# Patient Record
Sex: Male | Born: 1970 | Race: Black or African American | Hispanic: No | Marital: Single | Smoking: Current every day smoker
Health system: Southern US, Community
[De-identification: ages and names within clinical notes are randomized; demographics above are authoritative.]

## PROBLEM LIST (undated history)

## (undated) ENCOUNTER — Encounter

## (undated) ENCOUNTER — Ambulatory Visit

## (undated) ENCOUNTER — Encounter: Attending: Oncology | Primary: Oncology

## (undated) ENCOUNTER — Telehealth: Attending: Internal Medicine | Primary: Internal Medicine

## (undated) ENCOUNTER — Encounter: Attending: Hematology | Primary: Hematology

## (undated) ENCOUNTER — Ambulatory Visit: Payer: MEDICARE | Attending: Hematology | Primary: Hematology

## (undated) ENCOUNTER — Telehealth: Attending: Adult Health | Primary: Adult Health

## (undated) ENCOUNTER — Inpatient Hospital Stay

## (undated) ENCOUNTER — Ambulatory Visit: Payer: MEDICARE

## (undated) ENCOUNTER — Telehealth: Attending: Nephrology | Primary: Nephrology

## (undated) ENCOUNTER — Telehealth

## (undated) ENCOUNTER — Telehealth: Attending: Hematology | Primary: Hematology

## (undated) ENCOUNTER — Telehealth: Attending: Oncology | Primary: Oncology

## (undated) ENCOUNTER — Institutional Professional Consult (permissible substitution): Payer: MEDICARE | Attending: Hematology | Primary: Hematology

## (undated) ENCOUNTER — Ambulatory Visit: Payer: MEDICARE | Attending: Pediatrics | Primary: Pediatrics

## (undated) ENCOUNTER — Ambulatory Visit
Payer: MEDICARE | Attending: Rehabilitative and Restorative Service Providers" | Primary: Rehabilitative and Restorative Service Providers"

## (undated) ENCOUNTER — Encounter: Attending: Adult Health | Primary: Adult Health

## (undated) ENCOUNTER — Ambulatory Visit: Payer: MEDICARE | Attending: Internal Medicine | Primary: Internal Medicine

## (undated) ENCOUNTER — Ambulatory Visit: Attending: Clinical | Primary: Clinical

## (undated) ENCOUNTER — Telehealth: Attending: Critical Care Medicine | Primary: Critical Care Medicine

## (undated) ENCOUNTER — Institutional Professional Consult (permissible substitution): Payer: MEDICARE

## (undated) ENCOUNTER — Telehealth
Attending: Student in an Organized Health Care Education/Training Program | Primary: Student in an Organized Health Care Education/Training Program

## (undated) DIAGNOSIS — R569 Unspecified convulsions: Secondary | ICD-10-CM

## (undated) MED ORDER — SODIUM POLYSTYRENE SULFONATE 15 GRAM/60 ML ORAL SUSPENSION: Freq: Every day | ORAL | 0.00000 days

---
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
## 2009-01-11 ENCOUNTER — Emergency Department: Payer: Self-pay | Admitting: Emergency Medicine

## 2009-07-02 ENCOUNTER — Emergency Department: Payer: Self-pay | Admitting: Emergency Medicine

## 2010-04-26 ENCOUNTER — Ambulatory Visit: Payer: Self-pay | Admitting: Internal Medicine

## 2017-03-08 ENCOUNTER — Ambulatory Visit
Admission: RE | Admit: 2017-03-08 | Discharge: 2017-03-08 | Disposition: A | Discharge status: Home / Self Care | Payer: MEDICAID

## 2017-03-08 DIAGNOSIS — I272 Pulmonary hypertension, unspecified: Principal | ICD-10-CM

## 2017-03-08 DIAGNOSIS — D571 Sickle-cell disease without crisis: Secondary | ICD-10-CM

## 2017-03-08 DIAGNOSIS — I50810 Right heart failure, unspecified: Secondary | ICD-10-CM

## 2017-04-01 ENCOUNTER — Ambulatory Visit: Admission: RE | Admit: 2017-04-01 | Discharge: 2017-04-01 | Disposition: A | Discharge status: Home / Self Care

## 2017-04-01 DIAGNOSIS — R0602 Shortness of breath: Secondary | ICD-10-CM

## 2017-04-01 DIAGNOSIS — D571 Sickle-cell disease without crisis: Principal | ICD-10-CM

## 2017-04-01 DIAGNOSIS — R569 Unspecified convulsions: Secondary | ICD-10-CM

## 2017-04-01 DIAGNOSIS — E559 Vitamin D deficiency, unspecified: Secondary | ICD-10-CM

## 2017-05-19 ENCOUNTER — Ambulatory Visit
Admission: RE | Admit: 2017-05-19 | Discharge: 2017-05-19 | Disposition: A | Discharge status: Home / Self Care | Payer: MEDICAID | Admitting: Internal Medicine

## 2017-05-19 DIAGNOSIS — R809 Proteinuria, unspecified: Principal | ICD-10-CM

## 2017-07-01 ENCOUNTER — Ambulatory Visit
Admission: RE | Admit: 2017-07-01 | Discharge: 2017-07-01 | Disposition: A | Discharge status: Home / Self Care | Payer: MEDICAID | Admitting: Pulmonary Disease

## 2017-07-01 DIAGNOSIS — I2721 Secondary pulmonary arterial hypertension: Secondary | ICD-10-CM

## 2017-07-01 DIAGNOSIS — I50812 Chronic right heart failure: Principal | ICD-10-CM

## 2017-07-01 DIAGNOSIS — D571 Sickle-cell disease without crisis: Secondary | ICD-10-CM

## 2017-07-18 ENCOUNTER — Ambulatory Visit
Admission: RE | Admit: 2017-07-18 | Discharge: 2017-07-18 | Disposition: A | Discharge status: Home / Self Care | Payer: MEDICAID

## 2017-07-20 MED ORDER — HYDROXYUREA 500 MG CAPSULE
ORAL_CAPSULE | 1 refills | 0 days | Status: CP
Start: 2017-07-20 — End: 2017-08-10

## 2017-08-10 ENCOUNTER — Ambulatory Visit
Admission: RE | Admit: 2017-08-10 | Discharge: 2017-08-10 | Disposition: A | Discharge status: Home / Self Care | Payer: MEDICAID | Attending: Critical Care Medicine | Admitting: Critical Care Medicine

## 2017-08-10 DIAGNOSIS — D571 Sickle-cell disease without crisis: Principal | ICD-10-CM

## 2017-08-10 MED ORDER — HYDROXYUREA 500 MG CAPSULE
ORAL_CAPSULE | 1 refills | 0 days | Status: CP
Start: 2017-08-10 — End: 2017-10-21

## 2017-08-10 MED ORDER — FOLIC ACID 1 MG TABLET
ORAL_TABLET | Freq: Every day | ORAL | 3 refills | 0.00000 days | Status: CP
Start: 2017-08-10 — End: 2017-08-17

## 2017-08-17 ENCOUNTER — Ambulatory Visit
Admission: RE | Admit: 2017-08-17 | Discharge: 2017-08-17 | Disposition: A | Discharge status: Home / Self Care | Payer: MEDICAID

## 2017-08-17 DIAGNOSIS — G40209 Localization-related (focal) (partial) symptomatic epilepsy and epileptic syndromes with complex partial seizures, not intractable, without status epilepticus: Secondary | ICD-10-CM

## 2017-08-17 DIAGNOSIS — E559 Vitamin D deficiency, unspecified: Secondary | ICD-10-CM

## 2017-08-17 DIAGNOSIS — R569 Unspecified convulsions: Principal | ICD-10-CM

## 2017-08-17 DIAGNOSIS — D571 Sickle-cell disease without crisis: Secondary | ICD-10-CM

## 2017-10-03 ENCOUNTER — Ambulatory Visit: Admit: 2017-10-03 | Discharge: 2017-10-04 | Payer: MEDICARE

## 2017-10-03 DIAGNOSIS — D571 Sickle-cell disease without crisis: Principal | ICD-10-CM

## 2017-10-07 ENCOUNTER — Ambulatory Visit: Admit: 2017-10-07 | Discharge: 2017-10-08 | Payer: MEDICARE

## 2017-10-07 DIAGNOSIS — I2721 Secondary pulmonary arterial hypertension: Principal | ICD-10-CM

## 2017-10-07 DIAGNOSIS — I50812 Chronic right heart failure: Secondary | ICD-10-CM

## 2017-10-07 DIAGNOSIS — D571 Sickle-cell disease without crisis: Secondary | ICD-10-CM

## 2017-10-21 MED ORDER — HYDROXYUREA 500 MG CAPSULE
ORAL_CAPSULE | 11 refills | 0 days | Status: CP
Start: 2017-10-21 — End: 2017-11-11

## 2017-11-11 ENCOUNTER — Ambulatory Visit: Admit: 2017-11-11 | Discharge: 2017-11-12 | Payer: MEDICARE | Attending: Pediatrics | Primary: Pediatrics

## 2017-11-11 DIAGNOSIS — R569 Unspecified convulsions: Secondary | ICD-10-CM

## 2017-11-11 DIAGNOSIS — M25552 Pain in left hip: Secondary | ICD-10-CM

## 2017-11-11 DIAGNOSIS — I272 Pulmonary hypertension, unspecified: Principal | ICD-10-CM

## 2017-11-11 DIAGNOSIS — D571 Sickle-cell disease without crisis: Secondary | ICD-10-CM

## 2018-02-15 MED ORDER — PHENYTOIN SODIUM EXTENDED 100 MG CAPSULE
ORAL_CAPSULE | Freq: Two times a day (BID) | ORAL | 11 refills | 0 days | Status: CP
Start: 2018-02-15 — End: 2018-11-29

## 2018-03-01 ENCOUNTER — Ambulatory Visit: Admit: 2018-03-01 | Discharge: 2018-03-02 | Payer: MEDICARE

## 2018-03-01 DIAGNOSIS — G40209 Localization-related (focal) (partial) symptomatic epilepsy and epileptic syndromes with complex partial seizures, not intractable, without status epilepticus: Principal | ICD-10-CM

## 2018-03-01 MED ORDER — CARBAMAZEPINE ER 400 MG TABLET,EXTENDED RELEASE,12 HR
ORAL_TABLET | Freq: Two times a day (BID) | ORAL | 11 refills | 0 days | Status: CP
Start: 2018-03-01 — End: 2019-01-10

## 2018-03-02 ENCOUNTER — Ambulatory Visit: Admit: 2018-03-02 | Discharge: 2018-03-03 | Payer: MEDICARE | Attending: Pediatrics | Primary: Pediatrics

## 2018-03-02 ENCOUNTER — Ambulatory Visit: Admit: 2018-03-02 | Discharge: 2018-03-03 | Payer: MEDICARE

## 2018-03-02 DIAGNOSIS — D649 Anemia, unspecified: Principal | ICD-10-CM

## 2018-03-02 DIAGNOSIS — D571 Sickle-cell disease without crisis: Principal | ICD-10-CM

## 2018-03-02 DIAGNOSIS — M25552 Pain in left hip: Secondary | ICD-10-CM

## 2018-04-14 ENCOUNTER — Ambulatory Visit: Admit: 2018-04-14 | Discharge: 2018-04-15 | Payer: MEDICARE

## 2018-04-14 DIAGNOSIS — I2721 Secondary pulmonary arterial hypertension: Principal | ICD-10-CM

## 2018-04-14 DIAGNOSIS — I50812 Chronic right heart failure: Secondary | ICD-10-CM

## 2018-04-14 DIAGNOSIS — R0902 Hypoxemia: Secondary | ICD-10-CM

## 2018-04-14 DIAGNOSIS — D571 Sickle-cell disease without crisis: Secondary | ICD-10-CM

## 2018-07-04 ENCOUNTER — Ambulatory Visit: Admit: 2018-07-04 | Discharge: 2018-07-05 | Payer: MEDICARE

## 2018-07-04 DIAGNOSIS — I272 Pulmonary hypertension, unspecified: Principal | ICD-10-CM

## 2018-07-04 DIAGNOSIS — I2721 Secondary pulmonary arterial hypertension: Principal | ICD-10-CM

## 2018-07-04 DIAGNOSIS — D571 Sickle-cell disease without crisis: Secondary | ICD-10-CM

## 2018-07-12 ENCOUNTER — Ambulatory Visit: Admit: 2018-07-12 | Discharge: 2018-07-13 | Payer: MEDICARE

## 2018-07-12 DIAGNOSIS — I272 Pulmonary hypertension, unspecified: Secondary | ICD-10-CM

## 2018-07-12 DIAGNOSIS — G40209 Localization-related (focal) (partial) symptomatic epilepsy and epileptic syndromes with complex partial seizures, not intractable, without status epilepticus: Principal | ICD-10-CM

## 2018-07-12 DIAGNOSIS — E559 Vitamin D deficiency, unspecified: Secondary | ICD-10-CM

## 2018-09-22 ENCOUNTER — Ambulatory Visit: Admit: 2018-09-22 | Discharge: 2018-09-25 | Disposition: A | Discharge status: Home / Self Care | Payer: MEDICARE

## 2018-09-22 DIAGNOSIS — I509 Heart failure, unspecified: Principal | ICD-10-CM

## 2018-09-23 DIAGNOSIS — I509 Heart failure, unspecified: Principal | ICD-10-CM

## 2018-09-24 DIAGNOSIS — I509 Heart failure, unspecified: Principal | ICD-10-CM

## 2018-09-25 DIAGNOSIS — I509 Heart failure, unspecified: Principal | ICD-10-CM

## 2018-09-29 ENCOUNTER — Ambulatory Visit: Admit: 2018-09-29 | Discharge: 2018-09-30 | Payer: MEDICARE

## 2018-09-29 DIAGNOSIS — D571 Sickle-cell disease without crisis: Secondary | ICD-10-CM

## 2018-09-29 DIAGNOSIS — I272 Pulmonary hypertension, unspecified: Secondary | ICD-10-CM

## 2018-09-29 DIAGNOSIS — I2721 Secondary pulmonary arterial hypertension: Principal | ICD-10-CM

## 2018-10-19 ENCOUNTER — Ambulatory Visit: Admit: 2018-10-19 | Discharge: 2018-10-20 | Payer: MEDICARE

## 2018-10-19 DIAGNOSIS — D571 Sickle-cell disease without crisis: Principal | ICD-10-CM

## 2018-11-29 MED ORDER — PHENYTOIN SODIUM EXTENDED 100 MG CAPSULE
ORAL_CAPSULE | Freq: Two times a day (BID) | ORAL | 11 refills | 0.00000 days | Status: CP
Start: 2018-11-29 — End: 2018-11-30

## 2018-11-30 MED ORDER — PHENYTOIN SODIUM EXTENDED 100 MG CAPSULE: 200 mg | capsule | Freq: Two times a day (BID) | 4 refills | 0 days | Status: AC

## 2018-11-30 MED ORDER — PHENYTOIN SODIUM EXTENDED 100 MG CAPSULE
ORAL_CAPSULE | Freq: Two times a day (BID) | ORAL | 11 refills | 0.00000 days | Status: CP
Start: 2018-11-30 — End: 2018-11-30

## 2018-12-06 ENCOUNTER — Ambulatory Visit
Admit: 2018-12-06 | Discharge: 2018-12-13 | Disposition: A | Discharge status: Home Health | Payer: MEDICARE | Admitting: Hospitalist

## 2018-12-06 ENCOUNTER — Ambulatory Visit
Admit: 2018-12-06 | Discharge: 2018-12-13 | Disposition: A | Discharge status: Home Health | Payer: MEDICARE | Attending: Hematology | Admitting: Hospitalist

## 2018-12-06 DIAGNOSIS — D571 Sickle-cell disease without crisis: Principal | ICD-10-CM

## 2018-12-08 DIAGNOSIS — D571 Sickle-cell disease without crisis: Principal | ICD-10-CM

## 2018-12-08 NOTE — Unmapped (Addendum)
[name] [name] is a 48 y/o male with sickle cell anemia, pulmonary HTN, high output failure and seizures who presnted to hematology clinic and found to be hypoxemic and had a Hgb of 5.4.  He is now stable for dc home with home health for ongoing teaching for home admin of SQ epo and follow up with hematology in clinic.  Below are more details of his stay at [facility] according to problem:   ??  Hypoxemia (resolved)- Noted to be 86% at clinic requiring 2L [place] without any respiratory symptoms other than occasional cough. COVID/RPP negative. CXR without effusion, infiltrates or consolidation. Low suspicion for acute chest or infection. He does have a history of pulmonary HTN and high output overload but no edema on CXR and elevated probnp that is down from prior admission. EKG without acute ST,T wave changes and no elevated troponin. Likely due to anemia with little reserve, possible shunting and mild volume overload. Was monitored during stay and not found to have hypoxia with ABG re-assuring.      Sickle cell disease/Anemia- Hgb on presentation of 5.4, baseline Hgb noted to be 6 but has run in low 5s previously, RI <2 indicating hypoproliferation.  LDH (~4,700)  and bilirubin (~5) are at baseline. Denies hematochezia, melena, chest pain and imaging is not concerning for acute chest. Currently pain is controlled. Planned transfusion for 2 U pRBCs, with close volume reassessment and possible diuresis. However, blood bank notified that he is positive to all antibodies so will have to get blood for [facility] which will take up to 2 days. Given antibodies and poor long-term solution Hematology felt higher dose EPO should be tried to help with counts.  He was taught per RN staff how to admin for home and Hgb monitored with increase to 6.1 noted.  Will get EPO thru [facility] shared services and was provided with syringes/needles and HH will see to con Continued folate and currently not on hydrea.  Will follow up in clinic with hematology/sickle cell.   ??  Seizure Disorder  Continue home tegretol 800 mg BID and Dilantin 200 mg BID   ??  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 Reports stable weight, no LE edema.??Resolved hypoxemia as above.  Fluid status assessment with PCP and hematology follow up.   ??  Hyperbilirubinemia: at baseline bilirubin ~5 with I>D, most likely 2/2 hemolysis and trended up to 6.2 on day of dc with Hgb up to 6 as noted above.  Continue outpatient monitoring.     Weakness/diplopia/Vertigo/Nausea- had intermittent symptoms during stay and there was some c/f stroke so MRI done w/o stroke noted. Utox negative.   Etiology is unclear and could be due to medication effect as Epogen can cause n/v and dizziness.  Less likely to cause diplopia.  Also, thought it could be antiepileptics but these are not new medications for him and it appears that he is filling scripts and levels normal.  His symptoms were resolved on day of dc and will provide promethazine for prn nausea for home use.  Continue to monitor for symptoms with PCP/hematology follow up and consider medication effects if ongoing.

## 2018-12-08 NOTE — Unmapped (Addendum)
Blood Bank Immunohematology Evaluation    Arville Lime types as B POS and has no history of red blood cell antibodies. In the patient???s sample from 12/06/18, a broad specificity antibody is identified.     Testing in this facility demonstrated panreactivity with all cells tested including a reagent cell phenotypically matched to the patient.  Testing the patient's cells with his own plasma showed no reactivity (negative autocontrol). A direct antiglobulin test (DAT) was also negative.  All red blood cells crossmatched for transfusion were incompatible.    Specimens were sent to the immunohematology reference laboratory with similar findings.  Multiple additional testing methods were used but the antibody was not identified.  The preliminary report states that a broad specificity antibody demonstrated with all cells and antibodies to antigens of high incidence cannot be ruled out.    The patient had a negative antibody screen on 09/23/18 and was transfused one unit of phenotypically matched red blood cells.  A new antibody in the setting of recent transfusion is concerning for a possible delayed hemolytic transfusion reaction.      Recommendations: Transfusion should be avoided if at all possible. If transfusion is required, the least incompatible units which are phenotypically matched to the patient???s Red Blood Cell antigens will be provided.      A emergency release card must be signed by the treating physician to release the Red Blood Cell units from Transfusion Medicine Service for transfusion.     During and after the transfusion, the patient should be monitored for the potential of hemolysis.    Clerance Lav, MD  Transfusion Medicine Attending    Addendum note: Additional results from reference laboratory for RH variants testing.    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 These alleles are predicted to encode Partial D antigens and are associated with allo anti-D antibodies. GATA - 1 mutation is also identified. This results in loss of Fyb expression on RBCs. These individuals are not expected to make anti-Fyb but anti-Fy3 has been reported.     Recommendations: Transfusion should be avoided if at all possible. If transfusion is necessary, he will receive least incompatible phenotypically matched, D-antigen negative RBC units.     A conditional release card must be signed by the treating physician to release the Red Blood Cell units from Transfusion Medicine Service for transfusion.    Mirna Mires, MD  Transfusion Medicine Fellow    Transfusion Medicine Attending Attestation:  I independently reviewed the RBC antibody work-up and agree with the interpretation and plan as documented in the Resident's note.  For questions regarding the antibody interpretation, contact the Transfusion Medicine Service.      Jearld Shines, MD  Transfusion Medicine Service

## 2018-12-08 NOTE — Unmapped (Signed)
Medicine Daily Progress Note    Assessment/Plan:  Principal Problem:    Hypoxemia  Active Problems:    Sickle cell anemia (CMS-HCC)    Pulmonary HTN (CMS-HCC)    Anemia    Tobacco use disorder  Resolved Problems:    * No resolved hospital problems. *           Arville Lime is a 48 y.o. male with a history of hemoglobin SS disease, pulmonary pretension, high-output heart failure and seizure disorder who presented to [facility] from clinic with hypoxemia of uncertain etiology and severe acute on chronic anemia.     Hypoxemia: No obvious etiology but unremarkable chest x-ray suggests a more limited differential which includes dynamic shunting (as suggested by hematology), pulmonary embolism and pulmonary hypertension.  That said, chest x-ray is not perfectly sensitive and although his proBNP was not as high as it has been in the past, and is near the high end of his range and one higher prior value was in the setting of admission for hypoxemia with a discharge diagnosis of heart failure exacerbation.  If, as is suspected, his heart failure is a result of high-output secondary to severe anemia - current presentation with exacerbation of high-output heart failure related to acute on chronic severe anemia would provide a parsimonious explanation.  - Follow up bubble study  - Consider cautious diuresis  - Nightly nasal cannula  - Incentive spirometry  - Monitor SpO2    Anemia: Severe acute on chronic anemia with down-trending hemoglobin, potentially attributable to delayed hemolytic transfusion reaction. No evidence of bleeding, and no reason to suspect new marrow suppressive insult, though note his antiepileptics can be associated with anemia/cytopenias. EPO analogue initiated but will likely take more time to see any response.  - Hematology consulted  - Retacrit 40,000 units   - Ferrlicit 125mg    - Limit blood draws  - Daily CBC with diff  - Daily reticulocyte count    Pulmonary hypertension: Associated with high output heart failure, likely exacerbated by acute on chronic severe anemia. No obvious volume overload on exam.  - Follow up echo (bubble study)  - Daily weights  - Strict intake/output    Seizure disorder:   - Carbamazepine 800mg  BID  - Phenytoin 200mg  BID    FEN/GI/PPx:  Diet - regular  IVF - none  Replete electrolytes PRN  GI - none  VTE - enoxaparin 40mg     Code status: Full  Dispo: Med U, floor status  Access: PIV  ___________________________________________________________________    Subjective:  Patient reports feeling well.  He denies dyspnea or cough.  Mild lightheadedness upon standing, but denies any significant exertional intolerance.  Expressed frustration about ongoing hospitalization, and inquired about what stood in the way of discharge.    Labs/Studies:  Labs and Studies from the last 24hrs per EMR and Reviewed       Objective:  Temp:  [36 ??C-36.9 ??C] 36.5 ??C  Heart Rate:  [66-70] 66  Resp:  [16-18] 16  BP: (104-107)/(50-55) 105/50  SpO2:  [92 %-96 %] 95 %    GEN - Thin man lying comfortably in bed.  HEENT - MMM.  No oral lesions.  Conjunctival icterus present.  CV - Normal rate, regular.  Distal pulses intact.  No JVD.  PULM - Normal WOB on room air.  Adequate air movement.  No cough.  Clear bilaterally.  ABD - Non-distended.  EXT - Warm. No edema.  NEURO - Alert and oriented.  Normal  speech and language.  Slight left facial droop.  SKIN - Warm and dry.  No obvious rash on clothed exam.

## 2018-12-09 NOTE — Unmapped (Signed)
Medicine Daily Progress Note    Assessment/Plan:  Principal Problem:    Hypoxemia  Active Problems:    Sickle cell anemia (CMS-HCC)    Pulmonary HTN (CMS-HCC)    Anemia    Tobacco use disorder  Resolved Problems:    * No resolved hospital problems. *           [name] [name] is a 48 y.o. male with a history of hemoglobin SS disease, pulmonary pretension, high-output heart failure and seizure disorder who presented to [facility] from clinic with hypoxemia of uncertain etiology and severe acute on chronic anemia.     Hypoxemia: No obvious etiology but unremarkable chest x-ray suggests a more limited differential which includes dynamic shunting (though negative bubble study makes this highly unlikely), pulmonary embolism and pulmonary hypertension.  That said, chest x-ray is not perfectly sensitive and although his proBNP was not as high as it has been in the past, and is near the high end of his range and one higher prior value was in the setting of admission for hypoxemia with a discharge diagnosis of heart failure exacerbation.  If, as is suspected, his heart failure is a result of high-output secondary to severe anemia - current presentation with exacerbation of high-output heart failure related to acute on chronic severe anemia would provide a parsimonious explanation.  - Oral furosemide 20mg   - Nightly nasal cannula  - Incentive spirometry  - Monitor SpO2    Sickle cell anemia: Severe acute on chronic anemia with down-trending hemoglobin, potentially attributable to delayed hemolytic transfusion reaction though timeframe seems somewhat more delayed than would be expected. No evidence of bleeding, and no reason to suspect new marrow suppressive insult, though note his antiepileptics can be associated with anemia/cytopenias. EPO analogue initiated but unclear if any response yet.  - Hematology consulted  - Further EPO and IV iron per hematology  - Limit blood draws  - Daily CBC with diff  - Daily reticulocyte count    Pulmonary hypertension: Associated with high output heart failure, likely exacerbated by acute on chronic severe anemia. No significant volume overload evident on exam and CXR was unremarkable but proBNP is as high as it has ever been with the exception of admission 08/2018 for hypoxemia and heart failure.  - Daily weights  - Strict intake/output    Seizure disorder:   - Carbamazepine 800mg  BID  - Phenytoin 200mg  BID    Constipation: 17g miralax and two tab senna    FEN/GI/PPx:  Diet - regular  IVF - none  Replete electrolytes PRN  GI - none  VTE - enoxaparin 40mg     Code status: Full  Dispo: Med U, floor status  Access: PIV  ___________________________________________________________________    Subjective:  Patient eloped yesterday but elected to return to the hospital after conversation with his outpatient hematologist. He reports feeling sick and woozy today, and did have some lightheadedness last night. He has had a little bit of nausea but no vomiting. He has not had a bowel movement since 4/15, which is unusual for him.    Labs/Studies:  Labs and Studies from the last 24hrs per EMR and Reviewed       Objective:  Temp:  [36.5 ??C-37.6 ??C] 37.6 ??C  Heart Rate:  [66-72] 69  Resp:  [16-18] 18  BP: (105-119)/(50-59) 116/58  SpO2:  [95 %] 95 %    GEN - Thin man lying comfortably in bed, gown open in the front.  HEENT - MMM.  No  oral lesions.  Conjunctival icterus present.  CV - Normal rate, regular.  Distal pulses intact.  No appreciable JVD.  PULM - Normal WOB on room air.  Adequate air movement.  No cough.  Clear bilaterally.  ABD - Non-distended.  Non-tender.  Active bowel sounds.  EXT - Warm. Trace bilateral pitting edema.  NEURO - Alert and oriented.  Normal speech and language.  Slight left facial droop.  Moving all extremities freely.  SKIN - Warm and dry.  No obvious rash on clothed exam.

## 2018-12-10 NOTE — Unmapped (Signed)
Medicine Daily Progress Note    Assessment/Plan:  Principal Problem:    Hypoxemia  Active Problems:    Sickle cell anemia (CMS-HCC)    Pulmonary HTN (CMS-HCC)    Anemia    Tobacco use disorder  Resolved Problems:    * No resolved hospital problems. *           [name] [name] is a 48 y.o. male with a history of hemoglobin SS disease, pulmonary pretension, high-output heart failure and seizure disorder who presented to [facility] from clinic with hypoxemia of uncertain etiology and severe acute on chronic anemia.     Hypoxemia: No obvious etiology and despite unremarkable chest x-ray upon presentation, favor exacerbation of high-output heart failure as a result of his acute on chronic severe anemia, with proBNP at the high end of his range and seemingly a positive response to gentle diuresis.  - Nightly nasal cannula  - Incentive spirometry  - Monitor SpO2    Sickle cell anemia: Severe acute on chronic anemia with down-trending hemoglobin, potentially attributable to delayed hemolytic transfusion reaction though timeframe seems somewhat more delayed than would be expected.  EPO analogue initiated, hopeful that slight improvement in hemoglobin (4.5 to 5.1) is reflective of a response and we will continue to see improvement.  - Hematology consulted  - Further EPO per hematology  - Limit blood draws  - Daily CBC  - Daily reticulocyte count    Pulmonary hypertension: Associated with high output heart failure, likely exacerbated by acute on chronic severe anemia. No significant volume overload evident on exam and CXR was unremarkable but proBNP is as high as it has ever been with the exception of admission 08/2018 for hypoxemia and heart failure.  - Daily weights  - Strict intake/output    Seizure disorder: carbamazepine 800mg  BID, phenytoin 200mg  BID  Constipation: PRN miralax and senna    FEN/GI/PPx:  Diet - regular  IVF - none  Replete electrolytes PRN  GI - none  VTE - enoxaparin 40mg     Code status: Full  Dispo: Med U, floor status  Access: PIV  ___________________________________________________________________    Subjective:  Patient had an episode of lightheadedness and presyncope when walking to the bathroom last night, so sat down on the floor in a controlled manner to avoid falling. Patient relates this to a lack of food and long wait times for food/drink in the hospital, and expressed continued frustration at the length of time that he has been required to stay in the hospital.    Labs/Studies:  Labs and Studies from the last 24hrs per EMR and Reviewed       Objective:  Temp:  [37.3 ??C-37.6 ??C] 37.3 ??C  Heart Rate:  [66-72] 66  Resp:  [18-20] 18  BP: (111-118)/(55-62) 111/55  SpO2:  [92 %-95 %] 92 %    GEN - Thin man lying in bed at ~45deg, watching TV.  HEENT - MMM.  No oral lesions.  Conjunctival icterus present.  CV - Normal rate, regular.  Distal pulses intact.  No appreciable JVD.  PULM - Normal WOB on room air.  Adequate air movement.  No cough.  Clear bilaterally.  ABD - Non-distended.  Non-tender.  Active bowel sounds.  EXT - Warm. Trace bilateral pitting edema.  NEURO - Alert and oriented.  Normal speech and language.  Slight left facial droop.  Moving all extremities freely.  SKIN - Warm and dry.  No obvious rash on clothed exam.

## 2018-12-11 DIAGNOSIS — D571 Sickle-cell disease without crisis: Principal | ICD-10-CM

## 2018-12-11 NOTE — Unmapped (Signed)
Benign Hematology/Coagulation Consult Follow Up Note    Primary Attending Physician:  Vonda Antigua, MD  Primary Service: Med General [facility] ([facility])    THIS CONSULT WAS COMPLETED VIRTUALLY IN THE SETTING OF COVID-19 PANDEMIC, WITH THE GOAL OF REDUCING TRANSMISSION AND LIMITING PPE USE.    Assessment:   [name] is a 48 y.o. 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  ??  In the ED, evaluation was notable for hypoxia requiring 2 L [place], CXR with clear lungs.    ??  Notably, anemia alone (while it can drive dyspnea) does not explain hypoxia.  There are a number of mechanisms by which sickle cell can be associated with hypoxia.  In [name]' case, we would be concerned above volume overload (given known pulmonary hypertension, elevated BNP) though CXR is relatively clear from an edema perspective.  Fortunately, his CXR is without evidence of acute chest as well.  COVID testing was sent in the ED and negative.  Patients with sickle cell can also develop dynamic shunting phenomena, which may or may not be responsive to transfusion / increased hemoglobin.  A shunt study performed during this admission was negative, and an ABG was obtained demonstrating true hypoxia.    Unfortunately, this is a recurring problem for [name], and based on conversations with the primary team and blood bank, compatible blood products are limited.  Consequently, our initial strategy of serial transfusion support may not be viable.  Alternatively, we could attempt support (and potentially long term support) with EPO analogues.    Blood bank evaluation with broadly specific anti-RBC antibody identified though with negative DAT and negative auto-control reactivity.  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  Unfortunately, transfusion can exacerbate this process, and we will have to rely on erythropoiesis  stimulating agents.    Overnight into 4/20, patient had new difficulties with dizziness and nausea despite stability to improvement in his hemoglobin.  ??  Recommendations:  ??  ?? Agree with re-evaluation for non-hematologic etiologies of new symptoms per primary team.  ?? Given difficulties with transfusion, long term transfusion regimen will likely not be feasible.  ? Would NOT transfuse blood product at present without discussion with hematology.  ?? Would give Retacrit 40,000 units today and monitor CBC and reticulocyte count in the coming days.  ?? Plan for discharge with twice weekly Epo support when able.  ? We are exploring coverage for Retacrit 20,000 units to be injected at home twice weekly  ? Patient will requiring teaching on administration, as we expect him to need home administration.  ?? Monitor CBC and reticulocyte count daily.  ?? Hydrea: Off.  Previously not tolerated due to neutropenia  ?? Strongly encourage ongoing ICS use while hospitalized  ?? Recommend pharmacologic VTE prophylaxis       This patient was discussed with Dr. Janie Morning. These recommendations were relayed to the primary team by phone.      We will continue to follow. Please contact the Coagulation/Benign Hematology Fellow: [phone] with any further questions.    10:37 AM 12/11/2018   Laurian Brim, MD  Hematology and Oncology Fellow         -------------------------------------------------------------    Subjective:    - Patient transiently  eloped on Friday  - Patient with improved hypoxia, satting in 90s with exertion per primary team  - Given iron and Retacrit on Friday  - Hemoglobin in the low to mid 5s over weekend    Called patient to discuss symptoms.  He reports that he is newly dizzy, which he first noticed overnight last night.  He characterizes this as a sensation which he thinks is related to getting his medication later in the day.  He thinks he is unstable on his feet.   He denies vertigo.   He denies other new symptoms, though I note complaints of nausea overnight.      Objective:   Vitals: Temp:  [36.2 ??C (97.2 ??F)-37 ??C (98.6 ??F)] 36.2 ??C (97.2 ??F)  Heart Rate:  [57-66] 60  Resp:  [18] 18  BP: (110-128)/(55-62) 126/60  MAP (mmHg):  [71-86] 86  SpO2:  [92 %-98 %] 96 %        Test Results  Recent Labs     12/09/18  0905 12/10/18  0806 12/11/18  0830   WBC 10.4 9.6 9.8   HGB 5.1* 5.1* 5.3*   PLT 162 200 188       Current medication list reviewed.

## 2018-12-12 DIAGNOSIS — D571 Sickle-cell disease without crisis: Principal | ICD-10-CM

## 2018-12-12 LAB — TOXICOLOGY SCREEN, URINE
BARBITURATE SCREEN URINE: <200
BENZODIAZEPINE SCREEN, URINE: <200
CANNABINOID SCREEN URINE: <20
METHADONE SCREEN, URINE: <300
OPIATE SCREEN URINE: <300

## 2018-12-12 LAB — CBC
HEMATOCRIT: 16.4 % — ABNORMAL LOW (ref 41.0–53.0)
HEMOGLOBIN: 5.1 g/dL — ABNORMAL LOW (ref 13.5–17.5)
MEAN CORPUSCULAR HEMOGLOBIN: 35.9 pg — ABNORMAL HIGH (ref 26.0–34.0)
MEAN CORPUSCULAR VOLUME: 115.3 fL — ABNORMAL HIGH (ref 80.0–100.0)
MEAN PLATELET VOLUME: 10.2 fL — ABNORMAL HIGH (ref 7.0–10.0)
NUCLEATED RED BLOOD CELLS: 40 /100{WBCs} — ABNORMAL HIGH (ref <=4)
PLATELET COUNT: 181 10*9/L (ref 150–440)
PLATELET COUNT: 181 10*9/L — ABNORMAL HIGH (ref 150–440)
RED BLOOD CELL COUNT: 1.43 10*12/L — ABNORMAL LOW (ref 4.50–5.90)
RED CELL DISTRIBUTION WIDTH: 24.5 % — ABNORMAL HIGH (ref 12.0–15.0)
WBC ADJUSTED: 11 10*9/L (ref 4.5–11.0)

## 2018-12-12 LAB — RETICULOCYTES
RETIC COUNT, MANUAL: 16.5 % — ABNORMAL HIGH (ref 0.8–2.0)
RETICULOCYTE ABSOLUTE COUNT, MANUAL: 250.8 10*9/L — ABNORMAL HIGH (ref 27.0–120.0)

## 2018-12-12 MED ORDER — EPOETIN ALFA 20,000 UNIT/ML INJECTION SOLUTION: 20000 [IU] | mL | 2 refills | 42 days | Status: AC

## 2018-12-12 MED ORDER — EPOETIN ALFA 20,000 UNIT/ML INJECTION SOLUTION
SUBCUTANEOUS | 2 refills | 42.00000 days | Status: CP
Start: 2018-12-12 — End: 2019-03-08
  Filled 2018-12-15: qty 12, 42d supply, fill #0

## 2018-12-12 MED ORDER — SYRINGE WITH NEEDLE, SAFETY 1 ML 25 GAUGE X 5/8"
2 refills | 0 days | Status: CP
Start: 2018-12-12 — End: 2018-12-13
  Filled 2018-12-13: qty 8, 28d supply, fill #0

## 2018-12-12 NOTE — Unmapped (Signed)
Medicine Daily Progress Note    Assessment/Plan:  Principal Problem:    Anemia  Active Problems:    Sickle cell anemia (CMS-HCC)    Seizures (CMS-HCC)    Pulmonary HTN (CMS-HCC)    Hypoxemia    Tobacco use disorder    Diplopia    High output heart failure (CMS-HCC)    Vertigo    Hyperbilirubinemia    Nausea & vomiting  Resolved Problems:    * No resolved hospital problems. *           [name] [name] is a 48 y.o. male with a history of hemoglobin SS disease, pulmonary pretension, high-output heart failure and seizure disorder who presented to [facility] from clinic with hypoxemia of uncertain etiology and severe acute on chronic anemia, and has since developed vertigo, diplopia, nausea and subjective weakness despite normal vitals, negative orthostatics and improved/stable hemoglobin.    Dizziness/subjective weakness/nausea: Uncertain cause of his new onset symptoms in the absence of appreciable neurologic deficit on exam with stable vital signs, otherwise unremarkable general exam and stable to improving hemoglobin, as well as negative troponin x 2 and EKG at baseline.  - Orthostatic vitals  - Hematologic management per below    - Monitor symptoms  - AED levels  - Urine toxicology screen    Sickle cell anemia: Severe acute on chronic anemia with down-trending hemoglobin, potentially attributable to delayed hemolytic transfusion reaction though timeframe seems somewhat more delayed than would be expected.  Seems to be having a positive response to EPO analogue administration, with continued slow increase in hemoglobin and jump in reticulocyte count today.  - Further EPO per hematology  - Limit blood draws  - Daily CBC  - Daily reticulocyte count    Hypoxemia: No obvious etiology and despite unremarkable chest x-ray upon presentation, favor exacerbation of high-output heart failure as a result of his acute on chronic severe anemia, with proBNP at the high end of his range and seemingly a positive response to gentle diuresis.  - Nightly nasal cannula  - Incentive spirometry  - Monitor SpO2  - Repeat proBNP    Hyperbilirubinemia: No clear biochemical signal toward hepatobiliary pathology but in association with nausea and upper abdominal symptoms, as well as malaise, fatigue and generally rather non-specific onset of symptomatology (questionable neurologic deficits notwithstanding) described above, this could be considered.  Alternative explanations to consider include, of course, sickle cell disease related hemolysis +/- a DHTR.  Congestive hepatopathy as a result of his high output heart failure could also be considered but is less likely for the same reasons as described above.  - Hematologic evaluation/management per above    Pulmonary hypertension: Associated with high output heart failure, likely exacerbated by acute on chronic severe anemia. No significant volume overload evident on exam and CXR was unremarkable but proBNP is as high as it has ever been with the exception of admission 08/2018 for hypoxemia and heart failure.  - Daily weights  - Strict intake/output    Seizure disorder: carbamazepine 800mg  BID, phenytoin 200mg  BID  Constipation: PRN miralax and senna    FEN/GI/PPx:  Diet - regular  IVF - none  Replete electrolytes PRN  GI - none  VTE - enoxaparin 40mg     Code status: Full  Dispo: Med U, floor status  Access: PIV  ___________________________________________________________________    Subjective:  Episode last night characterized by nausea, dizziness, unsteadiness on his feet and fatigue/malaise.  He had subjective weakness as well but no deficit on exam  at that time.  Symptoms worse upon standing but present when lying down too.  Some abdominal discomfort, more in upper abdomen but relatively unchanged.  He blames lack of movement for his symptoms, and says he has had similar self-limited episodes with similar symptoms in the past related to lack of movement.    Labs/Studies:  Labs and Studies from the last 24hrs per EMR and Reviewed       Objective:  Temp:  [35.9 ??C-36.5 ??C] 35.9 ??C  Heart Rate:  [57-69] 60  Resp:  [18] 18  BP: (106-139)/(56-65) 139/65  SpO2:  [91 %-98 %] 94 %    GEN - Thin man lying sitting in chair with legs propped up, eyes partially closed.  Making inconsistent eye contact.  HEENT - MMM.  No oral lesions.  Conjunctival icterus present.  CV - Normal rate, regular.  Distal pulses intact.  No appreciable JVD.  PULM - Normal WOB on room air.  Adequate air movement.  No cough.  Clear bilaterally.  ABD - Non-distended.  Minimally tender in upper > lower abdomen.  Active bowel sounds.  EXT - Warm. Trace bilateral pitting edema.  NEURO - Alert and oriented.  Normal speech and language.  Slight left facial droop, unchanged.  Cranial nerve testing otherwise normal.  Strength 5/5 throughout.  Negative Romberg.  Unsteady when first standing but normal gait thereafter.  SKIN - Warm and dry.  No obvious rash on clothed exam.

## 2018-12-12 NOTE — Unmapped (Signed)
Pt cooperative during shift. Despite education and bed alarm use, pt moves around in room without calling for assistance, no falls or injuries. No report of pain. In the late afternoon, pt stated he was going to leave because he didn't have an appetite for anything on the menu and was tired of sitting around. He began to put on his clothes but RN stayed in room and asked him about different foods he had tried and what could be done to help. Eventually pt asked for paper and a pencil to draw and stated he could stay a little while longer. RN brought him paper, pencil, a word puzzle book and a picture puzzle. Later during rounding, RN asked him how the activities were going. Pt stated he tried to do a word puzzle but became dizzy and his vision was blurry, MD made aware of s/s. At this time, RN was able to convince pt to order something to eat, a strawberry smoothie. RN spoke to pt's mother on the phone and gave the pt her message that she had tried to call him. VS stable. RN spoke to pt's mother on the phone and gave the pt her message that she had tried to call him. Will continue to monitor.      Problem: Adult Inpatient Plan of Care  Goal: Plan of Care Review  Outcome: Ongoing - Unchanged  Goal: Patient-Specific Goal (Individualization)  Outcome: Ongoing - Unchanged  Goal: Absence of Hospital-Acquired Illness or Injury  Outcome: Ongoing - Unchanged  Goal: Optimal Comfort and Wellbeing  Outcome: Ongoing - Unchanged  Goal: Readiness for Transition of Care  Outcome: Ongoing - Unchanged  Goal: Rounds/Family Conference  Outcome: Ongoing - Unchanged     Problem: Pain Acute  Goal: Optimal Pain Control  Outcome: Ongoing - Unchanged     Problem: Fall Injury Risk  Goal: Absence of Fall and Fall-Related Injury  Outcome: Ongoing - Unchanged

## 2018-12-12 NOTE — Unmapped (Addendum)
OCCUPATIONAL THERAPY  Evaluation (12/12/18 1125)    Patient Name:  [name] [name]       Medical Record Number: [id number]   Date of Birth: 07/04/1971  Sex: Male          OT Treatment Diagnosis:  deconditioning affecting pt's ability to complete ADLs    Assessment  Problem List: Impaired balance;Impaired ADLs;Decreased endurance;Dizziness/vertigo;Fall Risk  Assessment: [name] is a 48 y.o. man with sickle cell (SS disease, baseline hemoglobin around 6 though historically symptomatic in the low 5s), pulmonary hypertension, high output heart failure, proteinuria, epilepsy, who was seen in hematology clinic and found to be hypoxic, for which he was directed to the ED. Pt demonstrating deficits in strength and balance that affect his ability to complete ADLs, functional transfers, and functional mobility safely. Pt also demonstrating deficits in safety awareness and was impulsive throughout OT session. Pt would benefit from further cognitive assessment. Based on consideration of pt's occupational profile, assessment review, level of clinical decision making involved, and intervention plan, this pt is considered to be a moderate complexity case.  Today's Interventions: supine <> sit, sit <> stand, LB dressing, toilet transfers, grooming and oral hygiene while standng at sink, and functional mobility short in room distances. OT educating pt re: post-acute recs role of OT and OT POC.     Activity Tolerance During Today's Session  Patient tolerated treatment well    Plan  Planned Frequency of Treatment:  1-2x per day for: 3-4x week       Planned Interventions:  ADL retraining;Bed mobility;Endurance activities;Education - Family / caregiver;Home exercise program;Balance activities;Education - Patient;Adaptive equipment;Postular / Proximal stability;Neuromuscular re-education;Functional cognition;Compensatory tech. training;Conservation;Functional mobility;Positioning;Transfer training;Therapeutic exercise;Safety education;Range of motion;UE Strength / coordination exercise;Visual / perceptual tasks    Post-Discharge Occupational Therapy Recommendations:  OT Post Acute Discharge Recommendations: 5x weekly;Low intensity   OT DME Recommendations: Defer to post acute    GOALS:   Patient and Family Goals: to get stronger    Long Term Goal #1: Pt will score 24/24 on AMPAC in 4 weeks       Short Term:  Pt will complete full dressing independently + LRAD   Time Frame : 2 weeks  Pt will complete grooming independently while standing at sink + LRAD   Time Frame : 2 weeks  Pt will complete toilet tranfers independently + LRAD   Time Frame : 2 weeks  Pt will participate in further cognitive assessment (ie. MOCA, etc) in order to further assess cognition.    Time Frame : 2 weeks         Prognosis:  Good  Positive Indicators:  PLOF  Barriers to Discharge: Decreased caregiver support;Endurance deficits;Impaired Balance;Gait instability;Decreased safety awareness    Subjective  Current Status Pt recieved and left supine in bed with call bell and all needs in reach. RN aware.  Prior Functional Status Pt previously completing ADLs and IADLs independently prior to admission and used RW when he felt unsteady. Pt states he still drives and enjoys gardening.    Medical Tests / Procedures: Reviewed in Epic       Patient / Caregiver reports: Sometimes I get dizzy at home    Past Medical History:   Diagnosis Date   ??? Anemia    ??? Foot pain    ??? 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 Hypertension    ??? Pulmonary hypertension (CMS-HCC)    ??? Seizures (CMS-HCC)    ??? Sickle cell anemia (CMS-HCC)    ???  Vitamin D deficiency     Social History     Tobacco Use   ??? Smoking status: Current Some Day Smoker     Packs/day: 1.00     Years: 0.50     Pack years: 0.50     Types: Cigars   ??? Smokeless tobacco: Never Used   ??? Tobacco comment: Pt smokes 1-2 cigars daily, Pt intends to cease tobacco use    Substance Use Topics   ??? Alcohol use: Not Currently Alcohol/week: 0.0 standard drinks     Drinks per session: 1 or 2     Binge frequency: Never     Comment: occasional      Past Surgical History:   Procedure Laterality Date   ??? PR RIGHT HEART CATH O2 SATURATION & CARDIAC OUTPUT N/A 07/18/2017    Procedure: Right Heart Catheterization;  Surgeon: Zannie Cove, MD;  [facility] CATH;  Service: Cardiology    Family History   Problem Relation Age of Onset   ??? Hypertension Mother    ??? Hypertension Father    ??? No Known Problems Sister    ??? No Known Problems Brother    ??? No Known Problems Brother    ??? No Known Problems Brother    ??? No Known Problems Brother    ??? Sickle cell anemia Sister    ??? No Known Problems Sister    ??? Clotting disorder Paternal Grandmother    ??? No Known Problems Maternal Aunt    ??? No Known Problems Maternal Uncle    ??? No Known Problems Paternal Aunt    ??? No Known Problems Paternal Uncle    ??? No Known Problems Maternal Grandmother    ??? No Known Problems Maternal Grandfather    ??? No Known Problems Paternal Grandfather    ??? No Known Problems Other    ??? Seizures Neg Hx    ??? Pulmonary Hypertension Neg Hx    ??? Autoimmune disease Neg Hx    ??? Venous thrombosis Neg Hx    ??? Amblyopia Neg Hx    ??? Blindness Neg Hx    ??? Cancer Neg Hx    ??? Cataracts Neg Hx    ??? Diabetes Neg Hx    ??? Glaucoma Neg Hx    ??? Macular degeneration Neg Hx    ??? Retinal detachment Neg Hx    ??? Strabismus Neg Hx    ??? Stroke Neg Hx    ??? Thyroid disease Neg Hx         Patient has no known allergies.     Objective Findings  Precautions / Restrictions  Falls precautions    Weight Bearing  Non-applicable    Required Braces or Orthoses  Non-applicable    Pain  no c/o pain    Equipment / Environment  Vascular access (PIV, TLC, Port-a-cath, PICC)    Living Situation  Living Environment: Trailer  Lives With: Alone(his parents live a few houses down and could be available to help if needed)  Home Living: One level home;Stairs to enter with rails;Tub/shower unit;Standard height toilet  Rail placement (outside): Bilateral rails  Number of Stairs: 3     Cognition   Orientation Level:  Oriented x 4   Arousal/Alertness:  Delayed responses to stimuli   Attention Span:  Appears intact   Memory:  Unable to assess   Following Commands:  Follows multistep commands with increased time   Safety Judgment:  Decreased awareness of need for assistance;Decreased awareness  of need for safety    Comments: Pt presents with delayed responses to questions and required increased time for cognitive processing. Pt also impulsive when completing functional mobility and self-care. Unknown if this is patient's cognitive baseline.     Vision / Perception      Vision: No visual deficits    Hand Function   B grip strength WFL    Skin Inspection  intact in visible areas     ROM / Strength/Coordination  UE ROM/ Strength/ Coordination: B UE grossly WFL  LE ROM/ Strength/ Coordination: B LE grossly WFL    Sensation:  denies/NT    Balance:    supervision for static and dynamic sitting balance, CGA for static and dynamic standing balance.     Functional Mobility  Transfer Assistance Needed: Yes(sit <> stand with CGA + RW. Pt completed functional mobility to/fom bathroom with CGA + RW and vebral cues 2/2 impulsiveness)  Bed Mobility Assistance Needed: Yes(SBA for supine <> sit transfers)    ADLs  ADLs: Needs assistance with ADLs  ADLs - Needs Assistance: Feeding;Grooming;Bathing;Toileting;LB dressing;UB dressing  Feeding - Needs Assistance: (independent )  Grooming - Needs Assistance: (SBA while standing at sink)  Bathing - Needs Assistance: (min A while seated)  Toileting - Needs Assistance: (CGA using toilet)  UB Dressing - Needs Assistance: (SBA )  LB Dressing - Needs Assistance: (SBA to donn/doff socks while seated EOB 2/2 dizziness. Anticipate CGA to donn pants)    Vitals / Orthostatics  At Rest: NAD  With Activity: HR: 62, BP: 129/66 while seated EOB  Orthostatics: Pt c/o dizziness throughout session    Medical Staff Made Aware: RN updated and aware    Occupational Therapy Session Duration  OT Individual - Duration: 26         I attest that I have reviewed the above information.  Signed: Bonnita Nasuti, OT  Filed 12/12/2018

## 2018-12-12 NOTE — Unmapped (Signed)
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  I am speaking to the patient.    Verified patient's date of birth / HIPAA.    Specialty medication(s) to be sent: Hematology/Oncology: Epogen      Non-specialty medications/supplies to be sent: sharps container      Medications not needed at this time: none         Procrit (Epoetin Alfa)    Medication & Administration     Dosage: Inject 1 mL (20,000 units) under the skin every Tuesday and Friday     Administration:   ? Do not shake bottle  ? To inject subcutaneously:  1. Remove vial from the refrigerator and check expiration date (keep away from direct light)  2. Gather supplies (vial, syringe w/needle, alcohol wipes, sharps container, cotton ball)  3. Wash hands with soap and water before preparing medication  4. Flip off the protective color cap on the top of the vial (do not remove grey rubber stopper)  5. Wipe the top of the grey stopper with an alcohol wipe  6. Open package with syringe/needle and make sure it is not damaged. Then remove needle cover and draw air into the syringe by pulling back on the plunger (amount of air should equal the amount of medication to be injected)  7. Place medication vial on flat surface and insert needle straight down through grey stopper.  8. Push the plunger of the syringe down to inject the air from the syringe into the vial.  9. Keep the needle inside the vial and turn the vial upside down with tip of needle in the liquid.    10. Slowly pull back on the plunger to fill the syringe with medication to the number of mL matching the dose prescribed  11. Keep needle in the vial and check for air bubbles (small amount of air is okay).  To remove large air bubbles, gently tap the syringe with your fingers until bubbles rise to the top.  Slowly push the plunger up to force the air out of the syringe.  Keep the tip of the needle in the liquid.    12. Pull the plunger back to the number that matches your dose.  Repeat steps until all large air bubbles are gone.  13. Make sure dose is correct, and lay the vial down on its side with needle still inside vial  14. Select and clean injection site (abdomen -2 inches away from belly button, front of middle thighs, outer area of upper arms, or upper outer area of buttocks) with alcohol wipe and let dry. Always rotate injection sites and make sure no sores, bruises, tenderness  15. Remove syringe from vial and hold in hand you will use to inject and hold it like a pencil  16. With other hand, pinch a fold of skin at the cleaned injection site  17. With a quick dart like motion insert the needle straight up and down (90 degree angle) or at a 45 degree angle into the skin.  Release the skin and pull plunger back slightly.  (If any blood comes into the syringe, do not inject. Discard that syringe in a sharps container and prepare new syringe) if no blood enters syringe then push plunger all the way down.    18. Pull needle out of the skin and press cotton ball  for several seconds  19. Do not recap needle and immediately dispose in sharps container.     Adherence/Missed dose instructions:  ? Call provider    Goals of Therapy     To normalize red blood cell counts    Side Effects & Monitoring Parameters     ? Common side effects  ? Irritation at injection site  ? High blood pressure  ? Nausea or vomiting  ? Itching  or rash  ? Fever or cough  ? Headache or dizziness  ? Bone, joint, or muscle pain or muscle spasm  ? Mouth irritation or sores    ? The following side effects should be reported to the provider:  ? Allergic reaction (rash, hives, itching, difficulty breathing or swelling)  ? High blood pressure (dizziness, headache, change in vision)  ? High blood sugar (confusion, increased thirst, hunger, urination, fruity breath)  ? Low potassium level (muscle pain/weakness or cramps; abnormal heartbeat)  ? Shortness of breath or swelling, warmth, numbness or change in color or pain in legs or arms  ? Weakness on one side of the body, trouble speaking or thinking, change in balance, drooping on one side of face, blurred eyesight  ? Red, swollen, blistered, or peeling of skin, red or irritated eyes, sores in mouth, throat, nose, or eyes    ? Monitoring Parameters  ? Transferrin saturation and serum ferritin  ? Hemoglobin  ? Blood pressure    Contraindications, Warnings, & Precautions     ? BBW: cardiovascular event - MI, stroke, VTE, vascular access thrombosis, and mortality  ? BBW: a shortened overall survival and/or increased risk of tumor progression or recurrence in patients with cancer. Use lowest dose needed to avoid RBC transfusions.Use this medicine in cancer patients only for the treatment of anemia related to concurrent myelosuppressive chemotherapy and discontinue following completion of the chemotherapy course. This medicine is not indicated for patients receiving myelosuppressive therapy when the anticipated outcome is curative.  ? BBW: an increased risk of death, serious cardiovascular events, and stroke in chronic kidney disease patients. Use the lowest dose sufficient to reduce the need for RBC transfusions.  ? BBW: Deep vein thrombosis prophylaxis is recommended in perisurgery patients due to the increased risk of DVT  ? Erythema multiforme and Stevens-Johnson syndrome, toxic epidermal necrolysis  ? Hypersensitivity  ? Pure red cell aplasia, predominately in patients with chronic kidney disease  ? Hypertension  ? Seizures  ? Severe anemia or acute blood loss    Drug/Food Interactions     ? Medication list reviewed in Epic. The patient was instructed to inform the care team before taking any new medications or supplements. No drug interactions identified.     Storage, Handling Precautions, & Disposal     ? Store in refrigerator  ? Protect from light  ? Keep away from children and pets  ? Dispose of used needles in a sharps container      Current Medications (including OTC/herbals), Comorbidities and Allergies     No current facility-administered medications for this visit.      Current Outpatient Medications   Medication Sig Dispense Refill   ??? epoetin alfa (EPOGEN,PROCRIT) 20,000 unit/mL injection Inject 1 mL (20,000 Units total) under the skin Every Tuesday and Friday. 12 mL 2     Facility-Administered Medications Ordered in Other Visits   Medication Dose Route Frequency Provider Last Rate Last Dose   ??? acetaminophen (TYLENOL) tablet 650 mg  650 mg Oral Q6H PRN Alan Ripper  E Nugent, MD   650 mg at 12/10/18 2011   ??? calcium carbonate (TUMS) chewable tablet 200 mg of elem calcium  200 mg of elem calcium Oral TID PRN De-Vaughn Sima Matas, MD       ??? carBAMazepine (TEGretol  XR) 12 hr tablet 800 mg  800 mg Oral BID Eusebio Me, MD   800 mg at 12/12/18 0920   ??? enoxaparin (LOVENOX) syringe 40 mg  40 mg Subcutaneous Q24H SCH Mervin Kung, ANP   40 mg at 12/12/18 5784   ??? folic acid (FOLVITE) tablet 1 mg  1 mg Oral Daily Eusebio Me, MD   1 mg at 12/12/18 0920   ??? melatonin tablet 3 mg  3 mg Oral Nightly PRN Azucena Freed, MD   3 mg at 12/07/18 0226   ??? oxyCODONE (ROXICODONE) immediate release tablet 5 mg  5 mg Oral Q6H PRN Azucena Freed, MD   5 mg at 12/10/18 1619   ??? phenytoin (DILANTIN) ER capsule 200 mg  200 mg Oral BID Eusebio Me, MD   200 mg at 12/12/18 0919   ??? polyethylene glycol (MIRALAX) packet 17 g  17 g Oral BID PRN Carrolyn Meiers, MD       ??? senna [facility]) tablet 2 tablet  2 tablet Oral BID PRN Carrolyn Meiers, MD       ??? simethicone (MYLICON) chewable tablet 80 mg  80 mg Oral Q6H PRN Rosette Reveal, MD           No Known Allergies    Patient Active Problem List   Diagnosis   ??? Hyperpotassemia   ??? 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 Sickle cell anemia (CMS-HCC)   ??? Hyperkalemia   ??? Proteinuria   ??? Vitamin D deficiency   ??? Seizures (CMS-HCC)   ??? Pain of left hip joint   ??? Ear infection   ??? Sickle cell crisis (CMS-HCC)   ??? Pulmonary HTN (CMS-HCC)   ??? Anemia   ??? Edema   ??? Hypoxemia   ??? Tobacco use disorder       Reviewed and up to date in Epic.    Appropriateness of Therapy     Is medication and dose appropriate based on diagnosis? Yes    Baseline Quality of Life Assessment      How many days over the past month did your sickle cell keep you from your normal activities? 0    Financial Information     Medication Assistance provided: None Required    Anticipated copay of $0 reviewed with patient. Verified delivery address.    Delivery Information     Scheduled delivery date: 4/24    Expected start date: 4/24    Medication will be delivered via Same Day Courier to the home address in [place].  This shipment will not require a signature.      Explained the services we provide at [facility] and that each month we would call to set up refills.  Stressed importance of returning phone calls so that we could ensure they receive their medications in time each month.  Informed patient that we should be setting up refills 7-10 days prior to when they will run out of medication.  A pharmacist will reach out to perform a clinical assessment periodically.  Informed patient that a welcome packet and a drug information handout will be sent.      Patient verbalized  understanding of the above information as well as how to contact the pharmacy at [phone] option 4 with any questions/concerns.  The pharmacy is open Monday through Friday 8:30am-4:30pm.  A pharmacist is available 24/7 via pager to answer any clinical questions they may have.    Patient Specific Needs     ? Does the patient have any physical, cognitive, or cultural barriers? No    ? Patient prefers to have medications discussed with  Patient     ? Is the patient able to read and understand education materials at a high school level or above? Yes    ? Patient's primary language is  English     ? Is the patient high risk? No     ? Does the patient require a Care Management Plan? No     ? Does the patient require physician intervention or other additional services (i.e. nutrition, smoking cessation, social work)? No      Tishana Clinkenbeard [facility] Specialty Pharmacist

## 2018-12-12 NOTE — Unmapped (Signed)
Addended by: [name] on: 12/12/2018 03:39 PM     Modules accepted: Orders

## 2018-12-12 NOTE — Unmapped (Signed)
[facility] Specialty Medication Referral: No PA required    Medication (Brand/Generic): Procrit    Initial Benefits Investigation Claim completed with resulted information below:  No PA required  Patient ABLE to fill at [facility]:  [facility], then [facility]  Anticipated Copay: $0    As Co-pay is under $25 defined limit, per policy there will be no further investigation of need for financial assistance at this time unless patient requests. This referral has been communicated to the provider and handed off to the [facility] team for further processing and filling of prescribed medication.   ______________________________________________________________________  Please utilize this referral for viewing purposes as it will serve as the central location for all relevant documentation and updates.

## 2018-12-12 NOTE — Unmapped (Signed)
Benign Hematology/Coagulation Consult Follow Up Note    Primary Attending Physician:  Anell Barr, MD  Primary Service: Med General Doristine Counter (MDU)    THIS CONSULT WAS COMPLETED VIRTUALLY IN THE SETTING OF COVID-19 PANDEMIC, WITH THE GOAL OF REDUCING TRANSMISSION AND LIMITING PPE USE.    Assessment:   [name] is a 48 y.o. 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  ??  In the ED, evaluation was notable for hypoxia requiring 2 L [place], CXR with clear lungs.    ??  Notably, anemia alone (while it can drive dyspnea) does not explain hypoxia.  There are a number of mechanisms by which sickle cell can be associated with hypoxia.  In [name]' case, we would be concerned above volume overload (given known pulmonary hypertension, elevated BNP) though CXR is relatively clear from an edema perspective.  Fortunately, his CXR is without evidence of acute chest as well.  COVID testing was sent in the ED and negative.  Patients with sickle cell can also develop dynamic shunting phenomena, which may or may not be responsive to transfusion / increased hemoglobin.  A shunt study performed during this admission was negative, and an ABG was obtained demonstrating true hypoxia.    Unfortunately, this is a recurring problem for [name], and based on conversations with the primary team and blood bank, compatible blood products are limited.  Consequently, our initial strategy of serial transfusion support may not be viable.  Alternatively, we could attempt support (and potentially long term support) with EPO analogues.    Blood bank evaluation with broadly specific anti-RBC antibody identified though with negative DAT and negative auto-control reactivity.  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  Unfortunately, transfusion can exacerbate this process, and we will have to rely on erythropoiesis  stimulating agents.    Overnight into 4/20, patient had new difficulties with dizziness and nausea despite stability to improvement in his hemoglobin, with persistent symptoms.  ??  Recommendations:  ??  ?? Agree with re-evaluation for non-hematologic etiologies of new symptoms per primary team.  ? Would recommend repeat chemistries, reticulocyte counts, PT / aPTT  ? Would repeat LDH and send LDH isoenzymes  ? Would send B12 & methylmalonic acid  ? Would send GGT  ? While symptomatic anemia could certainly drive dizziness, [name] symptoms do not clearly link to his degree of anemia given new onset symptoms in setting of stable counts.  We would consequently have low threshold for neuroimaging in the setting new dizziness with possible diplopia in the setting of stable blood counts.  ?? Given difficulties with transfusion, long term transfusion regimen will likely not be feasible.  ? Would NOT transfuse blood product at present without discussion with hematology.  ?? Plan for discharge with twice weekly Epo support when able.  ? We are exploring coverage for Retacrit 20,000 units to be injected at home twice weekly  ? Patient will requiring teaching on administration, as we expect him to need home administration.  ?? Monitor CBC and reticulocyte count daily.  ?? Hydrea: Off.  Previously not tolerated due to neutropenia  ?? Strongly encourage ongoing ICS use while hospitalized  ?? Recommend pharmacologic VTE prophylaxis  This patient was discussed with Dr. Janie Morning. These recommendations were relayed to the primary team by phone.      We will continue to follow. Please contact the Coagulation/Benign Hematology Fellow: [phone] with any further questions.    9:42 AM 12/12/2018   Laurian Brim, MD  Hematology and Oncology Fellow         -------------------------------------------------------------    Subjective:    - New dizziness of uncertain etiology on 4/20  - PT evaluation with need for minimal assistance, recommended for ongoing therapy   - Received retacrit 40,000 units yesterday  - Pro-BNP repeated and essentially unchanged  - Hemoglobin 5.1 today  - Given 20 mg PO lasix this morning    Per discussion with team, patient is complaining of symptoms similar to yesterday, able to ambulate (though appears off balance).  Now complaining of diplopia, though with intact neurologic exam to confrontational testing.      Objective:   Vitals: Temp:  [36.1 ??C (97 ??F)-36.5 ??C (97.7 ??F)] 36.5 ??C (97.7 ??F)  Heart Rate:  [57-69] 69  Resp:  [18] 18  BP: (106-128)/(54-62) 123/60  MAP (mmHg):  [78] 78  SpO2:  [91 %-98 %] 91 %        Test Results  Recent Labs     12/10/18  0806 12/11/18  0830 12/12/18  0805   WBC 9.6 9.8  --    HGB 5.1* 5.3* 5.1*   PLT 200 188 181       Current medication list reviewed.

## 2018-12-12 NOTE — Unmapped (Signed)
Addended by: [name] on: 12/12/2018 09:41 AM     Modules accepted: Orders

## 2018-12-12 NOTE — Unmapped (Signed)
Pt alert and oriented x4. Denies having any pain. Continues to endorse generalized weakness. Bed alarm activated. Pt encouraged to call for assistance. He ate 50% of his dinner and took all scheduled meds.  Pt stated he does not want to be woken up before 8am. All Vitals, weight and lab draw will be done after 0800. Will continue to monitor.     Problem: Adult Inpatient Plan of Care  Goal: Plan of Care Review  Outcome: Progressing  Goal: Patient-Specific Goal (Individualization)  Outcome: Progressing  Goal: Absence of Hospital-Acquired Illness or Injury  Outcome: Progressing  Goal: Optimal Comfort and Wellbeing  Outcome: Progressing  Goal: Readiness for Transition of Care  Outcome: Progressing  Goal: Rounds/Family Conference  Outcome: Progressing     Problem: Pain Acute  Goal: Optimal Pain Control  Outcome: Progressing     Problem: Fall Injury Risk  Goal: Absence of Fall and Fall-Related Injury  Outcome: Progressing

## 2018-12-13 DIAGNOSIS — I2729 Other secondary pulmonary hypertension: Secondary | ICD-10-CM

## 2018-12-13 DIAGNOSIS — G40909 Epilepsy, unspecified, not intractable, without status epilepticus: Secondary | ICD-10-CM

## 2018-12-13 DIAGNOSIS — E559 Vitamin D deficiency, unspecified: Secondary | ICD-10-CM

## 2018-12-13 DIAGNOSIS — F17210 Nicotine dependence, cigarettes, uncomplicated: Secondary | ICD-10-CM

## 2018-12-13 DIAGNOSIS — Z9981 Dependence on supplemental oxygen: Secondary | ICD-10-CM

## 2018-12-13 DIAGNOSIS — R42 Dizziness and giddiness: Secondary | ICD-10-CM

## 2018-12-13 DIAGNOSIS — I110 Hypertensive heart disease with heart failure: Secondary | ICD-10-CM

## 2018-12-13 DIAGNOSIS — Z79899 Other long term (current) drug therapy: Secondary | ICD-10-CM

## 2018-12-13 DIAGNOSIS — I5083 High output heart failure: Secondary | ICD-10-CM

## 2018-12-13 DIAGNOSIS — D571 Sickle-cell disease without crisis: Principal | ICD-10-CM

## 2018-12-13 LAB — COMPREHENSIVE METABOLIC PANEL
ALBUMIN: 3.8 g/dL (ref 3.5–5.0)
ALKALINE PHOSPHATASE: 263 U/L — ABNORMAL HIGH (ref 38–126)
ALKALINE PHOSPHATASE: 263 U/L — ABNORMAL HIGH (ref 38–126)
ALT (SGPT): 31 U/L (ref <50)
ANION GAP: 10 mmol/L (ref 7–15)
AST (SGOT): 118 U/L — ABNORMAL HIGH (ref 19–55)
BILIRUBIN TOTAL: 6.2 mg/dL — ABNORMAL HIGH (ref 0.0–1.2)
BLOOD UREA NITROGEN: 12 mg/dL (ref 7–21)
BUN / CREAT RATIO: 15
CALCIUM: 8.9 mg/dL (ref 8.5–10.2)
CO2: 22 mmol/L (ref 22.0–30.0)
CREATININE: 0.78 mg/dL (ref 0.70–1.30)
EGFR CKD-EPI AA MALE: >90 mL/min/{1.73_m2} (ref >=60)
GLUCOSE RANDOM: 95 mg/dL (ref 70–179)
POTASSIUM: 4.8 mmol/L (ref 3.5–5.0)
PROTEIN TOTAL: 8.7 g/dL — ABNORMAL HIGH (ref 6.5–8.3)
SODIUM: 138 mmol/L (ref 135–145)

## 2018-12-13 LAB — CBC
HEMATOCRIT: 19.3 % — ABNORMAL LOW (ref 41.0–53.0)
MEAN CORPUSCULAR HEMOGLOBIN CONC: 31.8 g/dL (ref 31.0–37.0)
MEAN CORPUSCULAR HEMOGLOBIN: 36.6 pg — ABNORMAL HIGH (ref 26.0–34.0)
MEAN CORPUSCULAR VOLUME: 114.9 fL — ABNORMAL HIGH (ref 80.0–100.0)
MEAN PLATELET VOLUME: 10.9 fL — ABNORMAL HIGH (ref 7.0–10.0)
NUCLEATED RED BLOOD CELLS: 61 /100{WBCs} — ABNORMAL HIGH (ref <=4)
PLATELET COUNT: 165 10*9/L (ref 150–440)
RED BLOOD CELL COUNT: 1.68 10*12/L — ABNORMAL LOW (ref 4.50–5.90)
RED CELL DISTRIBUTION WIDTH: 22.5 % — ABNORMAL HIGH (ref 12.0–15.0)
RED CELL DISTRIBUTION WIDTH: 22.5 % — ABNORMAL HIGH (ref 12.0–15.0)
WBC ADJUSTED: 9.3 10*9/L (ref 4.5–11.0)

## 2018-12-13 LAB — RETICULOCYTES: RETICULOCYTE ABSOLUTE COUNT, MANUAL: 552.8 10*9/L — ABNORMAL HIGH (ref 27.0–120.0)

## 2018-12-13 LAB — PROTIME-INR: INR: 1.21 sec — ABNORMAL HIGH (ref 10.2–13.1)

## 2018-12-13 MED ORDER — PROMETHAZINE 12.5 MG TABLET
Freq: Four times a day (QID) | ORAL | 0 refills | 0.00000 days | Status: CP | PRN
Start: 2018-12-13 — End: 2019-03-12
  Filled 2018-12-13: qty 14, 3d supply, fill #0

## 2018-12-13 MED FILL — BD LUER-LOK SYRINGE 3 ML 25 GAUGE X 1": 28 days supply | Qty: 8 | Fill #0 | Status: AC

## 2018-12-13 MED FILL — PROMETHAZINE 12.5 MG TABLET: 3 days supply | Qty: 14 | Fill #0 | Status: AC

## 2018-12-13 NOTE — Unmapped (Signed)
Physician Discharge Summary [facility]  [facility]  [address]  [place] [address]  Dept: [phone]  Loc: [phone]     Identifying Information:   Arville Lime  03-06-1971  [id number]    Primary Care Physician: Fredirick Maudlin, MD   Code Status: Full Code    Admit Date: 12/06/2018    Discharge Date: 12/13/2018     Discharge To: Home    Discharge Service: MDU - GEN Med Doristine Counter     Discharge Attending Physician: Anell Barr, MD    Discharge Diagnoses:  Principal Problem:    Anemia POA: Yes  Active Problems:    Sickle cell anemia (CMS-HCC) POA: Yes    Seizure disorder (CMS-HCC) POA: Yes    Pulmonary HTN (CMS-HCC) POA: Yes    Hypoxemia POA: Yes    Tobacco use disorder POA: Yes    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  He is now stable for dc home with home health for ongoing teaching for home admin of SQ epo and follow up with hematology in clinic.  Below are more details of his stay at [facility] according to problem:   ??  Hypoxemia (resolved)- Noted to be 86% at clinic requiring 2L  without any respiratory symptoms other than occasional cough. COVID/RPP negative. CXR without effusion, infiltrates or consolidation. Low suspicion for acute chest or infection. He does have a history of pulmonary HTN and high output overload but no edema on CXR and elevated probnp that is down from prior admission. EKG without acute ST,T wave changes and no elevated troponin. Likely due to anemia with little reserve, possible shunting and mild volume overload. Was monitored during stay and not found to have hypoxia with ABG re-assuring.      Sickle cell disease/Anemia- Hgb on presentation of 5.4, baseline Hgb noted to be 6 but has run in low 5s previously, RI <2 indicating hypoproliferation.  LDH (~4,700)  and bilirubin (~5) are at baseline. Denies hematochezia, melena, chest pain and imaging is not concerning for acute chest. Currently pain is controlled. Planned transfusion for 2 U pRBCs, with close volume reassessment and possible diuresis. However, blood bank notified that he is positive to all antibodies so will have to get blood for [employer] which will take up to 2 days.  Given antibodies and poor long-term solution Hematology felt higher dose EPO should be tried to help with counts.  He was taught per RN staff how to admin for home and Hgb monitored with increase to 6.1 noted.  Will get EPO thru [facility] shared services and was provided with syringes/needles and HH will see to con Continued folate and currently not on hydrea.  Will follow up in clinic with hematology/sickle cell.   ??  Seizure Disorder  Continue home tegretol 800 mg BID and Dilantin 200 mg BID   ??  Pulmonary HTN, High Output HF 2/2 severe anemia- ECHO  in feb with RV 3.1, LA dilatation and EF 60-65%. Reports stable weight, no LE edema.??Resolved hypoxemia as above.  Fluid status assessment with PCP and hematology follow up.   ??  Hyperbilirubinemia: at baseline bilirubin ~5 with I>D, most likely 2/2 hemolysis and trended up to 6.2 on day of dc with Hgb up to 6 as noted above.  Continue outpatient monitoring.     Weakness/diplopia/Vertigo/Nausea- had intermittent symptoms during stay and there was some c/f stroke so MRI done w/o stroke noted. Utox negative.   Etiology is unclear and could be due to medication effect as Epogen can cause n/v and dizziness.  Less likely to cause diplopia.  Also, thought it could be antiepileptics but these are not new medications for him and it appears that he is filling scripts and levels normal.  His symptoms were resolved on day of dc and will provide promethazine for prn nausea for home use.  Continue to monitor for symptoms with PCP/hematology follow up and consider medication effects if ongoing.     ??        Touchbase with Outpatient Provider:  Warm Handoff: Completed on 12/13/18 by Mervin Kung  (APP) via Phone    Procedures:  None  No admission procedures for hospital encounter.  ______________________________________________________________________  Discharge Medications:     Your Medication List      START taking these medications    BD LUER-LOK SYRINGE 3 mL 25 gauge x 1 Syrg  Generic drug:  syringe with needle  Use to inject Epogen under the skin every Tuesday and Friday as directed.  Start taking on:  December 15, 2018     epoetin alfa 20,000 unit/mL injection  Commonly known as:  EPOGEN,PROCRIT  Inject 1 mL (20,000 Units total) under the skin Every Tuesday and Friday.     promethazine 12.5 MG tablet  Commonly known as:  PHENERGAN  Take 1 tablet (12.5 mg total) by mouth every six (6) hours as needed for nausea for up to 7 days.        CONTINUE taking these medications    carBAMazepine 400 MG 12 hr tablet  Commonly known as:  TEGretol  XR  Take 2 tablets (800 mg total) by mouth Two (2) times a day.     folic acid 1 MG tablet  Commonly known as:  FOLVITE  Take 1 mg by mouth daily.     phenytoin 100 MG ER capsule  Commonly known as:  DILANTIN  Take 2 capsules (200 mg total) by mouth Two (2) times a day.     VITAMIN D3 10 mcg (400 unit) Cap  Generic drug:  cholecalciferol (vitamin D3)  Take 400 Units by mouth every other day.     VOLTAREN 1 % gel  Generic drug:  diclofenac sodium  Apply 2 g topically daily as needed for arthritis.            Allergies:  Patient has no known allergies.  ______________________________________________________________________  Pending Test Results (if blank, then none):  Pending Labs     Order Current Status    Reticulocytes In process          Most Recent Labs:  All lab results last 24 hours -   Recent Results (from the past 24 hour(s))   PT-INR    Collection Time: 12/13/18 11:13 AM   Result Value Ref Range    PT 14.0 (H) 10.2 - 13.1 sec    INR 1.21    Lactate dehydrogenase  Collection Time: 12/13/18 11:13 AM   Result Value Ref Range    LDH 4,383 (H) 338 - 610 U/L   CBC    Collection Time: 12/13/18 11:13 AM   Result Value Ref Range    WBC 9.3 4.5 - 11.0 10*9/L    RBC 1.68 (L) 4.50 - 5.90 10*12/L    HGB 6.1 (L) 13.5 - 17.5 g/dL    HCT 16.1 (L) 09.6 - 53.0 %    MCV 114.9 (H) 80.0 - 100.0 fL    MCH 36.6 (H) 26.0 - 34.0 pg    MCHC 31.8 31.0 - 37.0 g/dL    RDW 04.5 (H) 40.9 - 15.0 %    MPV 10.9 (H) 7.0 - 10.0 fL    Platelet 165 150 - 440 10*9/L    nRBC 61 (H) <=4 /100 WBCs   Comprehensive Metabolic Panel    Collection Time: 12/13/18 11:13 AM   Result Value Ref Range    Sodium 138 135 - 145 mmol/L    Potassium 4.8 3.5 - 5.0 mmol/L    Chloride 106 98 - 107 mmol/L    Anion Gap 10 7 - 15 mmol/L    CO2 22.0 22.0 - 30.0 mmol/L    BUN 12 7 - 21 mg/dL    Creatinine 8.11 9.14 - 1.30 mg/dL    BUN/Creatinine Ratio 15     EGFR CKD-EPI Non-African American, Male >90 >=60 mL/min/1.7m2    EGFR CKD-EPI African American, Male >90 >=60 mL/min/1.43m2    Glucose 95 70 - 179 mg/dL    Calcium 8.9 8.5 - 78.2 mg/dL    Albumin 3.8 3.5 - 5.0 g/dL    Total Protein 8.7 (H) 6.5 - 8.3 g/dL    Total Bilirubin 6.2 (H) 0.0 - 1.2 mg/dL    AST 956 (H) 19 - 55 U/L    ALT 31 <50 U/L    Alkaline Phosphatase 263 (H) 38 - 126 U/L   Bilirubin, Direct    Collection Time: 12/13/18 11:13 AM   Result Value Ref Range    Bilirubin, Direct 3.00 (H) 0.00 - 0.40 mg/dL   ECG 12 Lead    Collection Time: 12/13/18  1:55 PM   Result Value Ref Range    EKG Systolic BP  mmHg    EKG Diastolic BP  mmHg    EKG Ventricular Rate 63 BPM    EKG Atrial Rate 63 BPM    EKG P-R Interval 182 ms    EKG QRS Duration 106 ms    EKG Q-T Interval 468 ms    EKG QTC Calculation 478 ms    EKG Calculated P Axis 41 degrees    EKG Calculated R Axis 15 degrees    EKG Calculated T Axis 71 degrees    QTC Fredericia 475 ms       Relevant Studies/Radiology (if blank, then none):  Xr Orbits 4 Or More Views    Result Date: 12/13/2018  EXAM: XR ORBITS 4 OR MORE VIEWS DATE: 12/12/2018 9:57 PM ACCESSION: [id number] UN DICTATED: 12/12/2018 9:59 PM INTERPRETATION LOCATION: [facility]     CLINICAL INDICATION: 48 years old Male with MRI planning      COMPARISON: None.     TECHNIQUE: Waters views of the orbits with the eyes looking up and looking down and a lateral view of the orbits.     FINDINGS: Bones are osteopenic. No acute fracture. Aside from dental fillings, no radiopaque foreign bodies. The nasal sinuses are pneumatized. Bones are osteopenic with  coarse appearance of the bones which can be seen in metabolic bone disease.         --Aside from dental fillings, no radiopaque foreign body.     --Diffuse osteopenia.    Mri Brain Wo Contrast    Result Date: 12/13/2018  EXAM: Magnetic resonance imaging, brain, without contrast material. DATE: 12/12/2018 10:28 PM ACCESSION: [id number] UN DICTATED: 12/12/2018 10:35 PM INTERPRETATION LOCATION: [facility]     CLINICAL INDICATION: 48 years old Male with Sickle cell patient with dizziness, vision change  -  c/f stroke      COMPARISON: CT head dated 01/11/2009     TECHNIQUE: Multiplanar, multisequence MR imaging of the brain was performed without I.V. contrast.     FINDINGS:  There is no focal parenchymal signal abnormality. Ventricles are normal in size. There is no midline shift. No extra-axial fluid collection. No evidence of intracranial hemorrhage. No diffusion weighted signal abnormality to suggest acute infarct. Mild scattered foci of increased T2/FLAIR signal predominately in a periventricular distribution, nonspecific but most commonly seen with chronic ischemic changes. No mass. Calvarial thickening and scattered calvarial foci, likely secondary to sickle cell disease.         No acute intracranial abnormality.    Echocardiogram Follow Up/limited Echo    Result Date: 12/08/2018  ?? Dilated left ventricle - mild ?? Normal left ventricular systolic function, ejection fraction > 55% ?? Degenerative mitral valve disease ?? Dilated left atrium - severe ?? Dilated right ventricle - mild ?? Normal right ventricular systolic function ?? Dilated right atrium - mild ?? No evidence of an interatrial communication or intrapulmonary shunt ?? Limited study with saline contrast to exclude an interatrial communication or intrapulmonary shunt      Xr Abdomen 1 View    Result Date: 12/11/2018  EXAM: XR ABDOMEN 1 VIEW DATE: 12/11/2018 3:57 AM ACCESSION: [id number] UN DICTATED: 12/11/2018 3:57 AM INTERPRETATION LOCATION: [facility]     CLINICAL INDICATION: 48 years old Male with ABDOMINAL PAIN  -  OTHER SPECIFIED SITE      COMPARISON: None available.     TECHNIQUE: Supine views of the abdomen.     FINDINGS: Nonobstructed bowel gas pattern. Small colonic stool burden.     Cardiomegaly. Visualized portions of the lung bases are clear.         Nonobstructed bowel gas pattern.     ATTENDING ADDENDUM: Diffuse splenic calcifications, as noted on prior CT examination Hepatomegaly.    ______________________________________________________________________  Discharge Instructions:   Activity Instructions     Activity as tolerated            Diet Instructions     Discharge diet (specify)      Discharge Nutrition Therapy:  General          Other Instructions     Call MD for:  difficulty breathing, headache or visual disturbances      Call MD for:  persistent dizziness or light-headedness      That persists and is not relieved with rest         Call MD for:  persistent nausea or vomiting      Call MD for:  severe uncontrolled pain      Call MD for: Temperature > 38.5 Celsius ( > 101.3 Fahrenheit)      Discharge instructions      [name], you were admitted to [facility] for low blood counts and seen by the blood doctors (hematologists) who recommend using medications as injections (Epogen) at home  to help improve your blood counts.      Continue to follow up closely with the hematology team regarding best treatment options.    You can use promethazine as needed for nausea.    You may continue to have some intermittent dizziness.  We recommend that you do not stand too quickly as this can worsen dizziness and be sure to stand close to a chair/bed for a minute or 2 after standing so that if you fall you have a place to sit.   Remain hydrated by drinking 2-3 liters of water a day in order to prevent lower blood pressure.               Resources and Referrals     [facility] at [phone].     A nurse will see you within 48 hours of d/c to go over your new medications and make sure everything is going okay.                 Follow Up instructions and Outpatient Referrals     Ambulatory referral to Home Health      Disciplines requested:  Nursing    Nursing requested:  Teaching/skilled observation and assessment    What teaching is needed (new diagnosis? new medications?):  Epogen; medication management and reconciliation    Physician to follow patient's care:  Other: (please enter in comments) Comment - Julious Oka, MD     Requested start of care date:  Routine (within 48 hours)    Call MD for:  difficulty breathing, headache or visual disturbances      Call MD for:  persistent dizziness or light-headedness      Call MD for:  persistent nausea or vomiting      Call MD for:  severe uncontrolled pain      Call MD for: Temperature > 38.5 Celsius ( > 101.3 Fahrenheit)      Discharge instructions            Appointments which have been scheduled for you    Dec 22, 2018 11:00 AM EDT  (Arrive by 10:45 AM)  PHONE with Zannie Cove, MD  [facility] PULMONARY SPECIALTY CL [place] [facility] REGION) [address]  [address]  [place] [address]  [phone]   Please DO NOT come to the clinic for this visit. We will call you to discuss your plan of care.      Jan 10, 2019  9:00 AM EDT  (Arrive by 8:45 AM)  Danelle Earthly RETURN with Sandrea Hammond, MD  [facility] NEUROLOGY CLINIC [address] [facility] REGION) [address]  [place] [address]  [phone]      Jan 18, 2019 12:30 PM EDT  (Arrive by 12:15 PM)  RETURN  GENERAL with Vimal Elease Hashimoto, MD  [facility] KIDNEY SPECIALTY AND TRANSPLANT CLINIC [facility] REGION) [address] [place] [address]  [phone]           ______________________________________________________________________  Discharge Day Services:  BP 112/62  - Pulse 56  - Temp 36.7 ??C (98.1 ??F) (Oral)  - Resp 18  - Ht 172.7 cm (5' 7.99)  - Wt 65.5 kg (144 lb 6.4 oz)  - SpO2 96%  - BMI 21.96 kg/m??   Pt seen on the day of discharge and determined appropriate for discharge.    Condition  at Discharge: stable    Length of Discharge: I spent greater than 30 mins in the discharge of this patient.

## 2018-12-13 NOTE — Unmapped (Signed)
Pt resting comfortably, NAD. Pt denies dizziness or unwell feeling this am. Pt is ambulating on unit, voiding, tolerating po. Plan to discharge home with home health, PT and OT. Pt agreeable to plan. VSS. Will continue to monitor.  Problem: Adult Inpatient Plan of Care  Goal: Plan of Care Review  Outcome: Progressing  Goal: Patient-Specific Goal (Individualization)  Outcome: Progressing  Goal: Absence of Hospital-Acquired Illness or Injury  Outcome: Progressing  Goal: Optimal Comfort and Wellbeing  Outcome: Progressing  Goal: Readiness for Transition of Care  Outcome: Progressing  Goal: Rounds/Family Conference  Outcome: Progressing     Problem: Pain Acute  Goal: Optimal Pain Control  Outcome: Progressing     Problem: Fall Injury Risk  Goal: Absence of Fall and Fall-Related Injury  Outcome: Progressing     Problem: Nausea and Vomiting  Goal: Fluid and Electrolyte Balance  Outcome: Progressing

## 2018-12-13 NOTE — Unmapped (Signed)
[facility] at [phone].     A nurse will see you within 48 hours of d/c to go over your new medications and make sure everything is going okay.

## 2018-12-13 NOTE — Unmapped (Signed)
Pharmacist Discharge Note  Patient Name: [name] [name]  Reason for admission: Hypoxemia with acute on chronic anemia secondary to Sickle Cell Disease  Reason for writing this note: patient requires medication-related outpatient intervention and/or monitoring    Highlighted medication changes   Anemia support- Epoetin (Retacrit)- 20,000 units [place] twice weekly on Tues and Fridays.     H/O sz- Phenytoin 200mg  bid                CarbamazepineXR 800 bid                 Phenytoin total level prior to discharge: 7.1 with h/o free fraction 0.22 gives estimated free level of 1.6 which is within goal 1-2 mcg/ml.                Carbamazepine level: 4.1 which is within therapeutic goal of 4-8 mcg/ml       Patient does report some dizziness and some temporary nystagmus has been witnessed, but resolved quickly.     Medication access:  - Epoetin provided by shared service center specialty pharmacy and will be mailed to patients home.    Outpatient follow-up:  [ ]  Free phenytoin level and consideration of lowering dose if dizziness still a problem for patient.    Andres Shad Blenda Wisecup   Clinical Pharmacist    Future Appointments   Date Time Provider Department Center   12/22/2018 11:00 AM Zannie Cove, MD [facility]   01/10/2019  9:00 AM Sandrea Hammond, MD NEUR [facility]   01/18/2019 12:30 PM Vimal Elease Hashimoto, MD NEPHACC [facility]

## 2018-12-13 NOTE — Unmapped (Signed)
Phenytoin Therapeutic Monitoring Pharmacy Note    [name] [name] is a 48 y.o. year old male continuing phenytoin.     Indication: Treatment of seizures.    Prior Dosing Information: Home regimen 200mg  po bid:     Goals:  Therapeutic Drug Levels  ? Total phenytoin level (in absence of free level): 10-20 mcg/mL  ? Free phenytoin level: 1-2 mcg/mL.    Additional Clinical Monitoring Outcomes  Hepatic function, serum albumin, elevated BUN, seizure frequency/cessation    Results:   ? Total phenytoin level: 7.6 mcg/ml  ? Free phenytoin level: Not applicable  ? Calculated Free Fraction: 0.22 based on historical data   ? Based on free fraction of 0.22, estimated free level = 1.6 mcg/ml      Pharmacokinetic Considerations and Significant Drug Interactions:   ? Interacting medications: none identified  ? Enteral nutrition: not applicable    Assessment/Plan:  Recommendation(s)  Continue current regimen of 200mg  bid    Follow-up  ?? Level to be obtained: not applicable    ?? Continue monitoring signs/symptoms of worsening renal and/or hepatic function       Please page service pharmacist with questions/clarifications.    Candee Furbish, RPh   Acute Care Pharmacist, medicine

## 2018-12-13 NOTE — Unmapped (Signed)
Medicine Daily Progress Note    Assessment/Plan:  Principal Problem:    Anemia  Active Problems:    Sickle cell anemia (CMS-HCC)    Seizures (CMS-HCC)    Pulmonary HTN (CMS-HCC)    Hypoxemia    Tobacco use disorder    Diplopia    High output heart failure (CMS-HCC)    Vertigo    Hyperbilirubinemia    Nausea & vomiting  Resolved Problems:    * No resolved hospital problems. *           [name] [name] is a 48 y.o. male with a history of hemoglobin SS disease, pulmonary pretension, high-output heart failure and seizure disorder who presented to [facility] from clinic with hypoxemia of uncertain etiology and severe acute on chronic anemia, and has since developed vertigo, diplopia, nausea and subjective weakness despite normal vitals, negative orthostatics and improved/stable hemoglobin.    Dizziness/subjective weakness/nausea: Although he previously had no identifiable objective neurologic deficits, he appears to have positive Romberg as well as questionable asymmetric dysmetria and reported binocular, but not monocular, diplopia - so consideration of a structural neurologic lesion (namely stroke) is heightened, particularly given the acuity of onset and his underlying predisposition due to sickle cell disease.  On the other hand he has consistently said that his symptoms are worse when standing, but he had negative orthostatic vital signs, his hemoglobin is stable (and improved from nadir, at which time he did not have these symptoms) and he does not have evidence of significant volume depletion.  On the flip side, while he could have some degree of exacerbation of high output heart failure, he does not have significant volume overload by exam, his proBNP is stable, he is not dyspneic and his oxygenation is improved if anything, so worsening cardiac status is an unlikely explanation.    - MRI brain  - Urine toxicology screen    Sickle cell anemia: Severe acute on chronic anemia with down-trending hemoglobin, potentially attributable to delayed hemolytic transfusion reaction though timeframe seems somewhat more delayed than would be expected.  Seems to be having a positive response to EPO analogue administration, with continued slow increase in hemoglobin and jump in reticulocyte count today.  - Further EPO per hematology  - Limit blood draws  - Daily CBC  - Daily reticulocyte count  - Vitamin B12 +/- MMA    Hypoxemia: No obvious etiology and despite unremarkable chest x-ray upon presentation, favor exacerbation of high-output heart failure as a result of his acute on chronic severe anemia, with proBNP at the high end of his range and seemingly a positive response to gentle diuresis.  - Nightly nasal cannula  - Incentive spirometry  - Monitor SpO2  - Repeat proBNP per below    Hyperbilirubinemia: No clear biochemical signal toward hepatobiliary pathology but in association with nausea and upper abdominal symptoms, as well as malaise, fatigue and generally rather non-specific onset of symptomatology (questionable neurologic deficits notwithstanding) described above, this could be considered.  Alternative explanations to consider include, of course, sickle cell disease related hemolysis +/- a DHTR.  Congestive hepatopathy as a result of his high output heart failure could also be considered but is less likely for the same reasons as described above.  - Hematologic evaluation/management per above  - Add on GGT  - Morning CMP  - Repeat LDH, consider isoenzymes    Pulmonary hypertension: Associated with high output heart failure, likely exacerbated by acute on chronic severe anemia. No significant volume overload evident  on exam and CXR was unremarkable but proBNP is as high as it has ever been with the exception of admission 08/2018 for hypoxemia and heart failure.  - Daily weights  - Strict intake/output    Seizure disorder: carbamazepine 800mg  BID, phenytoin 200mg  BID  Constipation: PRN miralax and senna    FEN/GI/PPx: Diet - regular  IVF - none  Replete electrolytes PRN  GI - none  VTE - enoxaparin 40mg     Code status: Full  Dispo: Med U, floor status  Access: PIV  ___________________________________________________________________    Subjective:  Continues to feel poorly overall.  Complains of nausea and dizziness, appetite remains poor.  Reported having blurry or double vision.  Feels quite unsteady on his feet, especially if he is to close his eyes.  He was, however, able to get himself from the bed to the chair without assistance.     Labs/Studies:  Labs and Studies from the last 24hrs per EMR and Reviewed       Objective:  Temp:  [35.6 ??C-36.5 ??C] 35.6 ??C  Heart Rate:  [55-69] 55  Resp:  [18] 18  BP: (113-139)/(56-65) 128/62  SpO2:  [91 %-94 %] 94 %    GEN - Thin man lying sitting in chair with legs propped up, eyes partially closed.  Making inconsistent eye contact.  HEENT - MMM.  No oral lesions.  Conjunctival icterus present.  CV - Normal rate, regular.  Distal pulses intact.  No appreciable JVD.  PULM - Normal WOB on room air.  Adequate air movement.  No cough.  Clear bilaterally.  ABD - Non-distended.  Minimally tender in upper > lower abdomen.  Active bowel sounds.  EXT - Warm. Trace bilateral pitting edema.  NEURO - Alert and oriented.  Normal speech and language.  Slight left facial droop, unchanged.  Binocular, but not monocular, diplopia.  Cranial nerve testing otherwise normal.  Strength 5/5 throughout.  Positive Romberg.  Questionable left > right upper extremity dysmetria.  SKIN - Warm and dry.  No obvious rash on clothed exam.

## 2018-12-13 NOTE — Unmapped (Signed)
Problem: Adult Inpatient Plan of Care  Goal: Plan of Care Review  Outcome: Progressing  Goal: Patient-Specific Goal (Individualization)  Outcome: Progressing  Goal: Absence of Hospital-Acquired Illness or Injury  Outcome: Progressing  Goal: Optimal Comfort and Wellbeing  Outcome: Progressing  Goal: Readiness for Transition of Care  Outcome: Progressing  Goal: Rounds/Family Conference  Outcome: Progressing     Problem: Pain Acute  Goal: Optimal Pain Control  Outcome: Progressing     Problem: Fall Injury Risk  Goal: Absence of Fall and Fall-Related Injury  Outcome: Progressing     Problem: Nausea and Vomiting  Goal: Fluid and Electrolyte Balance  Outcome: Progressing   Pt admitted with Anemia.has been having c/o nausea and unable to eat ,c/o nausea  relieved with prn Zofran at 20.28.pt had MRI Brain done tonight. Orbital Xray done prior to MRI Brain. refused orthostats,said c/o dizziness and does not want to sit up and stand. Pt refused  4 am vitals.Initiated seizure precautions.plan of care explained to pt and aware.will monitor.

## 2018-12-13 NOTE — Unmapped (Signed)
Benign Hematology/Coagulation Consult Follow Up Note    Primary Attending Physician:  Anell Barr, MD  Primary Service: Med General [facility] ([facility])    THIS CONSULT WAS COMPLETED VIRTUALLY IN THE SETTING OF COVID-19 PANDEMIC, WITH THE GOAL OF REDUCING TRANSMISSION AND LIMITING PPE USE.    Assessment:   [name] is a 48 y.o. 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  ??  In the ED, evaluation was notable for hypoxia requiring 2 L , CXR with clear lungs.    ??  Notably, anemia alone (while it can drive dyspnea) does not explain hypoxia.  There are a number of mechanisms by which sickle cell can be associated with hypoxia.  In [name]' case, we would be concerned above volume overload (given known pulmonary hypertension, elevated BNP) though CXR is relatively clear from an edema perspective.  Fortunately, his CXR is without evidence of acute chest as well.  COVID testing was sent in the ED and negative.  Patients with sickle cell can also develop dynamic shunting phenomena, which may or may not be responsive to transfusion / increased hemoglobin.  A shunt study performed during this admission was negative, and an ABG was obtained demonstrating true hypoxia.    Unfortunately, this is a recurring problem for [name], and based on conversations with the primary team and blood bank, compatible blood products are limited.  Consequently, our initial strategy of serial transfusion support may not be viable.  Alternatively, we could attempt support (and potentially long term support) with EPO analogues.    Blood bank evaluation with broadly specific anti-RBC antibody identified though with negative DAT and negative auto-control reactivity.  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  Unfortunately, transfusion can exacerbate this process, and we will have to rely on erythropoiesis  stimulating agents.    Overnight into 4/20, patient had new difficulties with dizziness and nausea with unrevealing work up including neuroimaging, though symptoms have now resolved, and primary team plans for discharge today.  ??  Recommendations:  ??  ?? Given difficulties with transfusion, long term transfusion regimen will likely not be feasible.  ? Would NOT transfuse blood product at present without discussion with hematology.  ?? Plan for discharge with twice weekly Epo support when able.  ? Retacrit 20,000 units for discharge, to be sent [facility].  ? Would given Retacrit 20,000 units today prior to discharge.  ?? We will have patient follow up in our clinic (on [address]) for labs and in-person follow up visit on Monday.  Patient can expect to receive a call about this appointment.  ?? Monitor CBC and reticulocyte count daily.  ?? Hydrea: Off.  Previously not tolerated due to neutropenia  ?? Strongly encourage ongoing ICS use while hospitalized  ?? Recommend pharmacologic VTE prophylaxis     This patient was discussed with Dr. Janie Morning. These recommendations were relayed to the primary team by phone.      We will continue to follow. Please contact the Coagulation/Benign Hematology Fellow: [phone] with any further questions.    10:27 AM 12/13/2018   Laurian Brim, MD  Hematology and Oncology Fellow         -------------------------------------------------------------  Subjective:    - MRI brain performed overnight with no acute intracranial abnormality identified.    Per discussion with team, patient has had resolution of his prior symptoms and is now back to his previously (non)symptomatic baseline.  He is reportedly eager for discharge today.    Objective:   Vitals: Temp:  [35.6 ??C (96.1 ??F)-35.9 ??C (96.6 ??F)] 35.6 ??C (96.1 ??F)  Heart Rate:  [55-60] 55  Resp:  [18] 18  BP: (113-139)/(56-65) 128/62  MAP (mmHg):  [78-86] 86  SpO2:  [93 %-94 %] 94 %  BMI (Calculated):  [21.96] 21.96        Test Results  Recent Labs     12/11/18  0830 12/12/18  0805   WBC 9.8 11.0   HGB 5.3* 5.1*   PLT 188 181       Current medication list reviewed.

## 2018-12-13 NOTE — Unmapped (Signed)
Physician Discharge Summary [facility]  3 [facility]  [address]  [place] [address]  Dept: [phone]  Loc: [phone]     Identifying Information:   [name] [name]  17-Apr-1971  [id number]    Primary Care Physician: Fredirick Maudlin, MD   Code Status: Full Code    Admit Date: 12/06/2018    Discharge Date: 12/13/2018     Discharge To: Home with Home Health and/or PT/OT    Discharge Service: [facility] - [facility]     Discharge Attending Physician: Anell Barr, MD    Discharge Diagnoses:  Principal Problem:    Anemia POA: Yes  Active Problems:    Sickle cell anemia (CMS-HCC) POA: Yes    Seizure disorder (CMS-HCC) POA: Yes    Pulmonary HTN (CMS-HCC) POA: Yes    Hypoxemia POA: Yes    Tobacco use disorder POA: Yes    High output heart failure (CMS-HCC) POA: Unknown    Vertigo POA: Unknown    Hyperbilirubinemia POA: Unknown    Nausea & vomiting POA: Unknown  Resolved Problems:    Diplopia POA: No      Outpatient Provider Follow Up Issues:   [ ]  repeat CBC, retic, LDH   [ ]  evaluate fluid status, could benefit from prn lasix dosing if dizziness remains resolved  [ ]  check oxygen levels, if continues to be low as outpatient may need 6 MWT and supplemental oxygen  [ ]  outpt EKG given prn phenergan given     Hospital Course:   [name] [name] is a 48 y/o male with sickle cell anemia, pulmonary HTN, high output failure and seizures who presnted to hematology clinic and found to be hypoxemic and had a Hgb of 5.4  ??  Hypoxemia (resolved)- Noted to be 86% at clinic requiring 2L [place] without any respiratory symptoms other than occasional cough. COVID/RPP negative. CXR without effusion, infiltrates or consolidation. Low suspicion for acute chest or infection. He does have a history of pulmonary HTN and high output overload but no edema on CXR and elevated probnp that is down from prior admission. EKG without acute ST,T wave changes and no elevated troponin. Likely due to anemia with little reserve, possible shunting and mild volume overload. Was monitored during stay and not found to have hypoxia with ABG re-assuring.  He was weaned to room air for multiple days and remained on RA prior to discharge.     Sickle cell disease/Anemia- Hgb on presentation of 5.4, baseline Hgb noted to be 6 but has run in low 5s previously, RI <2 indicating hypoproliferation.  LDH (~4,700)  and bilirubin (~5) are at baseline. Denies hematochezia, melena, chest pain and imaging is not concerning for acute chest. Currently pain is controlled. Planned transfusion for 2 U pRBCs, with close volume reassessment and possible diuresis. However, blood bank notified that he is positive to all antibodies so will have to get blood for [facility] which will take up to 2 days.  Given antibodies and poor long-term solution Hematology felt higher dose EPO should be tried to help with counts.  He was taught per RN staff how to admin for home and Hgb monitored with increase to 6.1 noted.   Continued folate and currently not on hydrea.  He was given extra EPO dose prior to discharge and instructed to administer EPO BID (Tuesday, Friday schedule) with home health scheduled to come out after dc to help with first administration. Will follow up in clinic with hematology/sickle  cell.   ??  Seizure Disorder  Continue home tegretol 800 mg BID and Dilantin 200 mg BID   ??  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 Reports stable weight, no LE edema.??Resolved hypoxemia as above and on RA prior to dc.  Fluid status assessment with PCP and hematology follow up.  ??  Hyperbilirubinemia: at baseline bilirubin ~5 with I>D, most likely 2/2 hemolysis and trended up to 6.2 on day of dc with Hgb up to 6 as noted above.  Continue outpatient monitoring.     Weakness/diplopia/Vertigo/Nausea- had intermittent symptoms during stay and there was some c/f stroke so MRI done w/o stroke noted. Utox negative.   Etiology is unclear and could be due to medication effect as Epogen can cause n/v and dizziness.  Less likely to cause diplopia.  Also, thought it could be antiepileptics but these are not new medications for him and it appears that he is filling scripts and levels normal.  His symptoms were resolved on day of dc and will provide promethazine for prn nausea for home use.  Continue to monitor for symptoms with PCP/hematology follow up and consider medication effects if ongoing.     ??    Touchbase with Outpatient Provider:  Warm Handoff: Completed on 12/13/18 by Azucena Freed  (Intern) via [facility] Message    Procedures:  None  No admission procedures for hospital encounter.  ______________________________________________________________________  Discharge Medications:     Your Medication List      START taking these medications    BD LUER-LOK SYRINGE 3 mL 25 gauge x 1 Syrg  Generic drug:  syringe with needle  Use to inject Epogen under the skin every Tuesday and Friday as directed.  Start taking on:  December 15, 2018     epoetin alfa 20,000 unit/mL injection  Commonly known as:  EPOGEN,PROCRIT  Inject 1 mL (20,000 Units total) under the skin Every Tuesday and Friday.     promethazine 12.5 MG tablet  Commonly known as:  PHENERGAN  Take 1 tablet (12.5 mg total) by mouth every six (6) hours as needed for nausea for up to 7 days.        CONTINUE taking these medications    carBAMazepine 400 MG 12 hr tablet  Commonly known as:  TEGretol  XR  Take 2 tablets (800 mg total) by mouth Two (2) times a day.     folic acid 1 MG tablet  Commonly known as:  FOLVITE  Take 1 mg by mouth daily.     phenytoin 100 MG ER capsule  Commonly known as:  DILANTIN  Take 2 capsules (200 mg total) by mouth Two (2) times a day.     VITAMIN D3 10 mcg (400 unit) Cap  Generic drug:  cholecalciferol (vitamin D3)  Take 400 Units by mouth every other day.     VOLTAREN 1 % gel  Generic drug:  diclofenac sodium  Apply 2 g topically daily as needed for arthritis.            Allergies:  Patient has no known allergies.  ______________________________________________________________________  Pending Test Results (if blank, then none):  Pending Labs     Order Current Status    Reticulocytes In process          Most Recent Labs:  All lab results last 24 hours -   Recent Results (from the past 24 hour(s))   PT-INR  Collection Time: 12/13/18 11:13 AM   Result Value Ref Range    PT 14.0 (H) 10.2 - 13.1 sec    INR 1.21    Lactate dehydrogenase    Collection Time: 12/13/18 11:13 AM   Result Value Ref Range    LDH 4,383 (H) 338 - 610 U/L   CBC    Collection Time: 12/13/18 11:13 AM   Result Value Ref Range    WBC 9.3 4.5 - 11.0 10*9/L    RBC 1.68 (L) 4.50 - 5.90 10*12/L    HGB 6.1 (L) 13.5 - 17.5 g/dL    HCT 40.9 (L) 81.1 - 53.0 %    MCV 114.9 (H) 80.0 - 100.0 fL    MCH 36.6 (H) 26.0 - 34.0 pg    MCHC 31.8 31.0 - 37.0 g/dL    RDW 91.4 (H) 78.2 - 15.0 %    MPV 10.9 (H) 7.0 - 10.0 fL    Platelet 165 150 - 440 10*9/L    nRBC 61 (H) <=4 /100 WBCs   Comprehensive Metabolic Panel    Collection Time: 12/13/18 11:13 AM   Result Value Ref Range    Sodium 138 135 - 145 mmol/L    Potassium 4.8 3.5 - 5.0 mmol/L    Chloride 106 98 - 107 mmol/L    Anion Gap 10 7 - 15 mmol/L    CO2 22.0 22.0 - 30.0 mmol/L    BUN 12 7 - 21 mg/dL    Creatinine 9.56 2.13 - 1.30 mg/dL    BUN/Creatinine Ratio 15     EGFR CKD-EPI Non-African American, Male >90 >=60 mL/min/1.83m2    EGFR CKD-EPI African American, Male >90 >=60 mL/min/1.59m2    Glucose 95 70 - 179 mg/dL    Calcium 8.9 8.5 - 08.6 mg/dL    Albumin 3.8 3.5 - 5.0 g/dL    Total Protein 8.7 (H) 6.5 - 8.3 g/dL    Total Bilirubin 6.2 (H) 0.0 - 1.2 mg/dL    AST 578 (H) 19 - 55 U/L    ALT 31 <50 U/L    Alkaline Phosphatase 263 (H) 38 - 126 U/L   Bilirubin, Direct    Collection Time: 12/13/18 11:13 AM   Result Value Ref Range    Bilirubin, Direct 3.00 (H) 0.00 - 0.40 mg/dL   ECG 12 Lead    Collection Time: 12/13/18  1:55 PM   Result Value Ref Range    EKG Systolic BP  mmHg    EKG Diastolic BP  mmHg    EKG Ventricular Rate 63 BPM    EKG Atrial Rate 63 BPM    EKG P-R Interval 182 ms    EKG QRS Duration 106 ms    EKG Q-T Interval 468 ms    EKG QTC Calculation 478 ms    EKG Calculated P Axis 41 degrees    EKG Calculated R Axis 15 degrees    EKG Calculated T Axis 71 degrees    QTC Fredericia 475 ms       Relevant Studies/Radiology (if blank, then none):  Xr Orbits 4 Or More Views    Result Date: 12/13/2018  EXAM: XR ORBITS 4 OR MORE VIEWS DATE: 12/12/2018 9:57 PM ACCESSION: [id number] UN DICTATED: 12/12/2018 9:59 PM INTERPRETATION LOCATION: [facility] CLINICAL INDICATION: 48 years old Male with MRI planning  COMPARISON: None. TECHNIQUE: Waters views of the orbits with the eyes looking up and looking down and a lateral view of the orbits. FINDINGS: Bones are osteopenic. No acute  fracture. Aside from dental fillings, no radiopaque foreign bodies. The nasal sinuses are pneumatized. Bones are osteopenic with coarse appearance of the bones which can be seen in metabolic bone disease.     --Aside from dental fillings, no radiopaque foreign body. --Diffuse osteopenia.    Xr Chest Portable    Result Date: 12/06/2018  EXAM: CHEST ONE VIEW DATE: 12/06/2018 ACCESSION: [id number] UN DICTATED: 12/06/2018 2:41 PM INTERPRETATION LOCATION: [facility] CLINICAL INDICATION: 48 years old Male with SHORTNESS OF BREATH  COMPARISON: CXR 09/22/2018 TECHNIQUE: AP semi upright view of the chest.     -Lungs well-inflated. No focal consolidation or edema. Prominent pulmonary vasculature without overt edema similar to prior. -No pleural effusion or pneumothorax. -Stable cardiomediastinal silhouette including cardiomegaly.    Mri Brain Wo Contrast    Result Date: 12/13/2018  EXAM: Magnetic resonance imaging, brain, without contrast material. DATE: 12/12/2018 10:28 PM ACCESSION: [id number] UN DICTATED: 12/12/2018 10:35 PM INTERPRETATION LOCATION: [facility] CLINICAL INDICATION: 48 years old Male with Sickle cell patient with dizziness, vision change  -  c/f stroke  COMPARISON: CT head dated 01/11/2009 TECHNIQUE: Multiplanar, multisequence MR imaging of the brain was performed without I.V. contrast. FINDINGS:  There is no focal parenchymal signal abnormality. Ventricles are normal in size. There is no midline shift. No extra-axial fluid collection. No evidence of intracranial hemorrhage. No diffusion weighted signal abnormality to suggest acute infarct. Mild scattered foci of increased T2/FLAIR signal predominately in a periventricular distribution, nonspecific but most commonly seen with chronic ischemic changes. No mass. Calvarial thickening and scattered calvarial foci, likely secondary to sickle cell disease.     No acute intracranial abnormality.    Echocardiogram Follow Up/limited Echo    Result Date: 12/08/2018  ?? Dilated left ventricle - mild ?? Normal left ventricular systolic function, ejection fraction > 55% ?? Degenerative mitral valve disease ?? Dilated left atrium - severe ?? Dilated right ventricle - mild ?? Normal right ventricular systolic function ?? Dilated right atrium - mild ?? No evidence of an interatrial communication or intrapulmonary shunt ?? Limited study with saline contrast to exclude an interatrial communication or intrapulmonary shunt      Xr Abdomen 1 View    Result Date: 12/11/2018  EXAM: XR ABDOMEN 1 VIEW DATE: 12/11/2018 3:57 AM ACCESSION: [id number] UN DICTATED: 12/11/2018 3:57 AM INTERPRETATION LOCATION: [facility] CLINICAL INDICATION: 48 years old Male with ABDOMINAL PAIN  -  OTHER SPECIFIED SITE  COMPARISON: None available. TECHNIQUE: Supine views of the abdomen. FINDINGS: Nonobstructed bowel gas pattern. Small colonic stool burden. Cardiomegaly. Visualized portions of the lung bases are clear.     Nonobstructed bowel gas pattern. ATTENDING ADDENDUM: Diffuse splenic calcifications, as noted on prior CT examination Hepatomegaly. ______________________________________________________________________  Discharge Instructions:   Activity Instructions     Activity as tolerated            Diet Instructions     Discharge diet (specify)      Discharge Nutrition Therapy:  General          Other Instructions     Call MD for:  difficulty breathing, headache or visual disturbances      Call MD for:  persistent dizziness or light-headedness      That persists and is not relieved with rest         Call MD for:  persistent nausea or vomiting      Call MD for:  severe uncontrolled pain      Call MD for: Temperature > 38.5 Celsius ( > 101.3  Fahrenheit)      Discharge instructions      [name], you were admitted to [facility] for low blood counts and seen by the blood doctors (hematologists) who recommend using medications as injections (Epogen) at home to help improve your blood counts.      Continue to follow up closely with the hematology team regarding best treatment options.    You can use promethazine as needed for nausea.    You may continue to have some intermittent dizziness.  We recommend that you do not stand too quickly as this can worsen dizziness and be sure to stand close to a chair/bed for a minute or 2 after standing so that if you fall you have a place to sit.   Remain hydrated by drinking 2-3 liters of water a day in order to prevent lower blood pressure.               Resources and Referrals     [facility] at [phone].     A nurse will see you within 48 hours of d/c to go over your new medications and make sure everything is going okay.                 Follow Up instructions and Outpatient Referrals     Ambulatory referral to Home Health      Disciplines requested:  Nursing    Nursing requested:  Teaching/skilled observation and assessment    What teaching is needed (new diagnosis? new medications?):  Epogen; medication management and reconciliation    Physician to follow patient's care:  Other: (please enter in comments) Comment - Julious Oka, MD     Requested start of care date:  Routine (within 48 hours)    Call MD for:  difficulty breathing, headache or visual disturbances      Call MD for:  persistent dizziness or light-headedness      Call MD for:  persistent nausea or vomiting      Call MD for:  severe uncontrolled pain      Call MD for: Temperature > 38.5 Celsius ( > 101.3 Fahrenheit)      Discharge instructions            Appointments which have been scheduled for you    Dec 22, 2018 11:00 AM EDT  (Arrive by 10:45 AM)  PHONE with Zannie Cove, MD  [facility] [place] [facility]) [address]  [address]  [place] [address]  [phone]   Please DO NOT come to the clinic for this visit. We will call you to discuss your plan of care.      Jan 10, 2019  9:00 AM EDT  (Arrive by 8:45 AM)  Danelle Earthly RETURN with Sandrea Hammond, MD  [facility] [address] [place] [facility]) [address]  [place] [address]  [phone]      Jan 18, 2019 12:30 PM EDT  (Arrive by 12:15 PM)  RETURN  GENERAL with Vimal Elease Hashimoto, MD  [facility] [place] [facility]) [address] [place] [address]  [phone]           ______________________________________________________________________  Discharge Day Services:  BP 112/62  - Pulse 56  - Temp 36.7 ??C (Oral)  - Resp 18  - Ht 172.7 cm (  5' 7.99)  - Wt 65.5 kg (144 lb 6.4 oz)  - SpO2 96%  - BMI 21.96 kg/m??   Pt seen on the day of discharge and determined appropriate for discharge.    Condition at Discharge: good    Length of Discharge: I spent greater than 30 mins in the discharge of this patient.

## 2018-12-14 NOTE — Unmapped (Signed)
Arville Lime 's EPOGEN 20,000 unit/mL injection (epoetin alfa) shipment will be delayed due to Prior Authorization Required We have contacted the patient and communicated the delivery change to patient/caregiver We will call the patient to reschedule the delivery upon resolution. We have confirmed the delivery date as NA .

## 2018-12-15 ENCOUNTER — Encounter: Admit: 2018-12-15 | Discharge: 2018-12-15 | Payer: MEDICARE

## 2018-12-15 ENCOUNTER — Inpatient Hospital Stay: Admit: 2018-12-15 | Discharge: 2018-12-15 | Payer: MEDICARE

## 2018-12-15 DIAGNOSIS — R42 Dizziness and giddiness: Secondary | ICD-10-CM

## 2018-12-15 DIAGNOSIS — F17210 Nicotine dependence, cigarettes, uncomplicated: Secondary | ICD-10-CM

## 2018-12-15 DIAGNOSIS — D571 Sickle-cell disease without crisis: Principal | ICD-10-CM

## 2018-12-15 DIAGNOSIS — I5083 High output heart failure: Secondary | ICD-10-CM

## 2018-12-15 DIAGNOSIS — E559 Vitamin D deficiency, unspecified: Secondary | ICD-10-CM

## 2018-12-15 DIAGNOSIS — Z9981 Dependence on supplemental oxygen: Secondary | ICD-10-CM

## 2018-12-15 DIAGNOSIS — I2729 Other secondary pulmonary hypertension: Secondary | ICD-10-CM

## 2018-12-15 DIAGNOSIS — I110 Hypertensive heart disease with heart failure: Secondary | ICD-10-CM

## 2018-12-15 DIAGNOSIS — G40909 Epilepsy, unspecified, not intractable, without status epilepticus: Secondary | ICD-10-CM

## 2018-12-15 DIAGNOSIS — Z79899 Other long term (current) drug therapy: Secondary | ICD-10-CM

## 2018-12-15 MED ORDER — BD LUER-LOK SYRINGE 3 ML 25 GAUGE X 1"
2 refills | 0 days | Status: CP
Start: 2018-12-15 — End: 2019-03-07
  Filled 2019-01-18: qty 8, 28d supply, fill #0

## 2018-12-15 MED FILL — PROCRIT 20,000 UNIT/ML INJECTION SOLUTION: 42 days supply | Qty: 12 | Fill #0 | Status: AC

## 2018-12-18 NOTE — Unmapped (Deleted)
I spent 35 minutes on the phone with the patient. I spent an additional 25 minutes on pre- and post-visit activities.     The patient was physically located in [place] or a state in which I am permitted to provide care. The patient understood that s/he may incur co-pays and cost sharing, and agreed to the telemedicine visit. The visit was completed via phone and/or video, which was appropriate and reasonable under the circumstances given the patient's presentation at the time.    The patient has been advised of the potential risks and limitations of this mode of treatment (including, but not limited to, the absence of in-person examination) and has agreed to be treated using telemedicine. The patient's/patient's family's questions regarding telemedicine have been answered.     If the phone/video visit was completed in an ambulatory setting, the patient has also been advised to contact their provider???s office for worsening conditions, and seek emergency medical treatment and/or call 911 if the patient deems either necessary.  This visit was conducted with the use of telecommunications system that permits real time communication between patient/caregiver and the provider to serve as substitute for an in-person clinic visit. This visit involves medical decision making and counseling and patient education.      Patient was advised that there may be a charge involved with this 25  minute telephone encounter. Patient consent to proceed was obtained      Persons present for visit (including role): Only Patient   He is sore, ribs have been sore, and left side of intestine x 3 days. No fever or cough. He thinks the pain started because he hit something, has daily pain, can be in the foot  Social He lives with [place], he lives alone, managing OK. Avoiding crowds. He gets food at the grocery store. Takes it easy. Has had one at least blood transfusion. He didn't notice any difference.   No children, no romantic attachments.  Lives in a trailer.  Occasionally goes to the ED for pain, then he had foot pain.  ROS:  Mental health: Not too anxious  Headaches No headaches  Visual Sxs eyesight is good, no flahers or floaters.  Hearing No problem  Dental health Strong teeth, occasionally sees a dentist  Fever/cough No fevers, cough neither  Ports in place? No  Constipation No black or bloody stools or constipation.  No urine problems, dark colored, 2/night nocturia, worse than it was a year   Palpitations No racing  SOB/DOE can walk 1 mile  Pedal edema he has pitting edema bilaterally x 3 weeks, new problem. He can get his shoes. Doesn't eat a lot of salt.  Leg ulcers  No night time SXs.    Med adherence & review  Tegretol  Dilantin  ASKED HIM TO RESUME FA  D3  Tylenol PRN for pain, currently taking 6/day, only when he has pain, QOD    Needs to reschedule with Dr. [facility] Follow-up Note      Primary Care Physician:   Fredirick Maudlin, MD    Reason for Visit:  Follow up sickle cell anemia, Hgb SS    Assessment/Recommendations:   [name] [name] is a 48 y.o. year old African-American male who returns for follow-up of sickle cell anemia.      1.  Sickle cell anemia, Hgb SS: severe anemia with macrocytosis.  Other subspecialist have felt that he might benefit from HU due to end organ damage  but he has had neutropenia in past, which would get worse in the setting of low doses of HU with his renal dysfunction.  In the past, he has stated he would not be willing to get monthly lab draws necessary to follow for evidence of bone marrow toxicity.  Further, this may be too complicated for him at this time.   I will see him in a week to review the possiblity of simple transfusions and chelation. We will send a red cell phenotype and genotype. He sounds remrkably well today on the phone, however.   2. Pulmonary HTN / Hypoxia: Is not wearing oxygen consistently at night, and we will review this in clinic next week. Last echo was Feb 202, but Mr. Reece Agar did not stay to see Dr. Tresa Res. TRV 3.1, LA dilatation. Ej fr 60-65%  3. Proteinuria with hyperkalemia: Patient not a candidate for ACE inhibitor or ARB with distal RTA.   Elevated PTH suggests CKD. Needs f/u with Dr. Tammi Klippel this summer. Has a borderline high K  4. Health maintenance: Hep B sAb neg in past (never immunized), last PCV23 was 2010. Continues to smoke 2 cigars a day and has tried given up in past without success  5. Seizures, last time 3-4 years ago, still on Tegretol and Dilantin. Due to be seen by Dr. Ferd Glassing nlater this Spring, probably disrupted by COVID, will refill during this COVID episode, but needs f/u.  Plans:  1. Sickle cell anemia, not currently being treated. High risk disease with significant comorbidities, including PHTN (TRV 3.1), seizures (controlled, but lost to f/u), albuminuria  ?? F/u with Labs in 1 week  ?? We will review need for diuretics, and discuss RBC transfusion as long-term management at that time. Needs a red cell phenotype.    2. Pulmonary HTN / Hypoxia:   ?? Continue nighttime o2 Need to review at RTC  ?? F/u with pulmonary (last seen 10/07/17 with f/u in 6 months)    3. Proteinuria  ?? Was on kayexelate PRN in the past, and we will review when we see him.  ?? PTH elevated  ?? F/u renal clinic, re-referred (last time seen 02/15/17 with f/u in 4-6 months)    4. Epilepsy  ?? Continue meds as directed and f/u with neurology    5. H/M:  ?? At last visit, they discussed smoking cessation; goal to smoke one less cigar every day. Will refer to smoking cessation group at next visit if he needs  supplemental nicotine   ?? needs Hep B series We will review these at RTC.  6. Extremely elevated LDH, >4000 at last visit. Etiology NOT clear. We will reassess at RTC. B12 normal.  We will review these at RTC.    FOLLOW UP:   History of Present Illness:   [name] [name] is a 48 y.o. year old African-American male who had a recent phone visit with Dr. Clarene Duke 11/30/2018 and then subsequent office visit on 12/06/2018. Patient was found to be hypoxemic  with severe anemia with hgb of 5.4 so he was sent to ED and admitted to the hospital for further evaluation and treatment. He was admitted 4/15-4/22. During course of hospitalization he was started on SQ epo. He had CXR that was negative. He had also developed some weakness, diplopia, vertigo during admission and had MRI that was negative for CVA. These effects were thought to possibly be medication induced.         He has indicated in the past  that he has never been transfused, although he has had 4 units at [facility].  At a visit on 07/2017, at least was spent re-educating [name] [name] on the benefits of Hydroxyurea and the absence of addiction potential.  The Pulm service saw him 10/2017 and tried to explain the benefit of this medication to him as well.  However, I am concerned that given his h/o neutropenia, renal dysfunction, and his cognitive issues that he would not be able to tolerate this medication, even at low doses.      Sickle Cell Disease History:  Access: peripheral IV  1.  Genotype: SS (hemolysis: hemoglobin 6 , ARC 100-200 , LDH 3000-5000 , Bilirubin 3-5 , urobilinogen 0.2-2 )  Concomitant alpha thal: ?  Other genetic studies: no  BMT?:  no  Folic acid: yes  Hydroxyurea: was on 500mg /day but with mild neutropenia (1200) and minimal F elevation; patient with h/o telling different stories to different providers - in setting of neutropenia and renal dysfunction and minimal improvement in %F, we have not resumed Hydrea  Studies/protocols: no   2. Pain (typical location): rare - legs, feet  Hospitalization frequency:  rare  Outpatient regimen: tylenol  24h Morphine Equivalent Dose (Goal <100): 0  Bowel regimen: none  Inpatient regimen: ?   2. Transfusions: yes per previous notes but on 11/11/17 patient said 'never'  Life time blood transfusions: 6+  Last transfusion: 02/2018   History of DHTR?: unlikely Alloantibodies: no  Iron status: ferritin 259 (11/11/17)  Medications: no   3. CV/Pulm: chronic hypoxia ( improved on HU per previous notes), supposed to be on o2 at night (2LPM); on 11/11/17 states he can 'walk 1/2 mile without getting short of breath'  Acute chest syndrome: no  Need for mechanical ventilation?: no  Pulmonary hypertension: yes, h/o hypoxia - NYHA 2 and ? WHO group 5; RHC 06/2017 - mild PxHTN with PAmean = 26, PCW mean of 16 and increased CO, normal PVR 1.2 wood units   4. Neuro:  Stroke/TIA: no  Imaging:     Other: epilepsy diagnosed at 48yo, on carbamazepine and phenytoin; follows regularly with neurology   5. Psych: cognitive delay  Concerns for depression/anxiety?: no  Concerns for substance dependence?: no  Medications:    6. ID/Immune:  Splenectomy?: no  Antibiotics: no  Immunization history:   Immunization History   Administered Date(s) Administered   ??? Hepatitis B, Adult 03/25/2009, 04/24/2009   ??? INFLUENZA TIV (TRI) PF (IM) 07/21/2009, 09/12/2012   ??? Influenza Vaccine Quad (IIV4 PF) 24mo+ injectable 07/04/2015, 05/07/2016, 04/20/2018   ??? Influenza Virus Vaccine, unspecified formulation 10/11/2018   ??? Meningococcal C Conjugate 03/27/2013   ??? PNEUMOCOCCAL POLYSACCHARIDE 23 03/25/2009, 11/11/2017   ??? Pneumococcal Conjugate 13-Valent 07/04/2015     ANA: positive 1:160 on 06/2017  HIV neg 06/2017  Hep BsAb neg 04/2016   7. GI/Hep:  Cholecystectomy?: no  Hepatopathy?: ? - enlarged on u/s 01/2016  Other: no   8. 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 GU:  Priapism?: yes, remote - last at 48yo per notes   10. M/sk:  AVN?: no  Ulcers?: no  Other: left iliac crest fracture (? 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  Endocrine:   Bone/vitamin D:   Other:    13. Screening:   Ophtho/Retinopathy?: neg 09/2017  Audiology?: no  Dental?: yes   14. Social: watches TV all day, lives alone, drives  Education: high school   Work/Disability:   Smoking: cigars 2x/day  Alcohol:   Other:      Surgical History:  Past Surgical History:   Procedure Laterality Date   ??? PR RIGHT HEART CATH O2 SATURATION & CARDIAC OUTPUT N/A 07/18/2017    Procedure: Right Heart Catheterization;  Surgeon: Zannie Cove, MD;  Location: [facility] CATH;  Service: Cardiology       Medications:   Current Outpatient Medications   Medication Sig Dispense Refill   ??? carBAMazepine (TEGRETOL  XR) 400 MG 12 hr tablet Take 2 tablets (800 mg total) by mouth Two (2) times a day. 120 tablet 11   ??? cholecalciferol, vitamin D3, (VITAMIN D3) 10 mcg (400 unit) cap Take 400 Units by mouth every other day.      ??? diclofenac sodium (VOLTAREN) 1 % gel Apply 2 g topically daily as needed for arthritis.     ??? epoetin alfa (EPOGEN,PROCRIT) 20,000 unit/mL injection Inject 1 mL (20,000 Units total) under the skin Every Tuesday and Friday. 12 mL 2   ??? folic acid (FOLVITE) 1 MG tablet Take 1 mg by mouth daily.     ??? HELIUM-OXYGEN INHL 2 L/kg/min by Each Nare route nightly.     ??? phenytoin (DILANTIN) 100 MG ER capsule Take 2 capsules (200 mg total) by mouth Two (2) times a day. 120 capsule 4   ??? promethazine (PHENERGAN) 12.5 MG tablet Take 1 tablet (12.5 mg total) by mouth every six (6) hours as needed for nausea for up to 7 days. 14 each 0   ??? syringe with needle (BD LUER-LOK SYRINGE) 3 mL 25 gauge x 1 Syrg Use to inject Epogen under the skin every Tuesday and Friday as directed. 8 each 2     No current facility-administered medications for this visit.      Allergies:  Patient has no known allergies.    Social History:  Social History     Socioeconomic History   ??? Marital status: Single     Spouse name: Not on file   ??? Number of children: 0   ??? Years of education: Not on file   ??? Highest education level: Not on file   Occupational History   ??? Occupation: disability   Social Needs   ??? Financial resource strain: Not on file   ??? Food insecurity     Worry: Not on file     Inability: Not on file   ??? Transportation needs     Medical: Not on file     Non-medical: Not on file   Tobacco Use   ??? Smoking status: Current Some Day Smoker     Packs/day: 1.00     Years: 0.50     Pack years: 0.50     Types: Cigars   ??? Smokeless tobacco: Never Used   ??? Tobacco comment: Pt smokes 1-2 cigars daily, Pt intends to cease tobacco use    Substance and Sexual Activity   ??? Alcohol use: Not Currently     Alcohol/week: 0.0 standard drinks     Drinks per session: 1 or 2     Binge frequency: Never     Comment: occasional   ??? Drug use: No   ??? Sexual activity: Not on file  Lifestyle   ??? Physical activity     Days per week: Not on file     Minutes per session: Not on file   ??? Stress: Not on file   Relationships   ??? Social Wellsite geologist on phone: Not on file     Gets together: Not on file     Attends religious service: Not on file     Active member of club or organization: Not on file     Attends meetings of clubs or organizations: Not on file     Relationship status: Not on file   Other Topics Concern   ??? Not on file   Social History Narrative   ??? Not on file     Family History:  family history includes Clotting disorder in his paternal grandmother; Hypertension in his father and mother; No Known Problems in his brother, brother, brother, brother, maternal aunt, maternal grandfather, maternal grandmother, maternal uncle, paternal aunt, paternal grandfather, paternal uncle, sister, sister, and another family member; Sickle cell anemia in his sister.   He indicated that his mother is alive. He indicated that his father is alive. He indicated that two of his three sisters are alive. He indicated that all of his four brothers are alive. He indicated that the status of his maternal grandmother is unknown. He indicated that the status of his maternal grandfather is unknown. He indicated that the status of his paternal grandmother is unknown. He indicated that the status of his paternal grandfather is unknown. He indicated that the status of his maternal aunt is unknown. He indicated that the status of his maternal uncle is unknown. He indicated that the status of his paternal aunt is unknown. He indicated that the status of his paternal uncle is unknown. He indicated that the status of his neg hx is unknown. He indicated that the status of his other is unknown.    Review of Systems:  As per HPI, otherwise negative x 10 systems.    Objective :  There were no vitals filed for this visit.  Physical Exam:  Phone Visit!  Test Results:    Lab Results   Component Value Date    WBC 9.3 12/13/2018    HGB 6.1 (L) 12/13/2018    HCT 19.3 (L) 12/13/2018    PLT 165 12/13/2018       Lab Results   Component Value Date    NA 138 12/13/2018    K 4.8 12/13/2018    CL 106 12/13/2018    CO2 22.0 12/13/2018    BUN 12 12/13/2018    CREATININE 0.78 12/13/2018    GLU 95 12/13/2018    CALCIUM 8.9 12/13/2018    MG 1.8 12/06/2018    PHOS 4.4 12/06/2018       Lab Results   Component Value Date    BILITOT 6.2 (H) 12/13/2018    BILIDIR 3.00 (H) 12/13/2018    PROT 8.7 (H) 12/13/2018    ALBUMIN 3.8 12/13/2018    ALT 31 12/13/2018    AST 118 (H) 12/13/2018    ALKPHOS 263 (H) 12/13/2018    GGT 412 (H) 12/11/2018       Lab Results   Component Value Date    INR 1.21 12/13/2018    APTT 30.3 03/02/2018

## 2018-12-19 DIAGNOSIS — I2729 Other secondary pulmonary hypertension: Secondary | ICD-10-CM

## 2018-12-19 DIAGNOSIS — I110 Hypertensive heart disease with heart failure: Secondary | ICD-10-CM

## 2018-12-19 DIAGNOSIS — E559 Vitamin D deficiency, unspecified: Secondary | ICD-10-CM

## 2018-12-19 DIAGNOSIS — D571 Sickle-cell disease without crisis: Principal | ICD-10-CM

## 2018-12-19 DIAGNOSIS — F17210 Nicotine dependence, cigarettes, uncomplicated: Secondary | ICD-10-CM

## 2018-12-19 DIAGNOSIS — Z79899 Other long term (current) drug therapy: Secondary | ICD-10-CM

## 2018-12-19 DIAGNOSIS — G40909 Epilepsy, unspecified, not intractable, without status epilepticus: Secondary | ICD-10-CM

## 2018-12-19 DIAGNOSIS — Z9981 Dependence on supplemental oxygen: Secondary | ICD-10-CM

## 2018-12-19 DIAGNOSIS — R42 Dizziness and giddiness: Secondary | ICD-10-CM

## 2018-12-19 DIAGNOSIS — I5083 High output heart failure: Secondary | ICD-10-CM

## 2018-12-19 NOTE — Unmapped (Signed)
Delivery scheduled for 12/15/18 [facility] to home address

## 2018-12-19 NOTE — Unmapped (Signed)
Line [phone] kept hanging up; could not connect at this #.  VM left at verified pt # [phone] advising pt he missed 4/27 F2F appt w/Tara Alin. Encouraged to call clinic for reschedule of F2F either Wed 4/29 w/Dr. Clarene Duke or Thurs 4/30 w/ Bruna Potter. At the request of the [facility] Providers, pt advised he will also need to get an EKG.  Will attempt again to see if pt can be reached.

## 2018-12-20 NOTE — Unmapped (Deleted)
[name] did not show up for his hospital discharge follow up visit. We will contact him and try to reschedule for later this week as patient needs to have labs monitored.

## 2018-12-21 NOTE — Unmapped (Addendum)
Per pt call - verifies preferred labdraw location and that he will go Monday 5/4.  Faxed through Epic routing tab to:

## 2018-12-21 NOTE — Unmapped (Signed)
I spent 15 minutes on the phone with the patient. I spent an additional 20 minutes on pre- and post-visit activities.     The patient was physically located in [place] or a state in which I am permitted to provide care. The patient understood that s/he may incur co-pays and cost sharing, and agreed to the telemedicine visit. The visit was completed via phone and/or video, which was appropriate and reasonable under the circumstances given the patient's presentation at the time.    The patient has been advised of the potential risks and limitations of this mode of treatment (including, but not limited to, the absence of in-person examination) and has agreed to be treated using telemedicine. The patient's/patient's family's questions regarding telemedicine have been answered.      Adult Sickle Cell Clinic Follow-up Note      Primary Care Physician:   [name] Maudlin, MD    Reason for Visit:  Follow up sickle cell anemia, Hgb SS    Assessment/Recommendations:   [name] [name] is a 48 y.o. year old African-American male who returns for follow-up of sickle cell anemia.      1.  Sickle cell anemia, Hgb SS: severe anemia with macrocytosis.  Other subspecialist have felt that he might benefit from HU due to end organ damage but he has had neutropenia in past, which would get worse in the setting of low doses of HU with his renal dysfunction.  In the past, he has stated he would not be willing to get monthly lab draws necessary to follow for evidence of bone marrow toxicity.  Further, this may be too complicated for him at this time.  Given significant anemia patient was started on epo in the hospital. He was unable to come into clinic on Monday for his hospital discharge follow up.  2. Pulmonary HTN / Hypoxia: Is not wearing oxygen consistently at night, Most recent outpatient echo 09/25/2018 TRV 3.1, LA dilatation. Ej fr 60-65%, PASP 53  3. Proteinuria with hyperkalemia: Patient not a candidate for ACE inhibitor or ARB with distal RTA.   Elevated PTH suggests CKD. Has an appt with Dr. Tammi [name] in nephrology in May.  Has a borderline high K  4. Health maintenance: Hep B sAb neg in past (never immunized), last PCV23 was 2010.   5. Seizures, last time 3-4 years ago, still on Tegretol and Dilantin. Due to be seen by Dr. Ferd [name] nlater this Spring, probably disrupted by COVID, will refill during this COVID episode, but needs f/u.  Plans:  1. Sickle cell anemia, not currently being treated. High risk disease with significant comorbidities, including PHTN (TRV 3.1), seizures (controlled, but lost to f/u), albuminuria  ?? F/u with Labs in 1 week. Patient is now on epo on Mondays and Thursdays and he would like to get his labwork at  [facility] hospital  2. Pulmonary HTN / Hypoxia:   ?? Continue oxygen as prescribed. Currently on 2L at night.   ?? F/u with pulmonary (07/04/18 was last visit. He left Feb visit before being seen.) Scheduled for visit in May.     3. Proteinuria  ?? PTH elevated  ?? F/u renal clinic on May 28th.     4. Epilepsy  ?? Continue meds as directed and f/u with neurology    5. H/M: Pneumovax booster at follow up. Will also need to start Twinrix series.     FOLLOW UP:     History of Present Illness:   [name] [name]  [name] [name] is a 48 y.o. year old African-American male who we are calling in  follow-up of sickle cell anemia.  Patient was last seen in clinic on 12/06/2018, when he presented with Sx anemia. He was found to be hypoxemic (86% on room air)  with a hgb of 5.4 He was sent ED and admitted for further evaluation and treatment. CXR completed and was negative. EKG without acute changes and troponin was negative. He was started on epo during course of hospitalization.In addition, he had some weakness, diplopia, vertigo while admitted with unclear etiology. He had MRI completed that was negative for any acute changes. Therefore these sx were thought to be medication side effect.     Patient reports that he is no longer experiencing weakness and dizziness. He states that he told staff that the only reason he felt this way was because he was not getting any rest due to repeat vital signs and assessment. He states that now that he is at home and getting rest he is no longer having sx. He reports that he needs to have 8 hours of deep sleep in order to feel well.     He states his energy level has improved but he still feels he is not quite back to his baseline. He states that the epo injections have been a little bit of a hassle for him. He states that he is injecting his epo on Mondays and Thursdays. He has not given his injection yet today because he just woke up. He states he likes to take his anticonvulsants first around 0730 and then he goes back to sleep for a little bit. He has breakfast first then he will take his promethazine a little bit before injecting.          Sickle Cell Disease History:  Access: peripheral IV  1.  Genotype: SS (hemolysis: hemoglobin 6 , ARC 100-200 , LDH 3000-5000 , Bilirubin 3-5 , urobilinogen 0.2-2 )  Concomitant alpha thal: ?  Other genetic studies: no  BMT?:  no  Folic acid: yes  Hydroxyurea: was on 500mg /day but with mild neutropenia (1200) and minimal F elevation; patient with h/o telling different stories to different providers - in setting of neutropenia and renal dysfunction and minimal improvement in %F, we have not resumed Hydrea  Studies/protocols: no   2. Pain (typical location): rare - legs, feet  Hospitalization frequency:  rare  Outpatient regimen: tylenol  24h Morphine Equivalent Dose (Goal <100): 0  Bowel regimen: none  Inpatient regimen: ?   2. Transfusions: yes per previous notes but on 11/11/17 patient said 'never'  Life time blood transfusions: 6+  Last transfusion: 02/2018   History of DHTR?: unlikely  Alloantibodies: no  Iron status: ferritin 259 (11/11/17)  Medications: no   3. CV/Pulm: chronic hypoxia ( improved on HU per previous notes), supposed to be on o2 at night (2LPM); on 11/11/17 states he can 'walk 1/2 mile without getting short of breath'  Acute chest syndrome: no  Need for mechanical ventilation?: no  Pulmonary hypertension: yes, h/o hypoxia - NYHA 2 and ? WHO group 5; RHC 06/2017 - mild PxHTN with PAmean = 26, PCW mean of 16 and increased CO, normal PVR 1.2 wood units   4. Neuro:  Stroke/TIA: no  Imaging:     Other: epilepsy diagnosed at 48yo, on carbamazepine and phenytoin; follows regularly with neurology   5. Psych: cognitive delay  Concerns for depression/anxiety?: no  Concerns for substance dependence?: no  Medications:  6. ID/Immune:  Splenectomy?: no  Antibiotics: no  Immunization history:   Immunization History   Administered Date(s) Administered   ??? Hepatitis B, Adult 03/25/2009, 04/24/2009   ??? INFLUENZA TIV (TRI) PF (IM) 07/21/2009, 09/12/2012   ??? Influenza Vaccine Quad (IIV4 PF) 75mo+ injectable 07/04/2015, 05/07/2016, 04/20/2018   ??? Influenza Virus Vaccine, unspecified formulation 10/11/2018   ??? Meningococcal C Conjugate 03/27/2013   ??? PNEUMOCOCCAL POLYSACCHARIDE 23 03/25/2009, 11/11/2017   ??? Pneumococcal Conjugate 13-Valent 07/04/2015     ANA: positive 1:160 on 06/2017  HIV neg 06/2017  Hep BsAb neg 04/2016   7. GI/Hep:  Cholecystectomy?: no  Hepatopathy?: ? - enlarged on u/s 01/2016  Other: no   8. 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 GU:  Priapism?: yes, remote - last at 48yo per notes   10. M/sk:  AVN?: no  Ulcers?: no  Other: left iliac crest fracture (? 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 Endocrine:   Bone/vitamin D:   Other:    13. Screening:   Ophtho/Retinopathy?: neg 09/2017  Audiology?: no  Dental?: yes   14. Social: watches TV all day, lives alone, drives  Education: high school   Work/Disability:   Smoking: cigars 2x/day  Alcohol:   Other:      Surgical History:  Past Surgical History:   Procedure Laterality Date   ??? PR RIGHT HEART CATH O2 SATURATION & CARDIAC OUTPUT N/A 07/18/2017    Procedure: Right Heart Catheterization;  Surgeon: Zannie Cove, MD;  Location: [facility] CATH;  Service: Cardiology       Medications:   Current Outpatient Medications   Medication Sig Dispense Refill   ??? carBAMazepine (TEGRETOL  XR) 400 MG 12 hr tablet Take 2 tablets (800 mg total) by mouth Two (2) times a day. 120 tablet 11   ??? cholecalciferol, vitamin D3, (VITAMIN D3) 10 mcg (400 unit) cap Take 400 Units by mouth every other day.      ??? diclofenac sodium (VOLTAREN) 1 % gel Apply 2 g topically daily as needed for arthritis.     ??? epoetin alfa (EPOGEN,PROCRIT) 20,000 unit/mL injection Inject 1 mL (20,000 Units total) under the skin Every Tuesday and Friday. 12 mL 2   ??? folic acid (FOLVITE) 1 MG tablet Take 1 mg by mouth daily.     ??? HELIUM-OXYGEN INHL 2 L/kg/min by Each Nare route nightly.     ??? phenytoin (DILANTIN) 100 MG ER capsule Take 2 capsules (200 mg total) by mouth Two (2) times a day. 120 capsule 4   ??? syringe with needle (BD LUER-LOK SYRINGE) 3 mL 25 gauge x 1 Syrg Use to inject Epogen under the skin every Tuesday and Friday as directed. 8 each 2     No current facility-administered medications for this visit.      Allergies:  Patient has no known allergies.    Social History:  Social History     Socioeconomic History   ??? Marital status: Single     Spouse name: None   ??? Number of children: 0   ??? Years of education: None   ??? Highest education level: None   Occupational History   ??? Occupation: disability   Social  Needs   ??? Financial resource strain: None   ??? Food insecurity     Worry: None     Inability: None   ??? Transportation needs     Medical: None     Non-medical: None   Tobacco Use   ??? Smoking status: Current Some Day Smoker     Packs/day: 1.00     Years: 0.50 Pack years: 0.50     Types: Cigars   ??? Smokeless tobacco: Never Used   ??? Tobacco comment: Pt smokes 1-2 cigars daily, Pt intends to cease tobacco use    Substance and Sexual Activity   ??? Alcohol use: Not Currently     Alcohol/week: 0.0 standard drinks     Drinks per session: 1 or 2     Binge frequency: Never     Comment: occasional   ??? Drug use: No   ??? Sexual activity: None   Lifestyle   ??? Physical activity     Days per week: None     Minutes per session: None   ??? Stress: None   Relationships   ??? Social Wellsite geologist on phone: None     Gets together: None     Attends religious service: None     Active member of club or organization: None     Attends meetings of clubs or organizations: None     Relationship status: None   Other Topics Concern   ??? None   Social History Narrative   ??? None     Family History:  family history includes Clotting disorder in his paternal grandmother; Hypertension in his father and mother; No Known Problems in his brother, brother, brother, brother, maternal aunt, maternal grandfather, maternal grandmother, maternal uncle, paternal aunt, paternal grandfather, paternal uncle, sister, sister, and another family member; Sickle cell anemia in his sister.   He indicated that his mother is alive. He indicated that his father is alive. He indicated that two of his three sisters are alive. He indicated that all of his four brothers are alive. He indicated that the status of his maternal grandmother is unknown. He indicated that the status of his maternal grandfather is unknown. He indicated that the status of his paternal grandmother is unknown. He indicated that the status of his paternal grandfather is unknown. He indicated that the status of his maternal aunt is unknown. He indicated that the status of his maternal uncle is unknown. He indicated that the status of his paternal aunt is unknown. He indicated that the status of his paternal uncle is unknown. He indicated that the status of his neg hx is unknown. He indicated that the status of his other is unknown.    Review of Systems:  As per HPI, otherwise negative x 10 systems.    Objective :  Vitals:    12/20/18 1452   Weight: 65.3 kg (144 lb)   Height: 170.2 cm (5' 7)     Physical Exam:  Phone Visit!  Test Results:    Lab Results   Component Value Date    WBC 9.3 12/13/2018    HGB 6.1 (L) 12/13/2018    HCT 19.3 (L) 12/13/2018    PLT 165 12/13/2018       Lab Results   Component Value Date    NA 138 12/13/2018    K 4.8 12/13/2018    CL 106 12/13/2018    CO2 22.0 12/13/2018    BUN 12 12/13/2018  CREATININE 0.78 12/13/2018    GLU 95 12/13/2018    CALCIUM 8.9 12/13/2018    MG 1.8 12/06/2018    PHOS 4.4 12/06/2018       Lab Results   Component Value Date    BILITOT 6.2 (H) 12/13/2018    BILIDIR 3.00 (H) 12/13/2018    PROT 8.7 (H) 12/13/2018    ALBUMIN 3.8 12/13/2018    ALT 31 12/13/2018    AST 118 (H) 12/13/2018    ALKPHOS 263 (H) 12/13/2018    GGT 412 (H) 12/11/2018       Lab Results   Component Value Date    INR 1.21 12/13/2018    APTT 30.3 03/02/2018

## 2018-12-21 NOTE — Unmapped (Addendum)
It was a pleasure talking with you this morning.      Plan from today's visit  ?? Follow up 3 weeks  ?? Please get your labs drawn this week or Monday at the latest. We need to monitor your counts.   ?? You have several upcoming appointments in May. The lung doctor and the kidney doctor. You will receive reminder calls when it gets closer to your appointment time.   ?? Please keep wearing your oxygen every night.         Information about Coronavirus Prevention for Patients with Sickle Cell Disease.  What you can do to protect yourself???  ?? Avoid close contact with people who are sick.  ? No handshakes or kissing.  ? No sharing of utensils.  ? Keep a distance of at least 6 feet apart.  ?? Wash your hands often with soap and water for at least 20 seconds.  ?? Cover your coughs and sneezes with a tissue and then throw the tissue in the trash.  ?? Avoid touching your eyes, nose and mouth.  ?? Clean and disinfect frequently touched objects and surfaces.    What we can do to help you???  ?? Avoid going directly to the ED for pain or minor complaints  ? The ED is very crowded and you may be exposed to the virus and/or your care may be delayed.  ? We may be able to treat your pain in clinic instead of the ED, on a case-by-case basis. Call us at   ?? Stay home when you are sick and call us for additional support.   ? If you are sick, and need an ambulance, CALL 911.    ? During regular office hours, Monday to Friday (8am- 5pm), call [facility] at [phone], 8AM - 5PM, and ask for the NURSE TRIAGE LINE and leave a message   ?? On nights and weekends, for urgent problems you can call the [phone] and ask for the HEMATOLOGIST ON CALL.   ?? If you feel you may have been exposed to COVID-19, please reach out to your primary care provider or the sickle cell team via telephone.  For more information please refer to:   [email or web address]                 How to get in touch with your Sickle Cell Team:      1. If you are sick, and need an ambulance, CALL 911.    ?? Otherwise, call [facility] at [phone], 8AM - 5PM, and ask for the NURSE TRIAGE LINE and leave a message   ?? On the weekends, call [phone] and ask for the HEMATOLOGIST ON CALL for urgent problems only.      2.  For an appointment, please call [facility] at [phone], 8AM - 5PM.     3.  For pain medication refills, leave a message on nurse triage line THREE days before refill needed. All other medication refills ask your pharmacy to fax request.        4.  For general questions, call the sickle cell office, [phone]     5.  For social work questions or needs; Please call Dickie La RN, MSW, MPH at [phone]

## 2018-12-21 NOTE — Unmapped (Signed)
Transitions Social Worker at the [facility]   (outpatient):      SWer attempted to call pt today for Transitions of Care follow up @ [phone] . SWer left a message explaining reason for call and provided SWer's contact information and requested a call back.     Rockwell Alexandria, MSW, LCSW  Transitions Social Worker  [facility]  Office: [phone]  [email or web address]

## 2018-12-26 NOTE — Unmapped (Signed)
Transitions Child psychotherapist at the [facility] & [facility]ty [facility]   (outpatient):     SWer made second attempt to call patient for [facility] follow up. Patient did not answer. SWer left a voicemail explaining reason for call and requested a call back.  SWer will close services but will remain available should needs arise.    Rockwell Alexandria, MSW, LCSW  Transitions Social Worker  [facility]  Office: [phone]  [email or web address]

## 2019-01-02 NOTE — Unmapped (Signed)
Called to remind pt he was to have labs drawn back on 5/; we have not received any results, but call answered and disconnected. Attempted to call back; same disconnect. Letter sent to pt via Epic.

## 2019-01-03 DIAGNOSIS — G40909 Epilepsy, unspecified, not intractable, without status epilepticus: Secondary | ICD-10-CM

## 2019-01-03 DIAGNOSIS — F17210 Nicotine dependence, cigarettes, uncomplicated: Secondary | ICD-10-CM

## 2019-01-03 DIAGNOSIS — I110 Hypertensive heart disease with heart failure: Secondary | ICD-10-CM

## 2019-01-03 DIAGNOSIS — Z79899 Other long term (current) drug therapy: Secondary | ICD-10-CM

## 2019-01-03 DIAGNOSIS — E559 Vitamin D deficiency, unspecified: Secondary | ICD-10-CM

## 2019-01-03 DIAGNOSIS — I5083 High output heart failure: Secondary | ICD-10-CM

## 2019-01-03 DIAGNOSIS — R42 Dizziness and giddiness: Secondary | ICD-10-CM

## 2019-01-03 DIAGNOSIS — Z9981 Dependence on supplemental oxygen: Secondary | ICD-10-CM

## 2019-01-03 DIAGNOSIS — I2729 Other secondary pulmonary hypertension: Secondary | ICD-10-CM

## 2019-01-03 DIAGNOSIS — D571 Sickle-cell disease without crisis: Principal | ICD-10-CM

## 2019-01-03 NOTE — Unmapped (Signed)
Dispensed 42 day supply on 12/15/18.  Next refill due before 01/26/19. Rescheduling clinical call.

## 2019-01-04 DIAGNOSIS — Z79899 Other long term (current) drug therapy: Secondary | ICD-10-CM

## 2019-01-04 DIAGNOSIS — I110 Hypertensive heart disease with heart failure: Secondary | ICD-10-CM

## 2019-01-04 DIAGNOSIS — E559 Vitamin D deficiency, unspecified: Secondary | ICD-10-CM

## 2019-01-04 DIAGNOSIS — Z9981 Dependence on supplemental oxygen: Secondary | ICD-10-CM

## 2019-01-04 DIAGNOSIS — D571 Sickle-cell disease without crisis: Principal | ICD-10-CM

## 2019-01-04 DIAGNOSIS — R42 Dizziness and giddiness: Secondary | ICD-10-CM

## 2019-01-04 DIAGNOSIS — F17210 Nicotine dependence, cigarettes, uncomplicated: Secondary | ICD-10-CM

## 2019-01-04 DIAGNOSIS — G40909 Epilepsy, unspecified, not intractable, without status epilepticus: Secondary | ICD-10-CM

## 2019-01-04 DIAGNOSIS — I5083 High output heart failure: Secondary | ICD-10-CM

## 2019-01-04 DIAGNOSIS — I2729 Other secondary pulmonary hypertension: Secondary | ICD-10-CM

## 2019-01-04 NOTE — Unmapped (Signed)
Patient returned your call and asked if you would call him at: [phone]- he will wait for your call.

## 2019-01-04 NOTE — Unmapped (Signed)
Scheduled patient for 3:30 Wednesday - asked patient to be here at 3pm for labs prior.

## 2019-01-04 NOTE — Unmapped (Signed)
Called preferred number with no answer or voicemail available. Called secondary number which did have the patient's voicemail. Left message for him to return call.

## 2019-01-10 ENCOUNTER — Institutional Professional Consult (permissible substitution): Admit: 2019-01-10 | Discharge: 2019-01-10 | Payer: MEDICARE

## 2019-01-10 ENCOUNTER — Ambulatory Visit: Admit: 2019-01-10 | Discharge: 2019-01-10 | Payer: MEDICARE | Attending: Hematology | Primary: Hematology

## 2019-01-10 DIAGNOSIS — D5700 Hb-SS disease with crisis, unspecified: Principal | ICD-10-CM

## 2019-01-10 DIAGNOSIS — D571 Sickle-cell disease without crisis: Secondary | ICD-10-CM

## 2019-01-10 DIAGNOSIS — G40209 Localization-related (focal) (partial) symptomatic epilepsy and epileptic syndromes with complex partial seizures, not intractable, without status epilepticus: Principal | ICD-10-CM

## 2019-01-10 LAB — CBC W/ AUTO DIFF
BASOPHILS ABSOLUTE COUNT: 0.1 10*9/L (ref 0.0–0.1)
BASOPHILS RELATIVE PERCENT: 1.4 %
EOSINOPHILS ABSOLUTE COUNT: 0.1 10*9/L (ref 0.0–0.4)
EOSINOPHILS RELATIVE PERCENT: 1.3 %
HEMATOCRIT: 19.4 % — CL (ref 41.0–53.0)
HEMOGLOBIN: 6.4 g/dL — CL (ref 13.5–17.5)
LARGE UNSTAINED CELLS: 4 % (ref 0–4)
LYMPHOCYTES ABSOLUTE COUNT: 3.2 10*9/L (ref 1.5–5.0)
LYMPHOCYTES RELATIVE PERCENT: 42.2 %
MEAN CORPUSCULAR HEMOGLOBIN: 36.7 pg — ABNORMAL HIGH (ref 26.0–34.0)
MEAN CORPUSCULAR VOLUME: 111.9 fL — ABNORMAL HIGH (ref 80.0–100.0)
MEAN PLATELET VOLUME: 8.4 fL (ref 7.0–10.0)
MONOCYTES ABSOLUTE COUNT: 0.7 10*9/L (ref 0.2–0.8)
MONOCYTES RELATIVE PERCENT: 8.8 %
NEUTROPHILS RELATIVE PERCENT: 42 %
NUCLEATED RED BLOOD CELLS: 13 /100{WBCs} — ABNORMAL HIGH (ref <=4)
PLATELET COUNT: 186 10*9/L (ref 150–440)
RED BLOOD CELL COUNT: 1.74 10*12/L — ABNORMAL LOW (ref 4.50–5.90)
RED CELL DISTRIBUTION WIDTH: 21.7 % — ABNORMAL HIGH (ref 12.0–15.0)
WBC ADJUSTED: 7.5 10*9/L (ref 4.5–11.0)

## 2019-01-10 LAB — COMPREHENSIVE METABOLIC PANEL
ALBUMIN: 4.1 g/dL (ref 3.5–5.0)
ALKALINE PHOSPHATASE: 315 U/L — ABNORMAL HIGH (ref 38–126)
ALT (SGPT): 29 U/L (ref <50)
ANION GAP: 5 mmol/L — ABNORMAL LOW (ref 7–15)
AST (SGOT): 146 U/L — ABNORMAL HIGH (ref 17–47)
BLOOD UREA NITROGEN: 15 mg/dL (ref 7–21)
BUN / CREAT RATIO: 24
CALCIUM: 8.8 mg/dL (ref 8.5–10.2)
CHLORIDE: 110 mmol/L — ABNORMAL HIGH (ref 98–107)
CO2: 23 mmol/L (ref 22.0–30.0)
CREATININE: 0.63 mg/dL — ABNORMAL LOW (ref 0.70–1.30)
EGFR CKD-EPI AA MALE: >90 mL/min/{1.73_m2} (ref >=60)
EGFR CKD-EPI NON-AA MALE: >90 mL/min/{1.73_m2} (ref >=60)
GLUCOSE RANDOM: 76 mg/dL (ref 70–179)
POTASSIUM: 5.3 mmol/L — ABNORMAL HIGH (ref 3.5–5.0)
PROTEIN TOTAL: 8.7 g/dL — ABNORMAL HIGH (ref 6.5–8.3)
SODIUM: 138 mmol/L (ref 135–145)

## 2019-01-10 LAB — RETICULOCYTE ABSOLUTE COUNT, MANUAL: Lab: 168.8 — ABNORMAL HIGH

## 2019-01-10 LAB — EGFR CKD-EPI NON-AA MALE: Lab: >90

## 2019-01-10 LAB — RETICULOCYTES: RETICULOCYTE ABSOLUTE COUNT, MANUAL: 168.8 10*9/L — ABNORMAL HIGH (ref 27.0–120.0)

## 2019-01-10 LAB — C-REACTIVE PROTEIN: C reactive protein:MCnc:Pt:Ser/Plas:Qn:: 19 — ABNORMAL HIGH

## 2019-01-10 LAB — SLIDE REVIEW

## 2019-01-10 LAB — BILIRUBIN DIRECT: Bilirubin.glucuronidated:MCnc:Pt:Ser/Plas:Qn:: 2.4 — ABNORMAL HIGH

## 2019-01-10 LAB — HEMATOCRIT: Lab: 19.4 — CL

## 2019-01-10 LAB — POIKILOCYTES

## 2019-01-10 LAB — LACTATE DEHYDROGENASE: Lactate dehydrogenase:CCnc:Pt:Ser/Plas:Qn:: 4642 — ABNORMAL HIGH

## 2019-01-10 MED ORDER — PHENYTOIN SODIUM EXTENDED 100 MG CAPSULE
ORAL_CAPSULE | Freq: Two times a day (BID) | ORAL | 11 refills | 0.00000 days | Status: CP
Start: 2019-01-10 — End: ?

## 2019-01-10 MED ORDER — FOLIC ACID 1 MG TABLET
ORAL_TABLET | Freq: Every day | ORAL | 11 refills | 0.00000 days | Status: CP
Start: 2019-01-10 — End: ?

## 2019-01-10 MED ORDER — CARBAMAZEPINE ER 400 MG TABLET,EXTENDED RELEASE,12 HR
ORAL_TABLET | Freq: Two times a day (BID) | ORAL | 11 refills | 0.00000 days | Status: CP
Start: 2019-01-10 — End: ?

## 2019-01-10 NOTE — Unmapped (Addendum)
It was such a pleasure to see you today in clinic!    Plan from today's visit  ? Follow up in 4 weeks in clinic  ? PLEASE Continue to give yourself the EPO shots on M TH, we will send a refill  ? Please keep using your oxygen at night    How to get in touch with your Sickle Cell Team:     1. If you are sick, and need an ambulance, CALL 911.    ? Otherwise, call [facility] at [phone], 8AM - 5PM, and ask for the NURSE TRIAGE LINE and leave a message   ? On nights and weekends, for urgent problems you can call the [phone] and ask for the HEMATOLOGIST ON CALL.     2.  For an appointment, please call [facility] at [phone], 8AM - 5PM.    3.  For pain medication refills, leave a message on nurse triage line THREE days before refill needed. All other medication refills ask your pharmacy to fax request.       4.  For general questions, call the sickle cell office, [phone]. You can leave non-urgent messages here as well.    5.  For social work questions or needs; Please call Dickie La RN, MSW, MPH at [phone]

## 2019-01-10 NOTE — Unmapped (Signed)
Face-to-face  [facility] Follow-up Note  ROS: B Pedal edema x 4 days, now resolved. No real changes in function.Not weak and dizzy as he was.   Otherwise no real changes, using EPO BIW  Mental health Cheerful, sees family every day, parents and brothers  Headaches No  Visual Sxs None  Hearing OK  Dental health OK  Fever/cough no CoVID, no fever, no cough  Ports in place? No  Constipation None  Palpitations None  SOB/DOE With limited exertion, not SOB  Pedal edema 4 days ago.  Leg ulcers None  Recent ED visits or hospitalizations None  Transfusion review  Doesn't sleep well, uses O2 3/7 nights and doesn't notice a difference.  priapism  Pain and pain management, night-time sxs?  Med adherence & review  Tegretol and DPH  Vit D FA    Primary Care Physician:   Fredirick Maudlin, MD    Reason for Visit:  Follow up sickle cell anemia, Hgb SS    Assessment/Recommendations:   [name] [name] is a 48 y.o. year old African-American male who returns for follow-up of sickle cell anemia.      1.  Sickle cell anemia, Hgb SS: severe anemia with macrocytosis.  He might benefit from HU due to end organ damage but he has had neutropenia in past, which may be problematic..  In the past, he has stated he would not be willing to get monthly lab draws necessary to follow for evidence of bone marrow toxicity.  Further, this may be too complicated for him at this time.  Given significant anemia patient was started on epo in the hospital, and has been adherent, although he does not enjoy it. Hgb 6.4, may be worth considering Voxelator, given his hyperhemolysis.F/u with Labs in 1 month, on EPO BIW  2. Pulmonary HTN / Hypoxia: Is not wearing oxygen consistently at night, Most recent outpatient echo 09/25/2018 TRV 3.1, LA dilatation. Ej fr 60-65%, PASP 53  ?? Continue oxygen as prescribed. Currently on 2L at night. Uses only occasionally. Reinforced its use.  ?? F/u with pulmonary may be helpful, especially when we have a multi-disc clinic  3. Proteinuria with hyperkalemia: Patient not a candidate for ACE inhibitor or ARB with distal RTA.   Elevated PTH suggests CKD. Has an appt with Dr. Tammi Klippel in nephrology, but this may need to be rescheduled, as I do not see that this has happened.    4. Health maintenance: Hep B sAb neg in past (never immunized), last PCV23 was 2010. Untransfusable, so may not be a priority at [employer]. Pneumovax booster at follow up. Will also need to start Twinrix series.   5. Seizures, last time 3-4 years ago, still on Tegretol and Dilantin. Seen by Dr. Larey Days this Spring.    FOLLOW UP:     History of Present Illness:   [name] [name] is a 48 y.o. year old African-American male last actually seen in clinic on 12/06/2018, when he presented with Sx anemia. He was found to be hypoxemic (86% on room air)  with a hgb of 5.4 He was sent ED and admitted for further evaluation and treatment.He was started on epo during course of hospitalization.In addition, he had some weakness, diplopia, vertigo while admitted with unclear etiology. He had MRI completed that was negative for any acute changes.He is seen again today in person, 01/10/19, and has a non-transfused Hgb of 6.4, macrocytosis off Hydrea, on BIW EPO at home.    Patient  reports that he is no longer experiencing weakness and dizziness He states that the epo injections remain a Denesha Brouse bit of a hassle for him. He states that he is injecting his epo on Mondays and Thursdays.  He sees his family daily.        Sickle Cell Disease History:  Access: peripheral IV  1.  Genotype: SS (hemolysis: hemoglobin 6 , ARC 100-200 , LDH 3000-5000 , Bilirubin 3-5 , urobilinogen 0.2-2 )  Concomitant alpha thal: ?  Other genetic studies: no  BMT?:  no  Folic acid: yes  Hydroxyurea: was on 500mg /day but with mild neutropenia (1200) and minimal F elevation; patient with h/o telling different stories to different providers - in setting of neutropenia and renal dysfunction and minimal improvement in %F, we have not resumed Hydrea  Studies/protocols: no   2. Pain (typical location): rare - legs, feet  Hospitalization frequency:  rare  Outpatient regimen: tylenol  24h Morphine Equivalent Dose (Goal <100): 0  Bowel regimen: none  Inpatient regimen: ?   2. Transfusions: yes per previous notes but on 11/11/17 patient said 'never'  Life time blood transfusions: 6+  Last transfusion: 02/2018   History of DHTR?: unlikely  Alloantibodies: no  Iron status: ferritin 259 (11/11/17)  Medications: no   3. CV/Pulm: chronic hypoxia ( improved on HU per previous notes), supposed to be on o2 at night (2LPM); on 11/11/17 states he can 'walk 1/2 mile without getting short of breath'  Acute chest syndrome: no  Need for mechanical ventilation?: no  Pulmonary hypertension: yes, h/o hypoxia - NYHA 2 and ? WHO group 5; RHC 06/2017 - mild PxHTN with PAmean = 26, PCW mean of 16 and increased CO, normal PVR 1.2 wood units   4. Neuro:  Stroke/TIA: no  Imaging:     Other: epilepsy diagnosed at 48yo, on carbamazepine and phenytoin; follows regularly with neurology   5. Psych: cognitive delay  Concerns for depression/anxiety?: no  Concerns for substance dependence?: no  Medications:    6. ID/Immune:  Splenectomy?: no  Antibiotics: no  Immunization history:   Immunization History   Administered Date(s) Administered   ??? Hepatitis B, Adult 03/25/2009, 04/24/2009   ??? INFLUENZA TIV (TRI) PF (IM) 07/21/2009, 09/12/2012   ??? Influenza Vaccine Quad (IIV4 PF) 93mo+ injectable 07/04/2015, 05/07/2016, 04/20/2018   ??? Influenza Virus Vaccine, unspecified formulation 10/11/2018   ??? Meningococcal C Conjugate 03/27/2013   ??? PNEUMOCOCCAL POLYSACCHARIDE 23 03/25/2009, 11/11/2017   ??? Pneumococcal Conjugate 13-Valent 07/04/2015     ANA: positive 1:160 on 06/2017  HIV neg 06/2017  Hep BsAb neg 04/2016   7. GI/Hep:  Cholecystectomy?: no  Hepatopathy?: ? - enlarged on u/s 01/2016  Other: no   8. 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 GU:  Priapism?: yes, remote - last at 48yo per notes   10. M/sk:  AVN?: no  Ulcers?: no  Other: left iliac crest fracture (? 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 Endocrine:   Bone/vitamin D:   Other:    13. Screening:   Ophtho/Retinopathy?: neg 09/2017  Audiology?: no  Dental?: yes   14. Social: watches TV all day, lives alone, drives  Education: high school   Work/Disability:  Smoking: cigars 2x/day  Alcohol:   Other:      Surgical History:  Past Surgical History:   Procedure Laterality Date   ??? PR RIGHT HEART CATH O2 SATURATION & CARDIAC OUTPUT N/A 07/18/2017    Procedure: Right Heart Catheterization;  Surgeon: Zannie Cove, MD;  Location: [facility] C[facility];  Service: Cardiology       Medications:   Current Outpatient Medications   Medication Sig Dispense Refill   ??? carBAMazepine (TEGRETOL  XR) 400 MG 12 hr tablet Take 2 tablets (800 mg total) by mouth Two (2) times a day. 120 tablet 11   ??? cholecalciferol, vitamin D3, (VITAMIN D3) 10 mcg (400 unit) cap Take 1 capsule (400 Units total) by mouth daily. 30 each 0   ??? diclofenac sodium (VOLTAREN) 1 % gel Apply 2 g topically daily as needed for arthritis.     ??? epoetin alfa (EPOGEN,PROCRIT) 20,000 unit/mL injection Inject 1 mL (20,000 Units total) under the skin Every Tuesday and Friday. (Patient taking differently: Inject 20,000 Units under the skin Every Monday and Thursday. ) 12 mL 2   ??? folic acid (FOLVITE) 1 MG tablet Take 1 tablet (1 mg total) by mouth daily. 30 tablet 11   ??? HELIUM-OXYGEN INHL 2 L/kg/min by Each Nare route nightly.     ??? phenytoin (DILANTIN) 100 MG ER capsule Take 2 capsules (200 mg total) by mouth Two (2) times a day. 120 capsule 11   ??? syringe with needle (BD LUER-LOK SYRINGE) 3 mL 25 gauge x 1 Syrg Use to inject Epogen under the skin every Tuesday and Friday as directed. 8 each 2     No current facility-administered medications for this visit.      Allergies:  Patient has no known allergies.    Social History:  Social History     Socioeconomic History   ??? Marital status: Single     Spouse name: Not on file   ??? Number of children: 0   ??? Years of education: Not on file   ??? Highest education level: Not on file   Occupational History   ??? Occupation: disability   Social Needs   ??? Financial resource strain: Not on file   ??? Food insecurity     Worry: Not on file     Inability: Not on file   ??? Transportation needs     Medical: Not on file     Non-medical: Not on file   Tobacco Use   ??? Smoking status: Current Some Day Smoker     Packs/day: 1.00     Years: 0.50     Pack years: 0.50     Types: Cigars   ??? Smokeless tobacco: Never Used   ??? Tobacco comment: Pt smokes 1-2 cigars daily, Pt intends to cease tobacco use    Substance and Sexual Activity   ??? Alcohol use: Not Currently     Alcohol/week: 0.0 standard drinks     Drinks per session: 1 or 2     Binge frequency: Never     Comment: occasional   ??? Drug use: No   ??? Sexual activity: Not on file   Lifestyle   ??? Physical activity     Days per week: Not on file     Minutes per session: Not on file   ??? Stress: Not on file   Relationships   ??? Social Wellsite geologist on phone: Not on file     Gets together:  Not on file     Attends religious service: Not on file     Active member of club or organization: Not on file     Attends meetings of clubs or organizations: Not on file     Relationship status: Not on file   Other Topics Concern   ??? Not on file   Social History Narrative   ??? Not on file     Family History:  family history includes Clotting disorder in his paternal grandmother; Hypertension in his father and mother; No Known Problems in his brother, brother, brother, brother, maternal aunt, maternal grandfather, maternal grandmother, maternal uncle, paternal aunt, paternal grandfather, paternal uncle, sister, sister, and another family member; Sickle cell anemia in his sister.   He indicated that his mother is alive. He indicated that his father is alive. He indicated that two of his three sisters are alive. He indicated that all of his four brothers are alive. He indicated that the status of his maternal grandmother is unknown. He indicated that the status of his maternal grandfather is unknown. He indicated that the status of his paternal grandmother is unknown. He indicated that the status of his paternal grandfather is unknown. He indicated that the status of his maternal aunt is unknown. He indicated that the status of his maternal uncle is unknown. He indicated that the status of his paternal aunt is unknown. He indicated that the status of his paternal uncle is unknown. He indicated that the status of his neg hx is unknown. He indicated that the status of his other is unknown.    Review of Systems:  As per HPI, otherwise negative x 10 systems.    Objective :  Vitals:    01/10/19 1526   BP: 112/67   Pulse: 59   Resp: 14   Temp: 36.9 ??C (98.4 ??F)   SpO2: 91%   Weight: 66 kg (145 lb 9 oz)   Height: 170.2 cm (5' 7)     Physical Exam:  Muddy sclera, masked. Appropriate affect. Conversant.  Clubbing, especially index fingers.  CV RRR inc P2 lungs clear. No pedal edema. Gait normal. Facial symmetry    Test Results:    Lab Results   Component Value Date    WBC 9.3 12/13/2018    HGB 6.1 (L) 12/13/2018    HCT 19.3 (L) 12/13/2018    PLT 165 12/13/2018       Lab Results   Component Value Date    NA 138 12/13/2018    K 4.8 12/13/2018    CL 106 12/13/2018    CO2 22.0 12/13/2018    BUN 12 12/13/2018    CREATININE 0.78 12/13/2018    GLU 95 12/13/2018    CALCIUM 8.9 12/13/2018    MG 1.8 12/06/2018    PHOS 4.4 12/06/2018       Lab Results   Component Value Date    BILITOT 6.2 (H) 12/13/2018    BILIDIR 3.00 (H) 12/13/2018    PROT 8.7 (H) 12/13/2018    ALBUMIN 3.8 12/13/2018    ALT 31 12/13/2018    AST 118 (H) 12/13/2018    ALKPHOS 263 (H) 12/13/2018    GGT 412 (H) 12/11/2018       Lab Results   Component Value Date    INR 1.21 12/13/2018    APTT 30.3 03/02/2018
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
## 2019-01-10 NOTE — Unmapped (Signed)
Patient Education        Epilepsy: Care Instructions  Your Care Instructions    Epilepsy is a common condition that causes repeated seizures. The seizures are caused by bursts of electrical activity in the brain that aren't normal. Seizures may cause problems with muscle control, movement, speech, vision, or awareness. They can be scary.  Epilepsy affects each person differently. Some people have only a few seizures. Others get them more often. If you know what triggers a seizure, you may be able to avoid having one.  You can take medicines to control and reduce seizures. You and your doctor will need to find the right combination, schedule, and dose of medicine. This may take time and careful changes.  Seizures may get worse and happen more often over time.  Follow-up care is a key part of your treatment and safety. Be sure to make and go to all appointments, and call your doctor if you are having problems. It's also a good idea to know your test results and keep a list of the medicines you take.  How can you care for yourself at home?  ?? Be safe with medicines. Take your medicines exactly as prescribed. Call your doctor if you think you are having a problem with your medicine.  ?? Make a treatment plan with your doctor. Be sure to follow your plan.  ?? Try to identify and avoid things that may make you more likely to have a seizure. These may include:  ? Not getting enough sleep.  ? Using drugs or alcohol.  ? Being emotionally stressed.  ? Skipping meals.  ?? Keep a record of any seizures you have. Note the date, time of day, and any details about the seizure that you can remember. Your doctor can use this information to plan or adjust your medicine or other treatment.  ?? Be sure that any doctor treating you for another condition knows that you have epilepsy. Each doctor should know what medicines you are taking, if any.  ?? Wear a medical ID bracelet. You can buy this at most drugstores. If you have a seizure that leaves you unconscious or unable to speak for yourself, this bracelet will let those who are treating you know that you have epilepsy.  ?? Talk to your doctor about whether it is safe for you to do certain activities, such as drive or swim.  When should you call for help?  Call 911 anytime you think you may need emergency care. For example, call if:  ?? ?? A seizure does not stop as it normally does.   ?? ?? You have new symptoms such as:  ? Numbness, tingling, or weakness on one side of your body or face.  ? Vision changes.  ? Trouble speaking or thinking clearly.   ??Call your doctor now or seek immediate medical care if:  ?? ?? You have a fever.   ?? ?? You have a severe headache.   ??Watch closely for changes in your health, and be sure to contact your doctor if:  ?? ?? The normal pattern or features of your seizures change.   Where can you learn more?  Go to [facility] at [email or web address]  [facility] under [employer]. Enter X141 in the search box to learn more about Epilepsy: Care Instructions.  Current as of: November 19, 2019Content Version: 12.4  ?? 2006-2020 [employer].  Care instructions adapted under license by [facility]. If you have questions about  a medical condition or this instruction, always ask your healthcare professional. Healthwise, Incorporated disclaims any warranty or liability for your use of this information.

## 2019-01-11 MED ORDER — EMPTY CONTAINER
2 refills | 0 days
Start: 2019-01-11 — End: ?

## 2019-01-11 NOTE — Unmapped (Signed)
[facility] Clinical Assessment & Refill Coordination Note    [name] [name], DOB: 09-Aug-1971  Phone: [phone] (home)     All above HIPAA information was verified with patient.     Specialty Medication(s):   Hematology/Oncology: Epogen     Current Outpatient Medications   Medication Sig Dispense Refill   ??? carBAMazepine (TEGRETOL  XR) 400 MG 12 hr tablet Take 2 tablets (800 mg total) by mouth Two (2) times a day. 120 tablet 11   ??? cholecalciferol, vitamin D3, (VITAMIN D3) 10 mcg (400 unit) cap Take 1 capsule (400 Units total) by mouth daily. 30 each 0   ??? diclofenac sodium (VOLTAREN) 1 % gel Apply 2 g topically daily as needed for arthritis.     ??? epoetin alfa (EPOGEN,PROCRIT) 20,000 unit/mL injection Inject 1 mL (20,000 Units total) under the skin Every Tuesday and Friday. (Patient taking differently: Inject 20,000 Units under the skin Every Monday and Thursday. ) 12 mL 2   ??? folic acid (FOLVITE) 1 MG tablet Take 1 tablet (1 mg total) by mouth daily. 30 tablet 11   ??? HELIUM-OXYGEN INHL 2 L/kg/min by Each Nare route nightly.     ??? phenytoin (DILANTIN) 100 MG ER capsule Take 2 capsules (200 mg total) by mouth Two (2) times a day. 120 capsule 11   ??? syringe with needle (BD LUER-LOK SYRINGE) 3 mL 25 gauge x 1 Syrg Use to inject Epogen under the skin every Tuesday and Friday as directed. 8 each 2     No current facility-administered medications for this visit.         Changes to medications: [name] [name] reports no changes at this time.    No Known Allergies    Changes to allergies: No    SPECIALTY MEDICATION ADHERENCE     Epogen 20,000 units/mL: 10 days of medicine on hand     Specialty medication(s) dose(s) confirmed: Regimen is correct and unchanged.     Are there any concerns with adherence? No    Adherence counseling provided? Not needed    CLINICAL MANAGEMENT AND INTERVENTION      Clinical Benefit Assessment:    Do you feel the medicine is effective or helping your condition? Yes    Clinical Benefit counseling provided? Progress note from 01/10/19 shows evidence of clinical benefit    Adverse Effects Assessment:    Are you experiencing any side effects? No    Are you experiencing difficulty administering your medicine? No    Quality of Life Assessment:    How many days over the past month did your Sickle cell   keep you from your normal activities? For example, brushing your teeth or getting up in the morning. 0    Have you discussed this with your provider? Not needed    Therapy Appropriateness:    Is therapy appropriate? Yes, therapy is appropriate and should be continued    DISEASE/MEDICATION-SPECIFIC INFORMATION      For patients on injectable medications: Patient currently has 3 doses left.  Next injection is scheduled for 01/11/19.    PATIENT SPECIFIC NEEDS     ? Does the patient have any physical, cognitive, or cultural barriers? No    ? Is the patient high risk? No     ? Does the patient require a Care Management Plan? No     ? Does the patient require physician intervention or other additional services (i.e. nutrition, smoking cessation, social work)? No  SHIPPING     Specialty Medication(s) to be Shipped:   Hematology/Oncology: Epogen    Other medication(s) to be shipped: sharps container, syringes, and promethazine (refills req)     Changes to insurance: No    Delivery Scheduled: Yes, Expected medication delivery date: 01/18/19.  However, Rx request for refills was sent to the provider as there are none remaining.     Medication will be delivered via Same Day Courier to the confirmed home address in [name].    The patient will receive a drug information handout for each medication shipped and additional FDA Medication Guides as required.  Verified that patient has previously received a Conservation officer, historic buildings.    All of the patient's questions and concerns have been addressed.    [name] [facility] Specialty Pharmacist

## 2019-01-18 MED FILL — EMPTY CONTAINER: 90 days supply | Qty: 1 | Fill #0 | Status: AC

## 2019-01-18 MED FILL — BD LUER-LOK SYRINGE 3 ML 25 GAUGE X 1": 28 days supply | Qty: 8 | Fill #0 | Status: AC

## 2019-01-18 MED FILL — EMPTY CONTAINER: 90 days supply | Qty: 1 | Fill #0

## 2019-01-18 MED FILL — PROCRIT 20,000 UNIT/ML INJECTION SOLUTION: SUBCUTANEOUS | 28 days supply | Qty: 8 | Fill #1

## 2019-01-18 MED FILL — PROCRIT 20,000 UNIT/ML INJECTION SOLUTION: 28 days supply | Qty: 8 | Fill #1 | Status: AC

## 2019-01-23 ENCOUNTER — Institutional Professional Consult (permissible substitution): Admit: 2019-01-23 | Discharge: 2019-01-24 | Payer: MEDICARE

## 2019-01-23 DIAGNOSIS — R0902 Hypoxemia: Secondary | ICD-10-CM

## 2019-01-23 DIAGNOSIS — D571 Sickle-cell disease without crisis: Secondary | ICD-10-CM

## 2019-01-23 DIAGNOSIS — I272 Pulmonary hypertension, unspecified: Principal | ICD-10-CM

## 2019-01-23 DIAGNOSIS — I50812 Chronic right heart failure: Secondary | ICD-10-CM

## 2019-01-23 NOTE — Unmapped (Signed)
[facility]  Pulmonary Hypertension Program                        Lattie Haw, MD???Director (Pulmonary)  Denice Bors. Tresa Res, MD--Associate Director (Pulmonary)  Freeman Caldron, MD (Cardiology)  Esmond Plants, DO (Pulmonary)    Marva Panda, RN Nurse Coordinator  Claretha Cooper, RN Nurse Coordinator      [phone] office  [phone] fax          [facility] Pulmonary Diseases and Critical Care Medicine  Video Telehealth Visit Note    01/23/19    8:54 AM    Assessment:      Patient:[name] [name] (March 22, 1971)    [name] is a 48 y.o. male who is seen in a video telehealth visit for hypoxemia and pulmonary hypertension. 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 The patient's current NYHA functional class is Class 2 - comfortable at rest but have symptoms with ordinary physical activity.      From a PH standpoint, [name] has mild pulmonary hypertension by RHC, with elevated wedge pressure and very normal pulmonary vascular resistance. Based on these hemodynamics, there is no indication for PAH-specific therapy at this time. We have previously discussed hydroxyurea, ultimately thought to be too high risk for this patient. From an oxygenation standpoint, he has been borderline hypoxemic at rest and with exertion, and very clearly hypoxemic overnight. He continues to state that he will not consider daytime oxygen at all, so I have focused on nocturnal adherence, though oxygen use in general has improved very little over time. His severe anemia combined with persistent hypoxemia is worrisome.      Plan:      - Strongly encouraged oxygen use at least at night; I do think he needs this intermittently during the day but I do not think he is at all willing to do this, so will start with nighttime adherence. Will check in with [facility] to see if he has an option for an updated, quieter concentrator, which he says will definitely improve his adherence. Overnight oximetry once adhering to oxygen plan to further adjust flow.  - As previously noted; PH high output and volume driven, no indication for PAH-specific therapy at this time.  - No plans to start hydroxyurea given mild renal dysfunction and neutropenia.  - Despite mildly elevated RA and wedge pressures, diuresis is challenging in [name] as I do worry about causing dehydration and increased sickling with use of diuretics. I do not think benefits of regular diuresis would outweigh risks, at least right now.  - I will update hematology team re: current Epogen use.    Followup in 3 months, or sooner if needed.    The above plan was discussed with the patient and he is in agreement.      I spent 17 minutes on the phone with the patient. I spent an additional 12 minutes on pre- and post-visit activities.     The patient was physically located in [place] or a state in which I am permitted to provide care. The patient and/or parent/gauardian understood that s/he may incur co-pays and cost sharing, and agreed to the telemedicine visit. The visit was completed via phone and/or  video, which was appropriate and reasonable under the circumstances given the patient's presentation at the time.    The patient and/or parent/guardian has been advised of the potential risks and limitations of this mode of treatment (including, but not limited to, the absence of in-person examination) and has agreed to be treated using telemedicine. The patient's/patient's family's questions regarding telemedicine have been answered.     If the phone/video visit was completed in an ambulatory setting, the patient and/or parent/guardian has also been advised to contact their provider???s office for worsening conditions, and seek emergency medical treatment and/or call 911 if the patient deems either necessary.      Cecelia Byars, MD  01/23/19         Subjective:        Patient's primary [place] pulmonologist: Cecelia Byars, MD   Patient's physical location during visit [place], [place]  Patient's call back number: [phone]    Phone visit start time: 8:37am  Visit end time: 8:54am    Name/Relationship of other individuals with the patient in the room during phone telehealth visit: none  I have confirmed that the patient requests and consents to having the above listed persons stay in the patient's room during the phone telehealth visit.    HPI: [name] is a 48 y.o. male who is seen in a video telehealth visit for followup pulmonary hypertension and hypoxemia.    Relevant pulmonary issues including:  - Multifactorial pulmonary hypertension (mPAP 26, PCWP 16, CO/CI 10.3/5.7, PVR 1.2) by RHC 06/2017  - Sickle cell disease, denies history of acute chest syndrome  - Hypoxemia, prescribed intermittent O2 since at least 2016  - RTA, with hyperkalemia  - Smoking history: current of small cigars, 2/day, reports only smoking since 2017    PAH treatment history:  None    Oxygen use: 2LPM at night and PRN exertion (not recently using)    Initial visit 11/2016: Presents for further evaluation of possible pulmonary hypertension based on prior echos. He has history of sickle cell disease, estimates having pain crises 2-3 years with involvement of legs/feet in particular. Denies prior history of acute chest syndrome. He received transfusion last month but denies otherwise needing transfusions recently (last here in 2016). He is not on hydroxyurea (patient declines this). He reports feeling generally well from a cardiopulmonary standpoint. He does not exercise and is not all that active with strenuous activity. Does not get out of breath with walking around at home or with self care, and thinks he would feel okay with one flight of stairs (only has a couple stairs at home). Denies chest pain or lightheadedness. No orthopnea. Notes edema only when having a pain crisis. He is currently on oxygen on PRN basis, with sleep and with exertion. He does not regularly wear it at night, only once in awhile and does not use it much during the day. He does not have a POC and is not interested in having one (does not think he would use this). DME company is [employer]. With initial visit I scheduled VQ scan, and we discussed increasing oxygen use. I also recommended diagnostic RHC and hydroxyurea, but he was very reluctant to pursue either.    Subsequent history: At followup 02/2017 he was feeling okay. Oxygenation on actually significantly improved. He had recently started on hydroxyurea after a renal visit, but also expressed reluctance to continue this. I again recommended RHC and suggested talking about this more during his hematology appointment over  the summer. Also stressed increased use of oxygen particularly at night. At followup 06/2017 he was feeling about the same, did note some new dizziness. Had stopped hydroxyurea. I recommended RHC, scheduled later that month, showing very mild PH with elevated PCWP and high cardiac output. At followup 09/2017 he was feeling well, discussed hydroxyurea and he cited cost as the reason he did not want to take it, which we clarified. Discussed increase use of oxygen. At followup 03/2018 he was doing okay, HU ultimately decided to be too high risk due to renal dysfunction and neutropenia. Oxygen use still rare.    Interval history 01/2019: Since last visit, [name] has been doing okay. He had a followup echo which we discussed by phone, but left before being seen for his appointment over the winter. He reports doing well overall. He has had two hospitalization in the last 6 months, in 08/2018 with edema and anemia, given IV Lasix that admission with good effect. In 11/2018 he was admitted with hypoxemia while at hematology visit, Hgb worsened from usual baseline, difficult crossmatch for transfusion. Epogen injections started. Reports these have been going well overall, though finding this more irritating lately, and will sometimes forget to take his injection (usually M/Th). Did not take yesterday's for example. Denies recent DOE, cough, CP, LH. Denies edema, no recent Lasix needs. He continues to smoke a couple small cigars daily. Using oxygen at night, most nights now (trying to do every night), though admits to turning it off during the night due to machine being too loud. Does not think he has sleeping problems otherwise. No using oxygen during the day. Denies having portable oxygen (tanks or POC) at the moment and states he would not use them if he did.    Past medical, past surgical, family, and social histories reviewed and updated in Epic.    Review of systems is significant for:   A 12 point review of systems was negative except for pertinent items noted in the HPI.    No Known Allergies   Current Outpatient Medications   Medication Sig Dispense Refill   ??? carBAMazepine (TEGRETOL  XR) 400 MG 12 hr tablet Take 2 tablets (800 mg total) by mouth Two (2) times a day. 120 tablet 11   ??? cholecalciferol, vitamin D3, (VITAMIN D3) 10 mcg (400 unit) cap Take 1 capsule (400 Units total) by mouth daily. 30 each 0   ??? diclofenac sodium (VOLTAREN) 1 % gel Apply 2 g topically daily as needed for arthritis.     ??? empty container (SHARPS CONTAINER) Misc use as directed 1 each 2   ??? epoetin alfa (EPOGEN,PROCRIT) 20,000 unit/mL injection Inject 1 mL (20,000 Units total) under the skin Every Tuesday and Friday. (Patient taking differently: Inject 20,000 Units under the skin Every Monday and Thursday. ) 12 mL 2   ??? folic acid (FOLVITE) 1 MG tablet Take 1 tablet (1 mg total) by mouth daily. 30 tablet 11   ??? OXYGEN-AIR DELIVERY SYSTEMS MISC 2 L/min by Each Nare route nightly. [employer] ??? phenytoin (DILANTIN) 100 MG ER capsule Take 2 capsules (200 mg total) by mouth Two (2) times a day. 120 capsule 11   ??? syringe with needle (BD LUER-LOK SYRINGE) 3 mL 25 gauge x 1 Syrg Use to inject Epogen under the skin every Tuesday and Friday as directed. 8 each 2   ??? HELIUM-OXYGEN INHL 2 L/kg/min by Each Nare route nightly.       No current facility-administered medications  for this visit.            Objective:   Objective     Diagnostic Review:    Cardiac testing summary:  TTE 12/08/18 (reviewed): EF >55%, LV mild dil,        RV mild dil, nl fxn, can't est PASP. IVC normal       Bubble study neg  TTE 09/25/18 (reviewed): LA 39, EF 60-65%, mild-mod MR, nl septal       RV dil hyperdynamic fxn, TAPSE 30, tr TR, est PASP 38+CVP       No effusion. IVC dil/poor insp collapse  RHC 06/2017: RA 10, PA 39/18 (26), PCW 16       CO/CI 10.3/5.7 (td) and 8.3/4.6 (f), PA sat 50%, PVR 1.0 (td), PAC 8.8       Hgb 4.7 during procedure  TTE 09/2016: LA 56, EF >55%, mild-mod MR       RV dil, normal fxn, mild Tr, can't est PASP        Normal septal position       No effusion. IVC dil/poor insp collapse  TTE 10/2015: LA 56, EF 60-65%, mild MR       RV normal, can't est PASP       No effusion. IVC dil/poor insp collapse  TTE 01/2015: EF 60-65%       RV mild dil/HK, mild TR, TRV suboptimal but est 36+CVP       No effusion. IVC dil/poor insp collapse    CTA chest 01/2015: no PE       Mild emphysema esp RUL. Stable 5mm RML nodule       Soft tissue mass, extrapleural, 38x17, increased in size, likely EMH       Bibasilar atelectasis/reticulations ?atelectasis     V/Q scan 12/2016: normal ventilation       Perfusion within normal limits. No e/o CTEPH.    02/2017: 457 meters. HR 66->88, O2 sat 100%->97% RA. Max Borg 3  11/2016: 367 meters. HR 78->98, O2 sat 94% RA->87% RA; 95% on 2L    Pulmonary Function Testing:  Date FEV1 FVC TLC FRC RV DLCO             11/2016  3.19 (96%)  3.88 (95%)     14.8 (47%)                Overnight oximetry 06/2018: 611 minutes <89%. Sat avg 80%, nadir 63%.     Relevant Serologies: none  Labs 06/2017: ANA pos 1:160 speckled and cytoplasmic, ENA neg, HIV neg    Routine Labs:   Lab Results   Component Value Date    NA 138 01/10/2019    K 5.3 (H) 01/10/2019    CL 110 (H) 01/10/2019    CO2 23.0 01/10/2019    BUN 15 01/10/2019    CREATININE 0.63 (L) 01/10/2019    GFR >= 60 11/21/2012    GLU 76 01/10/2019    CALCIUM 8.8 01/10/2019    ALBUMIN 4.1 01/10/2019    PHOS 4.4 12/06/2018     Lab Results   Component Value Date    WBC 7.5 01/10/2019    RBC 1.74 (L) 01/10/2019    HGB 6.4 (LL) 01/10/2019    HCT 19.4 (LL) 01/10/2019    MCV 111.9 (H) 01/10/2019    MCH 36.7 (H) 01/10/2019    MCHC 32.8 01/10/2019    RDW 21.7 (H) 01/10/2019    PLT 186 01/10/2019  Lab Results   Component Value Date    ALKPHOS 315 (H) 01/10/2019    BILITOT 5.3 (H) 01/10/2019    BILIDIR 2.40 (H) 01/10/2019    PROT 8.7 (H) 01/10/2019    ALBUMIN 4.1 01/10/2019    ALT 29 01/10/2019    AST 146 (H) 01/10/2019         Date NTproBNP            11/2018  870    08/2018 1040   06/2017  639   03/2017  328   04/2016  423   01/2016  614     Labs 11/2018: ABG 7.40/39/78, sat 88%  Labs 01/2015: ABG 7.36/35/69 on RA, sat 87%

## 2019-01-23 NOTE — Unmapped (Signed)
Called patient for previsit screening. Patient gave consent for video visit. Reviewed travel screen, allergies, and  Medications. Time spent on phone  12  minutes.

## 2019-02-08 NOTE — Unmapped (Signed)
Left a voicmail and sent my chart message for patient to call back to schedule a follow up appointment with Dr.Levarge in early September. - 3:01pm 02/08/2019 DC

## 2019-02-09 NOTE — Unmapped (Signed)
[name] reports painful to inject Procrit. Medication administration counseling provided: Counseled [name] that when ready to administer, inject syringe with a quick dart-like motion.  Inserting needle slowly could cause more discomfort.  Also, suggested to consider numbing the injection site with ice/ice pack few minutes prior to administering. to help alleviate pain/discomfort.  If that does not work, then can try a mild OTC numbing cream/lotion like benzocaine or lidocaine about 15-30 minutes prior to injecting to help numb area.  He reports pain is only upon inserting needle.        [facility] Clinical Assessment & Refill Coordination Note    [name] [name], DOB: Jun 10, 1971  Phone: [phone] (home) [phone] (work)    All above HIPAA information was verified with patient.     Specialty Medication(s):   Hematology/Oncology: Procrit     Current Outpatient Medications   Medication Sig Dispense Refill   ??? carBAMazepine (TEGRETOL  XR) 400 MG 12 hr tablet Take 2 tablets (800 mg total) by mouth Two (2) times a day. 120 tablet 11   ??? cholecalciferol, vitamin D3, (VITAMIN D3) 10 mcg (400 unit) cap Take 1 capsule (400 Units total) by mouth daily. 30 each 0   ??? diclofenac sodium (VOLTAREN) 1 % gel Apply 2 g topically daily as needed for arthritis.     ??? empty container (SHARPS CONTAINER) Misc use as directed 1 each 2   ??? epoetin alfa (EPOGEN,PROCRIT) 20,000 unit/mL injection Inject 1 mL (20,000 Units total) under the skin Every Tuesday and Friday. (Patient taking differently: Inject 20,000 Units under the skin Every Monday and Thursday. ) 12 mL 2   ??? folic acid (FOLVITE) 1 MG tablet Take 1 tablet (1 mg total) by mouth daily. 30 tablet 11   ??? OXYGEN-AIR DELIVERY SYSTEMS MISC 2 L/min by Each Nare route nightly. [employer]     ??? phenytoin (DILANTIN) 100 MG ER capsule Take 2 capsules (200 mg total) by mouth Two (2) times a day. 120 capsule 11   ??? syringe with needle (BD LUER-LOK SYRINGE) 3 mL 25 gauge x 1 Syrg Use to inject Epogen under the skin every Tuesday and Friday as directed. 8 each 2     No current facility-administered medications for this visit.         Changes to medications: [name] [name] reports no changes at this time.    No Known Allergies    Changes to allergies: No    SPECIALTY MEDICATION ADHERENCE     Procrit 20,000 units/1mL: 14 days of medicine on hand     Medication Adherence    Specialty Medication:  procrit 20,000 unit/ml  Patient is on additional specialty medications:  No          Specialty medication(s) dose(s) confirmed: Regimen is correct and unchanged.     Are there any concerns with adherence? No    Adherence counseling provided? Not needed    CLINICAL MANAGEMENT AND INTERVENTION      Clinical Benefit Assessment:    Do you feel the medicine is effective or helping your condition? Yes    Clinical Benefit counseling provided? Progress note from 01/10/19 shows evidence of clinical benefit    Adverse Effects Assessment:    Are you experiencing any side effects? No    Are you experiencing difficulty administering your medicine? Yes, patient reports painful to inject. Medication administration counseling provided: Counseled [name] that when ready to administer, inject syringe with a quick dart-like motion.  Inserting needle slowly could cause more discomfort.  Also, suggested to consider numbing the injection site with ice prior to administering. to help alleviate pain.  If that does not work, then can try a mild OTC numbing cream/lotion like benzocaine or lidocaine about 15 min-30 minutes prior to injecting to help numb area.  He reports pain is only upon inserting needle.      Quality of Life Assessment:    How many days over the past month did your sickle cell  keep you from your normal activities? For example, brushing your teeth or getting up in the morning. Patient declined to answer    Have you discussed this with your provider? Not needed Therapy Appropriateness:    Is therapy appropriate? Yes, therapy is appropriate and should be continued    DISEASE/MEDICATION-SPECIFIC INFORMATION      For patients on injectable medications: Patient currently has 4 doses left.  Next injection is scheduled for 02/12/19.    PATIENT SPECIFIC NEEDS     ? Does the patient have any physical, cognitive, or cultural barriers? No    ? Is the patient high risk? No     ? Does the patient require a Care Management Plan? No     ? Does the patient require physician intervention or other additional services (i.e. nutrition, smoking cessation, social work)? No      SHIPPING     Specialty Medication(s) to be Shipped:   Hematology/Oncology: Procrit    Other medication(s) to be shipped: syringes     Changes to insurance: No    Delivery Scheduled: Yes, Expected medication delivery date: 02/16/19.     Medication will be delivered via Same Day Courier to the confirmed home address in [facility].    The patient will receive a drug information handout for each medication shipped and additional FDA Medication Guides as required.  Verified that patient has previously received a Conservation officer, historic buildings.    All of the patient's questions and concerns have been addressed.    [name] [name] [facility] Specialty Pharmacist

## 2019-02-16 MED ORDER — VOXELOTOR 500 MG TABLET
ORAL_TABLET | Freq: Every day | ORAL | 0 refills | 0.00000 days | Status: CP
Start: 2019-02-16 — End: 2019-02-22

## 2019-02-16 MED FILL — BD LUER-LOK SYRINGE 3 ML 25 GAUGE X 1": 28 days supply | Qty: 8 | Fill #1 | Status: AC

## 2019-02-16 MED FILL — PROCRIT 20,000 UNIT/ML INJECTION SOLUTION: SUBCUTANEOUS | 28 days supply | Qty: 8 | Fill #2

## 2019-02-16 MED FILL — PROCRIT 20,000 UNIT/ML INJECTION SOLUTION: 28 days supply | Qty: 8 | Fill #2 | Status: AC

## 2019-02-16 MED FILL — BD LUER-LOK SYRINGE 3 ML 25 GAUGE X 1": 28 days supply | Qty: 8 | Fill #1

## 2019-02-17 NOTE — Unmapped (Signed)
Clinical Pharmacist Practitioner: Sickle Clinic     [name] [name] is a 48 y.o. male with hgb SS disease who I am calling today for follow-up of epoetin.    Current epoetin dose: 20,000 units two times weekly    Plan:  -Continue current epoetin dose    Counseled patient to :  -bring epoetin vials and syringes to appointment on Wednesday 7/1 for RN to troubleshoot on administration technique  -blood draw on 02/21/19      F/u: 02/21/19 with Dr Clarene Duke  ___________________________________________________________________    Interim history:  Patient had reported pain during epoetin administration to our [facility] pharmacist, and this had affected his adherence.     Today patient reported:  -missed 3 doses past 3 weeks, but not this week  -this week's doses on Monday and Thursday  -last blood draw 2-3 weeks ago  -still having pain, but better than before  -ice on injection site before administration (as recommended by [facility] pharmacist) did not reduce the pain of the administration      Epoetin started on 02/07/19 during hospital admission 6/15-4/22/20    Pharmacy: [address] [facility]  Medication Access: $3.90    Labs:      Approximate fime spent: 7 minutes     [name], PharmD, BCOP, CPP  Clinical Pharmacist Practitioner, Benign Hematology

## 2019-02-20 NOTE — Unmapped (Signed)
[facility] Specialty Medication Referral: PA Approved      Medication (Brand/Generic): OXBRYTA    Final Test Claim completed with resulted information below:    Patient ABLE to fill at [facility]  Insurance Company:  [employer]  Anticipated Copay: $0  Is anticipated copay with a copay card or grant? No, there is no need for grant or copay assistance.     Does this patient have to receive a partial fill of the medication due to insurance restrictions? NO  If so, please cofirm how many days supply is allowed per plan per fill and how long the patient will have to fill partial months supply for the medication: NOT APPLICABLE     If the copay is under the $25 defined limit, per policy there will be no further investigation of need for financial assistance at this time unless patient requests. This referral has been communicated to the provider and handed off to the [facility] team for further processing and filling of prescribed medication.   ______________________________________________________________________  Please utilize this referral for viewing purposes as it will serve as the central location for all relevant documentation and updates.

## 2019-02-20 NOTE — Unmapped (Signed)
Oxbryta prescription test claim.  Will wait to onboard until okay from Benign Heme clinic.    [name], PharmD  [facility]

## 2019-02-21 ENCOUNTER — Ambulatory Visit: Admit: 2019-02-21 | Discharge: 2019-02-21 | Payer: MEDICARE

## 2019-02-21 ENCOUNTER — Ambulatory Visit: Admit: 2019-02-21 | Discharge: 2019-02-21 | Payer: MEDICARE | Attending: Hematology | Primary: Hematology

## 2019-02-21 DIAGNOSIS — I429 Cardiomyopathy, unspecified: Secondary | ICD-10-CM

## 2019-02-21 DIAGNOSIS — D571 Sickle-cell disease without crisis: Principal | ICD-10-CM

## 2019-02-21 LAB — ALBUMIN: Albumin:MCnc:Pt:Ser/Plas:Qn:: 3.8

## 2019-02-21 LAB — CBC W/ AUTO DIFF
BASOPHILS ABSOLUTE COUNT: 0 10*9/L (ref 0.0–0.1)
BASOPHILS RELATIVE PERCENT: 0.4 %
EOSINOPHILS ABSOLUTE COUNT: 0.2 10*9/L (ref 0.0–0.4)
EOSINOPHILS RELATIVE PERCENT: 2.9 %
HEMOGLOBIN: 5.8 g/dL — CL (ref 13.5–17.5)
LARGE UNSTAINED CELLS: 5 % — ABNORMAL HIGH (ref 0–4)
LYMPHOCYTES ABSOLUTE COUNT: 2.8 10*9/L (ref 1.5–5.0)
LYMPHOCYTES RELATIVE PERCENT: 40.8 %
MEAN CORPUSCULAR HEMOGLOBIN CONC: 32.2 g/dL (ref 31.0–37.0)
MEAN CORPUSCULAR HEMOGLOBIN: 35.6 pg — ABNORMAL HIGH (ref 26.0–34.0)
MEAN CORPUSCULAR VOLUME: 110.6 fL — ABNORMAL HIGH (ref 80.0–100.0)
MEAN PLATELET VOLUME: 8.6 fL (ref 7.0–10.0)
MONOCYTES ABSOLUTE COUNT: 1.3 10*9/L — ABNORMAL HIGH (ref 0.2–0.8)
MONOCYTES RELATIVE PERCENT: 18.7 %
NEUTROPHILS ABSOLUTE COUNT: 2.3 10*9/L (ref 2.0–7.5)
NEUTROPHILS RELATIVE PERCENT: 32.7 %
NUCLEATED RED BLOOD CELLS: 4 /100{WBCs} (ref <=4)
PLATELET COUNT: 212 10*9/L (ref 150–440)
RED BLOOD CELL COUNT: 1.63 10*12/L — ABNORMAL LOW (ref 4.50–5.90)
RED CELL DISTRIBUTION WIDTH: 21 % — ABNORMAL HIGH (ref 12.0–15.0)
WBC ADJUSTED: 7 10*9/L (ref 4.5–11.0)

## 2019-02-21 LAB — SMEAR REVIEW

## 2019-02-21 LAB — SLIDE REVIEW

## 2019-02-21 LAB — BILIRUBIN DIRECT: Bilirubin.glucuronidated:MCnc:Pt:Ser/Plas:Qn:: 1.7 — ABNORMAL HIGH

## 2019-02-21 LAB — WBC ADJUSTED: Lab: 7

## 2019-02-21 LAB — C-REACTIVE PROTEIN: C reactive protein:MCnc:Pt:Ser/Plas:Qn:: 19.5 — ABNORMAL HIGH

## 2019-02-21 LAB — RETIC COUNT, MANUAL: Lab: 11.5 — ABNORMAL HIGH

## 2019-02-21 LAB — LACTATE DEHYDROGENASE: Lactate dehydrogenase:CCnc:Pt:Ser/Plas:Qn:: 4516 — ABNORMAL HIGH

## 2019-02-21 NOTE — Unmapped (Signed)
[place] [facility] Follow-up Note      Primary Care Physician:   Fredirick Maudlin, MD    Reason for Visit:  Follow up sickle cell anemia, Hgb SS    Assessment/Recommendations:   [name] [name] is a 48 y.o. year old African-American male who returns for follow-up of sickle cell anemia.      1.  Sickle cell anemia, Hgb SS: severe anemia with macrocytosis.  He might benefit from HU due to end organ damage but he has had neutropenia in past, which may be problematic.  In the past, he has stated he would not be willing to get monthly lab draws necessary to follow for evidence of bone marrow toxicity.  Further, this may be too complicated for him at this time.  Given significant anemia patient was started on epo in the hospital, and has been adherent, although he does not enjoy it. Hgb 5.8.  Given his persistent anemia, difficult transfusion status, and aversion to EPO, we will start him on Voxelator 1500 mg daily in the context of given his hyperhemolysis. F/u in one week for close clinical / lab monitoring.  Patient counseled on common toxicities of HA (recommended tylenol) and diarrhea (instructed to call if he experiences, would managed with imodium). Reinforced folate adherence.  2. Pulmonary HTN / Hypoxia: Is not wearing oxygen consistently at night, Most recent outpatient echo 09/25/2018 TRV 3.1, LA dilatation. Ejection fraction 60-65%, PASP 53  ?? Continue oxygen as prescribed. Currently on 2L at night. Uses only occasionally. Reinforced its use again today.  ?? F/u with pulmonary may be helpful, especially when we have a multi-disciplinary clinic  3. Proteinuria with hyperkalemia: Patient not a candidate for ACE inhibitor or ARB with distal RTA.   Elevated PTH suggests CKD. Has an appt with Dr. Tammi Klippel in nephrology on 03/13/19.  4. Health maintenance: Hep B sAb neg in past (never immunized), last PCV23 was 2010. Untransfusable, so may not be a priority at present.  Will also need to start Twinrix series in near future.   5. Seizures, last time 3-4 years ago, still on Tegretol and Dilantin. Seen by Dr. Larey Days this Spring.  6. Erectile dysfunction: Patient notes recent issues with erectile dysfunction.  Can consider trial of treatment pending stabilization of above issues.      ROS: B Pedal edema x 4 days, now resolved. No real changes in function. He relates to ongoing issues with dizziness, lightheadedness occasionally, about 2 times since last office visit.  Otherwise no real changes, using EPO BIW, though having issues with injection site pain.  Mental health Cheerful, sees family every day, parents and brothers, who he reports are doing well.  Headaches No  Visual Sxs None  Hearing OK, though with occasionally ringing in ears  Dental health OK  Fever/cough no CoVID, no fever, no cough  Ports in place? No  Constipation None  Palpitations None  SOB/DOE With limited exertion, not SOB  Pedal edema resolved  Leg ulcers None  Recent ED visits or hospitalizations None  Transfusion review: as noted below   Doesn't sleep well, uses O2 1-2/7 nights and doesn't notice a difference.  Priapism: Prior history of priapism.  None recently, now related to ED.  No nocturnal tumescence.  Pain and pain management, night-time sxs: No pain recently keeping him up at night.  Med adherence & review- Not taking folic acid, encouraged adherence.   Tegretol and Dilantin   Vit D  Patient seen and discussed with Dr. Clarene Duke.    Laurian Brim, MD  Hematology and Oncology Fellow         FOLLOW UP:     History of Present Illness:   [name] [name] is a 47 y.o. year old African-American male last actually seen in clinic on 01/10/19.    To review, when he was seen in clinic on 12/06/2018, he presented with symptomatic anemia. He was found to be hypoxemic (86% on room air)  with a hgb of 5.4 He was sent ED and admitted for further evaluation and treatment.He was started on epo during course of hospitalization.In addition, he had some weakness, diplopia, vertigo while admitted with unclear etiology. He had MRI completed that was negative for any acute changes.    He is seen again today in person, 02/21/19, and has a non-transfused Hgb of 5.8, macrocytosis off Hydrea, on BIW EPO at home. Patient reports that he is no longer experiencing weakness and dizziness.  He complains of pain with injection of EPO, though he denies missed doses.  He otherwise denies symptomatic complaints as covered in ROS as above.  He sees his family daily.        Sickle Cell Disease History:  Access: peripheral IV  1.  Genotype: SS (hemolysis: hemoglobin 6 , ARC 100-200 , LDH 3000-5000 , Bilirubin 3-5 , urobilinogen 0.2-2 )  Concomitant alpha thal: ?  Other genetic studies: no  BMT?:  no  Folic acid: yes  Hydroxyurea: was on 500mg /day but with mild neutropenia (1200) and minimal F elevation; patient with h/o telling different stories to different providers - in setting of neutropenia and renal dysfunction and minimal improvement in %F, we have not resumed Hydrea  Studies/protocols: no   2. Pain (typical location): rare - legs, feet  Hospitalization frequency:  rare  Outpatient regimen: tylenol  24h Morphine Equivalent Dose (Goal <100): 0  Bowel regimen: none  Inpatient regimen: ?   2. Transfusions: yes per previous notes but on 11/11/17 patient said 'never'  Life time blood transfusions: 6+  Last transfusion: 02/2018   History of DHTR?: unlikely  Alloantibodies: no  Iron status: ferritin 259 (11/11/17)  Medications: no   3. CV/Pulm: chronic hypoxia ( improved on HU per previous notes), supposed to be on o2 at night (2LPM); on 11/11/17 states he can 'walk 1/2 mile without getting short of breath'  Acute chest syndrome: no  Need for mechanical ventilation?: no  Pulmonary hypertension: yes, h/o hypoxia - NYHA 2 and ? WHO group 5; RHC 06/2017 - mild PxHTN with PAmean = 26, PCW mean of 16 and increased CO, normal PVR 1.2 wood units   4. Neuro:  Stroke/TIA: no  Imaging: Other: epilepsy diagnosed at 48yo, on carbamazepine and phenytoin; follows regularly with neurology   5. Psych: cognitive delay  Concerns for depression/anxiety?: no  Concerns for substance dependence?: no  Medications:    6. ID/Immune:  Splenectomy?: no  Antibiotics: no  Immunization history:   Immunization History   Administered Date(s) Administered   ??? Hepatitis B, Adult 03/25/2009, 04/24/2009   ??? INFLUENZA TIV (TRI) PF (IM) 07/21/2009, 09/12/2012   ??? Influenza Vaccine Quad (IIV4 PF) 21mo+ injectable 07/04/2015, 05/07/2016, 04/20/2018   ??? Influenza Virus Vaccine, unspecified formulation 10/11/2018   ??? Meningococcal C Conjugate 03/27/2013   ??? PNEUMOCOCCAL POLYSACCHARIDE 23 03/25/2009, 11/11/2017   ??? Pneumococcal Conjugate 13-Valent 07/04/2015     ANA: positive 1:160 on 06/2017  HIV neg 06/2017  Hep BsAb neg 04/2016  7. GI/Hep:  Cholecystectomy?: no  Hepatopathy?: ? - enlarged on u/s 01/2016  Other: no   8. 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 GU:  Priapism?: yes, remote - last at 48yo per notes   10. M/sk:  AVN?: no  Ulcers?: no  Other: left iliac crest fracture (? 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 Endocrine:   Bone/vitamin D:   Other:    13. Screening:   Ophtho/Retinopathy?: neg 09/2017  Audiology?: no  Dental?: yes   14. Social: watches TV all day, lives alone, drives  Education: high school   Work/Disability:   Smoking: cigars 2x/day  Alcohol:   Other:      Surgical History:  Past Surgical History:   Procedure Laterality Date   ??? PR RIGHT HEART CATH O2 SATURATION & CARDIAC OUTPUT N/A 07/18/2017    Procedure: Right Heart Catheterization;  Surgeon: Zannie Cove, MD;  Location: [facility] CATH;  Service: Cardiology Medications:   Current Outpatient Medications   Medication Sig Dispense Refill   ??? carBAMazepine (TEGRETOL  XR) 400 MG 12 hr tablet Take 2 tablets (800 mg total) by mouth Two (2) times a day. 120 tablet 11   ??? cholecalciferol, vitamin D3, (VITAMIN D3) 10 mcg (400 unit) cap Take 1 capsule (400 Units total) by mouth daily. 30 each 0   ??? diclofenac sodium (VOLTAREN) 1 % gel Apply 2 g topically daily as needed for arthritis.     ??? empty container (SHARPS CONTAINER) Misc use as directed 1 each 2   ??? epoetin alfa (EPOGEN,PROCRIT) 20,000 unit/mL injection Inject 1 mL (20,000 Units total) under the skin Every Tuesday and Friday. (Patient taking differently: Inject 20,000 Units under the skin Every Monday and Thursday. ) 12 mL 2   ??? folic acid (FOLVITE) 1 MG tablet Take 1 tablet (1 mg total) by mouth daily. 30 tablet 11   ??? OXYGEN-AIR DELIVERY SYSTEMS MISC 2 L/min by Each Nare route nightly. [employer]     ??? phenytoin (DILANTIN) 100 MG ER capsule Take 2 capsules (200 mg total) by mouth Two (2) times a day. 120 capsule 11   ??? syringe with needle (BD LUER-LOK SYRINGE) 3 mL 25 gauge x 1 Syrg Use to inject Epogen under the skin every Tuesday and Friday as directed. 8 each 2   ??? voxelotor 500 mg Tab Take 3 tablets (1,500 mg) by mouth daily. (Patient not taking: Reported on 02/21/2019) 90 tablet 0     No current facility-administered medications for this visit.      Allergies:  Patient has no known allergies.    Social History:  Social History     Socioeconomic History   ??? Marital status: Single     Spouse name: None   ??? Number of children: 0   ??? Years of education: None   ??? Highest education level: None   Occupational History   ??? Occupation: disability   Social Needs   ??? Financial resource strain: None   ??? Food insecurity     Worry: None     Inability: None   ??? Transportation needs     Medical: None     Non-medical: None   Tobacco Use   ???  Smoking status: Current Some Day Smoker     Packs/day: 1.00     Years: 0.50     Pack years: 0.50 Types: Cigars   ??? Smokeless tobacco: Never Used   ??? Tobacco comment: Pt smokes 1-2 cigars daily, Pt intends to cease tobacco use    Substance and Sexual Activity   ??? Alcohol use: Not Currently     Alcohol/week: 0.0 standard drinks     Drinks per session: 1 or 2     Binge frequency: Never     Comment: occasional   ??? Drug use: No   ??? Sexual activity: None   Lifestyle   ??? Physical activity     Days per week: None     Minutes per session: None   ??? Stress: None   Relationships   ??? Social Wellsite geologist on phone: None     Gets together: None     Attends religious service: None     Active member of club or organization: None     Attends meetings of clubs or organizations: None     Relationship status: None   Other Topics Concern   ??? None   Social History Narrative   ??? None     Family History:  family history includes Clotting disorder in his paternal grandmother; Hypertension in his father and mother; No Known Problems in his brother, brother, brother, brother, maternal aunt, maternal grandfather, maternal grandmother, maternal uncle, paternal aunt, paternal grandfather, paternal uncle, sister, sister, and another family member; Sickle cell anemia in his sister.   He indicated that his mother is alive. He indicated that his father is alive. He indicated that two of his three sisters are alive. He indicated that all of his four brothers are alive. He indicated that the status of his maternal grandmother is unknown. He indicated that the status of his maternal grandfather is unknown. He indicated that the status of his paternal grandmother is unknown. He indicated that the status of his paternal grandfather is unknown. He indicated that the status of his maternal aunt is unknown. He indicated that the status of his maternal uncle is unknown. He indicated that the status of his paternal aunt is unknown. He indicated that the status of his paternal uncle is unknown. He indicated that the status of his neg hx is unknown. He indicated that the status of his other is unknown.    Review of Systems:  As per HPI, otherwise negative x 10 systems.    Objective :   Vitals:    02/21/19 1346   BP: 103/57   Pulse: 57   Resp: 16   Temp: 36.8 ??C (98.3 ??F)   SpO2: 98%   Weight: 63.5 kg (140 lb)   Height: 170.2 cm (5' 7.01)     Physical Exam:    Clubbing, especially index fingers.  CV RRR inc P2 lungs clear. Lungs clear. No pedal edema. Gait normal. Facial symmetry    Gen - Masked. Appropriate affect. Conversant.  Eyes - Muddy sclera, no conjunctival injection  Resp - Lungs clear, normal WOB  CV - RRR, prominent P2, systolic murmur, warm, well perfused, no cyanosis  GI - abdomen nondistended, soft, non-tender  Skin - no rashes, no lesions noted on visible skin  Neuro - Alert and oriented to conversation, EOM intact, no facial droop, speech fluent and coherent, moves all extremities without asymmetry and with at least antigravity strength  Psych - Normal affect  MSK -  no joint swelling or effusions noted      Test Results:    Lab Results   Component Value Date    WBC 7.0 02/21/2019    HGB 5.8 (LL) 02/21/2019    HCT 18.0 (LL) 02/21/2019    PLT 212 02/21/2019       Lab Results   Component Value Date    NA 138 01/10/2019    K 5.3 (H) 01/10/2019    CL 110 (H) 01/10/2019    CO2 23.0 01/10/2019    BUN 15 01/10/2019    CREATININE 0.63 (L) 01/10/2019    GLU 76 01/10/2019    CALCIUM 8.8 01/10/2019    MG 1.8 12/06/2018    PHOS 4.4 12/06/2018       Lab Results   Component Value Date    BILITOT 5.3 (H) 01/10/2019    BILIDIR 2.40 (H) 01/10/2019    PROT 8.7 (H) 01/10/2019    ALBUMIN 4.1 01/10/2019    ALT 29 01/10/2019    AST 146 (H) 01/10/2019    ALKPHOS 315 (H) 01/10/2019    GGT 412 (H) 12/11/2018       Lab Results   Component Value Date    INR 1.21 12/13/2018    APTT 30.3 03/02/2018

## 2019-02-21 NOTE — Unmapped (Addendum)
It was a pleasure to see you today in clinic!    Plan from today's visit  ? Follow up in 1 week  ? We recommend you resume folic acid 1 mg per day, which can be obtained over the counter.  ? We will give you a dose of EPO today  ? We recommend using the oxygen as prescribed by the lung doctors every night, as this is very important for keeping your red cells from sickling.  ? We otherwise recommend starting voxelotor, a medicine that helps your red cells holding on to oxygen.  We would need to closely monitor you with starting this.  o We would want to see you every week for the next 4 weeks    We will plan on an HIB shot in 1-2 weeks!     How to get in touch with your Sickle Cell Team:     1. If you are sick, and need an ambulance, CALL 911.    ? Otherwise, call [facility] at [phone], 8AM - 5PM, and ask for the NURSE TRIAGE LINE and leave a message   ? On nights and weekends, for urgent problems you can call the [phone] and ask for the HEMATOLOGIST ON CALL.     2.  For an appointment, please call [facility] at [phone], 8AM - 5PM.    3.  For pain medication refills, leave a message on nurse triage line THREE days before refill needed. All other medication refills ask your pharmacy to fax request.       4.  For general questions, call the sickle cell office, [phone]. You can leave non-urgent messages here as well.    5.  For social work questions or needs; Please call Dickie La RN, MSW, MPH at [phone]    Information about Coronavirus Prevention for Patients with Sickle Cell Disease.    What you can do to protect yourself???    Avoid close contact with people who are sick.  No handshakes or kissing.  No sharing of utensils.  Keep a distance of at least 6 feet apart.  Wash your hands often with soap and water for at least 20 seconds.  Cover your coughs and sneezes with a tissue and then throw the tissue in the trash.  Avoid touching your eyes, nose and mouth.  Clean and disinfect frequently touched objects and surfaces.    What we can do to help you???    Avoid going directly to the ED for pain or minor complaints  The ED is very crowded and you may be exposed to the virus and/or your care may be delayed.  We may be able to treat your pain in clinic instead of the ED, on a case-by-case basis. Call us at [facility] at [phone], 8AM - 5PM, to discuss    Stay home when you are sick and call us for additional support.    1. If you are sick, and need an ambulance, CALL 911.     2. During regular office hours, Monday to Friday (8am- 5pm), call [facility] at [phone], 8AM - 5PM, and ask for the NURSE TRIAGE LINE and leave a message    3. On nights and weekends, for urgent problems you can call the [phone] and ask for the HEMATOLOGIST ON CALL.    4. If you feel you may have been exposed to COVID-19, please reach out to your primary care provider or the sickle cell team via telephone.  For more information please refer  to:    [email or web address]    or the COVID Helpline: [phone].     [Based on a templte from [employer] at [facility] in the [place]]

## 2019-02-22 MED ORDER — VOXELOTOR (OXBRYTA) 500 MG TABLET
ORAL_TABLET | Freq: Every day | ORAL | 0 refills | 0.00000 days | Status: CP
Start: 2019-02-22 — End: 2019-03-08
  Filled 2019-02-26: qty 90, 30d supply, fill #0

## 2019-02-22 NOTE — Unmapped (Signed)
The patient declined counseling on medication administration, missed dose instructions, goals of therapy, side effects and monitoring parameters, warnings and precautions, drug/food interactions and storage, handling precautions, and disposal because they were counseled in clinic. The information in the declined sections below are for informational purposes only and was not discussed with patient.     [facility]   Patient Onboarding/Medication Counseling    [name] [name] is a 48 y.o. male with sickle cell anemia who I am counseling today on initiation of therapy.  I am speaking to the patient.    Verified patient's date of birth / HIPAA.    Specialty medication(s) to be sent: Hematology/Oncology: Gardenia Phlegm - voxeletor 500mg       Non-specialty medications/supplies to be sent:       Medications not needed at this time: none         Oxbryta (Voxelotor)    Medication & Administration     Dosage: Take 3 tablets (1,500 mg) by mouth daily.    Administration:   ? May take with or without food  ? Swallow tablet whole, do not cut, crush, or chew    Adherence/Missed dose instructions:  ? Skip the missed dose and go back to your normal time.  ? Do not take 2 doses at the same time or extra doses    Goals of Therapy     ? It is used to treat sickle cell disease    Side Effects & Monitoring Parameters     ? Common Side Effects:  ? Headache.  ? Stomach pain or diarrhea.  ? Upset stomach.  ? Feeling tired or weak.  ? Mild fever.    ? The following side effects should be reported to the provider:  ? Allergic reaction (rash; hives; itching; red, wheezing; trouble breathing, swallowing, or talking; or swelling of the mouth, face, lips, tongue, or throat    Contraindications, Warnings, & Precautions     ? Breastfeeding is not recommended by the manufacturer during treatment and for at least 2 weeks after the last dose    Drug/Food Interactions     ? Medication list reviewed in Epic. The patient was instructed to inform the care team before taking any new medications or supplements. Phenytoin and Tegretol may decrease the serum concentration of Voxelotor (Risk category D).     Storage, Handling Precautions, & Disposal     ? Store at room temperature      Current Medications (including OTC/herbals), Comorbidities and Allergies     Current Outpatient Medications   Medication Sig Dispense Refill   ??? carBAMazepine (TEGRETOL  XR) 400 MG 12 hr tablet Take 2 tablets (800 mg total) by mouth Two (2) times a day. 120 tablet 11   ??? cholecalciferol, vitamin D3, (VITAMIN D3) 10 mcg (400 unit) cap Take 1 capsule (400 Units total) by mouth daily. 30 each 0   ??? diclofenac sodium (VOLTAREN) 1 % gel Apply 2 g topically daily as needed for arthritis.     ??? empty container (SHARPS CONTAINER) Misc use as directed 1 each 2   ??? epoetin alfa (EPOGEN,PROCRIT) 20,000 unit/mL injection Inject 1 mL (20,000 Units total) under the skin Every Tuesday and Friday. (Patient taking differently: Inject 20,000 Units under the skin Every Monday and Thursday. ) 12 mL 2   ??? folic acid (FOLVITE) 1 MG tablet Take 1 tablet (1 mg total) by mouth daily. 30 tablet 11   ??? OXYGEN-AIR DELIVERY SYSTEMS MISC 2 L/min by Each  Nare route nightly. [employer]     ??? phenytoin (DILANTIN) 100 MG ER capsule Take 2 capsules (200 mg total) by mouth Two (2) times a day. 120 capsule 11   ??? syringe with needle (BD LUER-LOK SYRINGE) 3 mL 25 gauge x 1 Syrg Use to inject Epogen under the skin every Tuesday and Friday as directed. 8 each 2   ??? voxelotor 500 mg Tab Take 3 tablets (1,500 mg) by mouth daily. 90 tablet 0     No current facility-administered medications for this visit.        No Known Allergies    Patient Active Problem List   Diagnosis   ??? Hyperpotassemia   ??? 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 Sickle cell anemia (CMS-HCC)   ??? Hyperkalemia   ??? Proteinuria   ??? Vitamin D deficiency   ??? Seizure disorder (CMS-HCC)   ??? Pain of left hip joint   ??? Ear infection ??? Sickle cell crisis (CMS-HCC)   ??? Pulmonary HTN (CMS-HCC)   ??? Anemia   ??? Edema   ??? Hypoxemia   ??? Tobacco use disorder   ??? High output heart failure (CMS-HCC)   ??? Vertigo   ??? Hyperbilirubinemia   ??? Nausea & vomiting       Reviewed and up to date in Epic.    Appropriateness of Therapy     Is medication and dose appropriate based on diagnosis? Yes    Baseline Quality of Life Assessment      How many days over the past month did your sickle cell disease keep you from your normal activities? 0    Financial Information     Medication Assistance provided: Prior Authorization    Anticipated copay of $0 reviewed with patient. Verified delivery address.    Delivery Information     Scheduled delivery date: 02/26/19    Expected start date: 02/26/19    Medication will be delivered via Same Day Courier to the home address in [place].  This shipment will not require a signature.      Explained the services we provide at [facility] and that each month we would call to set up refills.  Stressed importance of returning phone calls so that we could ensure they receive their medications in time each month.  Informed patient that we should be setting up refills 7-10 days prior to when they will run out of medication.  A pharmacist will reach out to perform a clinical assessment periodically.  Informed patient that a welcome packet and a drug information handout will be sent.      Patient verbalized understanding of the above information as well as how to contact the pharmacy at [phone] option 4 with any questions/concerns.  The pharmacy is open Monday through Friday 8:30am-4:30pm.  A pharmacist is available 24/7 via pager to answer any clinical questions they may have.    Patient Specific Needs     ? Does the patient have any physical, cognitive, or cultural barriers? No    ? Patient prefers to have medications discussed with  Patient     ? Is the patient able to read and understand education materials at a high school level or above? Yes    ? Patient's primary language is  English     ? Is the patient high risk? No     ? Does the patient require a Care Management Plan? No     ? Does  the patient require physician intervention or other additional services (i.e. nutrition, smoking cessation, social work)? No      Mariene Dickerman [facility] Specialty Pharmacist

## 2019-02-22 NOTE — Unmapped (Signed)
Clinical Pharmacist Practitioner: [facility]     [name] [name] is a 48 y.o. male with hgb SS disease who I am seeing today for voxelotor counseling.    Counseled patient on  -voxelotor therapeutic rationale, adherence/missed doses, cost of medications/cost implications, dose and administration, lab monitoring/follow-up, adverse effects and management, drug-drug interaction, drug-food interaction or safe handling, storage and disposal    -Adverse effects:  ---diarrhea: call if >1 episode, may consider using imodium, if diarrhea > 3 episodes in a day/or if clinically indicated (dehydrated, sickle crisis etc, may consider dose reduction)  ---Headache: acetaminophen prn    -Drug-drug interaction  ---Complicated, and unable to predict as patient is on two 3A4 inducers, phenytoin and carbamazepine, and they both have the potential to decrease each other's concentrations, so effect on the reduction of voxelotor concentration is unknown in this patient  ---Continue to monitor, low threshold to increase voxelotor to 2500 mg once daily, per manufacturer's recommendation, if no hgb increase in 2 weeks     -Importance in adherence (patient has pillbox)    Medication access: copay $0    Prescriptions:  -voxelotor 500 mg #90 (30 days supply, 0 refills)  -prescriptions sent electronically to patient's preferred pharmacy [place]  -ETA to medication receipt: Tuesday 02/27/19    Provided patient medication dosing calendar to document administration, and monitor adherence/missing doses    [name] [name] verbalized understanding of the education provided and had no further questions.     F/u: 1 week  With Dr Clarene Duke  ___________________________________________________________________       Approximate face time spent with patient: 20 minutes     [name], PharmD, BCOP, CPP  Clinical Pharmacist Practitioner, [facility]

## 2019-02-26 MED FILL — VOXELOTOR (OXBRYTA) 500 MG TABLET: 30 days supply | Qty: 90 | Fill #0 | Status: AC

## 2019-03-02 DIAGNOSIS — D571 Sickle-cell disease without crisis: Secondary | ICD-10-CM

## 2019-03-02 DIAGNOSIS — L309 Dermatitis, unspecified: Principal | ICD-10-CM

## 2019-03-02 LAB — HEPATIC FUNCTION PANEL
ALBUMIN: 3.6 g/dL (ref 3.5–5.0)
AST (SGOT): 119 U/L — ABNORMAL HIGH (ref 17–47)
BILIRUBIN DIRECT: 1.9 mg/dL — ABNORMAL HIGH (ref 0.00–0.40)
BILIRUBIN TOTAL: 4.1 mg/dL — ABNORMAL HIGH (ref 0.0–1.2)
PROTEIN TOTAL: 7.7 g/dL (ref 6.5–8.3)

## 2019-03-02 LAB — CBC W/ AUTO DIFF
BASOPHILS ABSOLUTE COUNT: 0 10*9/L (ref 0.0–0.1)
BASOPHILS RELATIVE PERCENT: 0.6 %
EOSINOPHILS ABSOLUTE COUNT: 0.3 10*9/L (ref 0.0–0.4)
EOSINOPHILS RELATIVE PERCENT: 4.6 %
HEMATOCRIT: 17.3 % — CL (ref 41.0–53.0)
HEMOGLOBIN: 5.6 g/dL — CL (ref 13.5–17.5)
LARGE UNSTAINED CELLS: 5 % — ABNORMAL HIGH (ref 0–4)
LYMPHOCYTES ABSOLUTE COUNT: 2.4 10*9/L (ref 1.5–5.0)
LYMPHOCYTES RELATIVE PERCENT: 43 %
MEAN CORPUSCULAR HEMOGLOBIN CONC: 32.3 g/dL (ref 31.0–37.0)
MEAN CORPUSCULAR VOLUME: 110.8 fL — ABNORMAL HIGH (ref 80.0–100.0)
MEAN PLATELET VOLUME: 8.3 fL (ref 7.0–10.0)
MONOCYTES ABSOLUTE COUNT: 0.8 10*9/L (ref 0.2–0.8)
MONOCYTES RELATIVE PERCENT: 15 %
NEUTROPHILS RELATIVE PERCENT: 31.7 %
NUCLEATED RED BLOOD CELLS: 18 /100{WBCs} — ABNORMAL HIGH (ref <=4)
PLATELET COUNT: 206 10*9/L (ref 150–440)
RED BLOOD CELL COUNT: 1.56 10*12/L — ABNORMAL LOW (ref 4.50–5.90)
RED CELL DISTRIBUTION WIDTH: 22 % — ABNORMAL HIGH (ref 12.0–15.0)
WBC ADJUSTED: 5.6 10*9/L (ref 4.5–11.0)

## 2019-03-02 LAB — POLYCHROMASIA

## 2019-03-02 LAB — SLIDE REVIEW

## 2019-03-02 LAB — LACTATE DEHYDROGENASE: Lactate dehydrogenase:CCnc:Pt:Ser/Plas:Qn:: 4422 — ABNORMAL HIGH

## 2019-03-02 LAB — BASIC METABOLIC PANEL
ANION GAP: 6 mmol/L — ABNORMAL LOW (ref 7–15)
BLOOD UREA NITROGEN: 19 mg/dL (ref 7–21)
CALCIUM: 8.7 mg/dL (ref 8.5–10.2)
CHLORIDE: 109 mmol/L — ABNORMAL HIGH (ref 98–107)
CO2: 26 mmol/L (ref 22.0–30.0)
CREATININE: 0.83 mg/dL (ref 0.70–1.30)
EGFR CKD-EPI AA MALE: >90 mL/min/{1.73_m2} (ref >=60)
EGFR CKD-EPI NON-AA MALE: >90 mL/min/{1.73_m2} (ref >=60)
GLUCOSE RANDOM: 108 mg/dL (ref 70–179)
POTASSIUM: 4.7 mmol/L (ref 3.5–5.0)
SODIUM: 141 mmol/L (ref 135–145)

## 2019-03-02 LAB — RETIC COUNT, MANUAL: Lab: 8.6 — ABNORMAL HIGH

## 2019-03-02 LAB — HYPOCHROMIA

## 2019-03-02 LAB — AST (SGOT): Aspartate aminotransferase:CCnc:Pt:Ser/Plas:Qn:: 119 — ABNORMAL HIGH

## 2019-03-02 LAB — BLOOD UREA NITROGEN: Urea nitrogen:MCnc:Pt:Ser/Plas:Qn:: 19

## 2019-03-02 MED ORDER — NYSTATIN-TRIAMCINOLONE 100,000 UNIT/GRAM-0.1 % TOPICAL OINTMENT
Freq: Two times a day (BID) | TOPICAL | 0 refills | 0.00000 days | Status: CP
Start: 2019-03-02 — End: 2020-03-01

## 2019-03-02 NOTE — Unmapped (Signed)
[facility] Follow-up Note      Primary Care Physician:   Fredirick Maudlin, MD    Reason for Visit:  Follow up sickle cell anemia, Hgb SS    Assessment/Recommendations:   [name] [name] is a 48 y.o. year old African-American male who returns for follow-up of sickle cell anemia.      1.  Sickle cell anemia, Hgb SS: severe anemia with macrocytosis. Not on hydrea through choice and borderline baseline neutrophils. Starting on Voxelator 1500 mg daily in the context of given his hyperhemolysis. F/u in one week for close clinical / lab monitoring.  Continue EPO. Patient counseled on common toxicities of HA (recommended tylenol) and diarrhea (instructed to call if he experiences, would managed with imodium). Reinforced folate adherence.  2. Pulmonary HTN / Hypoxia: Is not wearing oxygen consistently at night, Most recent outpatient echo 09/25/2018 TRV 3.1, LA dilatation. Ejection fraction 60-65%, PASP 53  ?? Continue oxygen as prescribed. Currently on 2-4L at night. Uses only occasionally. Reinforced its use again today.  ?? F/u with pulmonary may be helpful, especially when we have a multi-disciplinary clinic  3. Proteinuria with intermittent hyperkalemia and low blood pressure: Patient not a candidate for ACE inhibitor or ARB at present.   Elevated PTH suggests CKD. Has an appt with Dr. Tammi Klippel in nephrology on 03/13/19.  4. Health maintenance: Hep B sAb neg in past (never immunized), last PCV23 was 2010. Untransfusable, so may not be a priority at present.  Will also need to start Twinrix series in near future.   5. Seizures, last time 3-4 years ago, still on Tegretol and Dilantin. Seen by Dr. Larey Days this Spring. Not reviewed today  6. Erectile dysfunction: Patient notes recent issues with erectile dysfunction.  Can consider trial of treatment pending stabilization of above issues.    Headaches No  Visual in the past, Sxs None  Hearing in the past, occasionally ringing in ears  Dental health OK Fever/cough no CoVID, no fever, no cough  Ports in place? No  Constipation in the past, None  Palpitations in the past, None  SOB/DOE With limited exertion, not SOB  Pedal edema resolved  Leg ulcers None  Recent ED visits or hospitalizations None  Transfusion review: as noted below   Doesn't sleep well, uses O2 at night. We will review adherence at RTC  Priapism: in the past, Prior history of priapism.  None recently, likely related to ED.    NEW painful scrotal rash x 3 days. He is not sexually active  Pain and pain management, night-time sxs: in the past, No pain recently keeping him up at night.    Med adherence & review- Not taking folic acid, encouraged adherence.   Tegretol and Dilantin   Vit D  EPO BIW-needle too long, readjusted    FOLLOW UP:     History of Present Illness:   [name] [name] is a 48 y.o. year old African-American male last actually seen in clinic on March 02 2019    To review, when he was seen in clinic on 12/06/2018, he presented with symptomatic anemia. He was found to be hypoxemic (86% on room air)  with a hgb of 5.4 He was sent ED and admitted for further evaluation and treatment.He was started on epo during course of hospitalization.In addition, he had some weakness, diplopia, vertigo while admitted with unclear etiology. He had MRI completed that was negative for any acute changes.    He is seen  again today in person, 03/02/19, and has a non-transfused Hgb of 5.6, macrocytosis off Hydrea, on BIW EPO at home. No headache no diarrhea. Started voxelator 2 days ago. No change in exercise tolerance. No fevers, cough, or SOB. He is 'getting around', but wears his mask. Sees his mother daily. Has had pain in his groin and scrotum x 2-3 days.  He is not sexually active. No fevers.    Sickle Cell Disease History:  Access: peripheral IV  1.  Genotype: SS (hemolysis: hemoglobin 6 , ARC 100-200 , LDH 3000-5000 , Bilirubin 3-5 , urobilinogen 0.2-2 )  Concomitant alpha thal: ?  Other genetic studies: no  BMT?:  no  Folic acid: yes  Hydroxyurea: was on 500mg /day but with mild neutropenia (1200) and minimal F elevation; patient with h/o telling different stories to different providers - in setting of neutropenia and renal dysfunction and minimal improvement in %F, we have not resumed Hydrea  Studies/protocols: no   2. Pain (typical location): rare - legs, feet  Hospitalization frequency:  rare  Outpatient regimen: tylenol  24h Morphine Equivalent Dose (Goal <100): 0  Bowel regimen: none  Inpatient regimen: ?   2. Transfusions: yes per previous notes but on 11/11/17 patient said 'never'  Life time blood transfusions: 6+  Last transfusion: 02/2018   History of DHTR?: unlikely  Alloantibodies: no  Iron status: ferritin 259 (11/11/17)  Medications: no   3. CV/Pulm: chronic hypoxia ( improved on HU per previous notes), supposed to be on o2 at night (2LPM); on 11/11/17 states he can 'walk 1/2 mile without getting short of breath'  Acute chest syndrome: no  Need for mechanical ventilation?: no  Pulmonary hypertension: yes, h/o hypoxia - NYHA 2 and ? WHO group 5; RHC 06/2017 - mild PxHTN with PAmean = 26, PCW mean of 16 and increased CO, normal PVR 1.2 wood units   4. Neuro:  Stroke/TIA: no  Imaging:     Other: epilepsy diagnosed at 48yo, on carbamazepine and phenytoin; follows regularly with neurology   5. Psych: cognitive delay  Concerns for depression/anxiety?: no  Concerns for substance dependence?: no  Medications:    6. ID/Immune:  Splenectomy?: no  Antibiotics: no  Immunization history:   Immunization History   Administered Date(s) Administered   ??? Hepatitis B, Adult 03/25/2009, 04/24/2009   ??? INFLUENZA TIV (TRI) PF (IM) 07/21/2009, 09/12/2012   ??? Influenza Vaccine Quad (IIV4 PF) 64mo+ injectable 07/04/2015, 05/07/2016, 04/20/2018   ??? Influenza Virus Vaccine, unspecified formulation 10/11/2018   ??? Meningococcal C Conjugate 03/27/2013   ??? PNEUMOCOCCAL POLYSACCHARIDE 23 03/25/2009, 11/11/2017   ??? Pneumococcal Conjugate 13-Valent 07/04/2015     ANA: positive 1:160 on 06/2017  HIV neg 06/2017  Hep BsAb neg 04/2016   7. GI/Hep:  Cholecystectomy?: no  Hepatopathy?: ? - enlarged on u/s 01/2016  Other: no   8. 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 GU:  Priapism?: yes, remote - last at 48yo per notes   10. M/sk:  AVN?: no  Ulcers?: no  Other: left iliac crest fracture (? 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 Endocrine:   Bone/vitamin D:   Other:    13. Screening:   Ophtho/Retinopathy?: neg 09/2017  Audiology?: no  Dental?: yes   14. Social: watches TV all day, lives alone, drives  Education: high school   Work/Disability:   Smoking: cigars 2x/day  Alcohol:   Other:      Surgical History:  Past Surgical History:   Procedure Laterality Date   ??? PR RIGHT HEART CATH O2 SATURATION & CARDIAC OUTPUT N/A 07/18/2017    Procedure: Right Heart Catheterization;  Surgeon: Zannie Cove, MD;  Location: [facility] CATH;  Service: Cardiology       Medications:   Current Outpatient Medications   Medication Sig Dispense Refill   ??? carBAMazepine (TEGRETOL  XR) 400 MG 12 hr tablet Take 2 tablets (800 mg total) by mouth Two (2) times a day. 120 tablet 11   ??? cholecalciferol, vitamin D3, (VITAMIN D3) 10 mcg (400 unit) cap Take 1 capsule (400 Units total) by mouth daily. 30 each 0   ??? diclofenac sodium (VOLTAREN) 1 % gel Apply 2 g topically daily as needed for arthritis.     ??? empty container (SHARPS CONTAINER) Misc use as directed 1 each 2   ??? epoetin alfa (EPOGEN,PROCRIT) 20,000 unit/mL injection Inject 1 mL (20,000 Units total) under the skin Every Tuesday and Friday. (Patient taking differently: Inject 20,000 Units under the skin Every Monday and Thursday. ) 12 mL 2   ??? folic acid (FOLVITE) 1 MG tablet Take 1 tablet (1 mg total) by mouth daily. 30 tablet 11   ??? OXYGEN-AIR DELIVERY SYSTEMS MISC 2 L/min by Each Nare route nightly. [employer]     ??? phenytoin (DILANTIN) 100 MG ER capsule Take 2 capsules (200 mg total) by mouth Two (2) times a day. 120 capsule 11   ??? promethazine (PHENERGAN) 12.5 MG tablet Take 12.5 mg by mouth every six (6) hours as needed for nausea.     ??? syringe with needle (BD LUER-LOK SYRINGE) 3 mL 25 gauge x 1 Syrg Use to inject Epogen under the skin every Tuesday and Friday as directed. 8 each 2   ??? voxelotor (OXBRYTA) 500 mg tablet Take 3 tablets (1,500 mg total) by mouth daily. 90 tablet 0     No current facility-administered medications for this visit.      Allergies:  Patient has no known allergies.    Social History:  Social History     Socioeconomic History   ??? Marital status: Single     Spouse name: None   ??? Number of children: 0   ??? Years of education: None   ??? Highest education level: None   Occupational History   ??? Occupation: disability   Social Needs   ??? Financial resource strain: None   ??? Food insecurity     Worry: None     Inability: None   ??? Transportation needs     Medical: None     Non-medical: None   Tobacco Use   ??? Smoking status: Current Some Day Smoker     Packs/day: 1.00     Years: 0.50     Pack years: 0.50     Types: Cigars   ??? Smokeless tobacco: Never Used   ??? Tobacco comment: Pt smokes 1-2 cigars daily, Pt intends to cease tobacco use    Substance and Sexual Activity   ??? Alcohol use: Not Currently     Alcohol/week: 0.0 standard drinks     Drinks per session: 1 or 2     Binge frequency: Never     Comment: occasional   ??? Drug  use: No   ??? Sexual activity: None   Lifestyle   ??? Physical activity     Days per week: None     Minutes per session: None   ??? Stress: None   Relationships   ??? Social Wellsite geologist on phone: None     Gets together: None     Attends religious service: None     Active member of club or organization: None     Attends meetings of clubs or organizations: None     Relationship status: None   Other Topics Concern   ??? None   Social History Narrative   ??? None     Family History:  family history includes Clotting disorder in his paternal grandmother; Hypertension in his father and mother; No Known Problems in his brother, brother, brother, brother, maternal aunt, maternal grandfather, maternal grandmother, maternal uncle, paternal aunt, paternal grandfather, paternal uncle, sister, sister, and another family member; Sickle cell anemia in his sister.   He indicated that his mother is alive. He indicated that his father is alive. He indicated that two of his three sisters are alive. He indicated that all of his four brothers are alive. He indicated that the status of his maternal grandmother is unknown. He indicated that the status of his maternal grandfather is unknown. He indicated that the status of his paternal grandmother is unknown. He indicated that the status of his paternal grandfather is unknown. He indicated that the status of his maternal aunt is unknown. He indicated that the status of his maternal uncle is unknown. He indicated that the status of his paternal aunt is unknown. He indicated that the status of his paternal uncle is unknown. He indicated that the status of his neg hx is unknown. He indicated that the status of his other is unknown.    Review of Systems:  As per HPI, otherwise negative x 10 systems.    Objective :   Vitals:    03/02/19 0855   BP: 106/57   Pulse: 64   Temp: 36.8 ??C (98.3 ??F)   SpO2: 92%   Weight: 64.3 kg (141 lb 11.2 oz)   Height: 170.2 cm (5' 7)     Physical Exam:    Clubbing, especially index fingers.  Marked icterus  CV RRR inc P2 lungs clear. Lungs clear. No pedal edema. Gait normal. Facial symmetry     Gen - Masked. Appropriate affect. Conversant.  GI - abdomen nondistended, soft, non-tender, liver edge is not appreciable  Skin - no lesions noted on visible skin; scrotum shows a scattered flat flesh colored mac-pap rash, without erythema or drainage.  Neuro - Alert and oriented to conversation, EOM intact, no facial droop, speech fluent and coherent, moves all extremities without asymmetry and with at least antigravity strength  Psych - Normal affect  MSK - no joint swelling or effusions noted  Pain Impact Short form, 25 (all answers were 'never'.    Test Results:    Lab Results   Component Value Date    WBC  03/02/2019      Comment:      Pending.    HGB 5.6 (LL) 03/02/2019    HCT 17.3 (LL) 03/02/2019    PLT 206 03/02/2019       Lab Results   Component Value Date    NA 141 03/02/2019    K 4.7 03/02/2019    CL 109 (H) 03/02/2019    CO2 26.0 03/02/2019  BUN 19 03/02/2019    CREATININE 0.83 03/02/2019    GLU 108 03/02/2019    CALCIUM 8.7 03/02/2019    MG 1.8 12/06/2018    PHOS 4.4 12/06/2018       Lab Results   Component Value Date    BILITOT 4.1 (H) 03/02/2019    BILIDIR 1.90 (H) 03/02/2019    PROT 7.7 03/02/2019    ALBUMIN 3.6 03/02/2019    ALT 23 03/02/2019    AST 119 (H) 03/02/2019    ALKPHOS 288 (H) 03/02/2019    GGT 412 (H) 12/11/2018       Lab Results   Component Value Date    INR 1.21 12/13/2018    APTT 30.3 03/02/2018

## 2019-03-02 NOTE — Unmapped (Signed)
Clinical Pharmacist Practitioner: [facility] Clinic     [name] [name] is a 48 y.o. male with hgb SS disease who I am seeing today for follow-up for start of voxelotor.    Plan:  -Continue voxelotor, and epoetin  -Continue to monitor for signs and symptoms of side effects of voxelotor (headache, diarrhea, nausea), and labs (as in checklist below)  -Continue medication profile review every visit for drug-drug interactions  -next f/u visit wed 7/15 9am    Counseled patient:  -importance of adherence of all medications  -to bring all medications, medication calendar and pillbox at next visit      [name] [name] verbalized understanding of the education provided and had no further questions.     F/u: 7/15  ___________________________________________________________________       Approximate face time spent with patient: 15 minutes     [name], PharmD, BCOP, CPP  Clinical Pharmacist Practitioner, [facility] H[facility]ology    Voxelotor ROS    Adherence:   Patient reported missing 0 doses since started on wed 7/8  Patient has a pillbox yes      Side Effects     Headache: no   -frequency n/a  -required acetaminophen n/a   -required ED visit n/a  -requried hospital admission n/a     Diarrhea: no   -frequency n/a n/a  -required loperamide (imodium) n/a  -required ED visit n/a  -requried hospital admission n/a     Nausea no Emesis no  -frequency n/a   -required medications n/a,  -required ED visit n/a  -requried hospital admission n/a     Others none      TEST INTERVAL Baseline Wk 1       Voxelotor start date: wed 02/28/19             02/21/19     7/10       Insurance approval  Yes, copay $0         Structured ROS (AE: diarrhea, headache, adherence, drug interactions) Every visit Diarrhea  no         Headache  no         Missed doses  no         Drug interactions  yes       Contraception Every visit  yes       Baseline and every 6months  pending       CBCD/ retic  Pre, 2 weeks and monthly Wbc 7.0 5.6         Hgb 5.8 5.6         Plt 212 206         retic 187 134       CMP  Baseline and monthly  Scr 0.63 0.83         Tbili 5.3 4.1         Dbili 1.7 2.4         AST 146 119         ALT 29 23       LDH Baseline and monthly [id number]       BNP Baseline and monthly 870 (4/20)        Promise Fatigue SF (7 items) Baseline and every 2 months  33        ASCQ-Me short form pain impact Baseline and every 2 months          ASCQ-ME Pain Interference SF Baseline and every 2 months  40.7        Number of transfused RBC units OR  Interval of RBCV exchange transfusion Monthly                 none

## 2019-03-02 NOTE — Unmapped (Signed)
SUBJECTIVE:  Chief Complaint: Rash in groin    [name] [name] is a 48 y.o. male presents for evaluation of a rash in his groin since 02/24/19. He states that it is itchy and mildly painful to touch. He admits that after he itches the skin will flake slightly. The rash is limited to the scrotum. Denies having anything like this before. He has not tried anything on this area. Denies additional rashes.    Denies penile discharge, urethral irritation, dysuria, testicular pain/swelling, fever or chills.. Denies concern for STIs.       ROS is negative for 10 systems except as noted in the HPI.       Past Medical History:   Diagnosis Date   ??? Anemia    ??? Foot pain    ??? 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 Hypertension    ??? Pulmonary hypertension (CMS-HCC)    ??? Seizures (CMS-HCC)    ??? Sickle cell anemia (CMS-HCC)    ??? Vitamin D deficiency        No Known Allergies      Current Outpatient Medications:   ???  carBAMazepine (TEGRETOL  XR) 400 MG 12 hr tablet, Take 2 tablets (800 mg total) by mouth Two (2) times a day., Disp: 120 tablet, Rfl: 11  ???  cholecalciferol, vitamin D3, (VITAMIN D3) 10 mcg (400 unit) cap, Take 1 capsule (400 Units total) by mouth daily., Disp: 30 each, Rfl: 0  ???  diclofenac sodium (VOLTAREN) 1 % gel, Apply 2 g topically daily as needed for arthritis., Disp: , Rfl:   ???  empty container (SHARPS CONTAINER) Misc, use as directed, Disp: 1 each, Rfl: 2  ???  epoetin alfa (EPOGEN,PROCRIT) 20,000 unit/mL injection, Inject 1 mL (20,000 Units total) under the skin Every Tuesday and Friday. (Patient taking differently: Inject 20,000 Units under the skin Every Monday and Thursday. ), Disp: 12 mL, Rfl: 2  ???  folic acid (FOLVITE) 1 MG tablet, Take 1 tablet (1 mg total) by mouth daily., Disp: 30 tablet, Rfl: 11  ???  OXYGEN-AIR DELIVERY SYSTEMS MISC, 2 L/min by Each Nare route nightly. [employer], Disp: , Rfl:   ???  phenytoin (DILANTIN) 100 MG ER capsule, Take 2 capsules (200 mg total) by mouth Two (2) times a day., Disp: 120 capsule, Rfl: 11  ???  promethazine (PHENERGAN) 12.5 MG tablet, Take 12.5 mg by mouth every six (6) hours as needed for nausea., Disp: , Rfl:   ???  syringe with needle (BD LUER-LOK SYRINGE) 3 mL 25 gauge x 1 Syrg, Use to inject Epogen under the skin every Tuesday and Friday as directed., Disp: 8 each, Rfl: 2  ???  voxelotor (OXBRYTA) 500 mg tablet, Take 3 tablets (1,500 mg total) by mouth daily., Disp: 90 tablet, Rfl: 0    Family History   Problem Relation Age of Onset   ??? Hypertension Mother    ??? Hypertension Father    ??? No Known Problems Sister    ??? No Known Problems Brother    ??? No Known Problems Brother    ??? No Known Problems Brother    ??? No Known Problems Brother    ??? Sickle cell anemia Sister    ??? No Known Problems Sister    ??? Clotting disorder Paternal Grandmother    ??? No Known Problems Maternal Aunt    ??? No Known Problems Maternal Uncle    ??? No Known Problems Paternal Aunt    ??? No  Known Problems Paternal Uncle    ??? No Known Problems Maternal Grandmother    ??? No Known Problems Maternal Grandfather    ??? No Known Problems Paternal Grandfather    ??? No Known Problems Other    ??? Seizures Neg Hx    ??? Pulmonary Hypertension Neg Hx    ??? Autoimmune disease Neg Hx    ??? Venous thrombosis Neg Hx    ??? Amblyopia Neg Hx    ??? Blindness Neg Hx    ??? Cancer Neg Hx    ??? Cataracts Neg Hx    ??? Diabetes Neg Hx    ??? Glaucoma Neg Hx    ??? Macular degeneration Neg Hx    ??? Retinal detachment Neg Hx    ??? Strabismus Neg Hx    ??? Stroke Neg Hx    ??? Thyroid disease Neg Hx        Social History     Social History Narrative   ??? Not on file       Past Surgical History:   Procedure Laterality Date   ??? PR RIGHT HEART CATH O2 SATURATION & CARDIAC OUTPUT N/A 07/18/2017    Procedure: Right Heart Catheterization;  Surgeon: Zannie Cove, MD;  Location: [facility] CATH;  Service: Cardiology         OBJECTIVE:  BP 99/57  - Pulse 58  - Resp 16  - Ht 172.7 cm (5' 8)  - Wt 65.1 kg (143 lb 9.6 oz)  - SpO2 100%  - BMI 21.83 kg/m?? CONSTITUTIONAL: African-American male appears nontoxic in no acute distress. Alert and oriented x 3 sclera mildly icteric.  CARDIOVASCULAR: Regular, rate, rhythm. Peripheral pulses 2+  RESPIRATORY: Clear to auscultation bilaterally. Normal respiratory effort.  GASTROINTESTINAL: Soft, nondistended, nontender. Bowel sounds present. No guarding, rebound tenderness, or rigidity. No hepatosplenomegaly  No Cva tenderness  GENITOURINARY: Circumcised male.  Testicles are descended without erythema, edema, or tenderness.  Lower scrotum shows tiny papules with dry skin, a few tiny flakes that are peeling.  Rash does not appear erythematous.  Rash does not extend onto the anterior thighs.  PSYCHIATRIC: Speech and behavior appropriate        No results found for this visit on 03/02/19.      ASSESSMENT:  1. Dermatitis          PLAN:  I have prescribed mycolog which is a steroid and antifungal combination. This will hopefully help with the itching and discomfort. Advised patient try zeasorb to help keep the area dry with increased moisture this time of year. If symptoms worsen or fail to improve please follow up with [place] or PCP for reevaluation.        *This clinic note was created using Dragon dictation software.  Therefore, there may be occasional word context mistakes despite proofreading.

## 2019-03-02 NOTE — Unmapped (Signed)
Updated standing orders from Dr. Clarene Duke on 7/1 faxed via Epic to

## 2019-03-02 NOTE — Unmapped (Addendum)
I have prescribed mycolog which is a steroid and antifungal combination. This should help with the itching and discomfort. You may also try zeasorb to help keep the area dry this time of year when its hot out. If symptoms worsen or fail to improve please follow up with [place] or your PCP for reevaluation.   Patient Education        Dermatitis: Care Instructions  Your Care Instructions  Dermatitis is the general name used for any rash or inflammation of the skin. Different kinds of dermatitis cause different kinds of rashes. Common causes of a rash include new medicines, plants (such as poison oak or poison ivy), heat, and stress. Certain illnesses can also cause a rash.  An allergic reaction to something that touches your skin, such as latex, nickel, or poison ivy, is called contact dermatitis. Contact dermatitis may also be caused by something that irritates the skin, such as bleach, a chemical, or soap. These types of rashes cannot be spread from person to person.  How long your rash will last depends on what caused it. Rashes may last a few days or months.  Follow-up care is a key part of your treatment and safety. Be sure to make and go to all appointments, and call your doctor if you are having problems. It's also a good idea to know your test results and keep a list of the medicines you take.  How can you care for yourself at home?  ?? Do not scratch the rash. Cut your nails short, and file them smooth. Or wear gloves if this helps keep you from scratching.  ?? Wash the area with water only. Pat dry.  ?? Put cold, wet cloths on the rash to reduce itching.  ?? Keep cool, and stay out of the sun.  ?? Leave the rash open to the air as much as possible.  ?? If the rash itches, use hydrocortisone cream. Follow the directions on the label. Calamine lotion may help for plant rashes.  ?? Take an over-the-counter antihistamine, such as diphenhydramine (Benadryl) or loratadine (Claritin), to help calm the itching. Read and follow all instructions on the label.  ?? If your doctor prescribed a cream, use it as directed. If your doctor prescribed medicine, take it exactly as directed.  When should you call for help?   Call your doctor now or seek immediate medical care if:  ?? You have symptoms of infection, such as:  ? Increased pain, swelling, warmth, or redness.  ? Red streaks leading from the area.  ? Pus draining from the area.  ? A fever.  ?? You have joint pain along with the rash.  Watch closely for changes in your health, and be sure to contact your doctor if:  ?? Your rash is changing or getting worse.  ?? You are not getting better as expected.  Where can you learn more?  Go to [facility] at [email or web address].org  [facility] [employer]. Enter F270 in the search box to learn more about Dermatitis: Care Instructions.  Current as of: June 22, 2018??????????????????????????????Content Version: 12.5  ?? 2006-2020 [employer].   Care instructions adapted under license by [facility]. If you have questions about a medical condition or this instruction, always ask your healthcare professional. Health[employer], Incorporated disclaims any warranty or liability for your use of this information.

## 2019-03-02 NOTE — Unmapped (Signed)
It was a pleasure to see you today in clinic!    Plan from today's visit  ? Follow up in 1 week  ? Please keep wearing your oxygen at night.  ? Please keep using your Erythropoietin shots, until we ask you to stop.  ? So great that you have started voxelator. We will watch for headaches and diarrhea.  ? Please go to urgent care about your skin rash.    How to get in touch with your Sickle Cell Team:     1. If you are sick, and need an ambulance, CALL 911.    ? Otherwise, call [facility] at [phone], 8AM - 5PM, and ask for the NURSE TRIAGE LINE and leave a message   ? On nights and weekends, for urgent problems you can call the [phone] and ask for the HEMATOLOGIST ON CALL.     2.  For an appointment, please call [facility] at [phone], 8AM - 5PM.    3.  For pain medication refills, leave a message on nurse triage line THREE days before refill needed. All other medication refills ask your pharmacy to fax request.       4.  For general questions, call the sickle cell office, [phone]. You can leave non-urgent messages here as well.    5.  For social work questions or needs; Please call Dickie La RN, MSW, MPH at [phone]

## 2019-03-03 ENCOUNTER — Ambulatory Visit: Admit: 2019-03-03 | Discharge: 2019-03-03 | Payer: MEDICARE

## 2019-03-03 ENCOUNTER — Ambulatory Visit: Admit: 2019-03-03 | Discharge: 2019-03-03 | Payer: MEDICARE | Attending: Hematology | Primary: Hematology

## 2019-03-07 ENCOUNTER — Ambulatory Visit: Admit: 2019-03-07 | Discharge: 2019-03-07 | Disposition: A | Discharge status: Home / Self Care | Payer: MEDICARE

## 2019-03-07 ENCOUNTER — Emergency Department: Admit: 2019-03-07 | Discharge: 2019-03-07 | Disposition: A | Discharge status: Home / Self Care | Payer: MEDICARE

## 2019-03-07 DIAGNOSIS — Z862 Personal history of diseases of the blood and blood-forming organs and certain disorders involving the immune mechanism: Secondary | ICD-10-CM

## 2019-03-07 DIAGNOSIS — R42 Dizziness and giddiness: Principal | ICD-10-CM

## 2019-03-07 DIAGNOSIS — I5089 Other heart failure: Secondary | ICD-10-CM

## 2019-03-07 DIAGNOSIS — D571 Sickle-cell disease without crisis: Principal | ICD-10-CM

## 2019-03-07 LAB — BASIC METABOLIC PANEL
BLOOD UREA NITROGEN: 18 mg/dL (ref 7–21)
BUN / CREAT RATIO: 19
CALCIUM: 8.5 mg/dL (ref 8.5–10.2)
CHLORIDE: 106 mmol/L (ref 98–107)
CO2: 24 mmol/L (ref 22.0–30.0)
CREATININE: 0.93 mg/dL (ref 0.70–1.30)
EGFR CKD-EPI AA MALE: >90 mL/min/{1.73_m2} (ref >=60)
EGFR CKD-EPI NON-AA MALE: >90 mL/min/{1.73_m2} (ref >=60)
GLUCOSE RANDOM: 113 mg/dL (ref 70–179)
POTASSIUM: 5 mmol/L (ref 3.5–5.0)
SODIUM: 139 mmol/L (ref 135–145)

## 2019-03-07 LAB — CBC W/ AUTO DIFF
BASOPHILS ABSOLUTE COUNT: 0 10*9/L (ref 0.0–0.1)
EOSINOPHILS ABSOLUTE COUNT: 0.1 10*9/L (ref 0.0–0.4)
EOSINOPHILS RELATIVE PERCENT: 1.4 %
HEMATOCRIT: 17.6 % — CL (ref 41.0–53.0)
HEMOGLOBIN: 5.6 g/dL — CL (ref 13.5–17.5)
LARGE UNSTAINED CELLS: 4 % (ref 0–4)
LYMPHOCYTES RELATIVE PERCENT: 41.9 %
MEAN CORPUSCULAR HEMOGLOBIN: 35.7 pg — ABNORMAL HIGH (ref 26.0–34.0)
MEAN CORPUSCULAR VOLUME: 111.5 fL — ABNORMAL HIGH (ref 80.0–100.0)
MEAN PLATELET VOLUME: 8.4 fL (ref 7.0–10.0)
MONOCYTES ABSOLUTE COUNT: 1.2 10*9/L — ABNORMAL HIGH (ref 0.2–0.8)
MONOCYTES RELATIVE PERCENT: 18.7 %
NEUTROPHILS ABSOLUTE COUNT: 2.1 10*9/L (ref 2.0–7.5)
NEUTROPHILS RELATIVE PERCENT: 33.5 %
NUCLEATED RED BLOOD CELLS: 31 /100{WBCs} — ABNORMAL HIGH (ref <=4)
PLATELET COUNT: 167 10*9/L (ref 150–440)
RED BLOOD CELL COUNT: 1.58 10*12/L — ABNORMAL LOW (ref 4.50–5.90)
RED CELL DISTRIBUTION WIDTH: 21.1 % — ABNORMAL HIGH (ref 12.0–15.0)
WBC ADJUSTED: 4.9 10*9/L (ref 4.5–11.0)

## 2019-03-07 LAB — CARBAMAZEPINE LEVEL: Carbamazepine:MCnc:Pt:Ser/Plas:Qn:: 6

## 2019-03-07 LAB — HEPATIC FUNCTION PANEL
ALBUMIN: 3.7 g/dL (ref 3.5–5.0)
ALKALINE PHOSPHATASE: 219 U/L — ABNORMAL HIGH (ref 38–126)
AST (SGOT): 124 U/L — ABNORMAL HIGH (ref 19–55)
BILIRUBIN DIRECT: 2.4 mg/dL — ABNORMAL HIGH (ref 0.00–0.40)
BILIRUBIN TOTAL: 4.5 mg/dL — ABNORMAL HIGH (ref 0.0–1.2)
PROTEIN TOTAL: 8.4 g/dL — ABNORMAL HIGH (ref 6.5–8.3)

## 2019-03-07 LAB — BILIRUBIN DIRECT: Bilirubin.glucuronidated:MCnc:Pt:Ser/Plas:Qn:: 2.4 — ABNORMAL HIGH

## 2019-03-07 LAB — RETICULOCYTE ABSOLUTE COUNT, MANUAL: Lab: 173.8 — ABNORMAL HIGH

## 2019-03-07 LAB — LACTATE DEHYDROGENASE: Lactate dehydrogenase:CCnc:Pt:Ser/Plas:Qn:: 4183 — ABNORMAL HIGH

## 2019-03-07 LAB — POLYCHROMASIA

## 2019-03-07 LAB — PHENYTOIN LEVEL: Phenytoin:MCnc:Pt:Ser/Plas:Qn:: 10.1

## 2019-03-07 LAB — SLIDE REVIEW

## 2019-03-07 LAB — BUN / CREAT RATIO: Urea nitrogen/Creatinine:MRto:Pt:Ser/Plas:Qn:: 19

## 2019-03-07 LAB — ALBUMIN: Albumin:MCnc:Pt:Ser/Plas:Qn:: 3.7

## 2019-03-07 LAB — LARGE UNSTAINED CELLS: Lab: 4

## 2019-03-07 LAB — PRO-BNP: Natriuretic peptide.B prohormone N-Terminal:MCnc:Pt:Ser/Plas:Qn:: 790 — ABNORMAL HIGH

## 2019-03-07 NOTE — Unmapped (Signed)
Pt in clinic for pharmacy consult. Pt brought own Epogen 20,000 units injection. Pt gave own injection. Pt correctly self injects medication. Upon leaving clinic, pt felt lightheaded. Vital signs stable. Taken to urgent care and Dr Clarene Duke notified.

## 2019-03-07 NOTE — Unmapped (Signed)
Clinical Pharmacist Practitioner: Sickle Clinic     [name] [name] is a 48 y.o. male with hgb SS disease who I am seeing today for follow-up of voxelotor.    Plan:  -Continue with current dose of voxelotor  -Labs locally next Monday 7/20  -RTC 7/30    Counseled patient on:  -taking promethazine prn nausea  -calling for any new symptoms    [name] [name] verbalized understanding of the education provided and had no further questions.       F/u: 7/30 with Dr Clarene Duke    Approximate face time spent with patient: 50 minutes     [name], PharmD, BCOP, CPP  Clinical Pharmacist Practitioner, Benign Hematology    ___________________________________________________________________     Voxelotor Review  Interim history:  Patient reported following:  -upset stomach on the whole day on Sunday 7/12, was not able to keep food down, but recovered on Monday  -no allergy symptoms  -no other new symptoms     Patient received epoetin 20,000 units in the clinic. Was dizzy when walking out of the clinic. Repeat vitals the same as at the start of visit. Patient was brought back into the clinic and was provided with some snacks and drink. Patient reported no dizziness, and was able to leave the clinic.    Addendum: patient was still in his truck ~15 minutes after he left the clinic, feeling dizzy when Cheila and I checked on the patient. We suggested that he go to the urgent care.    Medication access: [facility], copay $0    Adherence:   Patient reported missing 0 doses in the past week  Patient has a pillbox yes    Drug-drug interaction  New medications: no   Medications discontinued: no  Potential drug, herb or dietary supplement interactions no    Side Effects     Headache: no     Diarrhea: no     Nausea yes Emesis yes  -frequency once on Sunday 7/12, none after sunday    -required medications no, What medication? Patient has promethazine 12.5mg  tablets at home  -required ED visit no  -requried hospital admission no     Others none Pain  Last sickle crisis: may 2020  number of sickle crisis since last visit: 0  number of ED visits since last visit 0  number of hospital admissions since last visit 0            TEST INTERVAL Baseline Wk 1 ??Wk 2   Voxelotor start date: wed 02/28/19             02/21/19     7/10 ??    7/15   Insurance approval ?? Yes, copay $0 ?? ?? ??   Structured ROS (AE: diarrhea, headache, adherence, drug interactions) Every visit Diarrhea ?? no ??no     Nausea  no yes     Headache ?? no ??no     Missed doses ?? no ??no     Drug interactions ?? yes ??no   Contraception Every visit ?? yes ??yes   Baseline and every 6months ?? pending ??7/30   CBCD/ retic  Pre, 2 weeks and monthly Wbc 7.0 5.6 ??4.9     Hgb 5.8 5.6 ??5.6     Plt 212 206 ??167     retic 187 134 ??173   CMP  Baseline and monthly  Scr 0.63 0.83 0.93     Tbili 5.3 4.1 ??4.5     Dbili 1.7 1.9  2.4     AST 146 119 ??124     ALT 29 23 ??24   LDH Baseline and monthly 4516 4422 ??4183   BNP Baseline and monthly 870 (4/20) ?? 790??   Promise Fatigue SF (7 items) Baseline and every 2 months  33 ?? ??   ASCQ-Me Pain impact SF Baseline and every 2 months  ??  25   ASCQ-ME Pain Interference SF Baseline and every 2 months                40.7 ?? ??   Number of transfused RBC units OR  Interval of RBCV exchange transfusion Monthly                 none ??none none

## 2019-03-07 NOTE — Unmapped (Signed)
BIB [facility] EMS due to syncope, hit head, no LOC. Denies covid symptoms. GCS 15

## 2019-03-07 NOTE — Unmapped (Signed)
Pt ambulated in hallway  to restroom with nurse at 12:35. While in the bathroom by himself pt fell into the back of the bathroom door and fell to the ground. Pt found laying on right side. No LOC noted. No lacerations noted to head. Pt reports he hit the back of his head. Level of dizziness remains the same. Pt assisted to wheelchair and vital signs obtained 130/76RUE, 59HR, 14RR, 97%. EMS called. EMS arrival to pt at 12:50 and report given. Pt will be taken to [facility] ED [facility].

## 2019-03-07 NOTE — Unmapped (Signed)
ED Procedure Note    EKG Interpretation    Date/Time: 03/07/2019 2:28 PM  Performed by: Garth Schlatter, MD  Authorized by: Allie Bossier, MD     ECG reviewed by ED Physician in the absence of a cardiologist: yes    Previous ECG:     Previous ECG:  Compared to current    Similarity:  Changes noted  Interpretation:     Interpretation: normal    Rate:     ECG rate assessment: bradycardic    Rhythm:     Rhythm: sinus rhythm    Ectopy:     Ectopy: none    QRS:     QRS axis:  Normal    QRS intervals:  Normal  Conduction:     Conduction: normal    ST segments:     ST segments:  Normal  T waves:     T waves: normal

## 2019-03-07 NOTE — Unmapped (Signed)
Bed: 13-A  Expected date:   Expected time:   Means of arrival:   Comments:  ems

## 2019-03-07 NOTE — Unmapped (Addendum)
Pt transferred to [facility] ED by EMS  Patient Education        Dizziness: Care Instructions  Your Care Instructions  Dizziness is the feeling of unsteadiness or fuzziness in your head. It is different than having vertigo, which is a feeling that the room is spinning or that you are moving or falling. It is also different from lightheadedness, which is the feeling that you are about to faint.  It can be hard to know what causes dizziness. Some people feel dizzy when they have migraine headaches. Sometimes bouts of flu can make you feel dizzy. Some medical conditions, such as heart problems or high blood pressure, can make you feel dizzy. Many medicines can cause dizziness, including medicines for high blood pressure, pain, or anxiety.  If a medicine causes your symptoms, your doctor may recommend that you stop or change the medicine. If it is a problem with your heart, you may need medicine to help your heart work better. If there is no clear reason for your symptoms, your doctor may suggest watching and waiting for a while to see if the dizziness goes away on its own.  Follow-up care is a key part of your treatment and safety. Be sure to make and go to all appointments, and call your doctor if you are having problems. It's also a good idea to know your test results and keep a list of the medicines you take.  How can you care for yourself at home?  ?? If your doctor recommends or prescribes medicine, take it exactly as directed. Call your doctor if you think you are having a problem with your medicine.  ?? Do not drive while you feel dizzy.  ?? Try to prevent falls. Steps you can take include:  ? Using nonskid mats, adding grab bars near the tub, and using night-lights.  ? Clearing your home so that walkways are free of anything you might trip on.  ? Letting family and friends know that you have been feeling dizzy. This will help them know how to help you.  When should you call for help?   [id number] anytime you think you may need emergency care. For example, call if:  ?? You passed out (lost consciousness).  ?? You have dizziness along with symptoms of a heart attack. These may include:  ? Chest pain or pressure, or a strange feeling in the chest.  ? Sweating.  ? Shortness of breath.  ? Nausea or vomiting.  ? Pain, pressure, or a strange feeling in the back, neck, jaw, or upper belly or in one or both shoulders or arms.  ? Lightheadedness or sudden weakness.  ? A fast or irregular heartbeat.  ?? You have symptoms of a stroke. These may include:  ? Sudden numbness, tingling, weakness, or loss of movement in your face, arm, or leg, especially on only one side of your body.  ? Sudden vision changes.  ? Sudden trouble speaking.  ? Sudden confusion or trouble understanding simple statements.  ? Sudden problems with walking or balance.  ? A sudden, severe headache that is different from past headaches.  Call your doctor now or seek immediate medical care if:  ?? You feel dizzy and have a fever, headache, or ringing in your ears.  ?? You have new or increased nausea and vomiting.  ?? Your dizziness does not go away or comes back.  Watch closely for changes in your health, and be sure to contact your doctor if:  ??  You do not get better as expected.  Where can you learn more?  Go to [facility] at [email or web address]  [facility] [employer] [facility] [employer]. Enter [phone] in the search box to learn more about Dizziness: Care Instructions.  Current as of: February 15, 2018??????????????????????????????Content Version: 12.5  ?? 2006-2020 [employer].   Care instructions adapted under license by [facility]. If you have questions about a medical condition or this instruction, always ask your healthcare professional. [employer] disclaims any warranty or liability for your use of this information.

## 2019-03-07 NOTE — Unmapped (Signed)
[facility]  Emergency Department Provider Note    ED Clinical Impression     Final diagnoses:   Syncope and collapse (Primary)   Dizziness   History of complex partial epilepsy     Initial Impression, ED Course, Assessment and Plan     Impression: Syncope and collapse, dizziness, history of partial complex seizure    This is a 48 year old male with PMH of sickle cell anemia, seizure disorder on carbamazepine/phenytoin, pulmonary hypertension and nocturnal hypoxemia who presents with an episode of syncope at a regular clinic visit.  He appears somewhat postictal on examination.  Question possible breakthrough complex partial seizure.  Denies recent exacerbating occurrences such as drug use, infectious symptoms.  Endorses compliance with antiepileptics.  Additionally considered posterior stroke given reported ongoing dizziness but no physical exam correlate for this and HINTS exam normal.  Of note, he reported similar dizziness during hospitalization in April of this year.  MRI at that time was normal.  Defer head imaging for now.  Additionally considered ACS, PE but less likely given symptomatology.  Will evaluate for orthostatic hypotension.  -EKG  -Check phenytoin and carbamazepine levels  -Orthostatic vitals    Additional Medical Decision Making     ED Course as of Mar 07 1647   Wed Mar 07, 2019   1545 AED levels came back within the normal range.  Patient reevaluated still drowsy but he states this is due to lack of sleep.  Still complains of dizziness.  While attempts to ambulate him and if he does okay likely anticipate discharge home.  Patient was agreeable to this plan.      1642 Orthostatic vitals negative.  Patient was able to ambulate around the unit with some slight unsteadiness on his feet.  No seizure activity (generalized or non-generalized) has been observed while he has been in the emergency department.  We discussed that we felt it was safe for him to discharge home.  He was in agreement with this plan.  He requests a taxi voucher to get home.        I independently visualized the EKG tracing.   I independently visualized the radiology images.   I reviewed the patient's prior medical records.   I discussed the case with the consultant, admitting provider, radiologist, ED pharmacist etc.   Additional history obtained from chart review as detailed in my note.     Any labs and radiology results that were available during my care of the patient were independently reviewed by me and considered in my medical decision making.    Portions of this record have been created using Scientist, clinical (histocompatibility and immunogenetics). Dictation errors have been sought, but may not have been identified and corrected.  ____________________________________________    I have reviewed the triage vital signs and the nursing notes.     History     Chief Complaint  Syncope    HPI   [name] [name] is a 48 y.o. male with PMH of sickle cell anemia, seizure disorder on carbamazepine/phenytoin, pulmonary hypertension and nocturnal hypoxemia who presents with an episode of syncope at a regular clinic visit.    Patient was in his normal state of health last night and this morning.  He went to his regularly scheduled hematology appointment.  While in the bathroom to provide a urine sample, he had an episode of syncope falling backwards and striking his posterior head.  Reportedly, there was no LOC or convulsions documented.  He notes feeling lightheaded just prior  to syncopized.  Denies chest pain, shortness of breath, palpitations, nausea/vomiting, diaphoresis.  Endorses ongoing dizziness and feeling off balance.  No focal weakness or numbness.  No headache.  No fever, chills, sweats.  He says that when he is sleep deprived (when he does not get at least 12 hours of sleep) he feels dizzy and off-balance.    He endorses compliance with his to home antiepileptic medications and was able to name them.  Denies illicit drug use.  Denies infectious symptoms.  No cough, abdominal pain, diarrhea, skin wounds or lesions, no dysuria.    Denies a history of blood clot or pulmonary embolism.    He states his last breakthrough seizure was maybe about 2 months ago.  He is unsure if he has tonic-clonic activity when he has a seizure.    Pertinent travel history?  None    Past Medical History:   Diagnosis Date   ??? Anemia    ??? Foot pain    ??? 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 Hypertension    ??? Pulmonary hypertension (CMS-HCC)    ??? Seizures (CMS-HCC)    ??? Sickle cell anemia (CMS-HCC)    ??? Vitamin D deficiency      Patient Active Problem List   Diagnosis   ??? Hyperpotassemia   ??? 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 Sickle cell anemia (CMS-HCC)   ??? Hyperkalemia   ??? Proteinuria   ??? Vitamin D deficiency   ??? Seizure disorder (CMS-HCC)   ??? Pain of left hip joint   ??? Ear infection   ??? Sickle cell crisis (CMS-HCC)   ??? Pulmonary HTN (CMS-HCC)   ??? Anemia   ??? Edema   ??? Hypoxemia   ??? Tobacco use disorder   ??? High output heart failure (CMS-HCC)   ??? Vertigo   ??? Hyperbilirubinemia   ??? Nausea & vomiting     Past Surgical History:   Procedure Laterality Date   ??? PR RIGHT HEART CATH O2 SATURATION & CARDIAC OUTPUT N/A 07/18/2017    Procedure: Right Heart Catheterization;  Surgeon: Zannie Cove, MD;  Location: [facility] CATH;  Service: Cardiology     No current facility-administered medications for this encounter.     Current Outpatient Medications:   ???  carBAMazepine (TEGRETOL  XR) 400 MG 12 hr tablet, Take 2 tablets (800 mg total) by mouth Two (2) times a day., Disp: 120 tablet, Rfl: 11  ???  cholecalciferol, vitamin D3, (VITAMIN D3) 10 mcg (400 unit) cap, Take 1 capsule (400 Units total) by mouth daily., Disp: 30 each, Rfl: 0  ???  diclofenac sodium (VOLTAREN) 1 % gel, Apply 2 g topically daily as needed for arthritis., Disp: , Rfl:   ???  empty container (SHARPS CONTAINER) Misc, use as directed, Disp: 1 each, Rfl: 2  ???  epoetin alfa (EPOGEN,PROCRIT) 20,000 unit/mL injection, Inject 1 mL (20,000 Units total) under the skin Every Tuesday and Friday. (Patient taking differently: Inject 20,000 Units under the skin Every Monday and Thursday. ), Disp: 12 mL, Rfl: 2  ???  folic acid (FOLVITE) 1 MG tablet, Take 1 tablet (1 mg total) by mouth daily., Disp: 30 tablet, Rfl: 11  ???  nystatin-triamcinolone (MYCOLOG) 100,000-0.1 unit/gram-% ointment, Apply topically Two (2) times a day., Disp: 30 g, Rfl: 0  ???  OXYGEN-AIR DELIVERY SYSTEMS MISC, 2 L/min by Each Nare route nightly. [employer], Disp: , Rfl:   ???  phenytoin (DILANTIN) 100 MG ER capsule,  Take 2 capsules (200 mg total) by mouth Two (2) times a day., Disp: 120 capsule, Rfl: 11  ???  promethazine (PHENERGAN) 12.5 MG tablet, Take 12.5 mg by mouth every six (6) hours as needed for nausea., Disp: , Rfl:   ???  sodium polystyrene (KAYEXALATE) powder, Take 15 g by mouth continuous as needed (with high potassium food (e.g. banana))., Disp: , Rfl:   ???  voxelotor (OXBRYTA) 500 mg tablet, Take 3 tablets (1,500 mg total) by mouth daily., Disp: 90 tablet, Rfl: 0    Allergies  Patient has no known allergies.    Family History   Problem Relation Age of Onset   ??? Hypertension Mother    ??? Hypertension Father    ??? No Known Problems Sister    ??? No Known Problems Brother    ??? No Known Problems Brother    ??? No Known Problems Brother    ??? No Known Problems Brother    ??? Sickle cell anemia Sister    ??? No Known Problems Sister    ??? Clotting disorder Paternal Grandmother    ??? No Known Problems Maternal Aunt    ??? No Known Problems Maternal Uncle    ??? No Known Problems Paternal Aunt    ??? No Known Problems Paternal Uncle    ??? No Known Problems Maternal Grandmother    ??? No Known Problems Maternal Grandfather    ??? No Known Problems Paternal Grandfather    ??? No Known Problems Other    ??? Seizures Neg Hx    ??? Pulmonary Hypertension Neg Hx    ??? Autoimmune disease Neg Hx    ??? Venous thrombosis Neg Hx    ??? Amblyopia Neg Hx    ??? Blindness Neg Hx    ??? Cancer Neg Hx    ??? Cataracts Neg Hx    ??? Diabetes Neg Hx    ??? Glaucoma Neg Hx    ??? Macular degeneration Neg Hx    ??? Retinal detachment Neg Hx    ??? Strabismus Neg Hx    ??? Stroke Neg Hx    ??? Thyroid disease Neg Hx      Social History  Social History     Tobacco Use   ??? Smoking status: Current Some Day Smoker     Packs/day: 1.00     Years: 0.50     Pack years: 0.50     Types: Cigars   ??? Smokeless tobacco: Never Used   ??? Tobacco comment: Pt smokes 1-2 cigars daily, Pt intends to cease tobacco use    Substance Use Topics   ??? Alcohol use: Not Currently     Alcohol/week: 0.0 standard drinks     Drinks per session: 1 or 2     Binge frequency: Never     Comment: occasional   ??? Drug use: No     Review of Systems  Constitutional: Negative for fever, chills, sweats.  Eyes: Negative for visual changes.  No scleral icterus.  ENT: Negative for sore throat.  No cough.  Cardiovascular: Negative for chest pain.  No palpitations.  Respiratory: Negative for shortness of breath.  No wheezing.  Gastrointestinal: Negative for abdominal pain, vomiting or diarrhea.  Genitourinary: Negative for dysuria.  No change in urine character.  Musculoskeletal: Negative for back pain.  No joint pain.  Skin: Negative for rash.  No lesions.  Neurological: Negative for headaches, focal weakness or numbness.  Positive for dizziness.    Physical Exam  ED Triage Vitals [03/07/19 1321]   Enc Vitals Group      BP 126/71      Heart Rate 59      SpO2 Pulse 57      Resp 22      Temp 36.4 ??C (97.6 ??F)      Temp Source Oral      SpO2 100 %      Weight       Height       Head Circumference       Peak Flow       Pain Score       Pain Loc       Pain Edu?       Excl. in GC?      Constitutional: Alert, oriented.  Fluent speech.  Patient appears drowsy/groggy.  Eyes: Conjunctivae are normal.  ENT       Head: Normocephalic and atraumatic.       Nose: No congestion.       Mouth/Throat: Mucous membranes are moist.       Neck: No stridor.  Hematological/Lymphatic/Immunilogical: No cervical lymphadenopathy.  Cardiovascular: Normal rate, regular rhythm. Normal and symmetric distal pulses are present in all extremities.  Respiratory: Normal respiratory effort. Breath sounds are normal.  Gastrointestinal: Soft and nontender. There is no CVA tenderness.  Musculoskeletal: Normal range of motion in all extremities.       Right lower leg: No tenderness or edema.       Left lower leg: No tenderness or edema.  Neurologic: Alert and oriented x3.  Drowsy.  Cranial nerves II through XII intact through direct confrontation.  Upper and lower extremity strength bilaterally 5/5.  Sensation intact grossly.  2+ patellar and Achilles reflexes bilaterally.  Finger-to-nose normal bilaterally without dysdiadochokinesia or intention tremor.  Hints exam normal with no spontaneous nystagmus, bilateral corrective saccade with head impulse, normal test of skew.  Skin: Skin is warm, dry and intact. No rash noted.  Psychiatric: Mood and affect are normal. Speech and behavior are normal.    EKG     Sinus bradycardia.    Procedures     Procedure(s) performed: EKG, see procedure note(s).             Garth Schlatter, MD  Resident  03/07/19 [phone]

## 2019-03-07 NOTE — Unmapped (Signed)
SUBJECTIVE:  Chief Complaint: dizziness    [name] [name] is a 48 y.o. male with PMHX of sickle cell anemia, high output heart failure, seizure disorder, tobacco use disorder, hyperbilirubinemia,  presents for evaluation of dizziness. Pt states that he was at a Hematology appt this morning and given 20,000 units of epoetin. He began feeling dizzy when he stood up to leave. He describes as feeling unsteady on his feet. Denies syncope, near syncope, or feeling of room spinning. He was given juice and crackers.  After leaving the clinic pt sat in his car for - 1hour but continued to feel dizzy. He has also had one bottle of water and a bowl of cereal before leaving his house. Pt was brought to urgent care for assessment by pharmacist up stairs. He admits to being started on Voxelator last week. He denies any other medication changes.     He admits to feeling dizzy like this in the past if he either missed his medication or has not slept well.  Denies missing any medication but only slept 2-3 hours last night.  He admits that he has had issues with sleeping for the last year.  He has been taking NyQuil a few times a week for the last 3 months to help him to get to sleep.  He is not aware why he was not able to sleep last night.  Some nights he can sleep up to 12 hours.      Sunday he felt nauseous and had 3 episodes of vomiting.  He is not aware why but this has improved on its own.  He had a normal BM this morning.  Denies abdominal pain or urinary symptoms.     He states that he felt fine yesterday.  Denies headache, visual changes, neck stiffness, fever, peripheral edema, chest pain, shortness of breath, or palpitations.     He had outpatient labs prior to appointment upstairs.    Patient drove here from [place] and lives alone.    ROS is negative for 10 systems except as noted in the HPI.       Past Medical History:   Diagnosis Date   ??? Anemia    ??? Foot pain    ??? 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 Hypertension    ??? Pulmonary hypertension (CMS-HCC)    ??? Seizures (CMS-HCC)    ??? Sickle cell anemia (CMS-HCC)    ??? Vitamin D deficiency        No Known Allergies      Current Outpatient Medications:   ???  carBAMazepine (TEGRETOL  XR) 400 MG 12 hr tablet, Take 2 tablets (800 mg total) by mouth Two (2) times a day., Disp: 120 tablet, Rfl: 11  ???  cholecalciferol, vitamin D3, (VITAMIN D3) 10 mcg (400 unit) cap, Take 1 capsule (400 Units total) by mouth daily., Disp: 30 each, Rfl: 0  ???  diclofenac sodium (VOLTAREN) 1 % gel, Apply 2 g topically daily as needed for arthritis., Disp: , Rfl:   ???  empty container (SHARPS CONTAINER) Misc, use as directed, Disp: 1 each, Rfl: 2  ???  epoetin alfa (EPOGEN,PROCRIT) 20,000 unit/mL injection, Inject 1 mL (20,000 Units total) under the skin Every Tuesday and Friday. (Patient taking differently: Inject 20,000 Units under the skin Every Monday and Thursday. ), Disp: 12 mL, Rfl: 2  ???  folic acid (FOLVITE) 1 MG tablet, Take 1 tablet (1 mg total) by mouth daily., Disp: 30 tablet, Rfl:  11  ???  nystatin-triamcinolone (MYCOLOG) 100,000-0.1 unit/gram-% ointment, Apply topically Two (2) times a day., Disp: 30 g, Rfl: 0  ???  OXYGEN-AIR DELIVERY SYSTEMS MISC, 2 L/min by Each Nare route nightly. [employer], Disp: , Rfl:   ???  phenytoin (DILANTIN) 100 MG ER capsule, Take 2 capsules (200 mg total) by mouth Two (2) times a day., Disp: 120 capsule, Rfl: 11  ???  promethazine (PHENERGAN) 12.5 MG tablet, Take 12.5 mg by mouth every six (6) hours as needed for nausea., Disp: , Rfl:   ???  sodium polystyrene (KAYEXALATE) powder, Take 15 g by mouth continuous as needed (with high potassium food (e.g. banana))., Disp: , Rfl:   ???  voxelotor (OXBRYTA) 500 mg tablet, Take 3 tablets (1,500 mg total) by mouth daily., Disp: 90 tablet, Rfl: 0    Family History   Problem Relation Age of Onset   ??? Hypertension Mother    ??? Hypertension Father    ??? No Known Problems Sister    ??? No Known Problems Brother    ??? No Known Problems Brother    ??? No Known Problems Brother    ??? No Known Problems Brother    ??? Sickle cell anemia Sister    ??? No Known Problems Sister    ??? Clotting disorder Paternal Grandmother    ??? No Known Problems Maternal Aunt    ??? No Known Problems Maternal Uncle    ??? No Known Problems Paternal Aunt    ??? No Known Problems Paternal Uncle    ??? No Known Problems Maternal Grandmother    ??? No Known Problems Maternal Grandfather    ??? No Known Problems Paternal Grandfather    ??? No Known Problems Other    ??? Seizures Neg Hx    ??? Pulmonary Hypertension Neg Hx    ??? Autoimmune disease Neg Hx    ??? Venous thrombosis Neg Hx    ??? Amblyopia Neg Hx    ??? Blindness Neg Hx    ??? Cancer Neg Hx    ??? Cataracts Neg Hx    ??? Diabetes Neg Hx    ??? Glaucoma Neg Hx    ??? Macular degeneration Neg Hx    ??? Retinal detachment Neg Hx    ??? Strabismus Neg Hx    ??? Stroke Neg Hx    ??? Thyroid disease Neg Hx        Social History     Social History Narrative   ??? Not on file       Past Surgical History:   Procedure Laterality Date   ??? PR RIGHT HEART CATH O2 SATURATION & CARDIAC OUTPUT N/A 07/18/2017    Procedure: Right Heart Catheterization;  Surgeon: Zannie Cove, MD;  Location: [facility] CATH;  Service: Cardiology         OBJECTIVE:  BP 101/62 (BP Site: R Arm, BP Position: Sitting)  - Pulse 60  - Temp 36.2 ??C (97.2 ??F) (Temporal)  - Resp 16  - SpO2 94%   CONSTITUTIONAL: African American male appears tired, sitting in a wheel chair. He appears nontoxic, in no acute distress. Alert and oriented x 3  EYES: Scelera icteric. PERRLA. Extraocular movement intact.  ENT-   EARS: Canals clear. TMs without erythema, landmarks well visualized.   NOSE: No nasal discharge.  THROAT: Pharynx without erythema or exudates. Uvula midline.   LYMPHATIC/ ENDOCRINE: No thyromegaly.No cervical lymphadenopathy  CARDIOVASCULAR: Regular, rate, rhythm. No murmurs. No JVD. No peripheral edema.  Peripheral  pulses 2+  RESPIRATORY: Clear to auscultation bilaterally. Normal respiratory effort.  GASTROINTESTINAL: Soft, nondistended, nontender. Bowel sounds present. No guarding, rebound tenderness, or rigidity. No hepatosplenomegaly  MUSKULOSKELETAL:  Normal range of motion all extremities. Strength 5/5 all extremities.  NEUROLOGIC: CN II-XII grossly intact. DTRs 2+ equal symmetric.  Normal sensation to light touch symmetric.  No focal neuro deficits.   SKIN: well perfused without rashes.   PSYCHIATRIC: Speech and behavior appropriate   ??  Orthostatic vitals-  Lying 109/66 pulse 61  Sitting 113/65 pulse 60  Standing 130/76 pulse 59    Encounter Date: 03/07/19   ECG 12 Lead    Narrative    Normal sinus rhythm         Results for orders placed or performed in visit on 03/07/19   POCT Urinalysis Dip Automated   Result Value Ref Range    Color, UA Amber     Clarity, UA Clear     Glucose, UA Negative Negative    Bilirubin, UA Small Negative    Ketones, POC Negative Negative    Spec Grav, UA 1.015 1.005 - 1.030    Blood, UA Negative Negative    pH, UA 7.0 5.0 - 9.0    Protein, UA Negative Negative    Urobilinogen, UA 2.0 mg/dL Negative (0.2 mg/dL)    Nitrite, UA Negative Negative    Leukocytes, UA Negative Negative    UA Strip Lot Number [id number]     UA Strip Lot Expiration 02/20/2020    POCT glucose   Result Value Ref Range    Glucose, POC 96 65 - 179 mg/dL    Glucose Strip Lot Num [id number]     Glucose Strip Exp 08/28/2019          ASSESSMENT:  1. Dizziness    2. Hx of sickle cell anemia          PLAN:  I have reviewed the patient's BMP, CBC, hepatic function, LDH, reticulocyte count, and BNP that were drawn earlier today.     Orthostatics, EKG, POCT urinalysis, and Poct glucose ordered while waiting to hear back from patient's hematologist Dr. Clarene Duke. While taking to her own the phone voicing her concern that this could be due to medication and may need addition EPO. We both agreed that we did not feel comfortable allowing the patient to drive or go home alone. As I was going to tell the patient to send him to the ED for further evaluation he fell in the back of the bathroom door while giving his urine specimen. Denies LOC but this was not witnessed. Admits to feeling dizzy when he stood up from giving urine sample. Denies increased dizziness. No laceration or hematoma. Vital signs reassessed while EMS called to transport the patient over to the ED for further evaluation.        *This clinic note was created using Dragon dictation software.  Therefore, there may be occasional word context mistakes despite proofreading.

## 2019-03-07 NOTE — Unmapped (Incomplete)
It was great to see you today.    1. Please continue taking your medications as before  2. Labs locally next Monday July 20th  3. Return to clinic on July 30th afternoon     Please call if you have any new symptoms

## 2019-03-08 MED ORDER — EPOETIN ALFA 40,000 UNIT/ML INJECTION SOLUTION
SUBCUTANEOUS | 3 refills | 28 days | Status: CP
Start: 2019-03-08 — End: 2020-03-07
  Filled 2019-03-12: qty 8, 28d supply, fill #0

## 2019-03-08 MED ORDER — VOXELOTOR (OXBRYTA) 500 MG TABLET
ORAL_TABLET | Freq: Every day | ORAL | 2 refills | 30 days | Status: CP
Start: 2019-03-08 — End: ?
  Filled 2019-03-12: qty 90, 30d supply, fill #0

## 2019-03-08 NOTE — Unmapped (Signed)
.  slc    Clinical Pharmacist Practitioner: [facility]     Mr.Hennen is a 48 y.o. male with hgb SS disease who I am calling today for follow-up of epoetin, and checking in post syncopal event yesterday.    Current epoetin dose: 20,000 units two times weekly    Plan:  -Adjust epoetin dose to 40,000 units two times weekly, starting from Monday 03/12/19  -Epoetin 20,000 units today Thursday 7/16  -blood draw weekly, starting monday 03/12/19 (have faxed orders to [facility] to see if home blood draw are possible)  ---CBC/D, retic every Monday  ---CMP, direct bilirubin, LDH every month    Counseled patient to :  -give epoetin 20,000 units injection today (patient received 20,000 units in clinic yesterday)  -use epoetin 40,000 units vials on Monday, when received.    Counseled patient's mother on voxelotor and epoetin (indication, dose and administration, frequently reported side effects)    Prescriptions:  -procrit 40,000 units/ml #43mls, 3 refills, syringes with needle 1ml 25G 5/8 #8, 2 refills    Patient verbalized understanding of plan with teach back, and patient's mother verbalized understanding of counseling of voxelotor and epoetin, and had no further questions.      F/u: 03/22/19 with Dr Clarene Duke  ___________________________________________________________________    Interim history:  Patient reported following:   -recovered from yesterday's syncopal event, no longer dizzy    Epoetin dosing history:  Epoetin started on 02/07/19 during hospital admission 4/15-4/22/20  Epoetin 20,000 units two times weekly (4/22-7/13/20)  Epoetin 40,000 units two times weekly (7/15 - present)    Pharmacy: [facility]  Medication Access: $3.90    Labs:      Approximate telephone fime spent: 8 minutes with patient, and 8 minutes with patient's mother     [name], PharmD, BCOP, CPP  Clinical Pharmacist Practitioner, Benign Hematology

## 2019-03-08 NOTE — Unmapped (Addendum)
Voxelotor 500mg  spilled while at providers office.  Needs early fill.  Currently has 8 days worth of medication on hand    [facility] Clinical Assessment & Refill Coordination Note    [name] [name], DOB: 1970-09-28  Phone: [phone] (home)     All above HIPAA information was verified with patient.     Specialty Medication(s):   Hematology/Oncology: Procrit and Gardenia Phlegm     Current Outpatient Medications   Medication Sig Dispense Refill   ??? carBAMazepine (TEGRETOL  XR) 400 MG 12 hr tablet Take 2 tablets (800 mg total) by mouth Two (2) times a day. 120 tablet 11   ??? cholecalciferol, vitamin D3, (VITAMIN D3) 10 mcg (400 unit) cap Take 1 capsule (400 Units total) by mouth daily. 30 each 0   ??? diclofenac sodium (VOLTAREN) 1 % gel Apply 2 g topically daily as needed for arthritis.     ??? empty container (SHARPS CONTAINER) Misc use as directed 1 each 2   ??? epoetin alfa (EPOGEN,PROCRIT) 20,000 unit/mL injection Inject 1 mL (20,000 Units total) under the skin Every Tuesday and Friday. (Patient taking differently: Inject 20,000 Units under the skin Every Monday and Thursday. ) 12 mL 2   ??? folic acid (FOLVITE) 1 MG tablet Take 1 tablet (1 mg total) by mouth daily. 30 tablet 11   ??? nystatin-triamcinolone (MYCOLOG) 100,000-0.1 unit/gram-% ointment Apply topically Two (2) times a day. 30 g 0   ??? OXYGEN-AIR DELIVERY SYSTEMS MISC 2 L/min by Each Nare route nightly. [employer]     ??? phenytoin (DILANTIN) 100 MG ER capsule Take 2 capsules (200 mg total) by mouth Two (2) times a day. 120 capsule 11   ??? promethazine (PHENERGAN) 12.5 MG tablet Take 12.5 mg by mouth every six (6) hours as needed for nausea.     ??? sodium polystyrene (KAYEXALATE) powder Take 15 g by mouth continuous as needed (with high potassium food (e.g. banana)).     ??? voxelotor (OXBRYTA) 500 mg tablet Take 3 tablets (1,500 mg total) by mouth daily. 90 tablet 0     No current facility-administered medications for this visit. Changes to medications: [name] reports no changes at this time.    No Known Allergies    Changes to allergies: No    SPECIALTY MEDICATION ADHERENCE     Procrit 57846 units/mL: 10 days of medicine on hand   Oxbryta 500 mg: 8 days of medicine on hand      Specialty medication(s) dose(s) confirmed: Regimen is correct and unchanged.     Are there any concerns with adherence? No    Adherence counseling provided? Not needed    CLINICAL MANAGEMENT AND INTERVENTION      Clinical Benefit Assessment:    Do you feel the medicine is effective or helping your condition? Yes    Clinical Benefit counseling provided? Not needed    Adverse Effects Assessment:    Are you experiencing any side effects? No    Are you experiencing difficulty administering your medicine? No    Quality of Life Assessment:    How many days over the past month did your sickle cell disease  keep you from your normal activities? For example, brushing your teeth or getting up in the morning. 0    Have you discussed this with your provider? Not needed    Therapy Appropriateness:    Is therapy appropriate? Yes, therapy is appropriate and should be continued    DISEASE/MEDICATION-SPECIFIC INFORMATION  For patients on injectable medications: Patient currently has 4 doses left.  Next injection is scheduled for tomorrow.    PATIENT SPECIFIC NEEDS     ? Does the patient have any physical, cognitive, or cultural barriers? No    ? Is the patient high risk? No     ? Does the patient require a Care Management Plan? No     ? Does the patient require physician intervention or other additional services (i.e. nutrition, smoking cessation, social work)? No      SHIPPING     Specialty Medication(s) to be Shipped:   Hematology/Oncology: Procrit and Oxbryta    Other medication(s) to be shipped: syringes     Changes to insurance: No    Delivery Scheduled: Yes, Expected medication delivery date: 03/12/19 for Procrit and syringes and 03/14/19 for Oxbryta.  However, Rx request for refills was sent to the provider as there are none remaining.     Medication will be delivered via Same Day Courier to the confirmed home address in [facility].    The patient will receive a drug information handout for each medication shipped and additional FDA Medication Guides as required.  Verified that patient has previously received a Conservation officer, historic buildings.    All of the patient's questions and concerns have been addressed.    [name] [facility] Specialty Pharmacist

## 2019-03-08 NOTE — Unmapped (Signed)
Clinical Pharmacist Practitioner: Sickle Clinic     [name] [name] is a 48 y.o. male with hgb SS disease who I am seeing today for follow-up of voxelotor.    Plan:  -Continue with current dose of voxelotor  -Labs locally next Monday 7/20  -RTC 7/30    Counseled patient on:  -taking promethazine prn nausea  -calling for any new symptoms    [name] [name] verbalized understanding of the education provided and had no further questions.       F/u: 7/30 with Dr Clarene Duke    Approximate face time spent with patient: 50 minutes     [name], PharmD, BCOP, CPP  Clinical Pharmacist Practitioner, Benign Hematology    ___________________________________________________________________     Voxelotor Review  Interim history:  Patient reported following:  -upset stomach on the whole day on Sunday 7/12, was not able to keep food down, but recovered on Monday  -no allergy symptoms  -no other new symptoms     Patient received epoetin 20,000 units in the clinic. Was dizzy when walking out of the clinic. Repeat vitals the same as at the start of visit. Patient was brought back into the clinic and was provided with some snacks and drink. Patient reported no dizziness, and was able to leave the clinic.    Addendum: patient was still in his truck ~15 minutes after he left the clinic, feeling dizzy when Cheila and I checked on the patient. We suggested that he go to the urgent care.    Medication access: [facility], copay $0    Adherence:   Patient reported missing 0 doses in the past week  Patient has a pillbox yes    Drug-drug interaction  New medications: no   Medications discontinued: no  Potential drug, herb or dietary supplement interactions no    Side Effects     Headache: no     Diarrhea: no     Nausea yes Emesis yes  -frequency once on Sunday 7/12, none after sunday    -required medications no, What medication? Patient has promethazine 12.5mg  tablets at home  -required ED visit no  -requried hospital admission no     Others none Pain  Last sickle crisis: may 2020  number of sickle crisis since last visit: 0  number of ED visits since last visit 0  number of hospital admissions since last visit 0            TEST INTERVAL Baseline Wk 1 ??Wk 2   Voxelotor start date: wed 02/28/19             02/21/19     7/10 ??    7/15   Insurance approval ?? Yes, copay $0 ?? ?? ??   Structured ROS (AE: diarrhea, headache, adherence, drug interactions) Every visit Diarrhea ?? no ??no     Nausea  no yes     Headache ?? no ??no     Missed doses ?? no ??no     Drug interactions ?? yes ??no   Contraception Every visit ?? yes ??yes   Baseline and every 6months ?? pending ??7/30   CBCD/ retic  Pre, 2 weeks and monthly Wbc 7.0 5.6 ??4.9     Hgb 5.8 5.6 ??5.6     Plt 212 206 ??167     retic 187 134 ??173   CMP  Baseline and monthly  Scr 0.63 0.83 0.93     Tbili 5.3 4.1 ??4.5     Dbili 1.7 1.9  2.4     AST 146 119 ??124     ALT 29 23 ??24   LDH Baseline and monthly 4516 4422 ??4183   BNP Baseline and monthly 870 (4/20) ?? 790??   Promise Fatigue SF (7 items) Baseline and every 2 months  33 ?? ??   ASCQ-Me Pain impact SF Baseline and every 2 months  ??  25   ASCQ-ME Pain Interference SF Baseline and every 2 months                40.7 ?? ??   Number of transfused RBC units OR  Interval of RBCV exchange transfusion Monthly                 none ??none none

## 2019-03-08 NOTE — Unmapped (Signed)
Late entry  July 15th Urgent Care called saying [name] was still light headed and had fallen.  Agreed with admission, where I would avoid excess fluids or transfusion unless emergent, and instead increase EPO dose, or reinstitute if he has not been able to take EPO at home.

## 2019-03-09 MED ORDER — BD TUBERCULIN SYRINGE 1 ML 25 GAUGE X 5/8"
2 refills | 28.00000 days | Status: CP
Start: 2019-03-09 — End: 2019-05-10
  Filled 2019-03-12: qty 8, 28d supply, fill #0

## 2019-03-09 NOTE — Unmapped (Signed)
Procrit dose increase.  Patient is aware dose went from Procrit 20,000units to 40,000units.  Counseled [name] [name] that he is still injecting32mL twice weekly.  Acknowledged changes.    [name], PharmD  [facility]

## 2019-03-12 MED ORDER — PROMETHAZINE 12.5 MG TABLET
Freq: Four times a day (QID) | ORAL | 0 refills | 5.00000 days | Status: CP | PRN
Start: 2019-03-12 — End: 2019-03-20
  Filled 2019-03-13: qty 20, 7d supply, fill #0

## 2019-03-12 MED FILL — BD TUBERCULIN SYRINGE 1 ML 25 GAUGE X 5/8": 28 days supply | Qty: 8 | Fill #0 | Status: AC

## 2019-03-12 MED FILL — VOXELOTOR (OXBRYTA) 500 MG TABLET: 30 days supply | Qty: 90 | Fill #0 | Status: AC

## 2019-03-12 MED FILL — PROCRIT 40,000 UNIT/ML INJECTION SOLUTION: 28 days supply | Qty: 8 | Fill #0 | Status: AC

## 2019-03-12 NOTE — Unmapped (Signed)
[name] called to report feeling extreme fatigue (he can't even get out of bed) and feeling nauseated.  He feels it is due to Voxelotor 400mg  tablets.  He was advised to take one of his nausea medications (promethazine) 30 minutes prior to taking voxelotor.  A refill has been sent to provider and delivery scheduled for 03/14/19.  Message was also routed to [facility].    [name], PharmD  [facility]

## 2019-03-13 MED FILL — PROMETHAZINE 12.5 MG TABLET: 7 days supply | Qty: 20 | Fill #0 | Status: AC

## 2019-03-13 NOTE — Unmapped (Deleted)
{** REMINDER - THIS NOTE IS NOT A FINAL MEDICAL RECORD UNTIL IT IS SIGNED.  UNTIL THEN, THE CONTENT BELOW MAY REFLECT INFORMATION FROM A DOCUMENTATION TEMPLATE, NOT THE ACTUAL PATIENT VISIT. **}    PCP:  [name] Maudlin, MD    03/12/2019    Chief Complaint: Follow up visit Hyperkalemia, sickle cell disease, back pain    Background:     HPI:  [name] is a 48 y.o. year old male with a past medical history significant for sickle cell disease and hyperkalemia who is being seen for follow up visit.     Patient was last seen by me on 02/15/17. Since then, he arrived for follow-up visit in 04/2017 but left 20 minutes after appointment time slot without being seen.    He underwent right heart catheterization on 07/18/17.    He was admitted 7/11-7/12/19 from sickle cell clinic for symptomatic anemia without evidence of bleeding. Hemoglobin was low at 5.2. He improved after receiving 2u pRBCs and was deemed stable for discharge.    He was admitted 1/31-09/25/18 for sudden onset of lower extremity edema. He was diuresed with IV and PO Lasix and transfused 1u pRBC with good response. PVLs were negative for DVT. Echo and EKG were both normal.    He was admitted 4/15-4/22/20 for hypoxemia (O2 sat 86%) and anemia (Hgb 5.4). COVID and respiratory path panel were both negative. Low suspicion for other acute chest process or infection; likely due to anemia.    He was presented to Urgent Care on 03/02/19 for a groin rash. Discharged with Mycolog.    He recieved Epoetin 20,000u on 03/07/19 with Hematology. He began to experience dizziness upon getting up to leave. He was subsequently taken to Urgent Care for further evaluation. While being worked up, he had a fall (unclear if this was a syncopal episode or not). He was transferred to the ED.    He continues to follow regularly with Neurology, HemOnc, and Pulmonary Disease.    He presents today ***    Since last visit, patient was hospitalized in March for left hip pain after a fall (no fracture) and right foot pain c/w sickle cell pain. He was also given 2 prbc transfusions for symptomatic anemia (ongoing dyspnea and fatigue, Hgb 4.5 at that time). He was discharged with naproxen (which he states he has only taken 1-2 times since).    He has since followed up with hematology, and was found to by hypoxic on ambulation (81%). Also found to have a dilated RV on echo, prompting referral to pulmonology clinic due to c/f pulmonary HTN. VQ scan was performed with no evidence of thrombosis. Per note, they may proceed with a CT scan to evaluate for vaso-occlusion related fibrosis and RHC. They also recommended using nightly oxygen. Unfortunately, patient has not been using oxygen nightly, because of discomfort. Does not have a humidifier.   Both pulmonology and hematology have recommended the initiation of hydroxyurea, which he has been declining because he feels that he is on too many medications.   He has also followed up with neurology for hx of complex partial seizure. At that time, he did not want anticonvulsant levels to be drawn. However, levels have been subtherapeutic in the past. Dosing was not changed, as he seemed to be without seizure on these doses at last neurology visit. However, he reports that he likely did have a seizure yesterday (He states that he noticed that he was chewing his tongue and his  entire body was shaking, and possibly with loss of consciousness, but he recalls episode).    His only other complaint today is mild lumbar back pain for 3 days that is improving. He feels that he may have pulled a muscle. No association with urinating. NO change in urinary frequency, volume. Does report that it has been dark yellow to orange for 3 months. He has had no fevers, chills, nausea, emesis, diarrhea.     ROS:   All systems reviewed and are negative except as listed above.    PAST MEDICAL HISTORY:  Past Medical History:   Diagnosis Date   ??? Anemia    ??? Foot pain    ??? 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 Hypertension    ??? Pulmonary hypertension (CMS-HCC)    ??? Seizures (CMS-HCC)    ??? Sickle cell anemia (CMS-HCC)    ??? Vitamin D deficiency        ALLERGIES  Patient has no known allergies.    MEDICATIONS:  Current Outpatient Medications   Medication Sig Dispense Refill   ??? carBAMazepine (TEGRETOL  XR) 400 MG 12 hr tablet Take 2 tablets (800 mg total) by mouth Two (2) times a day. 120 tablet 11   ??? cholecalciferol, vitamin D3, (VITAMIN D3) 10 mcg (400 unit) cap Take 1 capsule (400 Units total) by mouth daily. 30 each 0   ??? diclofenac sodium (VOLTAREN) 1 % gel Apply 2 g topically daily as needed for arthritis.     ??? empty container (SHARPS CONTAINER) Misc use as directed 1 each 2   ??? epoetin alfa (EPOGEN,PROCRIT) 40,000 unit/mL injection Inject 1 mL (40,000 Units total) under the skin Two (2) times a week. 8 mL 3   ??? folic acid (FOLVITE) 1 MG tablet Take 1 tablet (1 mg total) by mouth daily. 30 tablet 11   ??? nystatin-triamcinolone (MYCOLOG) 100,000-0.1 unit/gram-% ointment Apply topically Two (2) times a day. 30 g 0   ??? OXYGEN-AIR DELIVERY SYSTEMS MISC 2 L/min by Each Nare route nightly. [facility]     ??? phenytoin (DILANTIN) 100 MG ER capsule Take 2 capsules (200 mg total) by mouth Two (2) times a day. 120 capsule 11   ??? promethazine (PHENERGAN) 12.5 MG tablet Take 12.5 mg by mouth every six (6) hours as needed for nausea.     ??? promethazine (PHENERGAN) 12.5 MG tablet Take 1 tablet (12.5 mg total) by mouth every six (6) hours as needed for nausea for up to 7 days. 20 each 0   ??? sodium polystyrene (KAYEXALATE) powder Take 15 g by mouth continuous as needed (with high potassium food (e.g. banana)).     ??? syringe with needle (BD TUBERCULIN SYRINGE) 1 mL 25 gauge x 5/8 Syrg 1 each by Miscellaneous route Every Tuesday and Friday. 8 each 2   ??? voxelotor (OXBRYTA) 500 mg tablet Take 3 tablets (1,500 mg total) by mouth daily. 90 tablet 2     No current facility-administered medications for this visit.        PHYSICAL EXAM:  There were no vitals filed for this visit.  There were no vitals filed for this visit.  There is no height or weight on file to calculate BMI.    CONSTITUTIONAL: Thin AAM, Alert, well appearing, no distress  HEENT: Moist mucous membranes, oropharynx clear without erythema or exudate, noticeable scleral icterus  CARDIOVASCULAR: Regular, normal S1/S2 heart sounds, soft 2/6 SEM at LLSB, no rubs.   PULM: Clear to auscultation  bilaterally  Back: ***Lumbar paraspinal muscles appear to be in a spasm, no tenderness along spine or with palpation, no limitation in ROM.   EXTREMITIES: No lower extremity edema bilaterally.     MEDICAL DECISION MAKING  Admission on 03/07/2019, Discharged on 03/07/2019   Component Date Value Ref Range Status   ??? EKG Ventricular Rate 03/07/2019 57  BPM Final   ??? EKG Atrial Rate 03/07/2019 57  BPM Final   ??? EKG P-R Interval 03/07/2019 194  ms Final   ??? EKG QRS Duration 03/07/2019 108  ms Final   ??? EKG Q-T Interval 03/07/2019 482  ms Final   ??? EKG QTC Calculation 03/07/2019 469  ms Final   ??? EKG Calculated P Axis 03/07/2019 26  degrees Final   ??? EKG Calculated R Axis 03/07/2019 38  degrees Final   ??? EKG Calculated T Axis 03/07/2019 33  degrees Final   ??? QTC Fredericia 03/07/2019 473  ms Final   ??? Phenytoin Lvl 03/07/2019 10.1  10.0 - 20.0 ug/mL Final   ??? Carbamazepine Level 03/07/2019 6.0  4.0 - 12.0 ug/mL Final   Office Visit on 03/07/2019   Component Date Value Ref Range Status   ??? Color, UA 03/07/2019 Amber   Final   ??? Clarity, UA 03/07/2019 Clear   Final   ??? Glucose, UA 03/07/2019 Negative  Negative Final   ??? Bilirubin, UA 03/07/2019 Small  Negative Final   ??? Ketones, POC 03/07/2019 Negative  Negative Final   ??? Spec Grav, UA 03/07/2019 1.015  1.005 - 1.030 Final   ??? Blood, UA 03/07/2019 Negative  Negative Final   ??? pH, UA 03/07/2019 7.0  5.0 - 9.0 Final   ??? Protein, UA 03/07/2019 Negative  Negative Final   ??? Urobilinogen, UA 03/07/2019 2.0 mg/dL  Negative (0.2 mg/dL) Final   ??? Nitrite, UA 03/07/2019 Negative  Negative Final   ??? Leukocytes, UA 03/07/2019 Negative  Negative Final   ??? UA Strip Lot Number 03/07/2019 [id number]   Final   ??? UA Strip Lot Expiration 03/07/2019 02/20/2020   Final   ??? Glucose, POC 03/07/2019 96  65 - 179 mg/dL Final   ??? Glucose Strip Lot Num 03/07/2019 [id number]   Final   ??? Glucose Strip Exp 03/07/2019 08/28/2019   Final   Office Visit on 03/07/2019   Component Date Value Ref Range Status   ??? LDH 03/07/2019 4,183* 338 - 610 U/L Final   ??? Bilirubin, Direct 03/07/2019 2.40* 0.00 - 0.40 mg/dL Final   ??? PRO-BNP 82/95/6213 790.0* 0.0 - 138.0 pg/mL Final   Appointment on 03/07/2019   Component Date Value Ref Range Status   ??? Sodium 03/07/2019 139  135 - 145 mmol/L Final   ??? Potassium 03/07/2019 5.0  3.5 - 5.0 mmol/L Final   ??? Chloride 03/07/2019 106  98 - 107 mmol/L Final   ??? CO2 03/07/2019 24.0  22.0 - 30.0 mmol/L Final   ??? Anion Gap 03/07/2019 9  7 - 15 mmol/L Final   ??? BUN 03/07/2019 18  7 - 21 mg/dL Final   ??? Creatinine 03/07/2019 0.93  0.70 - 1.30 mg/dL Final   ??? BUN/Creatinine Ratio 03/07/2019 19   Final   ??? EGFR CKD-EPI Non-African American,* 03/07/2019 >90  >=60 mL/min/1.76m2 Final   ??? EGFR CKD-EPI African American, Male 03/07/2019 >90  >=60 mL/min/1.69m2 Final   ??? Glucose 03/07/2019 113  70 - 179 mg/dL Final   ??? Calcium 08/65/7846 8.5  8.5 - 10.2 mg/dL Final   ???  Albumin 03/07/2019 3.7  3.5 - 5.0 g/dL Final   ??? Total Protein 03/07/2019 8.4* 6.5 - 8.3 g/dL Final   ??? Total Bilirubin 03/07/2019 4.5* 0.0 - 1.2 mg/dL Final   ??? Bilirubin, Direct 03/07/2019 2.40* 0.00 - 0.40 mg/dL Final   ??? AST 16/05/9603 124* 19 - 55 U/L Final   ??? ALT 03/07/2019 24  <50 U/L Final   ??? Alkaline Phosphatase 03/07/2019 219* 38 - 126 U/L Final   ??? Reticulocyte Manual % 03/07/2019 11.0* 0.8 - 2.0 % Final   ??? Absolute Manual Reticulocyte 03/07/2019 173.8* 27.0 - 120.0 10*9/L Final   ??? WBC 03/07/2019 4.9  4.5 - 11.0 10*9/L Final   ??? RBC 03/07/2019 1.58* 4.50 - 5.90 10*12/L Final   ??? HGB 03/07/2019 5.6* 13.5 - 17.5 g/dL Final   ??? HCT 54/04/8118 17.6* 41.0 - 53.0 % Final   ??? MCV 03/07/2019 111.5* 80.0 - 100.0 fL Final   ??? MCH 03/07/2019 35.7* 26.0 - 34.0 pg Final   ??? MCHC 03/07/2019 32.0  31.0 - 37.0 g/dL Final   ??? RDW 14/78/2956 21.1* 12.0 - 15.0 % Final   ??? MPV 03/07/2019 8.4  7.0 - 10.0 fL Final   ??? Platelet 03/07/2019 167  150 - 440 10*9/L Final   ??? nRBC 03/07/2019 31* <=4 /100 WBCs Final   ??? Variable HGB Concentration 03/07/2019 Moderate* Not Present Final   ??? Neutrophils % 03/07/2019 33.5  % Final   ??? Lymphocytes % 03/07/2019 41.9  % Final   ??? Monocytes % 03/07/2019 18.7  % Final   ??? Eosinophils % 03/07/2019 1.4  % Final   ??? Basophils % 03/07/2019 0.3  % Final   ??? Absolute Neutrophils 03/07/2019 2.1  2.0 - 7.5 10*9/L Final   ??? Absolute Lymphocytes 03/07/2019 2.7  1.5 - 5.0 10*9/L Final   ??? Absolute Monocytes 03/07/2019 1.2* 0.2 - 0.8 10*9/L Final   ??? Absolute Eosinophils 03/07/2019 0.1  0.0 - 0.4 10*9/L Final   ??? Absolute Basophils 03/07/2019 0.0  0.0 - 0.1 10*9/L Final   ??? Large Unstained Cells 03/07/2019 4  0 - 4 % Final   ??? Macrocytosis 03/07/2019 Marked* Not Present Final   ??? Anisocytosis 03/07/2019 Moderate* Not Present Final   ??? Hypochromasia 03/07/2019 Moderate* Not Present Final   ??? Smear Review Comments 03/07/2019 See Comment* Undefined Final   ??? Giant Platelets 03/07/2019 Present* Not Present Final   ??? Polychromasia 03/07/2019 Slight* Not Present Final   ??? Poikilocytosis 03/07/2019 Moderate* Not Present Final       Creatinine   Date Value Ref Range Status   03/07/2019 0.93 0.70 - 1.30 mg/dL Final   21/30/8657 8.46 0.70 - 1.30 mg/dL Final   96/29/5284 1.32 (L) 0.70 - 1.30 mg/dL Final   44/08/270 5.36 0.70 - 1.30 mg/dL Final   64/40/3474 2.59 0.70 - 1.30 mg/dL Final   56/38/7564 3.32 0.70 - 1.30 mg/dL Final   95/18/8416 6.06 (L) 0.70 - 1.30 mg/dL Final   30/16/0109 3.23 0.70 - 1.30 mg/dL Final   55/73/2202 5.42 0.70 - 1.30 mg/dL Final   70/62/3762 8.31 0.70 - 1.30 mg/dL Final   51/76/1607 3.71 0.70 - 1.30 mg/dL Final   02/16/9484 4.62 0.70 - 1.30 mg/dL Final   70/35/0093 8.18 0.70 - 1.30 mg/dL Final   29/93/7169 6.78 0.70 - 1.30 MG/DL Final   93/81/0175 1.02 0.70 - 1.30 MG/DL Final   58/52/7782 4.23 0.70 - 1.30 MG/DL Final   53/61/4431 5.40 0.70 - 1.30  MG/DL Final     Urine Sediment: numerous bacteria, rare WBC, no notable RBCs    ASSESSMENT/PLAN:  [name] is a 48 y.o. patient with a past medical history significant for sickle cell disease and hyperkalemia who is being seen for follow up visit.     1. 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 Last several values have been ok. Uses kayexalate when he eats high K food. Had been on fludrocortisone in the past with some success but stopped this due to cost. If K remains stable, would continue with the present regimen.  - return in 2 weeks for BMP    2. ?CKD  PTH elevated at prior visit, so questions possible development of CKD as well. Found to have low vitamin D levels, and replacement started.  - Continue Vitamin D supplementation and recheck levels on follow up.     3. Sickle cell anemia - the patient is followed by our hematology clinic. Dr. Sherwood Gambler was his prior hematologist. We will need to ensure that he has follow up with hematology.   - Start hydroxyurea 500 mg daily today. Patient instructed to return in 2 weeks for cbc.      4. Proteinuria - continues to have mild proteinuria/moderate albuminuria; not a candidate for ACE-I/ARB due to issue with hyperkalemia; could potentially benefit from hydroxyurea.     5. Seizure d/o -  On dilantin and tegretol. levels have been subtherapeutic in the past. He reports that he my have had a seizure yesterday (with tongue biting, body shaking, and possible loss of consciousness). Also, suspect hypoxia may be decreasing seizure threshold.   - Neurologist notified.   - Patient encouraged to wear oxygen    6. Dyspnea and hypoxia on exertion.   In recent hematology appt, and was found to by hypoxic on ambulation (81%). Also found to have a dilated RV on echo, prompting referral to pulmonology clinic due to c/f pulmonary HTN. VQ scan was performed with no evidence of thrombosis. Per note, they may proceed with a CT scan to evaluate for vaso-occlusion related fibrosis and RHC. They also recommended using nightly oxygen. Unfortunately, patient has not been using oxygen nightly, because of discomfort. Does not have a humidifier.  - Patient encouraged to use nightly oxygen  - Will notify hematology or pulmonology to consider humidifier to encourage nighttime compliance.    7. Health Maintenance - up to date on vaccinations.  Immunization History   Administered Date(s) Administered   ??? Hepatitis B, Adult 03/25/2009, 04/24/2009   ??? INFLUENZA TIV (TRI) PF (IM) 07/21/2009, 09/12/2012   ??? Influenza Vaccine Quad (IIV4 PF) 72mo+ injectable 07/04/2015, 05/07/2016, 04/20/2018   ??? Influenza Virus Vaccine, unspecified formulation 10/11/2018   ??? Meningococcal C Conjugate 03/27/2013   ??? PNEUMOCOCCAL POLYSACCHARIDE 23 03/25/2009, 11/11/2017   ??? Pneumococcal Conjugate 13-Valent 07/04/2015         [name] will follow up in 4-6 months or sooner if indicated.

## 2019-03-13 NOTE — Unmapped (Deleted)
{** REMINDER - THIS NOTE IS NOT A FINAL MEDICAL RECORD UNTIL IT IS SIGNED. ??UNTIL THEN, THE CONTENT BELOW MAY REFLECT INFORMATION FROM A DOCUMENTATION TEMPLATE, NOT THE ACTUAL PATIENT VISIT. **}  ??  PCP:  Fredirick Maudlin, MD  ??  03/12/2019  ??  Chief Complaint: Follow up visit Hyperkalemia, sickle cell disease, back pain  ??  Background:   ??  HPI:  [name] is a 48 y.o. year old male with a past medical history significant for sickle cell disease and hyperkalemia who is being seen for follow up visit.   ??  Patient was last seen by me on 02/15/17. Since then, he arrived for follow-up visit in 04/2017 but left 20 minutes after appointment time slot without being seen.  ??  He underwent right heart catheterization on 07/18/17.  ??  He was admitted 7/11-7/12/19 from sickle cell clinic for symptomatic anemia without evidence of bleeding. Hemoglobin was low at 5.2. He improved after receiving 2u pRBCs and was deemed stable for discharge.  ??  He was admitted 1/31-09/25/18 for sudden onset of lower extremity edema. He was diuresed with IV and PO Lasix and transfused 1u pRBC with good response. PVLs were negative for DVT. Echo and EKG were both normal.  ??  He was admitted 4/15-4/22/20 for hypoxemia (O2 sat 86%) and anemia (Hgb 5.4). COVID and respiratory path panel were both negative. Low suspicion for other acute chest process or infection; likely due to anemia.  ??  He was presented to Urgent Care on 03/02/19 for a groin rash. Discharged with Mycolog.  ??  He recieved Epoetin 20,000u on 03/07/19 with Hematology. He began to experience dizziness upon getting up to leave. He was subsequently taken to Urgent Care for further evaluation. While being worked up, he had a fall (unclear if this was a syncopal episode or not). He was transferred to the ED.  ??  He continues to follow regularly with Neurology, HemOnc, and Pulmonary Disease.  ??  He presents today ***  ??  Since last visit, patient was hospitalized in March for left hip pain after a fall (no fracture) and right foot pain c/w sickle cell pain. He was also given 2 prbc transfusions for symptomatic anemia (ongoing dyspnea and fatigue, Hgb 4.5 at that time). He was discharged with naproxen (which he states he has only taken 1-2 times since).               He has since followed up with hematology, and was found to by hypoxic on ambulation (81%). Also found to have a dilated RV on echo, prompting referral to pulmonology clinic due to c/f pulmonary HTN. VQ scan was performed with no evidence of thrombosis. Per note, they may proceed with a CT scan to evaluate for vaso-occlusion related fibrosis and RHC. They also recommended using nightly oxygen. Unfortunately, patient has not been using oxygen nightly, because of discomfort. Does not have a humidifier.              Both pulmonology and hematology have recommended the initiation of hydroxyurea, which he has been declining because he feels that he is on too many medications.              He has also followed up with neurology for hx of complex partial seizure. At that time, he did not want anticonvulsant levels to be drawn. However, levels have been subtherapeutic in the past. Dosing was not changed, as he seemed to be without  seizure on these doses at last neurology visit. However, he reports that he likely did have a seizure yesterday (He states that he noticed that he was chewing his tongue and his entire body was shaking, and possibly with loss of consciousness, but he recalls episode).               His only other complaint today is mild lumbar back pain for 3 days that is improving. He feels that he may have pulled a muscle. No association with urinating. NO change in urinary frequency, volume. Does report that it has been dark yellow to orange for 3 months. He has had no fevers, chills, nausea, emesis, diarrhea.       ROS:   All systems reviewed and are negative except as listed above.  ??  PAST MEDICAL HISTORY:  Past Medical History:   Diagnosis Date   ??? Anemia    ??? Foot pain    ??? 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 Hypertension    ??? Pulmonary hypertension (CMS-HCC)    ??? Seizures (CMS-HCC)    ??? Sickle cell anemia (CMS-HCC)    ??? Vitamin D deficiency        ??  ALLERGIES  No Known Allergies    ??  MEDICATIONS:    Current Outpatient Medications:   ???  carBAMazepine (TEGRETOL  XR) 400 MG 12 hr tablet, Take 2 tablets (800 mg total) by mouth Two (2) times a day., Disp: 120 tablet, Rfl: 11  ???  cholecalciferol, vitamin D3, (VITAMIN D3) 10 mcg (400 unit) cap, Take 1 capsule (400 Units total) by mouth daily., Disp: 30 each, Rfl: 0  ???  diclofenac sodium (VOLTAREN) 1 % gel, Apply 2 g topically daily as needed for arthritis., Disp: , Rfl:   ???  empty container (SHARPS CONTAINER) Misc, use as directed, Disp: 1 each, Rfl: 2  ???  epoetin alfa (EPOGEN,PROCRIT) 40,000 unit/mL injection, Inject 1 mL (40,000 Units total) under the skin Two (2) times a week., Disp: 8 mL, Rfl: 3  ???  folic acid (FOLVITE) 1 MG tablet, Take 1 tablet (1 mg total) by mouth daily., Disp: 30 tablet, Rfl: 11  ???  nystatin-triamcinolone (MYCOLOG) 100,000-0.1 unit/gram-% ointment, Apply topically Two (2) times a day., Disp: 30 g, Rfl: 0  ???  OXYGEN-AIR DELIVERY SYSTEMS MISC, 2 L/min by Each Nare route nightly. [employer], Disp: , Rfl:   ???  phenytoin (DILANTIN) 100 MG ER capsule, Take 2 capsules (200 mg total) by mouth Two (2) times a day., Disp: 120 capsule, Rfl: 11  ???  promethazine (PHENERGAN) 12.5 MG tablet, Take 12.5 mg by mouth every six (6) hours as needed for nausea., Disp: , Rfl:   ???  promethazine (PHENERGAN) 12.5 MG tablet, Take 1 tablet (12.5 mg total) by mouth every six (6) hours as needed for nausea for up to 7 days., Disp: 20 each, Rfl: 0  ???  sodium polystyrene (KAYEXALATE) powder, Take 15 g by mouth continuous as needed (with high potassium food (e.g. banana))., Disp: , Rfl:   ???  syringe with needle (BD TUBERCULIN SYRINGE) 1 mL 25 gauge x 5/8 Syrg, 1 each by Miscellaneous route Every Tuesday and Friday., Disp: 8 each, Rfl: 2  ???  voxelotor (OXBRYTA) 500 mg tablet, Take 3 tablets (1,500 mg total) by mouth daily., Disp: 90 tablet, Rfl: 2     PHYSICAL EXAM:  There were no vitals taken for this visit.  CONSTITUTIONAL: Thin AAM, Alert, well appearing, no  distress  HEENT: Moist mucous membranes, oropharynx clear without erythema or exudate, noticeable scleral icterus  CARDIOVASCULAR: Regular, normal S1/S2 heart sounds, soft 2/6 SEM at LLSB, no rubs.   PULM: Clear to auscultation bilaterally  Back: ***Lumbar paraspinal muscles appear to be in a spasm, no tenderness along spine or with palpation, no limitation in ROM.   EXTREMITIES: No lower extremity edema bilaterally.     Admission on 03/07/2019, Discharged on 03/07/2019   Component Date Value Ref Range Status   ??? EKG Ventricular Rate 03/07/2019 57  BPM Final   ??? EKG Atrial Rate 03/07/2019 57  BPM Final   ??? EKG P-R Interval 03/07/2019 194  ms Final   ??? EKG QRS Duration 03/07/2019 108  ms Final   ??? EKG Q-T Interval 03/07/2019 482  ms Final   ??? EKG QTC Calculation 03/07/2019 469  ms Final   ??? EKG Calculated P Axis 03/07/2019 26  degrees Final   ??? EKG Calculated R Axis 03/07/2019 38  degrees Final   ??? EKG Calculated T Axis 03/07/2019 33  degrees Final   ??? QTC Fredericia 03/07/2019 473  ms Final   ??? Phenytoin Lvl 03/07/2019 10.1  10.0 - 20.0 ug/mL Final   ??? Carbamazepine Level 03/07/2019 6.0  4.0 - 12.0 ug/mL Final     Urine Sediment: numerous bacteria, rare WBC, no notable RBCs  ??  ASSESSMENT/PLAN:  Mr.Ransom Meriel [name] is a 47 y.o. patient with a past medical history significant for sickle cell disease and hyperkalemia who is being seen for follow up visit.   ??  1. 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 Last several values have been ok. Uses kayexalate when he eats high K food. Had been on fludrocortisone in the past with some success but stopped this due to cost. If K remains stable, would continue with the present regimen.  - return in 2 weeks for BMP  ??  2. ?CKD  PTH elevated at prior visit, so questions possible development of CKD as well. Found to have low vitamin D levels, and replacement started.  - Continue Vitamin D supplementation and recheck levels on follow up.   ??  3. Sickle cell anemia - the patient is followed by our hematology clinic. Dr. Sherwood Gambler was his prior hematologist. We will need to ensure that he has follow up with hematology.   - Start hydroxyurea 500 mg daily today. Patient instructed to return in 2 weeks for cbc.    ??  4. Proteinuria - continues to have mild proteinuria/moderate albuminuria; not a candidate for ACE-I/ARB due to issue with hyperkalemia; could potentially benefit from hydroxyurea.   ??  5. Seizure d/o -  On dilantin and tegretol. levels have been subtherapeutic in the past. He reports that he my have had a seizure yesterday (with tongue biting, body shaking, and possible loss of consciousness). Also, suspect hypoxia may be decreasing seizure threshold.   - Neurologist notified.   - Patient encouraged to wear oxygen  ??  6. Dyspnea and hypoxia on exertion.   In recent hematology appt, and was found to by hypoxic on ambulation (81%). Also found to have a dilated RV on echo, prompting referral to pulmonology clinic due to c/f pulmonary HTN. VQ scan was performed with no evidence of thrombosis. Per note, they may proceed with a CT scan to evaluate for vaso-occlusion related fibrosis and RHC. They also recommended using nightly oxygen. Unfortunately, patient has not been using oxygen nightly, because of  discomfort. Does not have a humidifier.  - Patient encouraged to use nightly oxygen  - Will notify hematology or pulmonology to consider humidifier to encourage nighttime compliance.  ??  7. Health Maintenance - up to date on vaccinations.  Immunization History   Administered Date(s) Administered   ??? Hepatitis B, Adult 03/25/2009, 04/24/2009   ??? INFLUENZA TIV (TRI) PF (IM) 07/21/2009, 09/12/2012   ??? Influenza Vaccine Quad (IIV4 PF) 90mo+ injectable 07/04/2015, 05/07/2016, 04/20/2018   ??? Influenza Virus Vaccine, unspecified formulation 10/11/2018   ??? Meningococcal C Conjugate 03/27/2013   ??? PNEUMOCOCCAL POLYSACCHARIDE 23 03/25/2009, 11/11/2017   ??? Pneumococcal Conjugate 13-Valent 07/04/2015     Patient will RTC in ***

## 2019-03-19 NOTE — Unmapped (Signed)
Clinical Pharmacist Practitioner: Sickle Clinic     [name] is a 48 y.o. male with hgb SS disease who I called on Thursday 7/23 for follow-up of epoetin, and voxelotor.    Current epoetin dose: 40,000 units two times weekly    Plan:  -Continue epoetin dose to 40,000 units two times weekly, and voxelotor 1500 mg once daily  --CBC/D, retic on Friday 7/24, if patient feeling ok  --blood draw weekly by [facility], (have faxed orders to [facility] to see if home blood draw are possible)    Counseled patient to :  -continue epoetin 40,000 units injection Monday, thursday    Prescriptions:  -procrit 40,000 units/ml #38mls, 3 refills, syringes with needle 1ml 25G 5/8 #8, 2 refills    Patient verbalized understanding of plan and had no further questions.      F/u: 03/22/19 with Dr Clarene Duke  ___________________________________________________________________    Interim history:  Patient reported following:   -had a very tiring day on monday 7/20, and GI upset and nausea, with reduced oral intake on that day only (gone by Tuesday), relieved by promethazine  -still felt a little tired on Thursday 7/23, but felt able to go out on Friday for lab draw  -gave himself epoetin injection on Thursday, no dose on Monday 7/20    Epoetin dosing history:  Epoetin started on 02/07/19 during hospital admission 4/15-4/22/20  Epoetin 20,000 units two times weekly (4/22-7/13/20)  Epoetin 40,000 units two times weekly (7/15 - present)    Pharmacy: [facility]  Medication Access epoetin: $3.90, voxelotor $0      Approximate telephone fime spent: 10 minutes with patient     [name], PharmD, BCOP, CPP  Clinical Pharmacist Practitioner, Benign Hematology

## 2019-03-19 NOTE — Unmapped (Signed)
We received the referral for home health services for your patient. Unfortunately [facility] are at capacity due to a high rate of hospital discharges and we unable to accommodate the patient at this time.    We were servicing the patient but he stated as documented by our staff.he does want home health anymore(copy of note at bottom of page)  Below are names/contact information for our in network partners:  [facility]ine Contes, phone [phone]  Frances Furbish, phone [phone]  [facility], phone [phone]  [facility], phone [phone]     I'm very sorry for the inconvenience.      Pricilla Riffle, LPN  Central Intake Nurse for [facility]  [phone]      FYI:   From: Donnetta Hail, RN On: 01/05/2019 11:31 AM   To: Julious Oka, MD, Sadie Haber, RN   Priority: Routine   Routing Comments:   HI Dr. Clarene Duke,     [name] refused further [facility] services at this time. He did not provide a reason but I wanted to inform you. Thank you for your referral to [facility].     Kind Regards,

## 2019-03-20 NOTE — Unmapped (Signed)
Attempted to reach patient for scheduled appointment. Patient unreachable. Left voicemail; attempt #2.

## 2019-03-21 ENCOUNTER — Ambulatory Visit: Admit: 2019-03-21 | Discharge: 2019-03-22 | Payer: MEDICARE

## 2019-03-21 ENCOUNTER — Ambulatory Visit: Admit: 2019-03-21 | Discharge: 2019-03-22 | Payer: MEDICARE | Attending: Hematology | Primary: Hematology

## 2019-03-21 DIAGNOSIS — R0600 Dyspnea, unspecified: Principal | ICD-10-CM

## 2019-03-21 DIAGNOSIS — R400 Somnolence: Secondary | ICD-10-CM

## 2019-03-21 DIAGNOSIS — R799 Abnormal finding of blood chemistry, unspecified: Secondary | ICD-10-CM

## 2019-03-21 DIAGNOSIS — D571 Sickle-cell disease without crisis: Principal | ICD-10-CM

## 2019-03-21 DIAGNOSIS — R740 Nonspecific elevation of levels of transaminase and lactic acid dehydrogenase [LDH]: Secondary | ICD-10-CM

## 2019-03-21 DIAGNOSIS — I272 Pulmonary hypertension, unspecified: Secondary | ICD-10-CM

## 2019-03-21 LAB — MANUAL DIFFERENTIAL
BASOPHILS - ABS (DIFF): 0 10*9/L (ref 0.0–0.1)
BASOPHILS - REL (DIFF): 1 %
EOSINOPHILS - ABS (DIFF): 0 10*9/L (ref 0.0–0.4)
EOSINOPHILS - REL (DIFF): 1 %
MONOCYTES - ABS (DIFF): 0.8 10*9/L (ref 0.2–0.8)
NEUTROPHILS - ABS (DIFF): 2.2 10*9/L (ref 2.0–7.5)
NEUTROPHILS - REL (DIFF): 45 %

## 2019-03-21 LAB — CBC W/ AUTO DIFF
HEMATOCRIT: 19.2 % — CL (ref 41.0–53.0)
HEMOGLOBIN: 6.3 g/dL — CL (ref 13.5–17.5)
MEAN CORPUSCULAR HEMOGLOBIN CONC: 32.6 g/dL (ref 31.0–37.0)
MEAN CORPUSCULAR HEMOGLOBIN: 36.3 pg — ABNORMAL HIGH (ref 26.0–34.0)
MEAN CORPUSCULAR VOLUME: 111.6 fL — ABNORMAL HIGH (ref 80.0–100.0)
MEAN PLATELET VOLUME: 9.7 fL (ref 7.0–10.0)
NUCLEATED RED BLOOD CELLS: 24 /100{WBCs} — ABNORMAL HIGH (ref <=4)
PLATELET COUNT: 181 10*9/L (ref 150–440)
RED CELL DISTRIBUTION WIDTH: 22.4 % — ABNORMAL HIGH (ref 12.0–15.0)

## 2019-03-21 LAB — COMPREHENSIVE METABOLIC PANEL
ALBUMIN: 3.9 g/dL (ref 3.5–5.0)
ALKALINE PHOSPHATASE: 252 U/L — ABNORMAL HIGH (ref 38–126)
ALT (SGPT): 24 U/L (ref <50)
ANION GAP: 6 mmol/L — ABNORMAL LOW (ref 7–15)
AST (SGOT): 130 U/L — ABNORMAL HIGH (ref 17–47)
BILIRUBIN TOTAL: 5.4 mg/dL — ABNORMAL HIGH (ref 0.0–1.2)
BLOOD UREA NITROGEN: 16 mg/dL (ref 7–21)
BUN / CREAT RATIO: 19
CALCIUM: 8.6 mg/dL (ref 8.5–10.2)
CO2: 23 mmol/L (ref 22.0–30.0)
CREATININE: 0.85 mg/dL (ref 0.70–1.30)
EGFR CKD-EPI AA MALE: >90 mL/min/{1.73_m2} (ref >=60)
EGFR CKD-EPI NON-AA MALE: >90 mL/min/{1.73_m2} (ref >=60)
GLUCOSE RANDOM: 128 mg/dL (ref 70–179)
POTASSIUM: 4.9 mmol/L (ref 3.5–5.0)
PROTEIN TOTAL: 8.2 g/dL (ref 6.5–8.3)
SODIUM: 138 mmol/L (ref 135–145)

## 2019-03-21 LAB — BILIRUBIN DIRECT: Bilirubin.glucuronidated:MCnc:Pt:Ser/Plas:Qn:: 2.6 — ABNORMAL HIGH

## 2019-03-21 LAB — LACTATE DEHYDROGENASE: Lactate dehydrogenase:CCnc:Pt:Ser/Plas:Qn:: 4460 — ABNORMAL HIGH

## 2019-03-21 LAB — ANION GAP: Anion gap 3:SCnc:Pt:Ser/Plas:Qn:: 6 — ABNORMAL LOW

## 2019-03-21 LAB — BASOPHILS - ABS (DIFF): Lab: 0

## 2019-03-21 LAB — RETICULOCYTE ABSOLUTE COUNT, MANUAL: Lab: 170.3 — ABNORMAL HIGH

## 2019-03-21 LAB — RETICULOCYTES: RETICULOCYTE ABSOLUTE COUNT, MANUAL: 170.3 10*9/L — ABNORMAL HIGH (ref 27.0–120.0)

## 2019-03-21 LAB — SLIDE SCAN

## 2019-03-21 NOTE — Unmapped (Addendum)
It was a pleasure to see you today in clinic!    Plan from today's visit  ? Please have your labs checked every 2 weeks (next labs should be on 8/12).  ? We will call you to discuss the time of your next appointment.  ? Please stop taking Voxelotor  ? Please continue with the Procrit injections twice a week  ? Please continue to use your oxygen at night    How to get in touch with your Sickle Cell Team:     1. If you are sick, and need an ambulance, CALL 911.    ? Otherwise, call [facility] at [phone], 8AM - 5PM, and ask for the NURSE TRIAGE LINE and leave a message   ? On nights and weekends, for urgent problems you can call the [phone] and ask for the HEMATOLOGIST ON CALL.     2.  For an appointment, please call [facility] at [phone], 8AM - 5PM.    3.  For pain medication refills, leave a message on nurse triage line THREE days before refill needed. All other medication refills ask your pharmacy to fax request.       4.  For general questions, call the sickle cell office, [phone]. You can leave non-urgent messages here as well.    5.  For social work questions or needs; Please call Dickie La RN, MSW, MPH at [phone]    Information about Coronavirus Prevention for Patients with Sickle Cell Disease.    What you can do to protect yourself???    Avoid close contact with people who are sick.  No handshakes or kissing.  No sharing of utensils.  Keep a distance of at least 6 feet apart.  Wash your hands often with soap and water for at least 20 seconds.  Cover your coughs and sneezes with a tissue and then throw the tissue in the trash.  Avoid touching your eyes, nose and mouth.  Clean and disinfect frequently touched objects and surfaces.    What we can do to help you???    Avoid going directly to the ED for pain or minor complaints  The ED is very crowded and you may be exposed to the virus and/or your care may be delayed.  We may be able to treat your pain in clinic instead of the ED, on a case-by-case basis. Call us at [facility] at [phone], 8AM - 5PM, to discuss    Stay home when you are sick and call us for additional support.    1. If you are sick, and need an ambulance, CALL 911.     2. During regular office hours, Monday to Friday (8am- 5pm), call [facility] at [phone], 8AM - 5PM, and ask for the NURSE TRIAGE LINE and leave a message    3. On nights and weekends, for urgent problems you can call the [phone] and ask for the HEMATOLOGIST ON CALL.    4. If you feel you may have been exposed to COVID-19, please reach out to your primary care provider or the sickle cell team via telephone.  For more information please refer to:    [email or web address]    or the COVID Helpline: [phone].     [Based on a templte from [employer] at [facility] in the [place]]

## 2019-03-21 NOTE — Unmapped (Addendum)
Update:  Seen patient had checked in to an appt with Pulmonary Department. Called them and the will let patient know the appt is today in clinic. They did advise that patient did take a tumble and was currently in the care of the nurses to see if he needs to go to ED. They will notify us if this is the case so we can reschedule the appt.  03/21/2019 @ 10:40am    Tried reaching patient in regard to appt.  Called all three numbers in the system and left  Voice mail on 2 of 3 numbers.  03/21/2019 Called at 8:30am and again at 10:30am

## 2019-03-21 NOTE — Unmapped (Signed)
Pt arrived to the clinic today and was scheduled for PFT's. After pt was called the ladies at the front desk stated that he was startled and as he got up from the chair dropped to the floor. Trula Ore one of the schedulers came and grabbed me. I went out to the lobby to assess and the pt was standing up. Pt was able to walk, no physical trauma noted. Pt was directed to the back and was told to have a seat where he was further assessed. BP was 135/58. O2 saturation was 97% at rest. Pt was able to answer all orientation questions correctly, with a slight delay. When pt was asked if he remembered falling he stated yes and also stated that he was sleepy and had to wake up early in the morning. Pt stated that he was startled as he was getting up and fell on to the floor. MD, Tresa Res was notified, and called to to the clinic. Was able to discuss with RN what happened with the pt, and was also able to speak with the pt to assess. Pt was given ginger ale and crackers. He had stated that he hadn't eaten since early in the morning. After assessment by MD over the phone, MD stated that she felt comfortable with the pt leaving and heading over to his next appointment if he was able to walk around the unit without any issues. Pt was able to ambulate around the unit free of help, and was able to maintain balance, and hold a conversation. Pt had no c/o dizziness, and was able to bend over and touch is toes.     Pt was rescheduled for PFT's the following week and was checked out to head to his next appointment with hematology. RN called and spoke with Shantel over a hematology to give a heads up that the pt was coming over. She stated she would be on the look out. Called at 1:05 to make sure that the pt made to appointment safely, and Shantel informed me that pt was safe and in a room. Mom was called originally due to potentially needing her assistance with driving him, but per MD pt was stable enough after walking to drive himself. Spoke with the mom to let her know what happened, and that her son was okay, and also informed her that the pt made it over to his hematology appointment safely.

## 2019-03-21 NOTE — Unmapped (Signed)
Clinical Pharmacist Practitioner: Sickle Clinic     [name] [name] is a 48 y.o. male with hgb SS disease who I am seeing today for follow-up of voxelotor.    Plan: (per Dr Clarene Duke)  -Stop voxelotor for now. Consider resuming at lower dose of 1g once daily in future  -Continue epoetin 40,000 units Monday and Thursday  -CBC every 2 weeks    [name] [name] verbalized understanding of the plan provided and had no further questions.     F/u: tbd    Approximate face time spent with patient: 20 minutes     [name], PharmD, BCOP, CPP  Clinical Pharmacist Practitioner, Benign Hematology    ___________________________________________________________________    Voxelotor Review  Interim history:  Patient reported following:  -upset stomach on the whole day on Monday 7/20,  was not able to keep food down, but recovered on Monday evening, required one dose of promethazine  -feeling tired, but still able to do adl  -no allergy symptoms  -no other new symptoms   -last epoetin 20,000 units dose was on Monday 7/27.   -no missed doses of phenytoin, carbamazepine, voxelotor    Patient reported being very sleepy and tired at the pulmonary clinic this morning, had a nap while waiting, when called, still sleepy, got up slowly, and fell on his elbows, did not hit his head. Patient reported no dizziness, and was able to leave the pulmonary clinic to return to the hematology clinic. The was not done due to the fall.    Medication access: [facility], copay $0    Adherence:   Patient reported missing 0 doses since starting the voxelotor  Patient has a pillbox yes    Drug-drug interaction  New medications: no   Medications discontinued: no  Potential drug, herb or dietary supplement interactions no    Side Effects     Headache: no     Diarrhea: no     Nausea yes Emesis yes , last Monday 7/20, woke up on Sunday night, unable to et on Monday, resolved by Monday evening  -frequency once on Sunday 7/12, once on Monday 7/20    -required medications yes, What medication? promethazine 12.5mg  x one dose  -required ED visit no  -requried hospital admission no     Others none      Pain  Last sickle crisis: may 2020  number of sickle crisis since last visit: 0  number of ED visits since last visit 0  number of hospital admissions since last visit 0             TEST INTERVAL Baseline Wk 0 ??Wk 1 Wk 3   Voxelotor start date: wed 02/28/19             02/21/19     7/10 ??    7/15     7/29   Insurance approval ?? Yes, copay $0 ?? ?? ??    Structured ROS (AE: diarrhea, headache, adherence, drug interactions) Every visit Diarrhea ?? no ??no no     Nausea  no yes yes     Headache ?? no ??no no     Missed doses ?? no ??no no     Drug interactions ?? yes ??no no   Contraception Every visit ?? yes ??yes yes   Baseline and every 6months ?? pending ??7/30 pending   CBCD/ retic  Pre, 2 weeks and monthly Wbc 7.0 5.6 ??4.9 4.9     Hgb 5.8 5.6 ??5.6 6.3  Plt 212 206 ??167 181     retic 187 134 ??173 170   CMP  Baseline and monthly  Scr 0.63 0.83 0.93 0.85     Tbili 5.3 4.1 ??4.5 5.4     Dbili 1.7 1.9 2.4 2.6     AST 146 119 ??124 130     ALT 29 23 ??24 24   LDH Baseline and monthly 4516 4422 ??4183 4460   BNP Baseline and monthly 870 (4/20) ?? 790?? -   Promise Fatigue SF (7 items) Baseline and every 2 months  33 ?? ?? -   ASCQ-Me Pain impact SF Baseline and every 2 months  ??  25 -   ASCQ-ME Pain Interference SF Baseline and every 2 months                40.7 ?? ?? -   Number of transfused RBC units OR  Interval of RBCV exchange transfusion Monthly                 none ??none none?? none

## 2019-03-21 NOTE — Unmapped (Signed)
[facility] Follow-up Note      Primary Care Physician:   [name] Maudlin, MD    Reason for Visit:  Follow up sickle cell anemia, Hgb SS    Assessment/Recommendations:   [name] [name] is a 48 y.o. year old African-American male who returns for follow-up of sickle cell anemia.      1.  Sickle cell anemia, Hgb SS: severe anemia with macrocytosis. Not on hydrea through choice and borderline baseline neutrophils. Started on Voxelator 1500 mg daily in the context of his hyperhemolysis (1st dose on 02/28/19).  Remains on procrit, which was increased to 40K units twice weekly. His labs from today show slight improvement in hemoglobin, although his LDH and bilirubin are slightly elevated compared to prior.  Pt is also endorsing significant nausea/vomiting with voxelator, feels he is taking too many medications, and also expressed concern for interaction with epilepsy medications.  Therefore, he requested to discontinue, which we agreed to.  We did recommend continuing with EPO at current dose, which pt agreed to continue. Continue with folate.  - Will obtain repeat labs in 2 weeks with plan for standing Q2 week labs for close monitoring.  His rising bilirubin and LDH are certainly concerning especially in light of discontinuing voxelator.  - Pt was very sleepy/lethargic in clinic today and had a fall earlier in the day as detailed below.  This is similar to prior presentations and is very concerning especially as pt drives himself to appointments.  Therefore, we will hold off on scheduling in clinic follow-up at this time and will likely plan for virtual follow-up.  Will also try to arrange for future labs via home nursing for the same reasons.  2. Pulmonary HTN / Hypoxia: He is not wearing oxygen consistently at night, as he reports the noise of the condenser makes it difficult to sleep.  We have previously advised moving the condenser out of his room to try and improve compliance, but unclear if he has tried this.  Most recent outpatient echo 09/25/2018 TRV 3.1, LA dilatation. Ejection fraction 60-65%, PASP 53.  - Continue oxygen as prescribed. Currently prescribed 2-4L at night. Again reported not using regularly because of the noise from the condenser.  - Was scheduled for PFTs test this morning, but unfortunately had a fall in the office, so test was cancelled.  We have previously discussed referral to pulmonary; however, given above safety concerns regarding travel to appointments we will hold off on referral at this time.  3. Proteinuria with intermittent hyperkalemia and low blood pressure in the past: Patient not a candidate for ACE inhibitor or ARB at present.   Elevated PTH suggests CKD.  Missed recent appt with Dr. Tammi Klippel in nephrology on 03/13/19.  Fortunately his potassium is wnl's today and his renal function is stable.  Will continue to follow with Q2 week labs.  4. Health maintenance: Hep B sAb neg in past (never immunized), last PCV23 was 2010. Untransfusable, so may not be a priority at present.  Will also need to start Twinrix series in near future.  5. Seizures, last time 3-4 years ago, still on Tegretol and Dilantin. Denied recent seizure activity.  6. Erectile dysfunction: Previously endorsed issues with erectile dysfunction, but denied on assessment today.  Also denied episodes of priapism.     Headaches No  Visual in the past, but denied symptoms today  Hearing in the past, occasionally ringing in ears, did not review on assessment today  Dental  health OK, did not exam today given COVID precautions  Fever/cough: no fever, no cough, or other URI symptoms  Ports in place? No  Constipation in the past, None (endorsing regular BMs)  Palpitations in the past, None endorsed today  SOB/DOE: with limited exertion, denied SOB while at rest  Pedal edema: resolved  Leg ulcers: None (although full LEs were not examined today)  Recent ED visits or hospitalizations: Yes (pt was sent to urgent care for evaluation of dizziness on 7/15, while there he had a fall vs syncopal episode, then sent to the ED for further evaluation, orthostatics were negative and ECG showed NSR and he was discharged home).   Transfusion review: as noted below  Doesn't sleep well, has supplement O2 at night, has not been using recently as his humidifier/condenser keeps him up, the importance of adherence has been previously discussed.  Priapism: in the past.  None recently, likely related to ED.  On assessment today, pt reported improvement in his ED, denied priapism.  Painful scrotal rash at time of last visit on 7/10. Was prescribed mycolog, with improvement in symptoms.  Pain and pain management, night-time sxs: in the past, denied recent pain crises.    FOLLOW UP: will have labs checked every 2 weeks.  Will likely need virtual follow-up in the future due to above safety concerns regarding traveling to inpatient clinic visits.    History of Present Illness:   [name] [name] is a 48 y.o. year old African-American male last actually seen in clinic on March 02 2019.    To review, when he was seen in clinic on 12/06/2018, he presented with symptomatic anemia. He was found to be hypoxemic (86% on room air)  with a hgb of 5.4.  He was sent ED and admitted for further evaluation and treatment. He was started on epo while hospitalized.  In addition, he had some weakness, diplopia, vertigo while admitted with unclear etiology. He had MRI brain that was negative for any acute changes.    He was last seen in clinic on 03/02/19, and had non-transfused Hgb of 5.6, macrocytosis off Hydrea, on BIW EPO at home. He had recently started on voxelator 2 days prior. Denied fevers, cough, or SOB. Reported pain in his groin and scrotum x 2-3 days.  Was sent to urgent care for evaluation of scrotal rash and was started on mycolog with improvement in symptoms; groin was not examined today.    On 7/15 was sent to urgent care for evaluation of dizziness, while there he had an unwitnessed fall vs syncopal episode, then sent to the ED for further evaluation, orthostatics were negative and ECG showed NSR and he was discharged home.  No head imaging obtained.    On assessment today, pt endorsed significant nausea/vomiting since starting voxelator and requested to discontinue.  He was also very sleepy/lethargic.  He was actually seen earlier in the day in Pulmonology for PFTs, where he was also noted to be sleepy and actually had a fall in their waiting room.  Pt reported being startled when called by the front desk staff and then upon standing fell to the floor, reportedly falling forward onto his elbows.  Pt was then able to stand on his own, denied loss of consciousness and/or head trauma and on assessment was reportedly oriented (please see full note from Jonna Munro, RN for full details).  Pt endorsed the above history in clinic, although was not able to fully explain why he fell with the  exception of stating he felt tired and had not slept well the night before.  He repeatedly denied feeling lightheaded/dizzy.  He also endorsed intermittent right sided chest pain, although was not present at time of assessment in clinic, and pain in the arch of his right foot.  He denied recent fevers/chills, URI symptoms, coughing, SOB, abdominal pain, diarrhea/constipation.  He also denied lightheadedness/dizziness.    Orthostatics were checked while in clinic and were negative.    Due to pt's lethargy we allowed pt to sleep for about an hour in clinic and then upon reassessment appeared more alert and stable on his feet.    Sickle Cell Disease History:  Access: peripheral IV  1.  Genotype: SS (hemolysis: hemoglobin 6 , ARC 100-200 , LDH 3000-5000 , Bilirubin 3-5 , urobilinogen 0.2-2 )  Concomitant alpha thal: ?  Other genetic studies: no  BMT?:  no  Folic acid: yes  Hydroxyurea: was on 500mg /day but with mild neutropenia (1200) and minimal F elevation; patient with h/o telling different stories to different providers - in setting of neutropenia and renal dysfunction and minimal improvement in %F, we have not resumed Hydrea.  Started on Voxelator on 7/8 for hyperhemolysis, but discontinued today per pt request secondary to nausea/vomiting.  Studies/protocols: no   2. Pain (typical location): rare - legs, feet  Hospitalization frequency:  rare  Outpatient regimen: tylenol  24h Morphine Equivalent Dose (Goal <100): 0  Bowel regimen: none  Inpatient regimen: ?   2. Transfusions: yes per previous notes but on 11/11/17 patient said 'never'  Life time blood transfusions: 6+  Last transfusion: 02/2018   History of DHTR?: unlikely  Alloantibodies: no  Iron status: ferritin 259 (11/11/17)  Medications: no   3.  CV/Pulm: chronic hypoxia ( improved on HU per previous notes), supposed to be on o2 at night (2LPM); on 11/11/17 states he can 'walk 1/2 mile without getting short of breath'. Unable to complete PFTs on 03/21/19 due to in office fall.  Acute chest syndrome: no  Need for mechanical ventilation?: no  Pulmonary hypertension: yes, h/o hypoxia - NYHA 2 and ? WHO group 5; RHC 06/2017 - mild PxHTN with PAmean = 26, PCW mean of 16 and increased CO, normal PVR 1.2 wood units      4. Neuro:  Stroke/TIA: no  Imaging:     Other: epilepsy diagnosed at 48yo, on carbamazepine and phenytoin; follows regularly with neurology   5. Psych: cognitive delay  Concerns for depression/anxiety?: no  Concerns for substance dependence?: no  Medications:    6. ID/Immune:  Splenectomy?: no  Antibiotics: no  Immunization history:   Immunization History   Administered Date(s) Administered   ??? Hepatitis B, Adult 03/25/2009, 04/24/2009   ??? INFLUENZA TIV (TRI) PF (IM) 07/21/2009, 09/12/2012   ??? Influenza Vaccine Quad (IIV4 PF) 24mo+ injectable 07/04/2015, 05/07/2016, 04/20/2018   ??? Influenza Virus Vaccine, unspecified formulation 10/11/2018   ??? Meningococcal C Conjugate 03/27/2013   ??? PNEUMOCOCCAL POLYSACCHARIDE 23 03/25/2009, 11/11/2017   ??? Pneumococcal Conjugate 13-Valent 07/04/2015     ANA: positive 1:160 on 06/2017  HIV neg 06/2017  Hep BsAb neg 04/2016   7. GI/Hep:  Cholecystectomy?: no  Hepatopathy?: ? - enlarged on u/s 01/2016  Other: no   8. 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 GU:  Priapism?: yes, remote - last at 48yo per notes   10. M/sk:  AVN?: no  Ulcers?: no  Other: left iliac crest fracture (? 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 Endocrine:  Bone/vitamin D: Will need to re-address at time of next clinic visit  Other:    13. Screening:   Ophtho/Retinopathy?: neg 09/2017  Audiology?: no  Dental?: yes   14. Social: watches TV all day, lives alone, drives  Education: high school   Work/Disability:   Smoking: cigars 2x/day  Alcohol:   Other:      Surgical History:  Past Surgical History:   Procedure Laterality Date   ??? PR RIGHT HEART CATH O2 SATURATION & CARDIAC OUTPUT N/A 07/18/2017    Procedure: Right Heart Catheterization;  Surgeon: Zannie Cove, MD;  [facility];  Service: Cardiology     Medications:   Current Outpatient Medications   Medication Sig Dispense Refill   ??? carBAMazepine (TEGRETOL  XR) 400 MG 12 hr tablet Take 2 tablets (800 mg total) by mouth Two (2) times a day. 120 tablet 11   ??? cholecalciferol, vitamin D3, (VITAMIN D3) 10 mcg (400 unit) cap Take 1 capsule (400 Units total) by mouth daily. 30 each 0   ??? diclofenac sodium (VOLTAREN) 1 % gel Apply 2 g topically daily as needed for arthritis.     ??? empty container (SHARPS CONTAINER) Misc use as directed 1 each 2   ??? epoetin alfa (EPOGEN,PROCRIT) 40,000 unit/mL injection Inject 1 mL (40,000 Units total) under the skin Two (2) times a week. 8 mL 3   ??? folic acid (FOLVITE) 1 MG tablet Take 1 tablet (1 mg total) by mouth daily. 30 tablet 11   ??? nystatin-triamcinolone (MYCOLOG) 100,000-0.1 unit/gram-% ointment Apply topically Two (2) times a day. 30 g 0   ??? OXYGEN-AIR DELIVERY SYSTEMS MISC 2 L/min by Each Nare route nightly. [employer]     ??? phenytoin (DILANTIN) 100 MG ER capsule Take 2 capsules (200 mg total) by mouth Two (2) times a day. 120 capsule 11   ??? promethazine (PHENERGAN) 12.5 MG tablet Take 12.5 mg by mouth every six (6) hours as needed for nausea.     ??? sodium polystyrene (KAYEXALATE) powder Take 15 g by mouth continuous as needed (with high potassium food (e.g. banana)).     ??? syringe with needle (BD TUBERCULIN SYRINGE) 1 mL 25 gauge x 5/8 Syrg 1 each by Miscellaneous route Every Tuesday and Friday. 8 each 2   ??? voxelotor (OXBRYTA) 500 mg tablet Take 3 tablets (1,500 mg total) by mouth daily. 90 tablet 2     No current facility-administered medications for this visit.      Allergies:  Patient has no known allergies.    Social History:  Social History     Socioeconomic History   ??? Marital status: Single     Spouse name: None   ??? Number of children: 0   ??? Years of education: None   ??? Highest education level: None   Occupational History   ??? Occupation: disability   Social Needs   ??? Financial resource strain: None   ??? Food insecurity     Worry: None     Inability: None   ??? Transportation needs     Medical: None     Non-medical: None   Tobacco Use   ??? Smoking status: Current Some Day  Smoker     Packs/day: 1.00     Years: 0.50     Pack years: 0.50     Types: Cigars   ??? Smokeless tobacco: Never Used   ??? Tobacco comment: Pt smokes 1-2 cigars daily, Pt intends to cease tobacco use    Substance and Sexual Activity   ??? Alcohol use: Not Currently     Alcohol/week: 0.0 standard drinks     Drinks per session: 1 or 2     Binge frequency: Never     Comment: occasional   ??? Drug use: No   ??? Sexual activity: None   Lifestyle   ??? Physical activity     Days per week: None     Minutes per session: None   ??? Stress: None   Relationships   ??? Social Wellsite geologist on phone: None     Gets together: None     Attends religious service: None     Active member of club or organization: None     Attends meetings of clubs or organizations: None     Relationship status: None   Other Topics Concern   ??? None   Social History Narrative   ??? None     Family History:  family history includes Clotting disorder in his paternal grandmother; Hypertension in his father and mother; No Known Problems in his brother, brother, brother, brother, maternal aunt, maternal grandfather, maternal grandmother, maternal uncle, paternal aunt, paternal grandfather, paternal uncle, sister, sister, and another family member; Sickle cell anemia in his sister.   He indicated that his mother is alive. He indicated that his father is alive. He indicated that two of his three sisters are alive. He indicated that all of his four brothers are alive. He indicated that the status of his maternal grandmother is unknown. He indicated that the status of his maternal grandfather is unknown. He indicated that the status of his paternal grandmother is unknown. He indicated that the status of his paternal grandfather is unknown. He indicated that the status of his maternal aunt is unknown. He indicated that the status of his maternal uncle is unknown. He indicated that the status of his paternal aunt is unknown. He indicated that the status of his paternal uncle is unknown. He indicated that the status of his neg hx is unknown. He indicated that the status of his other is unknown.    Review of Systems:  As per HPI, otherwise negative x 10 systems.    Objective :   Vitals:    03/21/19 1138   BP: 98/55   Pulse: 62   Temp: 36.8 ??C (98.3 ??F)   SpO2: 98%   Weight: 63 kg (138 lb 14.4 oz)   Height: 172.7 cm (5' 8)     Physical Exam:  Gen: Lethargic, although easily arousable  Eyes: Marked icterus  CV: RRR, holosystolic murmur   Pulm: Lungs clear to auscultation in all fields.  GI - abdomen nondistended, soft, non-tender  Skin - no lesions noted on visible skin  Ext: Clubbing, especially index fingers.  Neuro - Alert and oriented to conversation, no facial droop, speech fluent and coherent, moves all extremities without asymmetry, slowed but steady gait with transition from chair to exam table  Psych - Normal affect  MSK - no joint swelling or effusions noted    Previous Pain Impact Short form, 25 (all answers were 'never').    Test Results:    Lab Results  Component Value Date    WBC 4.9 03/21/2019    HGB 6.3 (LL) 03/21/2019    HCT 19.2 (LL) 03/21/2019    PLT 181 03/21/2019       Lab Results   Component Value Date    NA 138 03/21/2019    K 4.9 03/21/2019    CL 109 (H) 03/21/2019    CO2 23.0 03/21/2019    BUN 16 03/21/2019    CREATININE 0.85 03/21/2019    GLU 128 03/21/2019    CALCIUM 8.6 03/21/2019    MG 1.8 12/06/2018    PHOS 4.4 12/06/2018       Lab Results   Component Value Date    BILITOT 5.4 (H) 03/21/2019    BILIDIR 2.60 (H) 03/21/2019    PROT 8.2 03/21/2019    ALBUMIN 3.9 03/21/2019    ALT 24 03/21/2019    AST 130 (H) 03/21/2019    ALKPHOS 252 (H) 03/21/2019    GGT 412 (H) 12/11/2018       Lab Results   Component Value Date    INR 1.21 12/13/2018    APTT 30.3 03/02/2018     Pt was seen and discussed with Dr. Clarene Duke.    Doree Barthel, MD  Hematology/Oncology Fellow

## 2019-03-21 NOTE — Unmapped (Signed)
Patient came to clinic for scheduled appointment. CMA roomed patient and began vitals. Upon taking vitals, noticed bandage on right arm. When asked, patient stated that he had an accident the other day.

## 2019-03-22 LAB — THYROID STIMULATING HORMONE: Thyrotropin:ACnc:Pt:Ser/Plas:Qn:: 3.1

## 2019-03-23 LAB — GLUCOSE-6-PHOSPHATE DEHYDROGENASE QUAL: Lab: 14 — ABNORMAL HIGH

## 2019-03-24 LAB — FRUCTOSAMINE: Fructosamine:SCnc:Pt:Ser/Plas:Qn:: 320 — ABNORMAL HIGH

## 2019-03-29 ENCOUNTER — Ambulatory Visit: Admit: 2019-03-29 | Discharge: 2019-03-30 | Payer: MEDICARE

## 2019-03-29 DIAGNOSIS — R0600 Dyspnea, unspecified: Principal | ICD-10-CM

## 2019-03-29 NOTE — Unmapped (Signed)
[facility] Refill Coordination Note    Specialty Medication(s) to be Shipped:   Hematology/Oncology: Procrit    Other medication(s) to be shipped: syringe with needle     [name] [name], DOB: Nov 17, 1970  Phone: [phone] (home)       All above HIPAA information was verified with patient.     Completed refill call assessment today to schedule patient's medication shipment from the [facility] [phone]).       Specialty medication(s) and dose(s) confirmed: Regimen is correct and unchanged.   Changes to medications: [name] [name] Reports stopping the following medications: Voxelotor 500 mg tablets  Changes to insurance: No  Questions for the pharmacist: No    Confirmed patient received Conservation officer, historic buildings with first shipment. The patient will receive a drug information handout for each medication shipped and additional FDA Medication Guides as required.       DISEASE/MEDICATION-SPECIFIC INFORMATION        For patients on injectable medications: Patient currently has 7 doses left.  Next injection is scheduled for na.    SPECIALTY MEDICATION ADHERENCE     Medication Adherence    Patient reported X missed doses in the last month: 0  Specialty Medication: procrit 40,000 unit/ml  Patient is on additional specialty medications: No  Additional Specialty Medications: voxelotor 500mg   Patient is on more than two specialty medications: No  Any gaps in refill history greater than 2 weeks in the last 3 months: no  Demonstrates understanding of importance of adherence: yes  Informant: patient  Reliability of informant: reliable  Confirmed plan for next specialty medication refill: delivery by pharmacy  Refills needed for supportive medications: not needed              procrit 40,000 unit/ml. 7 doses on hand      SHIPPING     Shipping address confirmed in Epic.     Delivery Scheduled: Yes, Expected medication delivery date: 082020.     Medication will be delivered via Same Day Courier to the home address in Epic WAM.    [name] [name]   [name]

## 2019-04-02 NOTE — Unmapped (Signed)
It took until now for me to reach the patient by phone to discuss his recent gait issue during a clinic. ??He said he was sleepy at the recent clinic visit and attributes his gait issues at the time to such. ??Of note, several years back he had a similar issue of ataxia in my clinic of somewhat unclear etiology, which he also attributed to being sleepy that day, and was not present on follow-up. ??Recent labs were reviewed.  He denies any gait issues at present. ??He does not have capability for a video visit. ??My immediate upcoming in person clinic slots are full, but if needed, I would find some time to see him directly in person. ??At present, since he is reporting no gait issues, I asked him to call me directly if he has any further problems in that regard.

## 2019-04-03 NOTE — Unmapped (Signed)
Clinical Pharmacist Practitioner: Sickle Clinic     [name] is a 48 y.o. male with hgb SS disease who I called today for follow-up of epoetin and lab required post discontinuation of voxelotor.    Plan: (per Dr Clarene Duke)  -Continue epoetin 40,000 units Monday and Thursday  -CBC tomorrow at [facility]    [name] verbalized understanding of the plan provided, and via teachback and had no further questions.     ___________________________________________________________________    Interim history:  Patient reported following:  -feeling tired periodically, but not as bad as while on voxelotor  -no other new symptoms, no more waking up at night with nausea   -last epoetin 40,000 units dose was on Monday 8/10    Medication access: [facility], copay $0    Adherence:   Patient reported missing 0 doses of epoetin since last clinic visit on 7/29  Patient has a pillbox yes    Approximate face time spent with patient: 16 minutes     [name] [name], PharmD, BCOP, CPP  Clinical Pharmacist Practitioner, Benign Hematology               TEST INTERVAL Baseline Wk 0 ??Wk 1 Wk 3   Voxelotor start date: wed 02/28/19, stopped 7/29             02/21/19     7/10 ??    7/15     7/29   Insurance approval ?? Yes, copay $0 ?? ?? ??    Structured ROS (AE: diarrhea, headache, adherence, drug interactions) Every visit Diarrhea ?? no ??no no     Nausea  no yes yes     Headache ?? no ??no no     Missed doses ?? no ??no no     Drug interactions ?? yes ??no no   Contraception Every visit ?? yes ??yes yes   Baseline and every 6months ?? pending ??7/30 pending   CBCD/ retic  Pre, 2 weeks and monthly Wbc 7.0 5.6 ??4.9 4.9     Hgb 5.8 5.6 ??5.6 6.3     Plt 212 206 ??167 181     retic 187 134 ??173 170   CMP  Baseline and monthly  Scr 0.63 0.83 0.93 0.85     Tbili 5.3 4.1 ??4.5 5.4     Dbili 1.7 1.9 2.4 2.6     AST 146 119 ??124 130     ALT 29 23 ??24 24   LDH Baseline and monthly 4516 4422 ??4183 4460   BNP Baseline and monthly 870 (4/20) ?? 790?? -   Promise Fatigue SF (7 items) Baseline and every 2 months  33 ?? ?? -   ASCQ-Me Pain impact SF Baseline and every 2 months  ??  25 -   ASCQ-ME Pain Interference SF Baseline and every 2 months                40.7 ?? ?? -   Number of transfused RBC units OR  Interval of RBCV exchange transfusion Monthly                 none ??none none?? none

## 2019-04-06 NOTE — Unmapped (Addendum)
Per [facility] - Pt has not yet come in for draw. Last draw done there ws Aug 2017.    VM left reminding pt labs are still pending from [facility]. Informed pt we've not received results yest and to please either have them drawn or call and let us know that they've already been drawn so we can f/u on them.

## 2019-04-09 NOTE — Unmapped (Addendum)
Labs refaxed to [facility] via [facility] (see below). Verified fax received via T/C w/staffmember.  VM left for pt confirming orders received and asked that he have them drawn as soon as he can.

## 2019-04-09 NOTE — Unmapped (Signed)
[name] [name] called clinic to report that he went to get abs done at [facility] and they did not have orders. Requested from patient to let us know location and fax number where to fax orders.   Lanora Manis, do you know the fax to [facility]?

## 2019-04-12 MED FILL — BD TUBERCULIN SYRINGE 1 ML 25 GAUGE X 5/8": 28 days supply | Qty: 8 | Fill #1

## 2019-04-12 MED FILL — BD TUBERCULIN SYRINGE 1 ML 25 GAUGE X 5/8": 28 days supply | Qty: 8 | Fill #1 | Status: AC

## 2019-04-12 MED FILL — PROCRIT 40,000 UNIT/ML INJECTION SOLUTION: 28 days supply | Qty: 8 | Fill #1 | Status: AC

## 2019-04-12 MED FILL — PROCRIT 40,000 UNIT/ML INJECTION SOLUTION: SUBCUTANEOUS | 28 days supply | Qty: 8 | Fill #1

## 2019-04-18 NOTE — Unmapped (Signed)
Left another VM reminding pt lab orders have been faxed to [facility] for draw and to please go at his earliest convenience.

## 2019-04-20 NOTE — Unmapped (Addendum)
Spoke with pt earlier this evening, after a couple unsuccessful prior attempts to reach him.  Pt reports having a new home phone number (which has been updated in Epic), which perhaps explains some of the trouble in reaching him on prior attempts.    Pt reports that he has developed recurrent episodes of vomiting and associated stomach pain, similar to the symptoms he was experiencing on voxelotor (which was discontinued last month). ??He believes these symptoms are related to EPO and therefore did not take his dose of EPO today. ??It was hard for the pt describe how often he experiences these episodes. ??At the time of our conversation he reported the stomach pain and nausea had resolved. ??He also expressed concern about remaining on EPO because of his symptoms. ??I advised that he continue to take as prescribed until he can discuss this with Dr. Clarene Duke or another provider [facility]. ??Of note, he was able to tell me that he has been taking EPO on a Monday/Thursday schedule, but could not tell me his exact dose. ??Unfortunately, he was not at home at the time of our conversation, so he was not able to check his dose or the size of his syringes. ??I mentioned that we would try to call him again at home to try and clarify his EPO dose.    He also reports that his weakness, which has been an ongoing problem, has been improving a little and denies falls. ??He is only using his supplemental O2 about 3 nights per week.  I encouraged him to use his O2 every night and discussed the importance of trying to prevent hypoxia while sleeping.    Lastly, he reported having labs drawn about 3 days ago at [facility] and he was asking about the results of his Hgb.  Unfortunately we have not yet received these results, will need to reach out to the [facility] in the morning.    Doree Barthel, MD  Hematology/Oncology Fellow

## 2019-04-20 NOTE — Unmapped (Signed)
[facility] is closed on Fridays. Will have to call back Monday to try and retreive results.

## 2019-04-21 ENCOUNTER — Emergency Department
Admit: 2019-04-21 | Discharge: 2019-04-21 | Disposition: A | Discharge status: Home / Self Care | Payer: MEDICARE | Attending: Emergency Medicine

## 2019-04-21 ENCOUNTER — Ambulatory Visit
Admit: 2019-04-21 | Discharge: 2019-04-21 | Disposition: A | Discharge status: Home / Self Care | Payer: MEDICARE | Attending: Emergency Medicine

## 2019-04-21 DIAGNOSIS — D5700 Hb-SS disease with crisis, unspecified: Principal | ICD-10-CM

## 2019-04-21 LAB — CBC W/ AUTO DIFF
HEMATOCRIT: 17.9 % — ABNORMAL LOW (ref 41.0–53.0)
HEMOGLOBIN: 6 g/dL — ABNORMAL LOW (ref 13.5–17.5)
MEAN CORPUSCULAR HEMOGLOBIN CONC: 33.9 g/dL (ref 31.0–37.0)
MEAN CORPUSCULAR VOLUME: 113 fL — ABNORMAL HIGH (ref 80.0–100.0)
MEAN PLATELET VOLUME: 8.8 fL (ref 7.0–10.0)
NUCLEATED RED BLOOD CELLS: 39 /100{WBCs} — ABNORMAL HIGH (ref <=4)
PLATELET COUNT: 154 10*9/L (ref 150–440)
RED BLOOD CELL COUNT: 1.58 10*12/L — ABNORMAL LOW (ref 4.50–5.90)

## 2019-04-21 LAB — COMPREHENSIVE METABOLIC PANEL
ALBUMIN: 3.8 g/dL (ref 3.5–5.0)
ALKALINE PHOSPHATASE: 332 U/L — ABNORMAL HIGH (ref 38–126)
ALT (SGPT): 24 U/L (ref <50)
ANION GAP: 3 mmol/L — ABNORMAL LOW (ref 7–15)
AST (SGOT): 108 U/L — ABNORMAL HIGH (ref 19–55)
BILIRUBIN TOTAL: 5.9 mg/dL — ABNORMAL HIGH (ref 0.0–1.2)
BLOOD UREA NITROGEN: 17 mg/dL (ref 7–21)
BUN / CREAT RATIO: 21
CALCIUM: 9.1 mg/dL (ref 8.5–10.2)
CHLORIDE: 111 mmol/L — ABNORMAL HIGH (ref 98–107)
CO2: 25 mmol/L (ref 22.0–30.0)
CREATININE: 0.81 mg/dL (ref 0.70–1.30)
EGFR CKD-EPI AA MALE: >90 mL/min/{1.73_m2} (ref >=60)
EGFR CKD-EPI NON-AA MALE: >90 mL/min/{1.73_m2} (ref >=60)
GLUCOSE RANDOM: 89 mg/dL (ref 70–179)
PROTEIN TOTAL: 8 g/dL (ref 6.5–8.3)

## 2019-04-21 LAB — MANUAL DIFFERENTIAL
BASOPHILS - ABS (DIFF): 0 10*9/L (ref 0.0–0.1)
EOSINOPHILS - ABS (DIFF): 0 10*9/L (ref 0.0–0.4)
EOSINOPHILS - REL (DIFF): 0 %
LYMPHOCYTES - ABS (DIFF): 2.6 10*9/L (ref 1.5–5.0)
LYMPHOCYTES - REL (DIFF): 38 %
MONOCYTES - ABS (DIFF): 1.4 10*9/L — ABNORMAL HIGH (ref 0.2–0.8)
MONOCYTES - REL (DIFF): 21 %
NEUTROPHILS - ABS (DIFF): 2.8 10*9/L (ref 2.0–7.5)
NEUTROPHILS - REL (DIFF): 41 %

## 2019-04-21 LAB — EGFR CKD-EPI NON-AA MALE: Lab: >90

## 2019-04-21 LAB — LACTATE DEHYDROGENASE: Lactate dehydrogenase:CCnc:Pt:Ser/Plas:Qn:: 3771 — ABNORMAL HIGH

## 2019-04-21 LAB — SMEAR REVIEW

## 2019-04-21 LAB — RETIC COUNT, MANUAL: Lab: 13.9 — ABNORMAL HIGH

## 2019-04-21 LAB — HEMATOCRIT: Hematocrit:VFr:Pt:Bld:Qn:: 17.9 — ABNORMAL LOW

## 2019-04-21 MED ORDER — OXYCODONE 5 MG TABLET
ORAL_TABLET | Freq: Three times a day (TID) | ORAL | 0 refills | 2.00000 days | Status: CP | PRN
Start: 2019-04-21 — End: ?

## 2019-04-21 NOTE — Unmapped (Signed)
[facility] Emergency Department Provider Note    Patient Identification  [name] [name]  Patient information was obtained from patient.  History/Exam limitations: none.    ED Clinical Impression     Final diagnoses:   Acute pain of left lower extremity (Primary)       Initial Impression, ED Course, Assessment and Plan     Impression: 48 y.o. male with a PMH of HTN, sickle cell anemia, sickle cell anemia, seizure disorder on carbamazepine/phenytoin presenting with a sickle cell pain crisis.     Triage VS are WNL. On exam, patient is chronically ill, non toxic. Diffuse  mild tenderness to palpation over LLE distal to the knee. No soft tissue swelling skin changes or deformities. Palpable pulses. Intact sensation. Limited ROM at ankle 2/2 pain. Exam otherwise unremarkable. No CP, SOB, fevers.     Differential includes sickle cell pain crisis vs fracture vs. Avascular necrosis, among others. Patient overall well appearing.     Plan for basic labs, LDH and Reticulocytes. Will obtain EKG, XR TibFib, and XR foot. Will give tylenol and oxycodone for pain management.     Labs with a normal WBC count at 6.8, Hgb 6.0 (at baseline, patient no transfusable per heme/onc notes). Electrolytes and renal function WNLs. AST elevated (at baseline for pt). LDH elevated at 3,771 (baseline to slightly lower than baseline for patient). EKG with sinus bradycardia at 53, but otherwise reassuring. XR negative for bony abnormalities.     9:36 AM  Patient reassessed and feeling a bit improved in terms of pain. Given chronically poor labs and patient with complicated SCD on EPO, will page benign heme/onc fellow to discuss case.     12:29 PM  I discussed case with hematology fellow who states that patient okay to go as long as VSS and exam reassuring. They will follow-up with him early next week. Patient with stable exam and VS. Will discharge with 5 tabs oxycodone and return and narcotic precautions discussed and patient in agreement with plan and expresses understanding.     Disposition: discharge    Additional Medical Decision Making     I independently visualized the EKG tracing.   I independently visualized the radiology images.   I reviewed the patient's prior medical records.   I discussed the case with the benign heme/onc fellow.     Any labs and radiology results that were available during my care of the patient were independently reviewed by me and considered in my medical decision making.    Portions of this record have been created using Scientist, clinical (histocompatibility and immunogenetics). Dictation errors have been sought, but may not have been identified and corrected.  ____________________________________________     Patient History     Chief Complaint  Sickle Cell Pain Crisis      HPI   [name] [name] is a 48 y.o. male with a PMH of HTN, sickle cell anemia, and seizure disorder on carbamazepine/phenytoin presenting with a sickle cell pain crisis. Pt reports 2 days of LLE pain extending from his ankle to his knee. He reports history of abdominal pain and penile pain with his previous pain crises. He denies any recent falls or trauma to the area.  He has been unable to walk 2/2 his pain but has been able to mildly rotate his ankle. Pt has taken Tylenol for his pain with minimal relief. He also endorses nausea and emesis but thinks his symptoms may be attributed to his EPO shots. Patient reports that  he gets EPO shots at baseline for low Hgb, last shot about ~12 days ago. No hx of DVT or PE. Pt denies allergies to pain medications or antibiotics. He denies fever, shortness of breath, or cough.      Past Medical History:   Diagnosis Date   ??? Anemia    ??? Foot pain    ??? 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 Hypertension    ??? Pulmonary hypertension (CMS-HCC)    ??? Seizures (CMS-HCC)    ??? Sickle cell anemia (CMS-HCC)    ??? Vitamin D deficiency        Patient Active Problem List   Diagnosis   ??? Hyperpotassemia   ??? 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 Sickle cell anemia (CMS-HCC)   ??? Hyperkalemia   ??? Proteinuria   ??? Vitamin D deficiency   ??? Seizure disorder (CMS-HCC)   ??? Pain of left hip joint   ??? Ear infection   ??? Sickle cell crisis (CMS-HCC)   ??? Pulmonary HTN (CMS-HCC)   ??? Anemia   ??? Edema   ??? Hypoxemia   ??? Tobacco use disorder   ??? High output heart failure (CMS-HCC)   ??? Vertigo   ??? Hyperbilirubinemia   ??? Nausea & vomiting       Past Surgical History:   Procedure Laterality Date   ??? PR RIGHT HEART CATH O2 SATURATION & CARDIAC OUTPUT N/A 07/18/2017    Procedure: Right Heart Catheterization;  Surgeon: Zannie Cove, MD;  Location: [facility] CATH;  Service: Cardiology       No current facility-administered medications for this encounter.     Current Outpatient Medications:   ???  carBAMazepine (TEGRETOL  XR) 400 MG 12 hr tablet, Take 2 tablets (800 mg total) by mouth Two (2) times a day., Disp: 120 tablet, Rfl: 11  ???  cholecalciferol, vitamin D3, (VITAMIN D3) 10 mcg (400 unit) cap, Take 1 capsule (400 Units total) by mouth daily., Disp: 30 each, Rfl: 0  ???  diclofenac sodium (VOLTAREN) 1 % gel, Apply 2 g topically daily as needed for arthritis., Disp: , Rfl:   ???  empty container (SHARPS CONTAINER) Misc, use as directed, Disp: 1 each, Rfl: 2  ???  epoetin alfa (EPOGEN,PROCRIT) 40,000 unit/mL injection, Inject 1 mL (40,000 Units total) under the skin Two (2) times a week., Disp: 8 mL, Rfl: 3  ???  folic acid (FOLVITE) 1 MG tablet, Take 1 tablet (1 mg total) by mouth daily., Disp: 30 tablet, Rfl: 11  ???  nystatin-triamcinolone (MYCOLOG) 100,000-0.1 unit/gram-% ointment, Apply topically Two (2) times a day., Disp: 30 g, Rfl: 0  ???  OXYGEN-AIR DELIVERY SYSTEMS MISC, 2 L/min by Each Nare route nightly. [employer], Disp: , Rfl:   ???  phenytoin (DILANTIN) 100 MG ER capsule, Take 2 capsules (200 mg total) by mouth Two (2) times a day., Disp: 120 capsule, Rfl: 11  ???  promethazine (PHENERGAN) 12.5 MG tablet, Take 12.5 mg by mouth every six (6) hours as needed for nausea., Disp: , Rfl:   ???  sodium polystyrene (KAYEXALATE) powder, Take 15 g by mouth continuous as needed (with high potassium food (e.g. banana))., Disp: , Rfl:   ???  syringe with needle (BD TUBERCULIN SYRINGE) 1 mL 25 gauge x 5/8 Syrg, 1 each by Miscellaneous route Every Tuesday and Friday., Disp: 8 each, Rfl: 2  ???  voxelotor (OXBRYTA) 500 mg tablet, Take 3 tablets (1,500 mg total) by mouth daily.,  Disp: 90 tablet, Rfl: 2    Allergies  Patient has no known allergies.    Family History   Problem Relation Age of Onset   ??? Hypertension Mother    ??? Hypertension Father    ??? No Known Problems Sister    ??? No Known Problems Brother    ??? No Known Problems Brother    ??? No Known Problems Brother    ??? No Known Problems Brother    ??? Sickle cell anemia Sister    ??? No Known Problems Sister    ??? Clotting disorder Paternal Grandmother    ??? No Known Problems Maternal Aunt    ??? No Known Problems Maternal Uncle    ??? No Known Problems Paternal Aunt    ??? No Known Problems Paternal Uncle    ??? No Known Problems Maternal Grandmother    ??? No Known Problems Maternal Grandfather    ??? No Known Problems Paternal Grandfather    ??? No Known Problems Other    ??? Seizures Neg Hx    ??? Pulmonary Hypertension Neg Hx    ??? Autoimmune disease Neg Hx    ??? Venous thrombosis Neg Hx    ??? Amblyopia Neg Hx    ??? Blindness Neg Hx    ??? Cancer Neg Hx    ??? Cataracts Neg Hx    ??? Diabetes Neg Hx    ??? Glaucoma Neg Hx    ??? Macular degeneration Neg Hx    ??? Retinal detachment Neg Hx    ??? Strabismus Neg Hx    ??? Stroke Neg Hx    ??? Thyroid disease Neg Hx        Social History  Social History     Tobacco Use   ??? Smoking status: Current Some Day Smoker     Packs/day: 1.00     Years: 0.50     Pack years: 0.50     Types: Cigars   ??? Smokeless tobacco: Never Used   ??? Tobacco comment: Pt smokes 1-2 cigars daily, Pt intends to cease tobacco use    Substance Use Topics   ??? Alcohol use: Not Currently     Alcohol/week: 0.0 standard drinks     Drinks per session: 1 or 2     Binge frequency: Never     Comment: occasional   ??? Drug use: No       Review of Systems  General: no fevers, chills  Cardiac: no CP, SOB, palpitations  Pulmonary: no cough, sputum production, hemoptysis  Musculoskeletal: Positive for lower left leg, foot, and ankle pain  Gastrointestinal: Positive for nausea and emesis  A 10 point review of systems was negative except for those previously mentioned in the HPI and above.    Physical Exam     ED Triage Vitals [04/21/19 0747]   Enc Vitals Group      BP 123/89      Heart Rate 61      SpO2 Pulse       Resp 19      Temp 36.7 ??C (98.1 ??F)      Temp Source Oral      SpO2 95 %     Constitutional: Chronically ill and non-toxic appearing, alert and cooperative in NAD  ENT:       Head: [place]/AT       Eyes: PERRL, conjunctiva clear, sclera anicteric       Mouth/Throat: MM moist  Neck: supple, midline trachea, no tenderness or cervical adenopathy  Cardiac: RRR, normal S1, S2, no appreciable murmurs or rubs. Distal pulses present and symmetrical B/L  Respiratory: CTA bilaterally, non-labored breathing, no stridor, wheezing or crackles  Abdomen: soft, NT, normal BS. No masses, rebound or guarding  Extremities: Diffuse mild TTP over LLE distal to the knee. No soft tissue swelling skin changes or deformities. Limited ROM at left ankle 2/2 pain.  Skin: warm and dry, no rashes or lesions  Neurologic: Sensation intact. no gross focal neurologic deficits appreciated  Psychiatric: normal mood, affect, speech and behavior     EKG     Sinus bradycardia at rate of 53. Narrow QRS, normal QTc. No acute ST or T wave changes consistent with ischemia.     Radiology     XR Foot 3 Or More Views Left   Final Result   -- No acute osseous abnormality of the left foot.   -- Diffuse soft tissue swelling along the plantar aspect of the foot, similar to prior.      XR Tibia Fibula Left   Final Result   -- No acute osseous abnormality of the left tibia/fibula.        I independently visualized these images.    Documentation assistance was provided by Adam Phenix, Scribe, on April 21, 2019 at 7:56 AM for Leatrice Jewels, MD.     April 21, 2019 9:39 AM. Documentation assistance provided by the scribe. I was present during the time the encounter was recorded. The information recorded by the scribe was done at my direction and has been reviewed and validated by me. Neomia Herbel B. Beryl Meager, MD, MS.          Rosalio Macadamia, MD  Resident  04/21/19 1300

## 2019-04-21 NOTE — Unmapped (Signed)
Hem/Onc Phone Triage Note    Caller: Dr. Alton Revere, [facility]    Reason for Call:   [name] [name] is a 48 y.o. 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 He is not currently on Hyrea. He was placed temporarily on Voxelator but this was recently d/c 2/2 intolerance. Pt remains on Procrit 40k units twice weekly. We were called 8/29 by OSH ED provider as this patient presented with pain crisis located in his foot. No fever, chest pain, shortness of breath, VSS. Hb was 6 and LDH was 3700. Provider was requesting guidance in terms of disposition after pain was controlled.    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 Pain was controlled after only Oxy 5mg  x 1. He was afebrile, no chest pain or shortness of breath. VSS. Hb at baseline and LDH elevated but actually improved from last encounter on 7/29. TBili was up marginally from 5.5 to 5.9.     Patient discussed with Dr. Despina Hick who agreed that he was likely safe to discharge as abnormalities on labs represent this patients baseline and pain was easily controlled with oral meds. I will route this note to his primary hematologist for situational awareness and outpatient f/u.      Please page Benign Hematology Fellow at [phone] if patient needs admission or questions about care occur.     Fellow Taking Call:  Jill Alexanders  April 21, 2019 3:23 PM

## 2019-04-21 NOTE — Unmapped (Signed)
Reticulocytes  Order: [id number]  Status:  Final result ????    Sent to provider for review and action

## 2019-04-21 NOTE — Unmapped (Signed)
Pt ambulatory to room c/o LLE and foot pain x 2 days. Pt reports Sickle Cell Pain crisis. Pt reports taking tylenol at home without relief.

## 2019-04-24 NOTE — Unmapped (Signed)
Spoke w Debria Garret at [facility] who states she will fax [name] 8/21 labs (last ones drawn at their facility) to [phone] Attn: Bruna Potter.

## 2019-04-25 NOTE — Unmapped (Addendum)
External labs from [facility]. received. Collection date 04/13/19.  04/21/19 results in epic.     WBC 7.1  H&H 5.5/15.9  MCV 103  Ptls 165  ANC 1.7   Retic  19.0%  LDH 1468    Results scanned into Epic.

## 2019-04-26 NOTE — Unmapped (Signed)
Yeah!!! That was a tough-get!!  Thanks for the [employer]  E

## 2019-05-02 NOTE — Unmapped (Signed)
T/C to pt's listed # all circuits busy. Called pt's mother who states correct # for pt is [phone] (#updated in Epic).  Left VM at this # suggesting he contact company that supplies his 02 equipment etc to see if they would be able to supply a machine that is less noisy. If that was not a viable option, requested pt call [facility] RN line and let them know what O2 service he uses: Per Dr. Clarene Duke - his current unit is too noisy.   Also encouraged pt to call [facility] schedulers for f/u appt as he currently has none on the books.

## 2019-05-03 NOTE — Unmapped (Signed)
Pt called to report that his oxygen company is [employer].

## 2019-05-04 NOTE — Unmapped (Signed)
[facility] Refill Coordination Note    Specialty Medication(s) to be Shipped:   Hematology/Oncology: Procrit    Other medication(s) to be shipped: syringes     [name] [name], DOB: 08/24/1970  Phone: [phone] (home)       All above HIPAA information was verified with patient.     Completed refill call assessment today to schedule patient's medication shipment from the [facility] [phone]).       Specialty medication(s) and dose(s) confirmed: Regimen is correct and unchanged.   Changes to medications: [name] [name] reports no changes at this time.  Changes to insurance: No  Questions for the pharmacist: No    Confirmed patient received Welcome Packet with first shipment. The patient will receive a drug information handout for each medication shipped and additional FDA Medication Guides as required.       DISEASE/MEDICATION-SPECIFIC INFORMATION        N/A    SPECIALTY MEDICATION ADHERENCE     Medication Adherence    Patient reported X missed doses in the last month: 0  Specialty Medication: procrit 40,000 unit/ml  Patient is on additional specialty medications: No  Patient is on more than two specialty medications: No  Any gaps in refill history greater than 2 weeks in the last 3 months: no  Demonstrates understanding of importance of adherence: yes  Informant: patient  Reliability of informant: reliable  Confirmed plan for next specialty medication refill: delivery by pharmacy  Refills needed for supportive medications: not needed                procrit 40,000 unit/ml. 14 days on hand      SHIPPING     Shipping address confirmed in Epic.     Delivery Scheduled: Yes, Expected medication delivery date: [id number].     Medication will be delivered via Same Day Courier to the home address in Epic WAM.    [name] [name] [name] [name]   [facility] Specialty Technician

## 2019-05-08 ENCOUNTER — Ambulatory Visit: Admit: 2019-05-08 | Discharge: 2019-05-09 | Payer: MEDICARE | Attending: Internal Medicine | Primary: Internal Medicine

## 2019-05-08 DIAGNOSIS — R809 Proteinuria, unspecified: Secondary | ICD-10-CM

## 2019-05-08 DIAGNOSIS — N189 Chronic kidney disease, unspecified: Secondary | ICD-10-CM

## 2019-05-08 DIAGNOSIS — D571 Sickle-cell disease without crisis: Secondary | ICD-10-CM

## 2019-05-08 LAB — PROTEIN/CREAT RATIO, URINE: Protein/Creatinine:MRto:Pt:Urine:Qn:: 0.213

## 2019-05-08 LAB — ALBUMIN QUANT URINE: Albumin:MCnc:Pt:Urine:Qn:: 5.7

## 2019-05-08 LAB — PROTEIN / CREATININE RATIO, URINE: PROTEIN/CREAT RATIO, URINE: 0.213

## 2019-05-08 MED ORDER — DICLOFENAC 1 % TOPICAL GEL
Freq: Every day | TOPICAL | prn refills | 50.00000 days | Status: CP | PRN
Start: 2019-05-08 — End: ?

## 2019-05-08 NOTE — Unmapped (Signed)
Referring Provider: Scheryl Marten, MD     PCP:  Fredirick Maudlin, MD    05/08/2019     Chief Complaint: Sickle Cell Nephropathy     HPI:  [name] is a 48 y.o. year-old male with history of sickle cell anemia and hyperkalemia who presents for follow up visit. Patient has not been seen in clinic since June 2018.     Since last seen, patient was started on Voxelator 1500 mg daily on 02/28/19 in the context of his hyperhemolysis. However, patient self discontinued this medication due to complaints of nausea. He has not been started on any additional medications to manage his sickle cell disease. Discussed restarting hydroxyurea, however patient does not wish to restart any additional medications.     His last admission was in April 2020, after presenting to hematology clinic and found to be hypoxic with a Hgb of 5.4. Covid swab was negative. Hypoxia likely secondary to anemia with little reserve, in setting of his underlying pulmonary hypertension. He was unable to receive blood transfusion given positive antibodies and hematology felt higher dose EPO should be tried to help with counts. He admits to missing occasional doses due to not wanting to stick himself with needle.     Today, he complains of 2 weeks of right ankle pain and swelling. Initially developed soreness to right ankle, later experiencing pain to both feet. No known injury or trauma. Does not feel that pain was typical of flare, no history of gout. Symptoms have improved after resting and staying off ankle for a few days. Currently denies any lower extremity swelling. Otherwise, he is doing well. Denies difficulty breathing, chest pain, fever, cough or chills. Reports urine occasionally appears a dark color, but no foamy/frothy urine.      ROS:   ROS is otherwise as noted in HPI; all other systems reviewed and are negative except as listed above.    MEDICATIONS:  Medication Sig   ??? carBAMazepine (TEGRETOL  XR) 400 MG 12 hr tablet Take 2 tablets (800 mg total) by mouth Two (2) times a day.   ??? cholecalciferol, vitamin D3, (VITAMIN D3) 10 mcg (400 unit) cap Take 1 capsule (400 Units total) by mouth daily.   ??? folic acid (FOLVITE) 1 MG tablet Take 1 tablet (1 mg total) by mouth daily.   ??? phenytoin (DILANTIN) 100 MG ER capsule Take 2 capsules (200 mg total) by mouth Two (2) times a day.   ??? diclofenac sodium (VOLTAREN) 1 % gel Apply 2 g topically daily as needed for arthritis.   ??? empty container (SHARPS CONTAINER) Misc use as directed   ??? epoetin alfa (EPOGEN,PROCRIT) 40,000 unit/mL injection Inject 1 mL (40,000 Units total) under the skin Two (2) times a week.   ??? nystatin-triamcinolone (MYCOLOG) 100,000-0.1 unit/gram-% ointment Apply topically Two (2) times a day.   ??? oxyCODONE (ROXICODONE) 5 MG immediate release tablet Take 1 tablet (5 mg total) by mouth every eight (8) hours as needed for pain for up to 5 doses.   ??? OXYGEN-AIR DELIVERY SYSTEMS MISC 2 L/min by Each Nare route nightly. [employer]   ??? promethazine (PHENERGAN) 12.5 MG tablet Take 12.5 mg by mouth every six (6) hours as needed for nausea.   ??? sodium polystyrene (KAYEXALATE) powder Take 15 g by mouth continuous as needed (with high potassium food (e.g. banana)).   ??? syringe with needle (BD TUBERCULIN SYRINGE) 1 mL 25 gauge x 5/8 Syrg 1 each by Miscellaneous route  Every Tuesday and Friday.     ALLERGIES  Patient has no known allergies.    PAST MEDICAL HISTORY:  Past Medical History:   Diagnosis Date   ??? Anemia    ??? Foot pain    ??? 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 Hypertension    ??? Pulmonary hypertension (CMS-HCC)    ??? Seizures (CMS-HCC)    ??? Sickle cell anemia (CMS-HCC)    ??? Vitamin D deficiency        Past Surgical History:   Procedure Laterality Date   ??? PR RIGHT HEART CATH O2 SATURATION & CARDIAC OUTPUT N/A 07/18/2017    Procedure: Right Heart Catheterization;  Surgeon: Zannie Cove, MD;  Location: [facility] CATH;  Service: Cardiology       PHYSICAL EXAM:  Vitals: 05/08/19 1201   BP: 100/52   Pulse: 60   Temp: 36.9 ??C (98.5 ??F)     Physical Exam   Constitutional: He is oriented to person, place, and time. No distress.   HENT:   Head: Normocephalic and atraumatic.   Eyes: Pupils are equal, round, and reactive to light. Scleral icterus is present.   Neck: Normal range of motion. Neck supple. No tracheal deviation present. No thyromegaly present.   Cardiovascular: Normal rate and regular rhythm.   Pulmonary/Chest: Effort normal and breath sounds normal. No respiratory distress. He has no wheezes.   Musculoskeletal:         General: No edema.   Neurological: He is alert and oriented to person, place, and time.   Skin: Skin is warm and dry. He is not diaphoretic.   Psychiatric: Affect normal.     MEDICAL DECISION MAKING  No visits with results within 1 Week(s) from this visit.   Latest known visit with results is:   Admission on 04/21/2019, Discharged on 04/21/2019   Component Date Value Ref Range Status   ??? Sodium 04/21/2019 139  135 - 145 mmol/L Final   ??? Potassium 04/21/2019 5.0  3.5 - 5.0 mmol/L Final   ??? Chloride 04/21/2019 111* 98 - 107 mmol/L Final   ??? Anion Gap 04/21/2019 3* 7 - 15 mmol/L Final   ??? CO2 04/21/2019 25.0  22.0 - 30.0 mmol/L Final   ??? BUN 04/21/2019 17  7 - 21 mg/dL Final   ??? Creatinine 04/21/2019 0.81  0.70 - 1.30 mg/dL Final   ??? BUN/Creatinine Ratio 04/21/2019 21   Final   ??? EGFR CKD-EPI Non-African American,* 04/21/2019 >90  >=60 mL/min/1.31m2 Final   ??? EGFR CKD-EPI African American, Male 04/21/2019 >90  >=60 mL/min/1.68m2 Final   ??? Glucose 04/21/2019 89  70 - 179 mg/dL Final   ??? Calcium 16/05/9603 9.1  8.5 - 10.2 mg/dL Final   ??? Albumin 54/04/8118 3.8  3.5 - 5.0 g/dL Final   ??? Total Protein 04/21/2019 8.0  6.5 - 8.3 g/dL Final   ??? Total Bilirubin 04/21/2019 5.9* 0.0 - 1.2 mg/dL Final   ??? AST 14/78/2956 108* 19 - 55 U/L Final   ??? ALT 04/21/2019 24  <50 U/L Final   ??? Alkaline Phosphatase 04/21/2019 332* 38 - 126 U/L Final   ??? LDH 04/21/2019 3,771* 338 - 610 U/L Final   ??? Reticulocyte Manual % 04/21/2019 13.9* 0.8 - 2.0 % Final   ??? Absolute Manual Reticulocyte 04/21/2019 230.7* 27.0 - 120.0 10*9/L Final   ??? EKG Ventricular Rate 04/21/2019 53  BPM Final   ??? EKG Atrial Rate 04/21/2019 53  BPM Final   ???  EKG P-R Interval 04/21/2019 202  ms Final   ??? EKG QRS Duration 04/21/2019 102  ms Final   ??? EKG Q-T Interval 04/21/2019 504  ms Final   ??? EKG QTC Calculation 04/21/2019 472  ms Final   ??? EKG Calculated P Axis 04/21/2019 45  degrees Final   ??? EKG Calculated R Axis 04/21/2019 38  degrees Final   ??? EKG Calculated T Axis 04/21/2019 32  degrees Final   ??? QTC Fredericia 04/21/2019 483  ms Final   ??? WBC 04/21/2019 6.8  4.5 - 11.0 10*9/L Final   ??? RBC 04/21/2019 1.58* 4.50 - 5.90 10*12/L Final   ??? HGB 04/21/2019 6.0* 13.5 - 17.5 g/dL Final   ??? HCT 16/05/9603 17.9* 41.0 - 53.0 % Final   ??? MCV 04/21/2019 113.0* 80.0 - 100.0 fL Final   ??? MCH 04/21/2019 38.3* 26.0 - 34.0 pg Final   ??? MCHC 04/21/2019 33.9  31.0 - 37.0 g/dL Final   ??? RDW 54/04/8118 23.0* 12.0 - 15.0 % Final   ??? MPV 04/21/2019 8.8  7.0 - 10.0 fL Final   ??? Platelet 04/21/2019 154  150 - 440 10*9/L Final   ??? nRBC 04/21/2019 39* <=4 /100 WBCs Final   ??? Variable HGB Concentration 04/21/2019 Marked* Not Present Final   ??? Macrocytosis 04/21/2019 Marked* Not Present Final   ??? Anisocytosis 04/21/2019 Marked* Not Present Final   ??? Hyperchromasia 04/21/2019 Slight* Not Present Final   ??? Hypochromasia 04/21/2019 Moderate* Not Present Final   ??? Neutrophils % 04/21/2019 41  % Final   ??? Lymphocytes % 04/21/2019 38  % Final   ??? Monocytes % 04/21/2019 21  % Final   ??? Eosinophils % 04/21/2019 0  % Final   ??? Basophils % 04/21/2019 0  % Final   ??? Absolute Neutrophils 04/21/2019 2.8  2.0 - 7.5 10*9/L Final   ??? Absolute Lymphocytes 04/21/2019 2.6  1.5 - 5.0 10*9/L Final   ??? Absolute Monocytes 04/21/2019 1.4* 0.2 - 0.8 10*9/L Final   ??? Absolute Eosinophils 04/21/2019 0.0  0.0 - 0.4 10*9/L Final   ??? Absolute Basophils 04/21/2019 0.0  0.0 - 0.1 10*9/L Final   ??? Smear Review Comments 04/21/2019 See Comment* Undefined Final   ??? Giant Platelets 04/21/2019 Present* Not Present Final   ??? Polychromasia 04/21/2019 Moderate* Not Present Final   ??? Target Cells 04/21/2019 Moderate* Not Present Final   ??? Sickle Cells 04/21/2019 Present* Not Present Final       Creatinine   Date Value Ref Range Status   04/21/2019 0.81 0.70 - 1.30 mg/dL Final   14/78/2956 2.13 0.70 - 1.30 mg/dL Final   08/65/7846 9.62 0.70 - 1.30 mg/dL Final   95/28/4132 4.40 0.70 - 1.30 mg/dL Final   06/19/2535 6.44 (L) 0.70 - 1.30 mg/dL Final   03/47/4259 5.63 0.70 - 1.30 mg/dL Final   87/56/4332 9.51 0.70 - 1.30 mg/dL Final   88/41/6606 3.01 0.70 - 1.30 mg/dL Final   60/05/9322 5.57 (L) 0.70 - 1.30 mg/dL Final   32/20/2542 7.06 0.70 - 1.30 mg/dL Final   23/76/2831 5.17 0.70 - 1.30 mg/dL Final   61/60/7371 0.62 0.70 - 1.30 mg/dL Final   69/48/5462 7.03 0.70 - 1.30 mg/dL Final   50/04/3817 2.99 0.70 - 1.30 MG/DL Final   37/16/9678 9.38 0.70 - 1.30 MG/DL Final   06/08/5101 5.85 0.70 - 1.30 MG/DL Final   27/78/2423 5.36 0.70 - 1.30 MG/DL Final     Protein/Creatinine Ratio, Urine  Date Value Ref Range Status   10/19/2016 0.969 Undefined Final   06/15/2016 0.180 Undefined Final   11/04/2015 0.551 Undefined Final   07/24/2015 0.428 Undefined Final       ASSESSMENT/PLAN:  [name] is a 48 y.o. male with a past medical history significant for sickle cell anemia who returns for follow up visit.    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 Creatinine has been progressively creeping up over the last few months. Patient is not a candidate for ACE-I/ARB due to issue with hyperkalemia and low blood pressures. We discussed that he would likely benefit from hydroxyurea, however he is hesitant to starting any additional medications. He plans to reach out to neurologist and see if we could consolidate his dilantin dose to decrease number of pills he is taking per day. He is amenable to considering starting on low dose Hydroxyurea if we are able to reduce his pill burden.     2. 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 Last several values have been at goal. Uses kayexalate when he eats high K food. Had been on fludrocortisone in the past with some success but stopped this due to cost. If K remains stable, would continue with the present regimen. Could also consider addition of patiromer in future if unable to tolerate kayexalate or requires it more often.    3. Bone-mineral metabolism -  PTH was also elevated in March 2019 at 177, indicative of possible renal disease. Will repeat renal function panel again today as well as PTH.     4. Seizures d/o - On dilantin and tegretol. Managed by Dr. Craig Staggers. Will see if can consolidate dilantin    5. Ankle pain/swelling - resolved. Unclear if this was related to minor pain crisis vs underlying cardiac issues; possibly gout flare although would be unusual given laterality of symptoms. Hesitant to start patient on a diuretic, given symptoms have resolved and he has no swelling on exam today. Encouraged to contact clinic if symptoms recur to see if we can schedule an appointment for evaluation.      6. Health maintenance - will need flu vaccine when able.  Immunization History   Administered Date(s) Administered   ??? Hepatitis B, Adult 03/25/2009, 04/24/2009   ??? INFLUENZA TIV (TRI) PF (IM) 07/21/2009, 09/12/2012   ??? Influenza Vaccine Quad (IIV4 PF) 49mo+ injectable 07/04/2015, 05/07/2016, 04/20/2018   ??? Influenza Virus Vaccine, unspecified formulation 10/11/2018   ??? Meningococcal C Conjugate 03/27/2013   ??? PNEUMOCOCCAL POLYSACCHARIDE 23 03/25/2009, 11/11/2017   ??? Pneumococcal Conjugate 13-Valent 07/04/2015     [name] will follow up in in 4 months or sooner if other indications arise.    Scribe's Attestation: Bess Kinds, MD obtained and performed the history, physical exam and medical decision making elements that were entered into the chart.  Signed by Jerl Santos, Scribe, on May 08, 2019 at 12:40 PM.    Attending Statement: Documentation assistance provided by the Scribe. I was present during the time the encounter was recorded. The information recorded by the Scribe was done at my direction and has been reviewed and validated by me with edits and additions as indicated.    Maryruth Hancock, MD  [facility] Division of Nephrology & Hypertension

## 2019-05-11 MED FILL — PROCRIT 40,000 UNIT/ML INJECTION SOLUTION: 28 days supply | Qty: 8 | Fill #2 | Status: AC

## 2019-05-11 MED FILL — BD TUBERCULIN SYRINGE 1 ML 25 GAUGE X 5/8": 28 days supply | Qty: 8 | Fill #2

## 2019-05-11 MED FILL — BD TUBERCULIN SYRINGE 1 ML 25 GAUGE X 5/8": 28 days supply | Qty: 8 | Fill #2 | Status: AC

## 2019-05-11 MED FILL — PROCRIT 40,000 UNIT/ML INJECTION SOLUTION: SUBCUTANEOUS | 28 days supply | Qty: 8 | Fill #2

## 2019-05-11 NOTE — Unmapped (Addendum)
Called [facility] and spoke to [place] asking someone to contact [name] about getting a quieter oxygen machine. [phone]

## 2019-05-14 NOTE — Unmapped (Signed)
Patient called clinic to report worsening terrible pain on right foot with onset 2 months ago. Pt reports not able to stand of right foot. Delice Bison in clinic notified. Advised patient to proceed to ED for orthopaedic evaluation. Pt verbalized understanding, agreed with plan.

## 2019-05-15 NOTE — Unmapped (Signed)
He had labs drawn 8/29. On my call list for 9/28 for HU safety labs. Is that not appropriate time-frame?

## 2019-05-17 NOTE — Unmapped (Signed)
Per [employer] message from Dr. Clarene Duke - Spoke w/pt re: lab work needed this week. Pt was confused as to why he needed labs drawn. York Spaniel he gets them done when he goes to his [facility] clinic visits   I advised him standing monthly orders for draws were already with [facility] (last drawn there in August).so if he prefers that location, that was fine; I just needed to know. Conversation ended with pt agreeing to go to [facility] next week (Tuesday) for draw.   Please ensure all lab orders are verified and fulfilled when he comes as there are multiple requests for this pt.

## 2019-05-18 NOTE — Unmapped (Signed)
Pt called to request refill for Oxycodone.Reports having foot pain.  Instructed patient to seek medical care for foot problems with PCP however, patient does not have PCP. Advised to proceed to ED for evaluation.  Discussed with patient he is due for labs. Pt will get labs next week.   Dr Clarene Duke, pt has in person appointment on 06/13/19. Does he need to be seen earlier?

## 2019-05-21 ENCOUNTER — Other Ambulatory Visit: Admit: 2019-05-21 | Discharge: 2019-05-22 | Payer: MEDICARE

## 2019-05-21 DIAGNOSIS — D571 Sickle-cell disease without crisis: Secondary | ICD-10-CM

## 2019-05-21 DIAGNOSIS — R809 Proteinuria, unspecified: Secondary | ICD-10-CM

## 2019-05-21 DIAGNOSIS — N189 Chronic kidney disease, unspecified: Secondary | ICD-10-CM

## 2019-05-21 LAB — VARIABLE HEMOGLOBIN CONCENTRATION

## 2019-05-21 LAB — CBC W/ AUTO DIFF
HEMATOCRIT: 17.6 % — ABNORMAL LOW (ref 41.0–53.0)
HEMOGLOBIN: 5.8 g/dL — ABNORMAL LOW (ref 13.5–17.5)
MEAN CORPUSCULAR HEMOGLOBIN CONC: 33.1 g/dL (ref 31.0–37.0)
MEAN CORPUSCULAR HEMOGLOBIN: 37.5 pg — ABNORMAL HIGH (ref 26.0–34.0)
MEAN CORPUSCULAR VOLUME: 113.4 fL — ABNORMAL HIGH (ref 80.0–100.0)
MEAN PLATELET VOLUME: 10.7 fL — ABNORMAL HIGH (ref 7.0–10.0)
NUCLEATED RED BLOOD CELLS: 19 /100{WBCs} — ABNORMAL HIGH (ref <=4)
PLATELET COUNT: 198 10*9/L (ref 150–440)
RED BLOOD CELL COUNT: 1.55 10*12/L — ABNORMAL LOW (ref 4.50–5.90)
WBC ADJUSTED: 7.7 10*9/L (ref 4.5–11.0)

## 2019-05-21 LAB — BILIRUBIN DIRECT: Bilirubin.glucuronidated+Bilirubin.albumin bound:MCnc:Pt:Ser/Plas:Qn:: 2.2 — ABNORMAL HIGH

## 2019-05-21 LAB — COMPREHENSIVE METABOLIC PANEL
ALBUMIN: 3.7 g/dL (ref 3.5–5.0)
ALKALINE PHOSPHATASE: 270 U/L — ABNORMAL HIGH (ref 38–126)
ALT (SGPT): 21 U/L (ref <50)
ANION GAP: 8 mmol/L (ref 7–15)
AST (SGOT): 98 U/L — ABNORMAL HIGH (ref 19–55)
BLOOD UREA NITROGEN: 15 mg/dL (ref 7–21)
BUN / CREAT RATIO: 19
CALCIUM: 8.9 mg/dL (ref 8.5–10.2)
CHLORIDE: 111 mmol/L — ABNORMAL HIGH (ref 98–107)
CO2: 23 mmol/L (ref 22.0–30.0)
CREATININE: 0.77 mg/dL (ref 0.70–1.30)
EGFR CKD-EPI AA MALE: >90 mL/min/{1.73_m2} (ref >=60)
EGFR CKD-EPI NON-AA MALE: >90 mL/min/{1.73_m2} (ref >=60)
GLUCOSE RANDOM: 72 mg/dL (ref 70–179)
POTASSIUM: 4.9 mmol/L (ref 3.5–5.0)
SODIUM: 142 mmol/L (ref 135–145)

## 2019-05-21 LAB — SMEAR REVIEW

## 2019-05-21 LAB — MANUAL DIFFERENTIAL
BASOPHILS - ABS (DIFF): 0 10*9/L (ref 0.0–0.1)
BASOPHILS - REL (DIFF): 0 %
EOSINOPHILS - ABS (DIFF): 0.1 10*9/L (ref 0.0–0.4)
EOSINOPHILS - REL (DIFF): 1 %
LYMPHOCYTES - REL (DIFF): 33 %
MONOCYTES - ABS (DIFF): 2.3 10*9/L — ABNORMAL HIGH (ref 0.2–0.8)
MONOCYTES - REL (DIFF): 30 %

## 2019-05-21 LAB — PHOSPHORUS: Phosphate:MCnc:Pt:Ser/Plas:Qn:: 4.5

## 2019-05-21 LAB — PARATHYROID HORMONE INTACT: Parathyrin.intact:MCnc:Pt:Ser/Plas:Qn:: 91 — ABNORMAL HIGH

## 2019-05-21 LAB — LACTATE DEHYDROGENASE: Lactate dehydrogenase:CCnc:Pt:Ser/Plas:Qn:Reaction: pyruvate to lactate: 3912 — ABNORMAL HIGH

## 2019-05-21 LAB — RETIC COUNT, MANUAL: Lab: 14.4 — ABNORMAL HIGH

## 2019-05-21 LAB — ANION GAP: Anion gap 3:SCnc:Pt:Ser/Plas:Qn:: 8

## 2019-05-21 LAB — PARATHYROID HOMONE (PTH): CALCIUM: 8.9 mg/dL (ref 8.5–10.2)

## 2019-05-21 NOTE — Unmapped (Signed)
Pt walked in for labs. Labs drawn, collected and sent for processing.

## 2019-05-22 LAB — SYPHILIS RPR SCREEN: Reagin Ab:PrThr:Pt:Ser:Ord:RPR: NONREACTIVE

## 2019-05-30 DIAGNOSIS — D571 Sickle-cell disease without crisis: Secondary | ICD-10-CM

## 2019-05-30 NOTE — Unmapped (Signed)
[name] [name] has severe sickle cell anemia, with a hemoglobin ranging at 5-6 g/dL. He cannot be transfused because of his immune reaction to blood. 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 He has been unable to give himself this treatment consistently and is progressively anemic.

## 2019-05-31 MED ORDER — EPOETIN ALFA 40,000 UNIT/ML INJECTION SOLUTION
SUBCUTANEOUS | 3 refills | 28 days | Status: CP
Start: 2019-05-31 — End: 2020-05-30
  Filled 2019-06-15: qty 8, 28d supply, fill #0

## 2019-06-12 MED ORDER — BD TUBERCULIN SYRINGE 1 ML 25 GAUGE X 5/8": 1 | each | 2 refills | 28 days | Status: AC

## 2019-06-12 NOTE — Unmapped (Signed)
In clinic appointment confirmed. Pre chart complete.

## 2019-06-12 NOTE — Unmapped (Signed)
[facility] Refill Coordination Note    Specialty Medication(s) to be Shipped:   General Specialty: PROCRIT     Other medication(s) to be shipped: SYRINGES (ORANGE TOP PREFD)     [name] [name], DOB: January 28, 1971  Phone: [phone] (home)       All above HIPAA information was verified with patient.     Completed refill call assessment today to schedule patient's medication shipment from the [facility] [phone]).       Specialty medication(s) and dose(s) confirmed: Regimen is correct and unchanged.   Changes to medications: [name] [name] reports no changes at this time.  Changes to insurance: No  Questions for the pharmacist: No    Confirmed patient received Welcome Packet with first shipment. The patient will receive a drug information handout for each medication shipped and additional FDA Medication Guides as required.       DISEASE/MEDICATION-SPECIFIC INFORMATION        For patients on injectable medications: Patient currently has 8 doses left.  Next injection is scheduled for  .    SPECIALTY MEDICATION ADHERENCE     Medication Adherence    Patient reported X missed doses in the last month: 0  Specialty Medication: EPOETIN 29562   Patient is on additional specialty medications: No  Informant: patient  Confirmed plan for next specialty medication refill: delivery by pharmacy  Refills needed for supportive medications: yes, ordered or provider notified          Refill Coordination    Has the Patients' Contact Information Changed: No  Is the Shipping Address Different: No           PROCRIT 40,000 U: 8 DOSES days of medicine on hand         SHIPPING     Shipping address confirmed in Epic.     Delivery Scheduled: Yes, Expected medication delivery date: 10/23.     Medication will be delivered via Same Day Courier to the home address in [employer] [place].    [name] [name]   [facility] Specialty Technician

## 2019-06-15 MED FILL — PROCRIT 40,000 UNIT/ML INJECTION SOLUTION: 28 days supply | Qty: 8 | Fill #0 | Status: AC

## 2019-06-15 MED FILL — BD TUBERCULIN SYRINGE 1 ML 25 GAUGE X 5/8": 28 days supply | Qty: 8 | Fill #0

## 2019-06-15 MED FILL — BD TUBERCULIN SYRINGE 1 ML 25 GAUGE X 5/8": 28 days supply | Qty: 8 | Fill #0 | Status: AC

## 2019-06-24 NOTE — Unmapped (Signed)
Not seen

## 2019-06-24 NOTE — Unmapped (Signed)
It was a pleasure to see you today in clinic!    Plan from today's visit  ? Follow up in   ?         How to get in touch with your Sickle Cell Team:     1. If you are sick, and need an ambulance, CALL 911.    ? Otherwise, call [facility] at [phone], 8AM - 5PM, and ask for the NURSE TRIAGE LINE and leave a message   ? On nights and weekends, for urgent problems you can call the [phone] and ask for the HEMATOLOGIST ON CALL.     2.  For an appointment, please call [facility] at [phone], 8AM - 5PM.    3.  For pain medication refills, leave a message on nurse triage line THREE days before refill needed. All other medication refills ask your pharmacy to fax request.       4.  For general questions, call the sickle cell office, [phone]. You can leave non-urgent messages here as well.    5.  For social work questions or needs; Please call Dickie La RN, MSW, MPH at [phone]

## 2019-06-26 NOTE — Unmapped (Signed)
Contacted patient to confirm scheduled appointment and prepare chart for visit. Patient verified that in clinic visit was preferred.      Triage complete.

## 2019-06-26 NOTE — Unmapped (Signed)
Attempted to reach patient for scheduled appointment. Patient unreachable. Left voicemail.

## 2019-06-27 ENCOUNTER — Ambulatory Visit: Admit: 2019-06-27 | Discharge: 2019-06-27 | Payer: MEDICARE | Attending: Hematology | Primary: Hematology

## 2019-06-27 ENCOUNTER — Ambulatory Visit: Admit: 2019-06-27 | Discharge: 2019-06-27 | Payer: MEDICARE

## 2019-06-27 DIAGNOSIS — D571 Sickle-cell disease without crisis: Principal | ICD-10-CM

## 2019-06-27 DIAGNOSIS — R0602 Shortness of breath: Principal | ICD-10-CM

## 2019-06-27 LAB — CBC W/ AUTO DIFF
BASOPHILS ABSOLUTE COUNT: 0 10*9/L (ref 0.0–0.1)
BASOPHILS RELATIVE PERCENT: 0.5 %
EOSINOPHILS ABSOLUTE COUNT: 0.1 10*9/L (ref 0.0–0.4)
EOSINOPHILS RELATIVE PERCENT: 1.1 %
HEMATOCRIT: 18.2 % — CL (ref 41.0–53.0)
LARGE UNSTAINED CELLS: 4 % (ref 0–4)
LYMPHOCYTES ABSOLUTE COUNT: 2.9 10*9/L (ref 1.5–5.0)
LYMPHOCYTES RELATIVE PERCENT: 41.5 %
MEAN CORPUSCULAR HEMOGLOBIN CONC: 33.4 g/dL (ref 31.0–37.0)
MEAN CORPUSCULAR HEMOGLOBIN: 36.7 pg — ABNORMAL HIGH (ref 26.0–34.0)
MEAN CORPUSCULAR VOLUME: 109.9 fL — ABNORMAL HIGH (ref 80.0–100.0)
MEAN PLATELET VOLUME: 8.9 fL (ref 7.0–10.0)
MONOCYTES ABSOLUTE COUNT: 1.4 10*9/L — ABNORMAL HIGH (ref 0.2–0.8)
MONOCYTES RELATIVE PERCENT: 19.5 %
NEUTROPHILS ABSOLUTE COUNT: 2.3 10*9/L (ref 2.0–7.5)
NEUTROPHILS RELATIVE PERCENT: 33.6 %
NUCLEATED RED BLOOD CELLS: 11 /100{WBCs} — ABNORMAL HIGH (ref <=4)
PLATELET COUNT: 178 10*9/L (ref 150–440)
RED BLOOD CELL COUNT: 1.66 10*12/L — ABNORMAL LOW (ref 4.50–5.90)
WBC ADJUSTED: 6.9 10*9/L (ref 4.5–11.0)

## 2019-06-27 LAB — COMPREHENSIVE METABOLIC PANEL
ALBUMIN: 4 g/dL (ref 3.5–5.0)
ALKALINE PHOSPHATASE: 375 U/L — ABNORMAL HIGH (ref 38–126)
ANION GAP: 6 mmol/L — ABNORMAL LOW (ref 7–15)
AST (SGOT): 158 U/L — ABNORMAL HIGH (ref 17–47)
BILIRUBIN TOTAL: 7.1 mg/dL — ABNORMAL HIGH (ref 0.0–1.2)
BLOOD UREA NITROGEN: 28 mg/dL — ABNORMAL HIGH (ref 7–21)
BUN / CREAT RATIO: 30
CALCIUM: 8.9 mg/dL (ref 8.5–10.2)
CHLORIDE: 108 mmol/L — ABNORMAL HIGH (ref 98–107)
CO2: 23 mmol/L (ref 22.0–30.0)
CREATININE: 0.93 mg/dL (ref 0.70–1.30)
EGFR CKD-EPI AA MALE: >90 mL/min/{1.73_m2} (ref >=60)
EGFR CKD-EPI NON-AA MALE: >90 mL/min/{1.73_m2} (ref >=60)
GLUCOSE RANDOM: 81 mg/dL (ref 70–179)
POTASSIUM: 6.3 mmol/L (ref 3.5–5.0)
PROTEIN TOTAL: 8.7 g/dL — ABNORMAL HIGH (ref 6.5–8.3)

## 2019-06-27 LAB — SLIDE REVIEW

## 2019-06-27 LAB — RETIC COUNT, MANUAL: Lab: 8 — ABNORMAL HIGH

## 2019-06-27 LAB — MEAN CORPUSCULAR HEMOGLOBIN CONC: Lab: 33.4

## 2019-06-27 LAB — LACTATE DEHYDROGENASE: Lactate dehydrogenase:CCnc:Pt:Ser/Plas:Qn:Reaction: pyruvate to lactate: 4163 — ABNORMAL HIGH

## 2019-06-27 LAB — BILIRUBIN TOTAL: Bilirubin:MCnc:Pt:Ser/Plas:Qn:: 7.1 — ABNORMAL HIGH

## 2019-06-27 LAB — POIKILOCYTES

## 2019-06-27 LAB — PRO-BNP: Natriuretic peptide.B prohormone N-Terminal:MCnc:Pt:Ser/Plas:Qn:: 762 — ABNORMAL HIGH

## 2019-06-27 LAB — GAMMA GLUTAMYL TRANSFERASE: Gamma glutamyl transferase:CCnc:Pt:Ser/Plas:Qn:: 564 — ABNORMAL HIGH

## 2019-06-27 MED ORDER — HYDROXYUREA 500 MG CAPSULE
ORAL_CAPSULE | Freq: Every day | ORAL | 0 refills | 30.00000 days | Status: CP
Start: 2019-06-27 — End: ?
  Filled 2019-07-02: qty 30, 30d supply, fill #0

## 2019-06-27 NOTE — Unmapped (Signed)
Informed MD of patient's O2 sat.

## 2019-06-27 NOTE — Unmapped (Signed)
It was a pleasure to see you today in clinic!    Plan from today's visit  ? Continue to use your oxygen  ? Start low dose hydroxyurea to see if this helps the blood counts  ? Get your blood work done in 3 weeks    How to get in touch with your Sickle Cell Team:     1. If you are sick, and need an ambulance, CALL 911.    ? Otherwise, call [facility] at [phone], 8AM - 5PM, and ask for the NURSE TRIAGE LINE and leave a message   ? On nights and weekends, for urgent problems you can call the [phone] and ask for the HEMATOLOGIST ON CALL.     2.  For an appointment, please call [facility] at [phone], 8AM - 5PM.    3.  For pain medication refills, leave a message on nurse triage line THREE days before refill needed. All other medication refills ask your pharmacy to fax request.       4.  For general questions, call the sickle cell office, [phone]. You can leave non-urgent messages here as well.    5.  For social work questions or needs; Please call Dickie La RN, MSW, MPH at [phone]

## 2019-06-27 NOTE — Unmapped (Signed)
[facility] Follow-up Note      Primary Care Physician:   Fredirick Maudlin, MD    Reason for Visit:  Follow up sickle cell anemia, Hgb SS    Assessment/Recommendations:   [name] [name] is a 48 y.o. year old African-American male who returns for follow-up of sickle cell anemia.      1.  Sickle cell anemia, Hgb SS: severe anemia with macrocytosis. Not on hydrea through choice and previous borderline ANC while on hydroxyurea (ANC 1.2). Remains on procrit 40K units twice weekly with intermittent compliance. His labs from today continue to show severe anemia with Hgb 6.1 hemoglobin. LDH and total bilirubin remain elevated and are higher than previous values, suggesting ongoing severe hemolysis. From a symptomatic standpoint, the PROMIS pain-interference score today is 64.8 (41-77), up from 55.8 on 03/21/19, which suggests his disease remains under-controlled despite his report that he is doing ok.  We had a prolonged discussion about his severe anemia and the need for disease modifying therapy. He remains resistant to a repeat trial of lower dose voxelotor. However, he agreed to try low dose hydroxyurea to see if this helps.  - Continue with folic acid daily  - We will restart low dose at hydroxyurea 500 mg daily. Previously had low ANC while on HU so will monitor CBC closely and recheck labs in 3 weeks   - Continue epo 40K units twice weekly    2. Pulmonary HTN / Hypoxia: He is not wearing oxygen consistently at night, as he reports the noise of the condenser makes it difficult to sleep and the oxygen dries out his nares.  We have previously advised moving the condenser out of his room to try and improve compliance, but unclear if he has tried this.  Most recent outpatient echo 09/25/2018 TRV 3.1, LA dilatation. Ejection fraction 60-65%, PASP 53. - Continue oxygen as prescribed. Currently prescribed 2-4L at night, but adherence remains an issue. We again discussed the importance of treating his nocturnal hypoxia to avoid end organ toxicity in light of his severe anemia  - Can use vaseline around the nares for dryness while on oxygen    3. Proteinuria with intermittent hyperkalemia and low blood pressure in the past: Patient not a candidate for ACE inhibitor or ARB at present.   Elevated PTH suggests CKD.  Missed recent appt with Dr. Tammi Klippel in nephrology on 03/13/19.  Received a critical call from the lab with a potassium of 6.3. I called [name] and requested an urgent ED evaluation tonight for further workup (e.g. EKG, repeat labs) and treatment as needed. He has been on kayexalate in the past which would be reasonable for him to restart, but I am concerned that it will not lower his potassium level quick enough and that he is at risk for cardiac toxicity given his potassium levels.     4. Health maintenance:   Immunization History   Administered Date(s) Administered   ??? Hepatitis B, Adult 03/25/2009, 04/24/2009   ??? INFLUENZA TIV (TRI) PF (IM) 07/21/2009, 09/12/2012   ??? Influenza Vaccine Quad (IIV4 PF) 52mo+ injectable 07/04/2015, 05/07/2016, 04/20/2018   ??? Influenza Virus Vaccine, unspecified formulation 10/11/2018   ??? Meningococcal C Conjugate 03/27/2013   ??? PNEUMOCOCCAL POLYSACCHARIDE 23 03/25/2009, 11/11/2017   ??? Pneumococcal Conjugate 13-Valent 07/04/2015   Hep B sAb neg in 2017 (although previously immunized?). Untransfusable, so may not be a priority at present.  Will also need to start Twinrix series in near  future. Will administer influenza vaccine today.     5. Seizures, last time 3-4 years ago, still on Tegretol and Dilantin. Denied recent seizure activity.    6. Erectile dysfunction: Previously endorsed issues with erectile dysfunction, but denied on assessment today.  Also denied episodes of priapism. FOLLOW UP: Recommended ED evaluation tonight as above.   Otherwise, will have labs checked every 2-3 weeks.  F/U in 1 month to assess response to HU or sooner as needed.     History of Present Illness:   [name] [name] is a 48 y.o. year old African-American male with Hgb SS who is here for follow up. His disease is characterized by severe anemia and difficult to transfuse due to alloantibodies. Has not tolerated disease modifying therapy in the past. He was previously on voxelotor which was discontinued due to diarrhea.      He was last seen here on 03/21/19. In the interim, he went to the ED on 04/21/19 sickle cell pain episode which resolved with medications. He denies any interval shortness of breath, fevers, weakness, headaches, vision changes, chest pain, nausea, vomiting, or diarrhea. He denies any further pain episodes, acute chest, or hospitalizations in the interim. Despite his severe anemia, he feels that he is baseline and denies significant dyspnea at rest or with exertion.     We discussed restarting voxelotor, which he does not want to pursue today because of previous intolerance (diarrhea, nausea). He does admit that he felt better when on voxelotor. We discussed trying hydroxyurea again today.     Continues to take epo twice weekly (Mondays & Thursday), denies frequent missed doses. He admits to one missed dose while awaiting a refill. He is reluctant to continue with the epo as he dislikes giving himself injections. He expresses some frustration with his medication burden.     He reports that he is intermittently adherent to the nocturnal oxygen. Notes that it dries up his sinuses.     Sickle Cell Disease History:  Access: peripheral IV  1.  Genotype: SS (hemolysis: hemoglobin 6 , ARC 100-200 , LDH 3000-5000 , Bilirubin 3-5 , urobilinogen 0.2-2 )  Concomitant alpha thal: ?  Other genetic studies: no  BMT?:  no  Folic acid: yes Hydroxyurea: was on 500mg /day but with mild neutropenia (1200) and minimal F elevation; patient with h/o telling different stories to different providers - in setting of neutropenia and renal dysfunction and minimal improvement in %F, we have not resumed Hydrea.  Started on Voxelator on 7/8 for hyperhemolysis, but discontinued today per pt request secondary to nausea/vomiting.  Studies/protocols: no   2. Pain (typical location): rare - legs, feet  Hospitalization frequency:  rare  Outpatient regimen: tylenol  24h Morphine Equivalent Dose (Goal <100): 0  Bowel regimen: none  Inpatient regimen: ?   2. Transfusions: yes per previous notes but on 11/11/17 patient said 'never'  Life time blood transfusions: 6+  Last transfusion: 02/2018   History of DHTR?: unlikely  Alloantibodies: no  Iron status: ferritin 259 (11/11/17)  Medications: no   3.  CV/Pulm: chronic hypoxia ( improved on HU per previous notes), supposed to be on o2 at night (2LPM); on 11/11/17 states he can 'walk 1/2 mile without getting short of breath'. Unable to complete PFTs on 03/21/19 due to in office fall.  Acute chest syndrome: no  Need for mechanical ventilation?: no  Pulmonary hypertension: yes, h/o hypoxia - NYHA 2 and ? WHO group 5; RHC 06/2017 - mild PxHTN with PAmean =  26, PCW mean of 16 and increased CO, normal PVR 1.2 wood units      4. Neuro:  Stroke/TIA: no  Imaging:     Other: epilepsy diagnosed at 48yo, on carbamazepine and phenytoin; follows regularly with neurology   5. Psych: cognitive delay  Concerns for depression/anxiety?: no  Concerns for substance dependence?: no  Medications:    6. ID/Immune:  Splenectomy?: no  Antibiotics: no  Immunization history:   Immunization History   Administered Date(s) Administered   ??? Hepatitis B, Adult 03/25/2009, 04/24/2009   ??? INFLUENZA TIV (TRI) PF (IM) 07/21/2009, 09/12/2012   ??? Influenza Vaccine Quad (IIV4 PF) 38mo+ injectable 07/04/2015, 05/07/2016, 04/20/2018 ??? Influenza Virus Vaccine, unspecified formulation 10/11/2018   ??? Meningococcal C Conjugate 03/27/2013   ??? PNEUMOCOCCAL POLYSACCHARIDE 23 03/25/2009, 11/11/2017   ??? Pneumococcal Conjugate 13-Valent 07/04/2015     ANA: positive 1:160 on 06/2017  HIV neg 06/2017  Hep BsAb neg 04/2016   7. GI/Hep:  Cholecystectomy?: no  Hepatopathy?: ? - enlarged on u/s 01/2016  Other: no   8. 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 GU:  Priapism?: yes, remote - last at 48yo per notes   10. M/sk:  AVN?: no  Ulcers?: no  Other: left iliac crest fracture (? 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 Endocrine:  Bone/vitamin D: Will need to re-address at time of next clinic visit  Other:    13. Screening:   Ophtho/Retinopathy?: neg 09/2017  Audiology?: no  Dental?: yes   14. Social: watches TV all day, lives alone, drives  Education: high school   Work/Disability:   Smoking: cigars 2x/day  Alcohol:   Other:      Surgical History:  Past Surgical History:   Procedure Laterality Date   ??? PR RIGHT HEART CATH O2 SATURATION & CARDIAC OUTPUT N/A 07/18/2017    Procedure: Right Heart Catheterization;  Surgeon: Zannie Cove, MD;  Location: [facility] CATH;  Service: Cardiology     Medications:   Current Outpatient Medications   Medication Sig Dispense Refill   ??? acetaminophen (TYLENOL 8 HOUR) 650 MG CR tablet Take 650 mg by mouth every eight (8) hours as needed for pain.     ??? carBAMazepine (TEGRETOL  XR) 400 MG 12 hr tablet Take 2 tablets (800 mg total) by mouth Two (2) times a day. 120 tablet 11   ??? cholecalciferol, vitamin D3, (VITAMIN D3) 10 mcg (400 unit) cap Take 1 capsule (400 Units total) by mouth daily. 30 each 0 ??? diclofenac sodium (VOLTAREN) 1 % gel Apply 2 g topically daily as needed for arthritis. 100 g prn   ??? empty container (SHARPS CONTAINER) Misc use as directed 1 each 2   ??? epoetin alfa (EPOGEN,PROCRIT) 40,000 unit/mL injection Inject 1 mL (40,000 Units total) under the skin Two (2) times a week. 8 mL 3   ??? folic acid (FOLVITE) 1 MG tablet Take 1 tablet (1 mg total) by mouth daily. 30 tablet 11   ??? oxyCODONE (ROXICODONE) 5 MG immediate release tablet Take 1 tablet (5 mg total) by mouth every eight (8) hours as needed for pain for up to 5 doses. 5 tablet 0   ??? OXYGEN-AIR DELIVERY SYSTEMS MISC 2  L/min by Each Nare route nightly. [employer]     ??? phenytoin (DILANTIN) 100 MG ER capsule Take 2 capsules (200 mg total) by mouth Two (2) times a day. 120 capsule 11   ??? syringe with needle (BD TUBERCULIN SYRINGE) 1 mL 25 gauge x 5/8 Syrg Use as directed Every Tuesday and Friday. 8 each 2   ??? nystatin-triamcinolone (MYCOLOG) 100,000-0.1 unit/gram-% ointment Apply topically Two (2) times a day. (Patient not taking: Reported on 06/26/2019) 30 g 0   ??? promethazine (PHENERGAN) 12.5 MG tablet Take 12.5 mg by mouth every six (6) hours as needed for nausea.     ??? sodium polystyrene (KAYEXALATE) powder Take 15 g by mouth continuous as needed (with high potassium food (e.g. banana)).       No current facility-administered medications for this visit.      Allergies:  Patient has no known allergies.    Social History:  Social History     Socioeconomic History   ??? Marital status: Single     Spouse name: None   ??? Number of children: 0   ??? Years of education: None   ??? Highest education level: None   Occupational History   ??? Occupation: disability   Social Needs   ??? Financial resource strain: None   ??? Food insecurity     Worry: None     Inability: None   ??? Transportation needs     Medical: None     Non-medical: None   Tobacco Use   ??? Smoking status: Current Some Day Smoker     Packs/day: 1.00     Years: 0.50     Pack years: 0.50 Types: Cigars   ??? Smokeless tobacco: Never Used   ??? Tobacco comment: Pt smokes 1-2 cigars daily, Pt intends to cease tobacco use    Substance and Sexual Activity   ??? Alcohol use: Not Currently     Alcohol/week: 0.0 standard drinks     Drinks per session: 1 or 2     Binge frequency: Never     Comment: occasional   ??? Drug use: No   ??? Sexual activity: None   Lifestyle   ??? Physical activity     Days per week: None     Minutes per session: None   ??? Stress: None   Relationships   ??? Social Wellsite geologist on phone: None     Gets together: None     Attends religious service: None     Active member of club or organization: None     Attends meetings of clubs or organizations: None     Relationship status: None   Other Topics Concern   ??? None   Social History Narrative   ??? None     Family History:  family history includes Clotting disorder in his paternal grandmother; Hypertension in his father and mother; No Known Problems in his brother, brother, brother, brother, maternal aunt, maternal grandfather, maternal grandmother, maternal uncle, paternal aunt, paternal grandfather, paternal uncle, sister, sister, and another family member; Sickle cell anemia in his sister. He indicated that his mother is alive. He indicated that his father is alive. He indicated that two of his three sisters are alive. He indicated that all of his four brothers are alive. He indicated that the status of his maternal grandmother is unknown. He indicated that the status of his maternal grandfather is unknown. He indicated that the status of his paternal grandmother is unknown.  He indicated that the status of his paternal grandfather is unknown. He indicated that the status of his maternal aunt is unknown. He indicated that the status of his maternal uncle is unknown. He indicated that the status of his paternal aunt is unknown. He indicated that the status of his paternal uncle is unknown. He indicated that the status of his neg hx is unknown. He indicated that the status of his other is unknown.    Review of Systems:  As per HPI, otherwise negative x 10 systems.    Objective :   Vitals:    06/27/19 1256   BP: 116/67   Pulse: 92   Temp: 37.1 ??C (98.8 ??F)   SpO2: 93%   Weight: 61.6 kg (135 lb 14.4 oz)   Height: 170.2 cm (5' 7)     Physical Exam:  Gen: Pleasant, well appearing in NAD  Eyes: Marked scleral icterus  CV: RRR, no murmur heard today  Pulm: Lungs clear to auscultation in all fields.  GI - abdomen nondistended, soft, non-tender  Skin - no lesions noted on visible skin  Ext: Notable Clubbing in bilateral fingers  Neuro - Alert and oriented to conversation, cranial nerves intact, grossly nonfocal.  Psych - Normal affect  MSK - no joint swelling or effusions noted    MOCA self assessment was 37/40.     Test Results:  Results for orders placed or performed in visit on 06/27/19   Pro-BNP   Result Value Ref Range    PRO-BNP 762.0 (H) 0.0 - 138.0 pg/mL   Lactate dehydrogenase   Result Value Ref Range    LDH 4,163 (H) 338 - 610 U/L   Comprehensive Metabolic Panel   Result Value Ref Range    Sodium 137 135 - 145 mmol/L    Potassium 6.3 (HH) 3.5 - 5.0 mmol/L    Chloride 108 (H) 98 - 107 mmol/L Anion Gap 6 (L) 7 - 15 mmol/L    CO2 23.0 22.0 - 30.0 mmol/L    BUN 28 (H) 7 - 21 mg/dL    Creatinine 1.61 0.96 - 1.30 mg/dL    BUN/Creatinine Ratio 30     EGFR CKD-EPI Non-African American, Male >90 >=60 mL/min/1.15m2    EGFR CKD-EPI African American, Male >90 >=60 mL/min/1.36m2    Glucose 81 70 - 179 mg/dL    Calcium 8.9 8.5 - 04.5 mg/dL    Albumin 4.0 3.5 - 5.0 g/dL    Total Protein 8.7 (H) 6.5 - 8.3 g/dL    Total Bilirubin 7.1 (H) 0.0 - 1.2 mg/dL    AST 409 (H) 17 - 47 U/L    ALT 32 <50 U/L    Alkaline Phosphatase 375 (H) 38 - 126 U/L   Gamma GT (GGT)   Result Value Ref Range    GGT 564 (H) 12 - 109 U/L   CBC w/ Differential   Result Value Ref Range    WBC 6.9 4.5 - 11.0 10*9/L    RBC 1.66 (L) 4.50 - 5.90 10*12/L    HGB 6.1 (LL) 13.5 - 17.5 g/dL    HCT 81.1 (LL) 91.4 - 53.0 %    MCV 109.9 (H) 80.0 - 100.0 fL    MCH 36.7 (H) 26.0 - 34.0 pg    MCHC 33.4 31.0 - 37.0 g/dL    RDW 78.2 (H) 95.6 - 15.0 %    MPV 8.9 7.0 - 10.0 fL    Platelet 178 150 - 440 10*9/L  nRBC 11 (H) <=4 /100 WBCs    Variable HGB Concentration Moderate (A) Not Present    Neutrophils % 33.6 %    Lymphocytes % 41.5 %    Monocytes % 19.5 %    Eosinophils % 1.1 %    Basophils % 0.5 %    Absolute Neutrophils 2.3 2.0 - 7.5 10*9/L    Absolute Lymphocytes 2.9 1.5 - 5.0 10*9/L    Absolute Monocytes 1.4 (H) 0.2 - 0.8 10*9/L    Absolute Eosinophils 0.1 0.0 - 0.4 10*9/L    Absolute Basophils 0.0 0.0 - 0.1 10*9/L    Large Unstained Cells 4 0 - 4 %    Macrocytosis Marked (A) Not Present    Anisocytosis Moderate (A) Not Present    Hypochromasia Moderate (A) Not Present   Morphology Review   Result Value Ref Range    Smear Review Comments See Comment (A) Undefined    Giant Platelets Present (A) Not Present    Polychromasia Slight (A) Not Present    Poikilocytosis Moderate (A) Not Present     Pt was seen and staffed with Dr. Nigel Berthold.    Teresa Pelton, MD  Fellow, PGY-7, Medicine Hematology & Pediatric Hematology/Oncology  06/27/19 1:19 PM

## 2019-06-28 ENCOUNTER — Emergency Department: Admit: 2019-06-28 | Discharge: 2019-06-29 | Disposition: A | Discharge status: Home / Self Care | Payer: MEDICARE

## 2019-06-28 ENCOUNTER — Ambulatory Visit: Admit: 2019-06-28 | Discharge: 2019-06-29 | Disposition: A | Discharge status: Home / Self Care | Payer: MEDICARE

## 2019-06-28 DIAGNOSIS — E875 Hyperkalemia: Principal | ICD-10-CM

## 2019-06-28 DIAGNOSIS — D571 Sickle-cell disease without crisis: Principal | ICD-10-CM

## 2019-06-28 DIAGNOSIS — S92514A Nondisplaced fracture of proximal phalanx of right lesser toe(s), initial encounter for closed fracture: Principal | ICD-10-CM

## 2019-06-28 LAB — CBC W/ AUTO DIFF
HEMATOCRIT: 17.9 % — ABNORMAL LOW (ref 41.0–53.0)
HEMOGLOBIN: 6.4 g/dL — ABNORMAL LOW (ref 13.5–17.5)
LARGE UNSTAINED CELLS: 5 % — ABNORMAL HIGH (ref 0–4)
LYMPHOCYTES RELATIVE PERCENT: 40.1 %
MEAN CORPUSCULAR HEMOGLOBIN CONC: 36.1 g/dL (ref 31.0–37.0)
MEAN CORPUSCULAR HEMOGLOBIN: 38.1 pg — ABNORMAL HIGH (ref 26.0–34.0)
MEAN CORPUSCULAR VOLUME: 105.5 fL — ABNORMAL HIGH (ref 80.0–100.0)
MEAN PLATELET VOLUME: 8.8 fL (ref 7.0–10.0)
NEUTROPHILS RELATIVE PERCENT: 34.9 %
PLATELET COUNT: 188 10*9/L (ref 150–440)
RED BLOOD CELL COUNT: 1.69 10*12/L — ABNORMAL LOW (ref 4.50–5.90)
RED CELL DISTRIBUTION WIDTH: 20.4 % — ABNORMAL HIGH (ref 12.0–15.0)
WBC ADJUSTED: 6.2 10*9/L (ref 4.5–11.0)

## 2019-06-28 LAB — COMPREHENSIVE METABOLIC PANEL
ALKALINE PHOSPHATASE: 351 U/L — ABNORMAL HIGH (ref 38–126)
ALT (SGPT): 38 U/L (ref <50)
ANION GAP: 14 mmol/L (ref 7–15)
BILIRUBIN TOTAL: 7.2 mg/dL — ABNORMAL HIGH (ref 0.0–1.2)
BLOOD UREA NITROGEN: 27 mg/dL — ABNORMAL HIGH (ref 7–21)
BUN / CREAT RATIO: 31
CALCIUM: 9.2 mg/dL (ref 8.5–10.2)
CHLORIDE: 112 mmol/L — ABNORMAL HIGH (ref 98–107)
CO2: 14 mmol/L — ABNORMAL LOW (ref 22.0–30.0)
CREATININE: 0.87 mg/dL (ref 0.70–1.30)
EGFR CKD-EPI AA MALE: >90 mL/min/{1.73_m2} (ref >=60)
EGFR CKD-EPI NON-AA MALE: >90 mL/min/{1.73_m2} (ref >=60)
GLUCOSE RANDOM: 94 mg/dL (ref 70–179)
POTASSIUM: 6.8 mmol/L (ref 3.5–5.0)
PROTEIN TOTAL: 10 g/dL — ABNORMAL HIGH (ref 6.5–8.3)
SODIUM: 140 mmol/L (ref 135–145)

## 2019-06-28 LAB — BASIC METABOLIC PANEL
BLOOD UREA NITROGEN: 25 mg/dL — ABNORMAL HIGH (ref 7–21)
BUN / CREAT RATIO: 31
CALCIUM: 9 mg/dL (ref 8.5–10.2)
CO2: 19 mmol/L — ABNORMAL LOW (ref 22.0–30.0)
CREATININE: 0.8 mg/dL (ref 0.70–1.30)
EGFR CKD-EPI NON-AA MALE: >90 mL/min/{1.73_m2} (ref >=60)
GLUCOSE RANDOM: 87 mg/dL (ref 70–179)
POTASSIUM: 5.2 mmol/L — ABNORMAL HIGH (ref 3.5–5.0)
SODIUM: 139 mmol/L (ref 135–145)

## 2019-06-28 LAB — PHOSPHORUS: Phosphate:MCnc:Pt:Ser/Plas:Qn:: 4.3

## 2019-06-28 LAB — SLIDE REVIEW

## 2019-06-28 LAB — ALBUMIN: Albumin:MCnc:Pt:Ser/Plas:Qn:: 4.7

## 2019-06-28 LAB — POLYCHROMASIA

## 2019-06-28 LAB — RED CELL DISTRIBUTION WIDTH: Lab: 20.4 — ABNORMAL HIGH

## 2019-06-28 LAB — POTASSIUM: Potassium:SCnc:Pt:Ser/Plas:Qn:: 5.2 — ABNORMAL HIGH

## 2019-06-28 LAB — MAGNESIUM: Magnesium:MCnc:Pt:Ser/Plas:Qn:: 1.9

## 2019-06-28 MED ORDER — SYRINGE WITH NEEDLE 1 ML 27 X 1/2"
3 refills | 0 days | Status: CP
Start: 2019-06-28 — End: ?

## 2019-06-28 NOTE — Unmapped (Signed)
Pt sent to ED by PCP for elevated potassium.  Also c/o right 5th toe pain and swelling x 1 day.

## 2019-06-28 NOTE — Unmapped (Signed)
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 male with a PMH of sickle cell anemia, HTN, and seizures who presents for evaluation of hyperkalemia of 6.3 found yesterday at routine hematology appointment.  Patient denies any significant symptoms from his baseline, no chest pain, shortness of breath.  He does state that today he has had intermittent lightheadedness 2 times.  No recent fevers or other systemic signs or symptoms.  He also notes pain to the right foot at the fifth digit without any known injury.    On exam, the patient is comfortable appearing and in NAD. His vitals are WNL and he is afebrile. HRRR, known systolic murmur is auscultated, lungs CTAB, and normal work of breathing. Abdomen soft, nontender, and nondistended.  Sclera are slightly icteric.    Plan for EKG and labs.  Will obtain x-ray of the toe.    4:05 PM  Hyperkalemia to 6.8 noted, ongoing transaminase, similar to previous.  H/H 6.4/17.9, improved from previous on file.  EKG with slight peaked T waves noted in V2 through V4 otherwise no significant changes.  Nondisplaced proximal phalanx fracture as noted per x-ray.  For hyperkalemia will give IVF, albuterol, dextrose, insulin, and calcium and recheck BMP after IVF completed.  Patient updated on plan.    7:26 PM  Patient requesting home dosing of Tegretol and Dilantin so he does not miss a dose while here.  Medication list reviewed and these meds were ordered per his request.  BMP is just now are being redrawn.  ??  8:09 PM Repeat BMP significantly improved with potassium of 5.2.  Patient instructed to take his Kayexalate as prescribed, increase hydration.    Instructed to make a follow-up appointment with his hematologist within the next week for reevaluation and to discuss today's visit.  Buddy tape provided to the toe and patient instructed to ice intermittently, OTC meds for pain control.  Rehab exercises provided and Ortho contact information given if pain not significantly improved within the next couple of weeks with symptomatic care.  Strict reevaluation criteria were discussed and patient verbalized understanding and agreeable to discharge.     Additional Medical Decision Making     I have reviewed the vital signs and the nursing notes. Labs and radiology results that were available during my care of the patient were independently reviewed by me and considered in my medical decision making.     I directly visualized and independently interpreted the EKG tracing.   I reviewed the patient's prior medical records (hematology note from 06/27/2019).   I independently visualized the radiology images.     Portions of this record have been created using Scientist, clinical (histocompatibility and immunogenetics). Dictation errors have been sought, but may not have been identified and corrected.  ____________________________________________         History        Chief Complaint  Abnormal Lab      HPI [name] [name] is a 48 y.o. male with a PMH of sickle cell anemia, HTN, hyperkalemia, hypertension, transaminase and seizures who presents for an abnormal lab. Per  chart review, the patient had a routine appointment with hematology yesterday (11/04) where labs were significant for potassium of 6.3, AST of 158, and alkaline phosphatase of 375. He was restarted on kayexalate, but it was felt that this would not lower his potassium quickly enough so he was referred to the ED. He was also found to have O2 sats of 93%. For this, he was recommended to wear 2-4 L oxygen at night, although it was suspected that patient has been non-compliant with this as it causes him to lose sleep.    The patient endorses some fatigue that began this morning but otherwise feels at baseline. He denies chest pain, shortness of breath, headache, vision changes, abdominal pain.  He does verbalize that he has had intermittent lightheadedness today which has been to be positional in nature, denies any sensation of the room spinning or focal neurological deficits.  He most recently took his kayexalate last night, but does not like taking this as it causes frequent stools.  Had approximately 5 nonbloody stools last night after taking his Kayexalate.     He also mentions about 24 hours of R-sided 5th toe pain that he rates as a 5/10 upon ambulation. He denies known injury of toe.  He notes mild swelling without any increased warmth to the touch or erythema.  Patient does state that he has a history of gout and is unsure if this is gout related.  Furthermore denies any recent fever, cough/congestion, chest pain, shortness of breath,n/v/d.  No recent Covid symptoms or Covid exposure hence Covid not suspected at this time.    Past Medical History:   Diagnosis Date   ??? Anemia    ??? Foot pain    ??? 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 Hypertension    ??? Pulmonary hypertension (CMS-HCC)    ??? Seizures (CMS-HCC) ??? Sickle cell anemia (CMS-HCC)    ??? Vitamin D deficiency        Patient Active Problem List   Diagnosis   ??? Hyperpotassemia   ??? 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 Sickle cell anemia (CMS-HCC)   ??? Hyperkalemia   ??? Proteinuria   ??? Vitamin D deficiency   ??? Seizure disorder (CMS-HCC)   ??? Pain of left hip joint   ??? Ear infection   ??? Sickle cell crisis (CMS-HCC)   ??? Pulmonary HTN (CMS-HCC)   ??? Anemia   ??? Edema   ??? Hypoxemia   ??? Tobacco use disorder   ??? High output heart failure (CMS-HCC)   ??? Vertigo   ??? Hyperbilirubinemia   ??? Nausea & vomiting       Past Surgical History:   Procedure Laterality Date   ??? PR RIGHT HEART CATH O2 SATURATION & CARDIAC OUTPUT N/A 07/18/2017    Procedure: Right Heart Catheterization;  Surgeon: Zannie Cove, MD;  Location: [facility] CATH;  Service: Cardiology       No current facility-administered medications for this encounter.     Current Outpatient Medications:   ???  acetaminophen (TYLENOL 8 HOUR) 650 MG CR tablet, Take 650 mg by mouth every eight (8) hours as needed for pain., Disp: , Rfl:   ???  carBAMazepine (TEGRETOL  XR) 400 MG 12 hr tablet, Take 2 tablets (800 mg total) by mouth Two (2) times a day., Disp: 120 tablet, Rfl: 11  ???  cholecalciferol, vitamin D3, (VITAMIN D3) 10 mcg (400 unit) cap, Take 1 capsule (400 Units total) by  mouth daily., Disp: 30 each, Rfl: 0  ???  diclofenac sodium (VOLTAREN) 1 % gel, Apply 2 g topically daily as needed for arthritis., Disp: 100 g, Rfl: prn  ???  empty container (SHARPS CONTAINER) Misc, use as directed, Disp: 1 each, Rfl: 2  ???  epoetin alfa (EPOGEN,PROCRIT) 40,000 unit/mL injection, Inject 1 mL (40,000 Units total) under the skin Two (2) times a week., Disp: 8 mL, Rfl: 3  ???  folic acid (FOLVITE) 1 MG tablet, Take 1 tablet (1 mg total) by mouth daily., Disp: 30 tablet, Rfl: 11  ???  hydroxyurea (HYDREA) 500 mg capsule, Take 1 capsule (500 mg total) by mouth daily., Disp: 30 capsule, Rfl: 0 ???  nystatin-triamcinolone (MYCOLOG) 100,000-0.1 unit/gram-% ointment, Apply topically Two (2) times a day. (Patient not taking: Reported on 06/26/2019), Disp: 30 g, Rfl: 0  ???  oxyCODONE (ROXICODONE) 5 MG immediate release tablet, Take 1 tablet (5 mg total) by mouth every eight (8) hours as needed for pain for up to 5 doses., Disp: 5 tablet, Rfl: 0  ???  OXYGEN-AIR DELIVERY SYSTEMS MISC, 2 L/min by Each Nare route nightly. [employer], Disp: , Rfl:   ???  phenytoin (DILANTIN) 100 MG ER capsule, Take 2 capsules (200 mg total) by mouth Two (2) times a day., Disp: 120 capsule, Rfl: 11  ???  promethazine (PHENERGAN) 12.5 MG tablet, Take 12.5 mg by mouth every six (6) hours as needed for nausea., Disp: , Rfl:   ???  sodium polystyrene (KAYEXALATE) powder, Take 15 g by mouth continuous as needed (with high potassium food (e.g. banana))., Disp: , Rfl:   ???  syringe with needle (BD TUBERCULIN SYRINGE) 1 mL 25 gauge x 5/8 Syrg, Use as directed Every Tuesday and Friday., Disp: 8 each, Rfl: 2  ???  insulin syringe-needle U-100 (TRUEPLUS INSULIN) 1 mL 29 gauge x 1/2 (12 mm) Syrg, 1 each by Miscellaneous route Two (2) times a week., Disp: 24 each, Rfl: 3    Allergies  Patient has no known allergies.    Family History   Problem Relation Age of Onset   ??? Hypertension Mother    ??? Hypertension Father    ??? No Known Problems Sister    ??? No Known Problems Brother    ??? No Known Problems Brother    ??? No Known Problems Brother    ??? No Known Problems Brother    ??? Sickle cell anemia Sister    ??? No Known Problems Sister    ??? Clotting disorder Paternal Grandmother    ??? No Known Problems Maternal Aunt    ??? No Known Problems Maternal Uncle    ??? No Known Problems Paternal Aunt    ??? No Known Problems Paternal Uncle    ??? No Known Problems Maternal Grandmother    ??? No Known Problems Maternal Grandfather    ??? No Known Problems Paternal Grandfather    ??? No Known Problems Other    ??? Seizures Neg Hx    ??? Pulmonary Hypertension Neg Hx    ??? Autoimmune disease Neg Hx ??? Venous thrombosis Neg Hx    ??? Amblyopia Neg Hx    ??? Blindness Neg Hx    ??? Cancer Neg Hx    ??? Cataracts Neg Hx    ??? Diabetes Neg Hx    ??? Glaucoma Neg Hx    ??? Macular degeneration Neg Hx    ??? Retinal detachment Neg Hx    ??? Strabismus Neg Hx    ???  Stroke Neg Hx    ??? Thyroid disease Neg Hx        Social History  Social History     Tobacco Use   ??? Smoking status: Current Some Day Smoker     Packs/day: 1.00     Years: 0.50     Pack years: 0.50     Types: Cigars   ??? Smokeless tobacco: Never Used   ??? Tobacco comment: Pt smokes 1-2 cigars daily, Pt intends to cease tobacco use    Substance Use Topics   ??? Alcohol use: Not Currently     Alcohol/week: 0.0 standard drinks     Drinks per session: 1 or 2     Binge frequency: Never     Comment: occasional   ??? Drug use: No       Review of Systems  Constitutional: Positive for fatigue. Negative for fever.  Eyes: Negative for visual changes.  ENT: Negative for sore throat.  Cardiovascular: Negative for chest pain.  Respiratory: Negative for shortness of breath or cough/congestion.  Gastrointestinal: Negative for abdominal pain, nausea/vomiting or diarrhea.  Genitourinary: Negative for dysuria or hematuria.   Musculoskeletal: Positive for R 5th toe pain. Negative for back pain.  Skin: Negative for rash.  Neurological: Negative for headaches, focal weakness or numbness.  Positive for intermittent lightheadedness.     Physical Exam     ED Triage Vitals   Enc Vitals Group      BP 06/28/19 1349 131/57      Pulse --       SpO2 Pulse 06/28/19 1349 63      Resp 06/28/19 1349 18      Temp 06/28/19 1349 36.7 ??C (98.1 ??F)      Temp Source 06/28/19 1349 Oral      SpO2 06/28/19 1349 100 %      Weight 06/28/19 1357 66.2 kg (146 lb)     Constitutional: Alert and oriented. Well appearing and in no distress.  Eyes: Conjunctivae are icteric.  ENT       Head: Normocephalic and atraumatic.       Nose: No congestion.       Mouth/Throat: Mucous membranes are moist.       Neck: No stridor. Cardiovascular: Normal rate, regular rhythm.  Systolic murmur 3/6 auscultated loudest at the left fifth ICS.  Normal and symmetric distal pulses are present in all extremities.  Respiratory: Normal respiratory effort. Breath sounds are normal in all lobes.  Gastrointestinal: Soft nondistended and nontender.   Musculoskeletal: Normal range of motion in all extremities.       Right lower leg: Mild TTP only to the right dorsal foot fifth digit.  No bony abnormality noted and patient has full range of motion.  No significant swelling.  Neurologic: Normal speech and language. No gross focal neurologic deficits are appreciated.  GCS 15.  Skin: Skin is warm, dry and intact. No rash noted.  Psychiatric: Mood and affect are normal. Speech and behavior are normal.     EKG     Normal rate, regular rhythm, 61 bpm  First-degree AV block previously noted  No axis deviation  Normal intervals excluding prolonged PR interval of 214, noted on previous EKG  T waves in leads V2 through V4 are peaked otherwise no significant ST elevation or depression from baseline     Radiology     XR Toes Right   Final Result   Nondisplaced proximal phalanx fracture.  Procedures     n/a       Documentation assistance was provided by Merwyn Katos, Scribe on June 28, 2019 at 3:11 PM for Rory Percy, NP.    A scribe was used when documenting this visit. I agree with the above documentation. Signed by  Chrissie Noa on  June 28, 2019 at 5:20 PM           Chrissie Noa, [place]  06/28/19 2108

## 2019-06-28 NOTE — Unmapped (Signed)
[facility] Specialty Medication Referral: No PA required    Medication (Brand/Generic): Hydroxyurea 500 mg capsules    Initial Benefits Investigation Claim completed with resulted information below:  No PA required  Patient ABLE to fill at [facility]  Insurance Company:  [place] and [place] Medicaid  Anticipated Copay: $0.00    As Co-pay is under $25 defined limit, per policy there will be no further investigation of need for financial assistance at this time unless patient requests. This referral has been communicated to the provider and handed off to the [facility] team for further processing and filling of prescribed medication.   ______________________________________________________________________  Please utilize this referral for viewing purposes as it will serve as the central location for all relevant documentation and updates.

## 2019-06-28 NOTE — Unmapped (Signed)
[facility]   Patient Onboarding/Medication Counseling    [name] [name] is a 48 y.o. male with sickle cell disease who I am counseling today on initiation of therapy.  I am speaking to the patient.    Verified patient's date of birth / HIPAA.    Specialty medication(s) to be sent: Hematology/Oncology: Hydroxyurea and Procrit      Non-specialty medications/supplies to be sent: syringes      Medications not needed at this time: none         Hydrea (hydroxyurea)     Medication & Administration     Dosage: Take 1 capsule (500 mg total) by mouth daily.    Administration: Take by mouth once daily at about the same time each day. Take without regard to meals with a full glass of water. Swallow the capsule whole and do not chew, open, break, or crush the capsules.    Adherence/Missed dose instructions: Take a missed dose as soon as you remember it. If it is close to the time of your next dose, skip the missed dose and resume your normal schedule.    Goals of Therapy     Reduce the frequency of pain crises and the need for blood transfusions in patients with sickle cell anemia    Side Effects & Monitoring Parameters   ? Upset stomach (nausea, vomiting)  ? Diarrhea/constipation  ? Fatigue   ? Loss of appetite  ? Headache  ? Mouth sores  ? Weight gain  ? Hair loss (not usually seen in HU doses)  ? Increased risk of infections  ? Increased risk of bleeding  ? Photosensitivity     The following side effects should be reported to the provider:  ? Signs of an allergic reaction  ? Skin changes  ? Shortness of breath, difficulty breathing, new or worsening cough  ? Heartbeat that doesn't feel normal (heart feels like it is racing, skipping a beat or fluttering)  ? Signs of infection  ? Excessive bruising, gums bleeding or nose bleeds  ? Yellowing of skin or eyes  ? Decrease in urination, change in color of urine, blood in urine or swelling in ankles ? Tumor Lysis Syndrome symptoms (excessive nausea, vomiting, diarrhea or lethargic)      Contraindications, Warnings, & Precautions     ? Bone marrow suppression (CBC should be monitored at baseline and periodically throughout treatment)   ? Mouth sores-discussed use of baking soda/salt rinses  ? Exposure of an unborn child to this medication could cause birth defects so you should not become pregnant or father a [place] while on this medication.  Effective birth control is necessary during treatment and for 6 months after treatment for women and 1 year for men.  ? Radiation recall- when there is a skin reaction (looks like a severe sunburn) in the areas where radiation was previously given    Drug/Food Interactions     ? Medication list reviewed in Epic. The patient was instructed to inform the care team before taking any new medications or supplements. No drug interactions identified.   ? Avoid use of live vaccines during therapy.    Storage, Handling Precautions, & Disposal   ? Hydroxyurea should be stored at room temperature  ? Compound hydroxyurea is stable for 60 days at room temperature  ? Hydroxyurea is considered a hazardous agent. Anyone other than the patient should wear gloves if handling.  o Wash hands thoroughly before and after handling  Current Medications (including OTC/herbals), Comorbidities and Allergies     Current Outpatient Medications   Medication Sig Dispense Refill   ??? acetaminophen (TYLENOL 8 HOUR) 650 MG CR tablet Take 650 mg by mouth every eight (8) hours as needed for pain.     ??? carBAMazepine (TEGRETOL  XR) 400 MG 12 hr tablet Take 2 tablets (800 mg total) by mouth Two (2) times a day. 120 tablet 11   ??? cholecalciferol, vitamin D3, (VITAMIN D3) 10 mcg (400 unit) cap Take 1 capsule (400 Units total) by mouth daily. 30 each 0   ??? diclofenac sodium (VOLTAREN) 1 % gel Apply 2 g topically daily as needed for arthritis. 100 g prn ??? empty container (SHARPS CONTAINER) Misc use as directed 1 each 2   ??? epoetin alfa (EPOGEN,PROCRIT) 40,000 unit/mL injection Inject 1 mL (40,000 Units total) under the skin Two (2) times a week. 8 mL 3   ??? folic acid (FOLVITE) 1 MG tablet Take 1 tablet (1 mg total) by mouth daily. 30 tablet 11   ??? hydroxyurea (HYDREA) 500 mg capsule Take 1 capsule (500 mg total) by mouth daily. 30 capsule 0   ??? nystatin-triamcinolone (MYCOLOG) 100,000-0.1 unit/gram-% ointment Apply topically Two (2) times a day. (Patient not taking: Reported on 06/26/2019) 30 g 0   ??? oxyCODONE (ROXICODONE) 5 MG immediate release tablet Take 1 tablet (5 mg total) by mouth every eight (8) hours as needed for pain for up to 5 doses. 5 tablet 0   ??? OXYGEN-AIR DELIVERY SYSTEMS MISC 2 L/min by Each Nare route nightly. [employer]     ??? phenytoin (DILANTIN) 100 MG ER capsule Take 2 capsules (200 mg total) by mouth Two (2) times a day. 120 capsule 11   ??? promethazine (PHENERGAN) 12.5 MG tablet Take 12.5 mg by mouth every six (6) hours as needed for nausea.     ??? sodium polystyrene (KAYEXALATE) powder Take 15 g by mouth continuous as needed (with high potassium food (e.g. banana)).     ??? syringe with needle (BD TUBERCULIN SYRINGE) 1 mL 25 gauge x 5/8 Syrg Use as directed Every Tuesday and Friday. 8 each 2   ??? syringe with needle 1 mL 27 x 1/2 Syrg Use 1 each Two (2) times a week as directed. 24 each 3     No current facility-administered medications for this visit.        No Known Allergies    Patient Active Problem List   Diagnosis   ??? Hyperpotassemia   ??? 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 Sickle cell anemia (CMS-HCC)   ??? Hyperkalemia   ??? Proteinuria   ??? Vitamin D deficiency   ??? Seizure disorder (CMS-HCC)   ??? Pain of left hip joint   ??? Ear infection   ??? Sickle cell crisis (CMS-HCC)   ??? Pulmonary HTN (CMS-HCC)   ??? Anemia   ??? Edema   ??? Hypoxemia   ??? Tobacco use disorder   ??? High output heart failure (CMS-HCC)   ??? Vertigo ??? Hyperbilirubinemia   ??? Nausea & vomiting       Reviewed and up to date in Epic.    Appropriateness of Therapy     Is medication and dose appropriate based on diagnosis? Yes    Baseline Quality of Life Assessment      How many days over the past month did your sickle cell diseae keep you from your normal activities? 0    Financial  Information     Medication Assistance provided: None Required    Anticipated copay of $0.00 reviewed with patient. Verified delivery address.    Delivery Information     Scheduled delivery date: 07/02/19 (hydroxyurea)  07/12/19 (Procrit/syringe)    Expected start date: 07/02/19    Medication will be delivered via Same Day Courier to the prescription address in [facility].  This shipment will not require a signature.      Explained the services we provide at [facility] and that each month we would call to set up refills.  Stressed importance of returning phone calls so that we could ensure they receive their medications in time each month.  Informed patient that we should be setting up refills 7-10 days prior to when they will run out of medication.  A pharmacist will reach out to perform a clinical assessment periodically.  Informed patient that a welcome packet and a drug information handout will be sent.      Patient verbalized understanding of the above information as well as how to contact the pharmacy at [phone] option 4 with any questions/concerns.  The pharmacy is open Monday through Friday 8:30am-4:30pm.  A pharmacist is available 24/7 via pager to answer any clinical questions they may have.    Patient Specific Needs     ? Does the patient have any physical, cognitive, or cultural barriers? No    ? Patient prefers to have medications discussed with  Patient     ? Is the patient able to read and understand education materials at a high school level or above? Yes    ? Patient's primary language is  English     ? Is the patient high risk? No ? Does the patient require a Care Management Plan? No     ? Does the patient require physician intervention or other additional services (i.e. nutrition, smoking cessation, social work)? No      Genessis Flanary A [facility] Specialty Pharmacist

## 2019-06-28 NOTE — Unmapped (Signed)
Potassium 6.2. Dr. Andrey Campanile called him last night. I called to f/u, and could not 'raise' him.    Will ask CP2 to f/u, and long term will increase his kayexelate to daily.

## 2019-06-30 ENCOUNTER — Emergency Department: Payer: Medicare Other

## 2019-06-30 ENCOUNTER — Other Ambulatory Visit: Payer: Self-pay

## 2019-06-30 ENCOUNTER — Encounter: Payer: Self-pay | Admitting: Emergency Medicine

## 2019-06-30 ENCOUNTER — Emergency Department
Admission: EM | Admit: 2019-06-30 | Discharge: 2019-06-30 | Disposition: A | Discharge status: Home / Self Care | Payer: Medicare Other | Attending: Emergency Medicine | Admitting: Emergency Medicine

## 2019-06-30 DIAGNOSIS — D5700 Hb-SS disease with crisis, unspecified: Secondary | ICD-10-CM

## 2019-06-30 DIAGNOSIS — F17200 Nicotine dependence, unspecified, uncomplicated: Secondary | ICD-10-CM | POA: Diagnosis not present

## 2019-06-30 DIAGNOSIS — R1011 Right upper quadrant pain: Secondary | ICD-10-CM | POA: Diagnosis not present

## 2019-06-30 DIAGNOSIS — R1084 Generalized abdominal pain: Secondary | ICD-10-CM | POA: Insufficient documentation

## 2019-06-30 DIAGNOSIS — R110 Nausea: Secondary | ICD-10-CM | POA: Diagnosis not present

## 2019-06-30 HISTORY — DX: Unspecified convulsions: R56.9

## 2019-06-30 LAB — RETICULOCYTES
Immature Retic Fract: 29 % — ABNORMAL HIGH (ref 2.3–15.9)
RBC.: 1.4 MIL/uL — ABNORMAL LOW (ref 4.22–5.81)
Retic Count, Absolute: 241.6 10*3/uL — ABNORMAL HIGH (ref 19.0–186.0)
Retic Ct Pct: 17.3 % — ABNORMAL HIGH (ref 0.4–3.1)

## 2019-06-30 LAB — CBC WITH DIFFERENTIAL/PLATELET
Abs Immature Granulocytes: 0.03 10*3/uL (ref 0.00–0.07)
Basophils Absolute: 0.1 10*3/uL (ref 0.0–0.1)
Basophils Relative: 1 %
Eosinophils Absolute: 0.2 10*3/uL (ref 0.0–0.5)
Eosinophils Relative: 2 %
HCT: 15.1 % — ABNORMAL LOW (ref 39.0–52.0)
Hemoglobin: 5.6 g/dL — ABNORMAL LOW (ref 13.0–17.0)
Immature Granulocytes: 0 %
Lymphocytes Relative: 40 %
Lymphs Abs: 3.5 10*3/uL (ref 0.7–4.0)
MCH: 35.4 pg — ABNORMAL HIGH (ref 26.0–34.0)
MCHC: 37.1 g/dL — ABNORMAL HIGH (ref 30.0–36.0)
MCV: 95.6 fL (ref 80.0–100.0)
Monocytes Absolute: 2.5 10*3/uL — ABNORMAL HIGH (ref 0.1–1.0)
Monocytes Relative: 28 %
Neutro Abs: 2.6 10*3/uL (ref 1.7–7.7)
Neutrophils Relative %: 29 %
Platelets: 188 10*3/uL (ref 150–400)
RBC: 1.58 MIL/uL — ABNORMAL LOW (ref 4.22–5.81)
RDW: 22.1 % — ABNORMAL HIGH (ref 11.5–15.5)
WBC: 8.9 10*3/uL (ref 4.0–10.5)
nRBC: 0.6 % — ABNORMAL HIGH (ref 0.0–0.2)

## 2019-06-30 LAB — COMPREHENSIVE METABOLIC PANEL
ALT: 29 U/L (ref 0–44)
AST: 124 U/L — ABNORMAL HIGH (ref 15–41)
Albumin: 3.5 g/dL (ref 3.5–5.0)
Alkaline Phosphatase: 317 U/L — ABNORMAL HIGH (ref 38–126)
Anion gap: 7 (ref 5–15)
BUN: 26 mg/dL — ABNORMAL HIGH (ref 6–20)
CO2: 22 mmol/L (ref 22–32)
Calcium: 9.1 mg/dL (ref 8.9–10.3)
Chloride: 109 mmol/L (ref 98–111)
Creatinine, Ser: 0.83 mg/dL (ref 0.61–1.24)
GFR calc Af Amer: >60 mL/min (ref >60)
GFR calc non Af Amer: >60 mL/min (ref >60)
Glucose, Bld: 90 mg/dL (ref 70–99)
Potassium: 5.4 mmol/L — ABNORMAL HIGH (ref 3.5–5.1)
Sodium: 138 mmol/L (ref 135–145)
Total Bilirubin: 5.6 mg/dL — ABNORMAL HIGH (ref 0.3–1.2)
Total Protein: 8.3 g/dL — ABNORMAL HIGH (ref 6.5–8.1)

## 2019-06-30 LAB — CBC
Hemoglobin: 5.6 g/dL — ABNORMAL LOW (ref 13.0–17.0)
Platelets: 188 10*3/uL (ref 150–400)
WBC: 9.8 10*3/uL (ref 4.0–10.5)

## 2019-06-30 LAB — PROTIME-INR
INR: 1.4 — ABNORMAL HIGH (ref 0.8–1.2)
Prothrombin Time: 16.7 seconds — ABNORMAL HIGH (ref 11.4–15.2)

## 2019-06-30 LAB — TYPE AND SCREEN
ABO/RH(D): B POS
Antibody Screen: NEGATIVE

## 2019-06-30 LAB — PHENYTOIN LEVEL, TOTAL: Phenytoin Lvl: 14.9 ug/mL (ref 10.0–20.0)

## 2019-06-30 LAB — ETHANOL: Alcohol, Ethyl (B): <10 mg/dL (ref <10)

## 2019-06-30 LAB — CARBAMAZEPINE LEVEL, TOTAL: Carbamazepine Lvl: 4.5 ug/mL (ref 4.0–12.0)

## 2019-06-30 LAB — LIPASE, BLOOD: Lipase: 75 U/L — ABNORMAL HIGH (ref 11–51)

## 2019-06-30 LAB — LACTATE DEHYDROGENASE: LDH: 1304 U/L — ABNORMAL HIGH (ref 98–192)

## 2019-06-30 MED ORDER — FAMOTIDINE 20 MG PO TABS: 20.0000 mg | ORAL_TABLET | Freq: Two times a day (BID) | ORAL | 0 refills | Status: AC

## 2019-06-30 MED ORDER — ONDANSETRON HCL 4 MG/2ML IJ SOLN
4.0000 mg | Freq: Once | INTRAMUSCULAR | Status: AC
  Administered 2019-06-30: 08:00:00 4 mg via INTRAVENOUS
  Filled 2019-06-30: qty 2

## 2019-06-30 MED ORDER — ONDANSETRON 4 MG PO TBDP: 4.0000 mg | ORAL_TABLET | Freq: Three times a day (TID) | ORAL | 0 refills | Status: DC | PRN

## 2019-06-30 MED ORDER — SODIUM CHLORIDE 0.9 % IV BOLUS
1000.0000 mL | Freq: Once | INTRAVENOUS | Status: AC
  Administered 2019-06-30: 1000 mL via INTRAVENOUS

## 2019-06-30 MED ORDER — IOHEXOL 9 MG/ML PO SOLN
500.0000 mL | Freq: Once | ORAL | Status: DC | PRN
  Administered 2019-06-30: 500 mL via ORAL
  Filled 2019-06-30: qty 500

## 2019-06-30 MED ORDER — IOHEXOL 300 MG/ML  SOLN
100.0000 mL | Freq: Once | INTRAMUSCULAR | Status: AC | PRN
  Administered 2019-06-30: 09:00:00 100 mL via INTRAVENOUS

## 2019-06-30 MED ORDER — MORPHINE SULFATE (PF) 4 MG/ML IV SOLN
4.0000 mg | Freq: Once | INTRAVENOUS | Status: AC
  Administered 2019-06-30: 4 mg via INTRAVENOUS
  Filled 2019-06-30: qty 1

## 2019-06-30 NOTE — ED Provider Notes (Addendum)
[facility] Emergency Department Provider Note   ____________________________________________   First MD Initiated Contact with Patient 06/30/19 0606     (approximate)  I have reviewed the triage vital signs and the nursing notes.   HISTORY  Chief Complaint Abdominal Pain    HPI Andrew Wilkinson is a 48 y.o. male who presents to the ED via EMS from home with a chief complaint of abdominal pain and nausea.  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  History of intolerance to Voxelator, poorly tolerated hydroxyurea, does not reliably take epoetin.  Reports generalized abdominal pain with associated nausea which started around 1:45 AM while awake.  Describes pain is nonradiating and dull.  Denies fever, cough, chest pain, shortness of breath, vomiting, dysuria, diarrhea.  Patient was seen recently at [facility] hematology clinic and sent to their ED for hyperkalemia with potassium of 6.8.  He was restarted on Kayexalate with repeat potassium of 5.2.  History of 6 transfusions previously, last in July 2019 but patient states he would never take another blood transfusion.  Denies recent travel or trauma.      Past Medical History:  Diagnosis Date   Seizures (HCC)     There are no active problems to display for this patient.   History reviewed. No pertinent surgical history.  Prior to Admission medications   Not on File    Allergies Patient has no known allergies.  No family history on file.  Social History Social History   Tobacco Use   Smoking status: Current Every Day Smoker   Smokeless tobacco: Never Used  Substance Use Topics   Alcohol use: Not Currently   Drug use: Never    Review of Systems  Constitutional: No fever/chills Eyes: No visual changes. ENT: No sore throat. Cardiovascular: Denies chest pain. Respiratory: Denies shortness of breath. Gastrointestinal: Positive for generalized abdominal pain and  nausea, no vomiting.  No diarrhea.  No constipation. Genitourinary: Negative for dysuria. Musculoskeletal: Negative for back pain. Skin: Negative for rash. Neurological: Negative for headaches, focal weakness or numbness.   ____________________________________________   PHYSICAL EXAM:  VITAL SIGNS: ED Triage Vitals  Enc Vitals Group     BP 06/30/19 0237 118/62     Pulse Rate 06/30/19 0237 (!) 59     Resp 06/30/19 0237 20     Temp 06/30/19 0237 97.9 F (36.6 C)     Temp Source 06/30/19 0237 Oral     SpO2 06/30/19 0237 94 %     Weight 06/30/19 0238 150 lb (68 kg)     Height 06/30/19 0238 5\' 6"  (1.676 m)     Head Circumference --      Peak Flow --      Pain Score 06/30/19 0238 5     Pain Loc --      Pain Edu? --      Excl. in GC? --     Constitutional: Alert and oriented.  Chronically ill appearing and in mild acute distress. Eyes: Scleral icterus. PERRL. EOMI. Head: Atraumatic. Nose: No congestion/rhinnorhea. Mouth/Throat: Mucous membranes are moist.  Oropharynx non-erythematous. Neck: No stridor.   Cardiovascular: Normal rate, regular rhythm. Grossly normal heart sounds.  Good peripheral circulation. Respiratory: Normal respiratory effort.  No retractions. Lungs CTAB. Gastrointestinal: Soft with generalized tenderness to palpation without rebound or guarding. No distention. No abdominal bruits. No CVA tenderness. Musculoskeletal: No lower extremity tenderness nor edema.  No joint effusions. Neurologic:  Normal speech and  language. No gross focal neurologic deficits are appreciated.  Skin:  Skin is warm, dry and intact. No rash noted. Psychiatric: Mood and affect are normal. Speech and behavior are normal.  ____________________________________________   LABS (all labs ordered are listed, but only abnormal results are displayed)  Labs Reviewed  LIPASE, BLOOD - Abnormal; Notable for the following components:      Result Value   Lipase 75 (*)    All other components  within normal limits  COMPREHENSIVE METABOLIC PANEL - Abnormal; Notable for the following components:   Potassium 5.4 (*)    BUN 26 (*)    Total Protein 8.3 (*)    AST 124 (*)    Alkaline Phosphatase 317 (*)    Total Bilirubin 5.6 (*)    All other components within normal limits  CBC - Abnormal; Notable for the following components:   Hemoglobin 5.6 (*)    All other components within normal limits  CBC WITH DIFFERENTIAL/PLATELET - Abnormal; Notable for the following components:   RBC 1.58 (*)    Hemoglobin 5.6 (*)    HCT 15.1 (*)    MCH 35.4 (*)    MCHC 37.1 (*)    RDW 22.1 (*)    nRBC 0.6 (*)    Monocytes Absolute 2.5 (*)    All other components within normal limits  URINALYSIS, COMPLETE (UACMP) WITH MICROSCOPIC  PROTIME-INR  RETICULOCYTES  LACTATE DEHYDROGENASE  PHENYTOIN LEVEL, TOTAL  CARBAMAZEPINE LEVEL, TOTAL  ETHANOL  URINE DRUG SCREEN, QUALITATIVE ([facility] ONLY)  URINALYSIS, COMPLETE (UACMP) WITH MICROSCOPIC  TYPE AND SCREEN   ____________________________________________  EKG  ED ECG REPORT I, Devota Viruet J, the attending physician, personally viewed and interpreted this ECG.   Date: 06/30/2019  EKG Time: 0638  Rate: 56  Rhythm: normal EKG, normal sinus rhythm  Axis: Normal  Intervals:none  ST&T Change: Nonspecific  ____________________________________________  RADIOLOGY  ED MD interpretation: Cholelithiasis without cholecystitis; CT pending  Official radiology report(s): US Abdomen Limited Ruq  Result Date: 06/30/2019 CLINICAL DATA:  Right upper quadrant pain beginning this morning. EXAM: ULTRASOUND ABDOMEN LIMITED RIGHT UPPER QUADRANT COMPARISON:  None. FINDINGS: Gallbladder: Moderate cholelithiasis with largest stone measuring 1.5 cm. No significant gallbladder wall thickening or adjacent free fluid. Negative sonographic Murphy sign. Common bile duct: Diameter: 5.0 mm. Liver: No focal lesion identified. Within normal limits in parenchymal echogenicity.  Portal vein is patent on color Doppler imaging with normal direction of blood flow towards the liver. Other: None. IMPRESSION: Moderate cholelithiasis without additional sonographic evidence to suggest acute cholecystitis. Electronically Signed   By: Marin Olp M.D.   On: 06/30/2019 05:01    ____________________________________________   PROCEDURES  Procedure(s) performed (including Critical Care):  Procedures   ____________________________________________   INITIAL IMPRESSION / ASSESSMENT AND PLAN / ED COURSE  As part of my medical decision making, I reviewed the following data within the [place] notes reviewed and incorporated, Labs reviewed, EKG interpreted, Old chart reviewed, Radiograph reviewed and Notes from prior ED visits     Antoine Vandermeulen was evaluated in Emergency Department on 06/30/2019 for the symptoms described in the history of present illness. He was evaluated in the context of the global COVID-19 pandemic, which necessitated consideration that the patient might be at risk for infection with the SARS-CoV-2 virus that causes COVID-19. Institutional protocols and algorithms that pertain to the evaluation of patients at risk for COVID-19 are in a state of rapid change based on information released by regulatory bodies including  the [employer] and federal and state organizations. These policies and algorithms were followed during the patient's care in the ED.    48 year old male with high risk sickle cell anemia presenting with generalized abdominal pain and nausea. Differential diagnosis includes, but is not limited to, acute appendicitis, renal colic, testicular torsion, urinary tract infection/pyelonephritis, prostatitis,  epididymitis, diverticulitis, small bowel obstruction or ileus, colitis, abdominal aortic aneurysm, gastroenteritis, hernia, etc.  Laboratory results notable for H/H 5.6/15.1 with schistocytes on peripheral smear, mildly elevated  lipase, bilirubin 5.6.  These labs are [facility] to [facility] just several days ago.  Will check reticulocytes, levels of Dilantin and Tegretol which patient takes for seizures, LDH.  Initiate IV fluid resuscitation, IV morphine for pain paired with IV Zofran for nausea.  On examination, patient is generally tender, not only in his right upper quadrant.  We will proceed with CT abdomen/pelvis.   Clinical Course as of Jul 03 2331  Sat Jun 30, 2019  [id number] [facility]:  11/4 LDH 4163, retic 8% 11/5 K 6.8, tbili 7.2, AST 223, AP 351, H/H 6.4/17.9 (9/28 H/H 5.8/17.6)   [JS]  0700 Care transferred to Dr. Scotty CourtStafford at change of shift.  Pending rest of lab work, CT scan.  [facility].   [JS]  1039 The rest of the lab panel is reassuring, at baseline.  Reticulocyte percent is 17.  CT scan nonacute.  [facility] benign hemat[facility] on call who advises no particular concerns or extra work-up measures needed at this time, patient can call his sickle cell clinic with Dr. Clarene DukeLittle in 2 days on Monday.  Agrees with giving a dose of erythropoietin today if the patient is agreeable.   [PS]  1204 Confirmed with patient who says that his last Epo injection was 2 days ago when he was in clinic.   [PS]    Clinical Course User Index [JS] Irean HongSung, Shawana Knoch J, MD [PS] Sharman CheekStafford, Phillip, MD     ____________________________________________   FINAL CLINICAL IMPRESSION(S) / ED DIAGNOSES  Final diagnoses:  RUQ pain  Generalized abdominal pain  Hb-SS disease with crisis [facility](HCC)     ED Discharge Orders    None       Note:  This document was prepared using Dragon voice recognition software and may include unintentional dictation errors.   Irean HongSung, Jarick Harkins J, MD 06/30/19 [id number]    Irean HongSung, Belynda Pagaduan J, MD 07/03/19 [phone]

## 2019-06-30 NOTE — ED Provider Notes (Signed)
Procedures  Clinical Course as of Jun 29 1204  Sat Jun 30, 2019  0017 Labs from [facility]:  11/4 LDH 4163, retic 8% 11/5 K 6.8, tbili 7.2, AST 223, AP 351, H/H 6.4/17.9 (9/28 H/H 5.8/17.6)   [JS]  0700 Care transferred to Dr. Joni Fears at change of shift.  Pending rest of lab work, CT scan.  Would consult [facility] hematology.   [JS]  4944 The rest of the lab panel is reassuring, at baseline.  Reticulocyte percent is 17.  CT scan nonacute.  Discussed with [facility] benign hematology on call who advises no particular concerns or extra work-up measures needed at this time, patient can call his sickle cell clinic with Dr. Rex Kras in 2 days on Monday.  Agrees with giving a dose of erythropoietin today if the patient is agreeable.   [PS]  1204 Confirmed with patient who says that his last Epo injection was 2 days ago when he was in clinic.   [PS]    Clinical Course User Index [JS] Paulette Blanch, MD [PS] Carrie Mew, MD    ----------------------------------------- 12:05 PM on 06/30/2019 ----------------------------------------- Repeat abdominal exam is benign.  No focal tenderness, no guarding or rigidity or other peritoneal signs.  Doubt mesenteric ischemia dissection or AAA.  CT abdomen is reassuring.  Vital signs are normal, stable for discharge home.  [facility] hematology advises that usual abdominal pain work-up would suffice here without any particular concerns related to the sickle cell disease.    Carrie Mew, MD 06/30/19 [phone]

## 2019-06-30 NOTE — Discharge Instructions (Signed)
Your lab test today were all okay.  Your longstanding anemia is at your usual level.  Your CT scan today was okay.  Please follow-up with your hematology clinic on Monday to continue monitoring your symptoms.

## 2019-06-30 NOTE — ED Notes (Signed)
Patient reports was awoken from sleep due to abdominal pain especially right lower.

## 2019-06-30 NOTE — ED Notes (Signed)
Pt has taken all equipment all. Pt states it is bothering him. Pt had monitor cut off because it was making noise. Pt advised we needed to monitor him. Pt refusing to place equipment back on. Pt resting comfortably at this time. Pt states he is hungry and would like something to eat.

## 2019-06-30 NOTE — ED Notes (Signed)
Report received - pt refuses to keep his monitor on ie: pulse ox, bp cuff. Dr. Is aware.

## 2019-06-30 NOTE — ED Triage Notes (Signed)
Patient brought in by ems from home. Patient with complaint of generalized abdominal pain and nausea that started about 01:45 this am.

## 2019-07-02 MED FILL — HYDROXYUREA 500 MG CAPSULE: 30 days supply | Qty: 30 | Fill #0 | Status: AC

## 2019-07-03 NOTE — Unmapped (Signed)
Reminded labs due week of 11/16. Pt can go to [place] as he did before.

## 2019-07-04 ENCOUNTER — Emergency Department: Payer: Medicare Other

## 2019-07-04 ENCOUNTER — Emergency Department
Admission: EM | Admit: 2019-07-04 | Discharge: 2019-07-04 | Disposition: A | Discharge status: Home / Self Care | Payer: Medicare Other | Attending: Emergency Medicine | Admitting: Emergency Medicine

## 2019-07-04 ENCOUNTER — Other Ambulatory Visit: Payer: Self-pay

## 2019-07-04 ENCOUNTER — Encounter: Payer: Self-pay | Admitting: Emergency Medicine

## 2019-07-04 DIAGNOSIS — Y9389 Activity, other specified: Secondary | ICD-10-CM | POA: Insufficient documentation

## 2019-07-04 DIAGNOSIS — S299XXA Unspecified injury of thorax, initial encounter: Secondary | ICD-10-CM | POA: Diagnosis present

## 2019-07-04 DIAGNOSIS — F17200 Nicotine dependence, unspecified, uncomplicated: Secondary | ICD-10-CM | POA: Diagnosis not present

## 2019-07-04 DIAGNOSIS — Y999 Unspecified external cause status: Secondary | ICD-10-CM | POA: Diagnosis not present

## 2019-07-04 DIAGNOSIS — S20212A Contusion of left front wall of thorax, initial encounter: Secondary | ICD-10-CM | POA: Diagnosis not present

## 2019-07-04 DIAGNOSIS — S20219A Contusion of unspecified front wall of thorax, initial encounter: Secondary | ICD-10-CM

## 2019-07-04 DIAGNOSIS — R519 Headache, unspecified: Secondary | ICD-10-CM | POA: Insufficient documentation

## 2019-07-04 DIAGNOSIS — Z79899 Other long term (current) drug therapy: Secondary | ICD-10-CM | POA: Diagnosis not present

## 2019-07-04 DIAGNOSIS — S3991XA Unspecified injury of abdomen, initial encounter: Secondary | ICD-10-CM | POA: Diagnosis not present

## 2019-07-04 DIAGNOSIS — Y92410 Unspecified street and highway as the place of occurrence of the external cause: Secondary | ICD-10-CM | POA: Diagnosis not present

## 2019-07-04 DIAGNOSIS — R400 Somnolence: Secondary | ICD-10-CM | POA: Diagnosis not present

## 2019-07-04 LAB — COMPREHENSIVE METABOLIC PANEL
ALT: 30 U/L (ref 0–44)
AST: 121 U/L — ABNORMAL HIGH (ref 15–41)
Albumin: 3.5 g/dL (ref 3.5–5.0)
Alkaline Phosphatase: 339 U/L — ABNORMAL HIGH (ref 38–126)
Anion gap: 10 (ref 5–15)
BUN: 24 mg/dL — ABNORMAL HIGH (ref 6–20)
CO2: 21 mmol/L — ABNORMAL LOW (ref 22–32)
Calcium: 9 mg/dL (ref 8.9–10.3)
Chloride: 107 mmol/L (ref 98–111)
Creatinine, Ser: 0.89 mg/dL (ref 0.61–1.24)
GFR calc Af Amer: >60 mL/min (ref >60)
GFR calc non Af Amer: >60 mL/min (ref >60)
Glucose, Bld: 99 mg/dL (ref 70–99)
Potassium: 4.9 mmol/L (ref 3.5–5.1)
Sodium: 138 mmol/L (ref 135–145)
Total Bilirubin: 5.7 mg/dL — ABNORMAL HIGH (ref 0.3–1.2)
Total Protein: 8.1 g/dL (ref 6.5–8.1)

## 2019-07-04 LAB — CBC WITH DIFFERENTIAL/PLATELET
Abs Immature Granulocytes: 0.09 10*3/uL — ABNORMAL HIGH (ref 0.00–0.07)
Basophils Absolute: 0 10*3/uL (ref 0.0–0.1)
Basophils Relative: 0 %
Eosinophils Absolute: 0.1 10*3/uL (ref 0.0–0.5)
Eosinophils Relative: 1 %
HCT: 14 % — CL (ref 39.0–52.0)
Hemoglobin: 5.2 g/dL — ABNORMAL LOW (ref 13.0–17.0)
Immature Granulocytes: 1 %
Lymphocytes Relative: 19 %
Lymphs Abs: 2 10*3/uL (ref 0.7–4.0)
MCH: 35.4 pg — ABNORMAL HIGH (ref 26.0–34.0)
MCHC: 37.1 g/dL — ABNORMAL HIGH (ref 30.0–36.0)
MCV: 95.2 fL (ref 80.0–100.0)
Monocytes Absolute: 2.8 10*3/uL — ABNORMAL HIGH (ref 0.1–1.0)
Monocytes Relative: 27 %
Neutro Abs: 5.5 10*3/uL (ref 1.7–7.7)
Neutrophils Relative %: 52 %
Platelets: 186 10*3/uL (ref 150–400)
RBC: 1.47 MIL/uL — ABNORMAL LOW (ref 4.22–5.81)
RDW: 24.4 % — ABNORMAL HIGH (ref 11.5–15.5)
Smear Review: NORMAL
WBC: 10.6 10*3/uL — ABNORMAL HIGH (ref 4.0–10.5)
nRBC: 14.7 % — ABNORMAL HIGH (ref 0.0–0.2)

## 2019-07-04 LAB — URINALYSIS, COMPLETE (UACMP) WITH MICROSCOPIC
Bacteria, UA: NONE SEEN
Bilirubin Urine: NEGATIVE
Glucose, UA: NEGATIVE mg/dL
Ketones, ur: NEGATIVE mg/dL
Leukocytes,Ua: NEGATIVE
Nitrite: NEGATIVE
Protein, ur: NEGATIVE mg/dL
Specific Gravity, Urine: 1.012 (ref 1.005–1.030)
Squamous Epithelial / HPF: NONE SEEN (ref 0–5)
pH: 5 (ref 5.0–8.0)

## 2019-07-04 LAB — LIPASE, BLOOD: Lipase: 67 U/L — ABNORMAL HIGH (ref 11–51)

## 2019-07-04 LAB — TROPONIN I (HIGH SENSITIVITY): Troponin I (High Sensitivity): 23 ng/L — ABNORMAL HIGH (ref <18)

## 2019-07-04 LAB — CK: Total CK: 94 U/L (ref 49–397)

## 2019-07-04 MED ORDER — IOHEXOL 350 MG/ML SOLN
100.0000 mL | Freq: Once | INTRAVENOUS | Status: AC | PRN
  Administered 2019-07-04: 100 mL via INTRAVENOUS
  Filled 2019-07-04: qty 100

## 2019-07-04 MED ORDER — DARBEPOETIN ALFA 100 MCG/0.5ML IJ SOSY
100.0000 ug | PREFILLED_SYRINGE | Freq: Once | INTRAMUSCULAR | Status: AC
  Administered 2019-07-04: 100 ug via SUBCUTANEOUS
  Filled 2019-07-04: qty 0.5

## 2019-07-04 MED ORDER — PHENYTOIN SODIUM EXTENDED 100 MG PO CAPS
200.0000 mg | ORAL_CAPSULE | Freq: Once | ORAL | Status: AC
  Administered 2019-07-04: 200 mg via ORAL
  Filled 2019-07-04: qty 2

## 2019-07-04 MED ORDER — OXYCODONE HCL 5 MG PO CAPS: 5.0000 mg | ORAL_CAPSULE | ORAL | 0 refills | Status: DC | PRN

## 2019-07-04 MED ORDER — CARBAMAZEPINE 200 MG PO TABS
800.0000 mg | ORAL_TABLET | Freq: Once | ORAL | Status: AC
  Administered 2019-07-04: 800 mg via ORAL
  Filled 2019-07-04: qty 4

## 2019-07-04 MED ORDER — OXYCODONE-ACETAMINOPHEN 5-325 MG PO TABS
1.0000 | ORAL_TABLET | Freq: Once | ORAL | Status: AC
  Administered 2019-07-04: 1 via ORAL
  Filled 2019-07-04: qty 1

## 2019-07-04 MED ORDER — DEXTROSE-NACL 5-0.45 % IV SOLN
INTRAVENOUS | Status: DC
  Administered 2019-07-04: 19:00:00 via INTRAVENOUS

## 2019-07-04 NOTE — Discharge Instructions (Addendum)
We discussed your case with [facility] Hematology. They advised [place] to give you the shot of EPO medication here. They also want you to [place].  Call [facility] in the morning to discuss your recent visit.
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
## 2019-07-04 NOTE — ED Notes (Signed)
This RN attempted IV access x1 with no success. Another RN has been asked to attempt.
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
## 2019-07-04 NOTE — ED Notes (Signed)
Patient transported to CT 

## 2019-07-04 NOTE — ED Notes (Signed)
IV team at bedside 

## 2019-07-04 NOTE — ED Notes (Signed)
ED Provider at bedside. 

## 2019-07-04 NOTE — ED Notes (Addendum)
IV is infiltrated. Warm compress applied to arm. IV removed.  IV team re-consulted.

## 2019-07-05 ENCOUNTER — Emergency Department: Payer: Medicare Other

## 2019-07-05 ENCOUNTER — Emergency Department
Admission: EM | Admit: 2019-07-05 | Discharge: 2019-07-05 | Disposition: A | Discharge status: Home / Self Care | Payer: Medicare Other | Attending: Emergency Medicine | Admitting: Emergency Medicine

## 2019-07-05 ENCOUNTER — Encounter: Payer: Self-pay | Admitting: Emergency Medicine

## 2019-07-05 DIAGNOSIS — M25561 Pain in right knee: Secondary | ICD-10-CM | POA: Diagnosis not present

## 2019-07-05 DIAGNOSIS — S20212D Contusion of left front wall of thorax, subsequent encounter: Secondary | ICD-10-CM | POA: Diagnosis present

## 2019-07-05 DIAGNOSIS — F17200 Nicotine dependence, unspecified, uncomplicated: Secondary | ICD-10-CM | POA: Diagnosis not present

## 2019-07-05 DIAGNOSIS — D571 Sickle-cell disease without crisis: Principal | ICD-10-CM

## 2019-07-05 MED ORDER — OXYCODONE HCL 5 MG PO TABS
5.0000 mg | ORAL_TABLET | ORAL | Status: AC
  Administered 2019-07-05: 5 mg via ORAL
  Filled 2019-07-05: qty 1
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
## 2019-07-05 NOTE — Unmapped (Signed)
I spoke w him 2 days ago and told him either [facility] or [facility]; whichever was convenient. Not in Epic. Will call again next week too since you requested.
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
## 2019-07-05 NOTE — ED Notes (Signed)
Patient transported to X-ray 
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
## 2019-07-05 NOTE — ED Notes (Signed)
Patient given phone to call ride for discharge. 

## 2019-07-12 DIAGNOSIS — D571 Sickle-cell disease without crisis: Principal | ICD-10-CM

## 2019-07-12 MED FILL — PROCRIT 40,000 UNIT/ML INJECTION SOLUTION: 28 days supply | Qty: 8 | Fill #1 | Status: AC

## 2019-07-12 MED FILL — TRUEPLUS INSULIN 1 ML 29 GAUGE X 1/2" SYRINGE (12 MM): 84 days supply | Qty: 24 | Fill #0 | Status: AC

## 2019-07-12 MED FILL — TRUEPLUS INSULIN 1 ML 29 GAUGE X 1/2" SYRINGE (12 MM): 84 days supply | Qty: 24 | Fill #0

## 2019-07-12 MED FILL — PROCRIT 40,000 UNIT/ML INJECTION SOLUTION: SUBCUTANEOUS | 28 days supply | Qty: 8 | Fill #1
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
## 2019-08-01 NOTE — Unmapped (Signed)
Contacted patient to confirm scheduled appointment and prepare chart for visit. Patient verified that a phone visit is okay.     Triage complete.

## 2019-08-02 ENCOUNTER — Institutional Professional Consult (permissible substitution): Admit: 2019-08-02 | Discharge: 2019-08-03 | Payer: MEDICARE | Attending: Hematology | Primary: Hematology

## 2019-08-02 MED ORDER — OXYCODONE 5 MG TABLET
ORAL_TABLET | Freq: Three times a day (TID) | ORAL | 0 refills | 4 days | Status: CP | PRN
Start: 2019-08-02 — End: ?

## 2019-08-02 MED ORDER — FOLIC ACID 1 MG TABLET
ORAL_TABLET | Freq: Every day | ORAL | 3 refills | 30 days | Status: CP
Start: 2019-08-02 — End: ?
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
## 2019-08-03 ENCOUNTER — Other Ambulatory Visit: Payer: Self-pay

## 2019-08-03 ENCOUNTER — Emergency Department: Payer: Medicare Other

## 2019-08-03 ENCOUNTER — Encounter: Payer: Self-pay | Admitting: *Deleted

## 2019-08-03 ENCOUNTER — Emergency Department
Admission: EM | Admit: 2019-08-03 | Discharge: 2019-08-03 | Disposition: A | Discharge status: Home / Self Care | Payer: Medicare Other | Attending: Emergency Medicine | Admitting: Emergency Medicine

## 2019-08-03 DIAGNOSIS — Z79899 Other long term (current) drug therapy: Secondary | ICD-10-CM | POA: Diagnosis not present

## 2019-08-03 DIAGNOSIS — M79672 Pain in left foot: Secondary | ICD-10-CM | POA: Diagnosis present

## 2019-08-03 DIAGNOSIS — F17210 Nicotine dependence, cigarettes, uncomplicated: Secondary | ICD-10-CM | POA: Insufficient documentation

## 2019-08-03 MED ORDER — NAPROXEN 500 MG PO TABS
500.0000 mg | ORAL_TABLET | Freq: Once | ORAL | Status: AC
  Administered 2019-08-03: 500 mg via ORAL
  Filled 2019-08-03: qty 1

## 2019-08-03 MED ORDER — LIDOCAINE 5 % EX PTCH
1.0000 | MEDICATED_PATCH | CUTANEOUS | Status: DC
  Administered 2019-08-03: 1 via TRANSDERMAL
  Filled 2019-08-03: qty 1

## 2019-08-03 MED ORDER — LIDOCAINE 5 % EX PTCH: 1.0000 | MEDICATED_PATCH | Freq: Two times a day (BID) | CUTANEOUS | 0 refills | Status: AC

## 2019-08-03 MED ORDER — NAPROXEN 375 MG PO TABS: 375.0000 mg | ORAL_TABLET | Freq: Two times a day (BID) | ORAL | 0 refills | Status: AC
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
## 2019-08-03 NOTE — ED Notes (Signed)
See triage note  Presents with left foot pain   States pain started about 2 weeks ago  Denies any injury  States pain to left lateral foot   Good pulses

## 2019-08-03 NOTE — ED Triage Notes (Signed)
Pt to ED reporting left foot pain since the end of November. Pt denies injury to foot that he is aware of. No hx of gout. No obvious swelling, deformity or injury noted upon assessment.

## 2019-08-03 NOTE — Discharge Instructions (Addendum)
X-ray was negative for fracture or soft tissue injury.  Follow discharge care instruction take medication as directed.  Call the podiatry clinic to schedule an appointment.  Tell them you are a follow-up from the emergency room.

## 2019-08-07 NOTE — Unmapped (Signed)
Hydroxyurea refill request  Dilantin refill request  [place]  Phone [phone]

## 2019-08-09 ENCOUNTER — Ambulatory Visit
Admission: EM | Admit: 2019-08-09 | Discharge: 2019-08-09 | Disposition: A | Discharge status: Home / Self Care | Payer: Medicare Other | Attending: Family Medicine | Admitting: Family Medicine

## 2019-08-09 ENCOUNTER — Ambulatory Visit: Admit: 2019-08-09 | Discharge: 2019-08-10 | Disposition: A | Discharge status: Home / Self Care | Payer: MEDICARE

## 2019-08-09 ENCOUNTER — Emergency Department: Admit: 2019-08-09 | Discharge: 2019-08-10 | Disposition: A | Discharge status: Home / Self Care | Payer: MEDICARE

## 2019-08-09 DIAGNOSIS — S0990XA Unspecified injury of head, initial encounter: Secondary | ICD-10-CM

## 2019-08-09 DIAGNOSIS — D649 Anemia, unspecified: Secondary | ICD-10-CM

## 2019-08-09 DIAGNOSIS — S0181XA Laceration without foreign body of other part of head, initial encounter: Secondary | ICD-10-CM

## 2019-08-09 DIAGNOSIS — D571 Sickle-cell disease without crisis: Principal | ICD-10-CM

## 2019-08-09 LAB — MANUAL DIFFERENTIAL
BASOPHILS - ABS (DIFF): 0.3 10*9/L — ABNORMAL HIGH (ref 0.0–0.1)
BASOPHILS - REL (DIFF): 4 %
EOSINOPHILS - ABS (DIFF): 0.1 10*9/L (ref 0.0–0.4)
LYMPHOCYTES - ABS (DIFF): 2.4 10*9/L (ref 1.5–5.0)
LYMPHOCYTES - REL (DIFF): 35 %
MONOCYTES - ABS (DIFF): 2.1 10*9/L — ABNORMAL HIGH (ref 0.2–0.8)
MONOCYTES - REL (DIFF): 30 %
NEUTROPHILS - ABS (DIFF): 2.1 10*9/L (ref 2.0–7.5)
NEUTROPHILS - REL (DIFF): 30 %

## 2019-08-09 LAB — BASIC METABOLIC PANEL
ANION GAP: 9 mmol/L (ref 7–15)
ANION GAP: 5 mmol/L — ABNORMAL LOW (ref 7–15)
BLOOD UREA NITROGEN: 21 mg/dL (ref 7–21)
BLOOD UREA NITROGEN: 23 mg/dL — ABNORMAL HIGH (ref 7–21)
BUN / CREAT RATIO: 30
BUN / CREAT RATIO: 29
CALCIUM: 9.3 mg/dL (ref 8.5–10.2)
CALCIUM: 9.3 mg/dL (ref 8.5–10.2)
CHLORIDE: 112 mmol/L — ABNORMAL HIGH (ref 98–107)
CHLORIDE: 110 mmol/L — ABNORMAL HIGH (ref 98–107)
CO2: 21 mmol/L — ABNORMAL LOW (ref 22.0–30.0)
CO2: 27 mmol/L (ref 22.0–30.0)
CREATININE: 0.7 mg/dL (ref 0.70–1.30)
CREATININE: 0.78 mg/dL (ref 0.70–1.30)
EGFR CKD-EPI AA MALE: >90 mL/min/{1.73_m2} (ref >=60)
EGFR CKD-EPI AA MALE: >90 mL/min/{1.73_m2} (ref >=60)
EGFR CKD-EPI NON-AA MALE: >90 mL/min/{1.73_m2} (ref >=60)
EGFR CKD-EPI NON-AA MALE: >90 mL/min/{1.73_m2} (ref >=60)
GLUCOSE RANDOM: 91 mg/dL (ref 70–179)
GLUCOSE RANDOM: 92 mg/dL (ref 70–179)
POTASSIUM: 6 mmol/L — ABNORMAL HIGH (ref 3.5–5.0)
POTASSIUM: 5.1 mmol/L — ABNORMAL HIGH (ref 3.5–5.0)
SODIUM: 142 mmol/L (ref 135–145)

## 2019-08-09 LAB — CBC W/ AUTO DIFF
HEMATOCRIT: 19.1 % — ABNORMAL LOW (ref 38.0–50.0)
HEMOGLOBIN: 6.9 g/dL — ABNORMAL LOW (ref 13.5–17.5)
MEAN CORPUSCULAR HEMOGLOBIN CONC: 35.9 g/dL (ref 30.0–36.0)
MEAN CORPUSCULAR HEMOGLOBIN: 39.4 pg — ABNORMAL HIGH (ref 26.0–34.0)
MEAN CORPUSCULAR VOLUME: 109.8 fL — ABNORMAL HIGH (ref 81.0–95.0)
MEAN PLATELET VOLUME: 10.1 fL — ABNORMAL HIGH (ref 7.0–10.0)
NUCLEATED RED BLOOD CELLS: 25 /100{WBCs} — ABNORMAL HIGH (ref <=4)
PLATELET COUNT: 129 10*9/L — ABNORMAL LOW (ref 150–450)
RED CELL DISTRIBUTION WIDTH: 21.4 % — ABNORMAL HIGH (ref 12.0–15.0)
WBC ADJUSTED: 6.9 10*9/L (ref 3.5–10.5)

## 2019-08-09 LAB — URINALYSIS WITH CULTURE REFLEX
BACTERIA: NONE SEEN /HPF
BLOOD UA: NEGATIVE
GLUCOSE UA: NEGATIVE
LEUKOCYTE ESTERASE UA: NEGATIVE
NITRITE UA: NEGATIVE
PH UA: 7 (ref 5.0–9.0)
PROTEIN UA: NEGATIVE
RBC UA: 0 /HPF (ref <3)
SQUAMOUS EPITHELIAL: 2 /HPF (ref 0–5)
WBC UA: 1 /HPF (ref <2)

## 2019-08-09 LAB — WBC UA: Leukocytes:Naric:Pt:Urine sed:Qn:Microscopy.light.HPF: 1

## 2019-08-09 LAB — ANION GAP: Anion gap 3:SCnc:Pt:Ser/Plas:Qn:: 9

## 2019-08-09 LAB — EOSINOPHILS - REL (DIFF): Eosinophils/100 leukocytes:NFr:Pt:Bld:Qn:Manual count: 1

## 2019-08-09 LAB — HEPATIC FUNCTION PANEL
ALKALINE PHOSPHATASE: 430 U/L — ABNORMAL HIGH (ref 38–126)
ALT (SGPT): 30 U/L (ref <50)
BILIRUBIN DIRECT: 5.4 mg/dL — ABNORMAL HIGH (ref 0.00–0.40)
BILIRUBIN TOTAL: 8.2 mg/dL — ABNORMAL HIGH (ref 0.0–1.2)
PROTEIN TOTAL: 9.3 g/dL — ABNORMAL HIGH (ref 6.5–8.3)

## 2019-08-09 LAB — MACROCYTES

## 2019-08-09 LAB — SODIUM: Sodium:SCnc:Pt:Ser/Plas:Qn:: 142

## 2019-08-09 LAB — LACTATE DEHYDROGENASE: Lactate dehydrogenase:CCnc:Pt:Ser/Plas:Qn:: 2753 — ABNORMAL HIGH

## 2019-08-09 LAB — AST (SGOT): Aspartate aminotransferase:CCnc:Pt:Ser/Plas:Qn:: 118 — ABNORMAL HIGH

## 2019-08-09 MED ADMIN — acetaminophen (TYLENOL) tablet 1,000 mg: 1000 mg | ORAL | Stop: 2019-08-09

## 2019-08-09 NOTE — Unmapped (Signed)
PTA was seen at [facility].   Was bandaged by them and sent here.     Pt fell about 3 hours ago. On concrete.       Dizziness reported prior to falling.   Has hemoglobin issues.     Remembers everything.     No thinners.   No LOC.       Left shoulder pain reported.

## 2019-08-09 NOTE — ED Provider Notes (Addendum)
[facility]    CSN: [id number] Arrival date & time: 08/09/19  1238      History   Chief Complaint Chief Complaint  Patient presents with  . Fall   HPI  48 year old male presents with dizziness and subsequent fall.  Patient has hemoglobin SS.  Difficult to control.  Severe and chronic anemia.  Per electronic medical record has been noncompliant and overall difficult to control.  Patient states that he was at home and got dizzy and subsequently fell forward hitting his head.  He has a laceration to his forehead and an abrasion underneath his right nostril.  Patient reports ongoing fatigue.  Patient states that he has recently been started on hydroxyurea and does not feel like he is tolerating the medication well.  He states that he has a good appetite.  Bleeding is controlled from the laceration.  Denies shortness of breath.  Denies pain at this time.  No other complaints or concerns at this time.  PMH, Surgical Hx, Family Hx, Social History reviewed and updated as below.  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 Authorizing Provider  carBAMazepine (TEGRETOL) 100 MG/5ML suspension Take 400 mg by mouth 4 (four) times daily.    [provider]  famotidine (PEPCID) 20 MG tablet Take 1 tablet (20 mg total) by mouth 2 (two) times daily. 06/30/19   Sharman Cheek, MD  folic acid (FOLVITE) 1 MG tablet Take 1 mg by mouth daily.    [provider]  hydroxyurea (HYDREA) 500 MG capsule Take  500 mg by mouth daily. May take with food to minimize GI side effects.    [provider]  lidocaine (LIDODERM) 5 % Place 1 patch onto the skin every 12 (twelve) hours. Remove & Discard patch within 12 hours or as directed by MD 08/03/19 08/02/20  Joni Reining, PA-C  naproxen (NAPROSYN) 375 MG tablet Take 1 tablet (375 mg total) by mouth 2 (two) times daily with a meal. 08/03/19   Joni Reining, PA-C  phenytoin (DILANTIN) 100 MG ER capsule Take by mouth 3 (three) times daily.    [provider]    Family History Mother - Obesity, HTN  Social History Social History   Tobacco Use  . Smoking status: Current Every Day Smoker    Packs/day: 0.25    Types: Cigarettes  . Smokeless tobacco: Never Used  Substance Use Topics  . Alcohol use: Not Currently  . Drug use: Never    Allergies   Patient has no known allergies.   Review of Systems Review of Systems  Constitutional: Positive for fatigue.  HENT:       Head injury.   Neurological: Positive for dizziness.  All other systems reviewed and are negative.  Physical Exam Triage Vital Signs ED Triage Vitals  Enc Vitals Group     BP 08/09/19 1254 123/79     Pulse Rate 08/09/19 1254 (!) 57     Resp 08/09/19 1254 16  Temp 08/09/19 1254 97.9 F (36.6 C)     Temp Source 08/09/19 1254 Oral     SpO2 08/09/19 1254 97 %     Weight 08/09/19 1252 150 lb (68 kg)     Height --      Head Circumference --      Peak Flow --      Pain Score 08/09/19 1252 8     Pain Loc --      Pain Edu? --      Excl. in [place]? --    Updated Vital Signs BP 123/79 (BP Location: Left Arm)   Pulse (!) 57   Temp 97.9 F (36.6 C) (Oral)   Resp 16   Wt 68 kg   SpO2 97%   BMI 22.81 kg/m   Visual Acuity Right Eye Distance:   Left Eye Distance:   Bilateral Distance:    Right Eye Near:   Left Eye Near:    Bilateral Near:     Physical Exam Vitals and nursing note reviewed.  Constitutional:      Appearance: He is not  diaphoretic.     Comments: Awake but fatigued/lethargic.   HENT:     Head:      Comments: Laceration noted just above the glabella.    Nose: No rhinorrhea.     Comments: Abrasion noted below the left nostril.    Mouth/Throat:     Mouth: Mucous membranes are moist.     Pharynx: Oropharynx is clear.  Eyes:     General: Scleral icterus present.     Extraocular Movements: Extraocular movements intact.     Pupils: Pupils are equal, round, and reactive to light.  Cardiovascular:     Rate and Rhythm: Regular rhythm. Bradycardia present.  Pulmonary:     Effort: Pulmonary effort is normal. No respiratory distress.     Breath sounds: Normal breath sounds.  Abdominal:     General: There is no distension.     Palpations: Abdomen is soft.     Comments: Hepatomegaly noted.  Skin:    General: Skin is warm.     Findings: No rash.     Comments: Laceration and abrasion noted (see Nose and Head)  Neurological:     Comments: CN intact. Normal strength of the upper extremities. Slow to respond to questioning.  Psychiatric:     Comments: Flat affect. Slow to respond to questioning.      UC Treatments / Results  Labs (all labs ordered are listed, but only abnormal results are displayed) Labs Reviewed - No data to display  EKG   Radiology No results found.  Procedures Procedures (including critical care time)  Medications Ordered in UC Medications - No data to display  Initial Impression / Assessment and Plan / UC Course  I have reviewed the triage vital signs and the nursing notes.  Pertinent labs & imaging results that were available during my care of the patient were reviewed by me and considered in my medical decision making (see chart for details).    48 year old male presents with dizziness which led to fall.  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  Difficult to control.  I believe that patient is symptomatically anemic which has resulted in his dizziness  and subsequent fall.  Patient does not appear well on exam.  Sending to hospital for further evaluation and management.  Final Clinical Impressions(s) / UC Diagnoses   Final diagnoses:  Facial laceration, initial encounter  Injury  of head, initial encounter  Symptomatic anemia   Discharge Instructions   None    ED Prescriptions    None     PDMP not reviewed this encounter.   Tommie SamsCook, Taylon Louison G, DO 08/09/19 1404    Everlene Otherook, Michelyn Scullin G, DO 08/09/19 1409

## 2019-08-09 NOTE — ED Triage Notes (Signed)
Pt. States he has been dizzy every morning because of medication change. He was outside doing some work & he tripped & fell today at 12p.

## 2019-08-10 LAB — RETICULOCYTES: RETICULOCYTE ABSOLUTE COUNT, MANUAL: 183.2 10*9/L — ABNORMAL HIGH (ref 27.0–120.0)

## 2019-08-10 LAB — RETIC COUNT, MANUAL: Lab: 9.9 — ABNORMAL HIGH

## 2019-08-10 MED ADMIN — lidocaine-EPINEPHrine (XYLOCAINE W/EPI) 1 %-1:100,000 injection 30 mL: 30 mL | @ 01:00:00 | Stop: 2019-08-09

## 2019-08-10 MED ADMIN — insulin regular (HumuLIN,NovoLIN) injection 10 Units: 10 [IU] | INTRAVENOUS | @ 02:00:00 | Stop: 2019-08-09

## 2019-08-10 MED ADMIN — lactated ringers bolus 1,000 mL: 1000 mL | INTRAVENOUS | Stop: 2019-08-09

## 2019-08-10 MED ADMIN — ketorolac (TORADOL) injection 15 mg: 15 mg | INTRAVENOUS | Stop: 2019-08-09

## 2019-08-10 MED ADMIN — phenytoin (DILANTIN) ER capsule 200 mg: 200 mg | ORAL | @ 03:00:00 | Stop: 2019-08-09

## 2019-08-10 MED ADMIN — lactated ringers bolus 1,000 mL: 1000 mL | INTRAVENOUS | @ 02:00:00 | Stop: 2019-08-09

## 2019-08-10 MED ADMIN — carBAMazepine (TEGretol  XR) 12 hr tablet 800 mg: 800 mg | ORAL | @ 03:00:00 | Stop: 2019-08-09

## 2019-08-10 MED ADMIN — dextrose 50 % in water (D50W) 50 % solution 50 mL: 50 mL | INTRAVENOUS | @ 02:00:00 | Stop: 2019-08-09

## 2019-08-10 MED ADMIN — calcium gluconate 1 g in sodium chloride (NS) 0.9 % 100 mL IVPB: 1 g | INTRAVENOUS | @ 02:00:00 | Stop: 2019-08-09

## 2019-08-10 NOTE — Unmapped (Signed)
[facility]  Emergency Department Provider Note    ED Clinical Impression     Final diagnoses:   Fall, initial encounter (Primary)   Laceration of forehead, initial encounter   Hyperkalemia       Initial Impression, ED Course, Assessment and Plan     Impression: Patient is a 48 y.o. male with a PMH of seizures, Hb-SS disease, and HTN presenting for evaluation after a fall earlier today.  Reports dizziness over the past few days consistent with when he is anemic.  [name] [name] forward today, ending on forehead.  No loss of consciousness.  Evaluated at urgent care and referred here.    Hemodynamically stable on arrival here with a heart rate of 55, otherwise no hypotension, hypoxia, or fever.  Physical exam is notable for a 1 x 1 cm avulsion just superior to his nasal bridge as well as a 1 cm abrasion just under the right nares.  No other evidence of trauma on head to toe exam, no midline spinal tenderness.  C-spine cleared using Nexus criteria.  No obvious mucosal pallor appreciated.  Neuro exam is nonfocal with intact cranial nerves and normal cerebellar testing.    Patient's dizzy sensation and fall may be secondary to anemia but the differential also includes electrolyte derangement, arrhythmia, occult infection but no localizing symptoms, less likely CVA given his neuro exam.  We will check a CBC, BMP, reticulocyte count, LDH, urinalysis, EKG, as well as CT of his head and face.    7:24 PM  CT negative for any intracranial bleed or facial fractures.    8:21 PM  Forehead laceration repaired with 4 simple interrupted sutures.  Patient tolerated the procedure well.    CBC notable for hemoglobin of 6.9 up from 6.4 one month prior.  BMP has an elevated potassium of 6.0 bicarb slightly low at 21, normal renal function.  UA has no evidence of infection but does have moderate bilirubin.  LDH is elevated at 2753 but down from 1 month ago. Patient is taking his Kayexalate. EKG does have sinus bradycardia with a mildly increased QRS of 124 but normal PR interval and no peaked T waves.    Given the hyperkalemia, we will give a gram of calcium as well as 10 units of insulin and an ampule of D50.  Plan to repeat a potassium and give a second liter of fluids.  If patient's potassium corrects and his symptoms resolved, plan for discharge with outpatient follow-up with his hematologist.    11:59 PM  Patient able to ambulate around the room without difficulty.  Denies any lightheaded sensation or dizziness at this time.  Potassium has corrected to 5.1.  Given this, we will discharge the patient with instructions to follow-up with his hematologist.  I reviewed laceration care instructions and have also instructed him to return here or follow-up with his PCP in 7 days for suture removal.  Patient is in agreement with this plan.    Additional Medical Decision Making     I have reviewed the vital signs and the nursing notes. Labs and radiology results that were available during my care of the patient were independently reviewed by me and considered in my medical decision making.     I staffed the case with the ED attending, Dr. Clinton Sawyer.    I independently visualized the EKG tracing.   I independently visualized the radiology images.   I reviewed the patient's prior medical records.     Portions of this  record have been created using Scientist, clinical (histocompatibility and immunogenetics). Dictation errors have been sought, but may not have been identified and corrected.  ____________________________________________       History     Chief Complaint  Fall      HPI [name] [name] is a 48 y.o. male with a PMH of seizures, Hb-SS disease, and HTN presenting for evaluation after a fall. Patient reports falling forward and hitting his head on concrete this afternoon due to feeling dizzy, which he describes as a room-spinning sensation, in the setting of feeling dizzy for the past four days. Denies any LOC. States that he required his brother's assistance to get up off the concrete. Also endorses left arm pain that he believes is due to him trying to catch himself when he fell. Reports being seen at UC earlier, being bandaged on his head, and then sent to the ED for further evaluation.States that he has occasional dizziness when he does not sleep, but denies having a similar episode before. Denies any recent changes in his medications. Reports being adherent to his Procrit. Endorses having BLE weakness at baseline. Denies any headache, difficulty swallowing or eating, numbness or tingling in extremities, fever, cough, nausea, vomiting, constipation, diarrhea, or urinary symptoms.      Past Medical History:   Diagnosis Date   ??? Anemia    ??? Foot pain    ??? 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 Hypertension    ??? Pulmonary hypertension (CMS-HCC)    ??? Seizures (CMS-HCC)    ??? Sickle cell anemia (CMS-HCC)    ??? Vitamin D deficiency        Patient Active Problem List   Diagnosis   ??? Hyperpotassemia   ??? 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 Sickle cell anemia (CMS-HCC)   ??? Hyperkalemia   ??? Proteinuria   ??? Vitamin D deficiency   ??? Seizure disorder (CMS-HCC)   ??? Pain of left hip joint   ??? Ear infection   ??? Sickle cell crisis (CMS-HCC)   ??? Pulmonary HTN (CMS-HCC)   ??? Anemia   ??? Edema   ??? Hypoxemia   ??? Tobacco use disorder   ??? High output heart failure (CMS-HCC)   ??? Vertigo   ??? Hyperbilirubinemia   ??? Nausea & vomiting       Past Surgical History:   Procedure Laterality Date   ??? PR RIGHT HEART CATH O2 SATURATION & CARDIAC OUTPUT N/A 07/18/2017 Procedure: Right Heart Catheterization;  Surgeon: Zannie Cove, MD;  Location: [facility] CATH;  Service: Cardiology       No current facility-administered medications for this encounter.     Current Outpatient Medications:   ???  acetaminophen (TYLENOL 8 HOUR) 650 MG CR tablet, Take 650 mg by mouth every eight (8) hours as needed for pain., Disp: , Rfl:   ???  carBAMazepine (TEGRETOL  XR) 400 MG 12 hr tablet, Take 2 tablets (800 mg total) by mouth Two (2) times a day., Disp: 120 tablet, Rfl: 11  ???  cholecalciferol, vitamin D3, (VITAMIN D3) 10 mcg (400 unit) cap, Take 1 capsule (400 Units total) by mouth daily., Disp: 30 each, Rfl: 0  ???  diclofenac sodium (VOLTAREN) 1 % gel, Apply 2 g topically daily as needed for arthritis. (Patient not taking: Reported on 08/01/2019), Disp: 100 g, Rfl: prn  ???  empty container (SHARPS CONTAINER) Misc, use as directed, Disp: 1 each, Rfl: 2  ???  epoetin alfa (EPOGEN,PROCRIT)  40,000 unit/mL injection, Inject 1 mL (40,000 Units total) under the skin Two (2) times a week., Disp: 8 mL, Rfl: 3  ???  folic acid (FOLVITE) 1 MG tablet, Take 1 tablet (1 mg total) by mouth daily., Disp: 30 tablet, Rfl: 3  ???  hydroxyurea (HYDREA) 500 mg capsule, Take 1 capsule (500 mg total) by mouth daily., Disp: 30 capsule, Rfl: 0  ???  insulin syringe-needle U-100 (TRUEPLUS INSULIN) 1 mL 29 gauge x 1/2 (12 mm) Syrg, 1 each by Miscellaneous route Two (2) times a week., Disp: 24 each, Rfl: 3  ???  nystatin-triamcinolone (MYCOLOG) 100,000-0.1 unit/gram-% ointment, Apply topically Two (2) times a day. (Patient not taking: Reported on 06/26/2019), Disp: 30 g, Rfl: 0  ???  oxyCODONE (ROXICODONE) 5 MG immediate release tablet, Take 1 tablet (5 mg total) by mouth every eight (8) hours as needed for pain for up to 10 doses., Disp: 10 tablet, Rfl: 0  ???  OXYGEN-AIR DELIVERY SYSTEMS MISC, 2 L/min by Each Nare route nightly. [employer], Disp: , Rfl: ???  phenytoin (DILANTIN) 100 MG ER capsule, Take 2 capsules (200 mg total) by mouth Two (2) times a day., Disp: 120 capsule, Rfl: 11  ???  promethazine (PHENERGAN) 12.5 MG tablet, Take 12.5 mg by mouth every six (6) hours as needed for nausea., Disp: , Rfl:   ???  sodium polystyrene (KAYEXALATE) powder, Take 15 g by mouth continuous as needed (with high potassium food (e.g. banana))., Disp: , Rfl:     Allergies  Patient has no known allergies.    Family History   Problem Relation Age of Onset   ??? Hypertension Mother    ??? Hypertension Father    ??? No Known Problems Sister    ??? No Known Problems Brother    ??? No Known Problems Brother    ??? No Known Problems Brother    ??? No Known Problems Brother    ??? Sickle cell anemia Sister    ??? No Known Problems Sister    ??? Clotting disorder Paternal Grandmother    ??? No Known Problems Maternal Aunt    ??? No Known Problems Maternal Uncle    ??? No Known Problems Paternal Aunt    ??? No Known Problems Paternal Uncle    ??? No Known Problems Maternal Grandmother    ??? No Known Problems Maternal Grandfather    ??? No Known Problems Paternal Grandfather    ??? No Known Problems Other    ??? Seizures Neg Hx    ??? Pulmonary Hypertension Neg Hx    ??? Autoimmune disease Neg Hx    ??? Venous thrombosis Neg Hx    ??? Amblyopia Neg Hx    ??? Blindness Neg Hx    ??? Cancer Neg Hx    ??? Cataracts Neg Hx    ??? Diabetes Neg Hx    ??? Glaucoma Neg Hx    ??? Macular degeneration Neg Hx    ??? Retinal detachment Neg Hx    ??? Strabismus Neg Hx    ??? Stroke Neg Hx    ??? Thyroid disease Neg Hx        Social History  Social History     Tobacco Use   ??? Smoking status: Current Some Day Smoker     Packs/day: 1.00     Years: 0.50     Pack years: 0.50     Types: Cigars   ??? Smokeless tobacco: Never Used   ??? Tobacco comment: Pt smokes  1-2 cigars daily, Pt intends to cease tobacco use    Substance Use Topics   ??? Alcohol use: Not Currently     Alcohol/week: 0.0 standard drinks     Drinks per session: 1 or 2     Binge frequency: Never Comment: occasional   ??? Drug use: No       Review of Systems  Constitutional: Negative for fever.  Eyes: Negative for visual changes.  HENT: Positive for wound. Negative for sore throat.  CV: Negative for chest pain.  Resp: Negative for shortness of breath or cough.  GI: Negative for nausea, vomiting, or abdominal pain.  GU: Negative for dysuria or hematuria.  MSK: Positive for shoulder pain.   Derm: Negative for rash.  Neuro: Negative for headaches.      Physical Exam     ED Triage Vitals   Enc Vitals Group      BP 08/09/19 1444 136/85      Heart Rate 08/09/19 1512 55      SpO2 Pulse 08/09/19 1444 51      Resp 08/09/19 1444 18      Temp 08/09/19 1444 36.4 ??C (97.5 ??F)      Temp Source 08/09/19 1444 Oral      SpO2 08/09/19 1444 100 %     Constitutional: Appears stated age, sitting up in the stretcher resting comfortably in no acute distress.  HEENT: Normocephalic. 1 cm x 1 cm square skin avulsion just superior to nasal bridge, as well as 1 cm abrasion to right nare. No nasal septal hematoma. No dental or intraoral trauma. Conjunctivae clear. No congestion. Moist mucous membranes. No cervical lymphadenopathy.   Heme/Lymph/Immuno: No petechiae or bruising  CV: RRR, no murmurs. Symmetric pulses in all extremities. No JVD or peripheral edema.  Resp: Clear to auscultation bilaterally. No wheezes or rhonchii.  GI: Soft and non tender, non distended. No rebound, rigidity, or guarding.   GU: There is no CVA tenderness.   MSK: LUE with full ROM in shoulder and elbow without any instability or deformity.  No midline spinal tenderness, full range of motion in the neck.  Neuro: Normal speech and language. No gross focal neurologic deficits appreciated.  Skin: Warm, dry and intact.  Psychiatric: Mood and affect are normal. Speech and behavior are normal.    EKG     Sinus bradycardia at a rate of 52, axis of 47, PR 172, QRS of 124, QTC of 470, no ST changes concerning for acute ischemia, no peaked T waves.    Radiology CT Head Wo Contrast   Final Result      No acute intracranial abnormality.      Small left frontal scalp hematoma with adjacent soft tissue dressing.         CT Maxillofacial Wo Contrast   Final Result      No acute maxillofacial fracture.      Small left forehead scalp hematoma with adjacent soft tissue dressing.               Procedures     None         Documentation assistance was provided by Sherle Poe, Scribe, on August 09, 2019 at 5:45 PM for Barrett Shell, MD.     I reviewed the documentation as performed by the scribe and agree with its contents.           Barrett Shell, MD  Resident  08/10/19 0001

## 2019-08-10 NOTE — Unmapped (Signed)
Reticulocytes  Order: [id number]  Status:  Final result ????    Sent to provider for review and action

## 2019-08-10 NOTE — Unmapped (Signed)
Relayed information to his Mother Eber Jones and she will instruct her son when he returns.

## 2019-08-14 DIAGNOSIS — D571 Sickle-cell disease without crisis: Principal | ICD-10-CM

## 2019-08-14 NOTE — Unmapped (Signed)
[facility] Refill Coordination Note    Specialty Medication(s) to be Shipped:   Hematology/Oncology: Hydroxyurea and Procrit    Other medication(s) to be shipped: [name] [name], DOB: Feb 15, 1971  Phone: [phone] (home)       All above HIPAA information was verified with patient.     Was a Nurse, learning disability used for this call? No    Completed refill call assessment today to schedule patient's medication shipment from the [facility] [phone]).       Specialty medication(s) and dose(s) confirmed: Regimen is correct and unchanged.   Changes to medications: [name] [name] reports no changes at this time.  Changes to insurance: No  Questions for the pharmacist: No    Confirmed patient received Welcome Packet with first shipment. The patient will receive a drug information handout for each medication shipped and additional FDA Medication Guides as required.       DISEASE/MEDICATION-SPECIFIC INFORMATION        N/A    SPECIALTY MEDICATION ADHERENCE     Medication Adherence    Patient reported X missed doses in the last month: 4  Specialty Medication: HYDROXYUREA 500 MG   Patient is on additional specialty medications: Yes  Additional Specialty Medications: PROCRIT 40,000  Patient Reported Additional Medication X Missed Doses in the Last Month: 0  Patient is on more than two specialty medications: No  Informant: patient  Confirmed plan for next specialty medication refill: delivery by pharmacy  Refills needed for supportive medications: yes, ordered or provider notified          Refill Coordination    Has the Patients' Contact Information Changed: No  Is the Shipping Address Different: No           HYDROXYUREA 500 mg: 2 days of medicine on hand   PROCRIT 40,000 U: 10 days of medicine on hand         SHIPPING     Shipping address confirmed in Epic. Delivery Scheduled: Yes, Expected medication delivery date: 12/23.  However, Rx request for refills was sent to the provider as there are none remaining.     Medication will be delivered via Same Day Courier to the prescription address in Epic WAM.    Jolene Schimke   [facility] Specialty Technician

## 2019-08-14 NOTE — Unmapped (Signed)
I am not sure he is still on hydrea, and am seeing him tomorrow, 12/23, will reassess/

## 2019-08-14 NOTE — Unmapped (Signed)
Refill request from pharmacy. Routed to sickle cell providers.

## 2019-08-15 ENCOUNTER — Ambulatory Visit: Admit: 2019-08-15 | Discharge: 2019-08-15 | Payer: MEDICARE

## 2019-08-15 ENCOUNTER — Ambulatory Visit: Admit: 2019-08-15 | Discharge: 2019-08-15 | Payer: MEDICARE | Attending: Hematology | Primary: Hematology

## 2019-08-15 DIAGNOSIS — D571 Sickle-cell disease without crisis: Principal | ICD-10-CM

## 2019-08-15 DIAGNOSIS — E875 Hyperkalemia: Principal | ICD-10-CM

## 2019-08-15 LAB — CBC W/ AUTO DIFF
BASOPHILS ABSOLUTE COUNT: 0 10*9/L (ref 0.0–0.1)
BASOPHILS RELATIVE PERCENT: 0.3 %
EOSINOPHILS ABSOLUTE COUNT: 0 10*9/L (ref 0.0–0.4)
EOSINOPHILS RELATIVE PERCENT: 1 %
HEMATOCRIT: 19.8 % — CL (ref 41.0–53.0)
HEMOGLOBIN: 6.5 g/dL — CL (ref 13.5–17.5)
LARGE UNSTAINED CELLS: 5 % — ABNORMAL HIGH (ref 0–4)
LYMPHOCYTES ABSOLUTE COUNT: 1.9 10*9/L (ref 1.5–5.0)
LYMPHOCYTES RELATIVE PERCENT: 42.6 %
MEAN CORPUSCULAR HEMOGLOBIN CONC: 32.7 g/dL (ref 31.0–37.0)
MEAN CORPUSCULAR HEMOGLOBIN: 38.9 pg — ABNORMAL HIGH (ref 26.0–34.0)
MEAN CORPUSCULAR VOLUME: 119.2 fL — ABNORMAL HIGH (ref 80.0–100.0)
MEAN PLATELET VOLUME: 9.4 fL (ref 7.0–10.0)
MONOCYTES ABSOLUTE COUNT: 0.6 10*9/L (ref 0.2–0.8)
MONOCYTES RELATIVE PERCENT: 13.8 %
NEUTROPHILS ABSOLUTE COUNT: 1.7 10*9/L — ABNORMAL LOW (ref 2.0–7.5)
NEUTROPHILS RELATIVE PERCENT: 37.4 %
NUCLEATED RED BLOOD CELLS: 78 /100{WBCs} — ABNORMAL HIGH (ref <=4)
PLATELET COUNT: 142 10*9/L — ABNORMAL LOW (ref 150–440)
RED CELL DISTRIBUTION WIDTH: 20.5 % — ABNORMAL HIGH (ref 12.0–15.0)
WBC ADJUSTED: 4.4 10*9/L — ABNORMAL LOW (ref 4.5–11.0)

## 2019-08-15 LAB — COMPREHENSIVE METABOLIC PANEL
ALBUMIN: 3.9 g/dL (ref 3.5–5.0)
ALKALINE PHOSPHATASE: 431 U/L — ABNORMAL HIGH (ref 38–126)
ALT (SGPT): 30 U/L (ref <50)
ANION GAP: 8 mmol/L (ref 7–15)
AST (SGOT): 102 U/L — ABNORMAL HIGH (ref 17–47)
BILIRUBIN TOTAL: 7.3 mg/dL — ABNORMAL HIGH (ref 0.0–1.2)
BLOOD UREA NITROGEN: 22 mg/dL — ABNORMAL HIGH (ref 7–21)
BUN / CREAT RATIO: 26
CHLORIDE: 108 mmol/L — ABNORMAL HIGH (ref 98–107)
CO2: 22 mmol/L (ref 22.0–30.0)
CREATININE: 0.85 mg/dL (ref 0.70–1.30)
EGFR CKD-EPI AA MALE: >90 mL/min/{1.73_m2} (ref >=60)
EGFR CKD-EPI NON-AA MALE: >90 mL/min/{1.73_m2} (ref >=60)
GLUCOSE RANDOM: 89 mg/dL (ref 70–179)
POTASSIUM: 5.5 mmol/L — ABNORMAL HIGH (ref 3.5–5.0)
PROTEIN TOTAL: 8.9 g/dL — ABNORMAL HIGH (ref 6.5–8.3)

## 2019-08-15 LAB — SLIDE REVIEW

## 2019-08-15 LAB — VARIABLE HEMOGLOBIN CONCENTRATION

## 2019-08-15 LAB — RETICULOCYTES: RETICULOCYTE ABSOLUTE COUNT, MANUAL: 174.3 10*9/L — ABNORMAL HIGH (ref 27.0–120.0)

## 2019-08-15 LAB — POTASSIUM: Potassium:SCnc:Pt:Ser/Plas:Qn:: 5.5 — ABNORMAL HIGH

## 2019-08-15 LAB — TARGET CELLS

## 2019-08-15 LAB — LACTATE DEHYDROGENASE: Lactate dehydrogenase:CCnc:Pt:Ser/Plas:Qn:Reaction: pyruvate to lactate: 2324 — ABNORMAL HIGH

## 2019-08-15 LAB — RETICULOCYTE ABSOLUTE COUNT, MANUAL: Lab: 174.3 — ABNORMAL HIGH

## 2019-08-15 MED ORDER — HYDROXYUREA 500 MG CAPSULE
ORAL_CAPSULE | Freq: Every day | ORAL | 0 refills | 30.00000 days | Status: CP
Start: 2019-08-15 — End: ?

## 2019-08-15 MED ORDER — SODIUM POLYSTYRENE SULFONATE ORAL POWDER
ORAL | 2 refills | 70 days | Status: CP
Start: 2019-08-15 — End: 2019-09-14

## 2019-08-15 MED FILL — PROCRIT 40,000 UNIT/ML INJECTION SOLUTION: 28 days supply | Qty: 8 | Fill #2 | Status: AC

## 2019-08-15 MED FILL — PROCRIT 40,000 UNIT/ML INJECTION SOLUTION: SUBCUTANEOUS | 28 days supply | Qty: 8 | Fill #2

## 2019-08-15 MED FILL — BD TUBERCULIN SYRINGE 1 ML 25 GAUGE X 5/8": 28 days supply | Qty: 8 | Fill #1 | Status: AC

## 2019-08-15 MED FILL — BD TUBERCULIN SYRINGE 1 ML 25 GAUGE X 5/8": 28 days supply | Qty: 8 | Fill #1

## 2019-08-15 NOTE — Unmapped (Addendum)
It was a pleasure to see you today in clinic!    Plan from today's visit  ? Follow up in  4-6 weeks in person WITH a Physical Therapy visit with CT Hongdoxmai  ? Take kayexelate every other day  ? Add hydroxyurea 500 mgms/day  ? Continue EPO BIW  ? We will ask the neurologist to move up your visit  ? Use your walker at all times.  ? Call your oxygen company about a bubbler on your oxygen tubing to improve dryness/    How to get in touch with your Sickle Cell Team:     1. If you are sick, and need an ambulance, CALL 911.    ? Otherwise, call [facility] at [phone], 8AM - 5PM, and ask for the NURSE TRIAGE LINE and leave a message   ? On nights and weekends, for urgent problems you can call the [phone] and ask for the HEMATOLOGIST ON CALL.     2.  For an appointment, please call [facility] at [phone], 8AM - 5PM.    3.  For pain medication refills, leave a message on nurse triage line THREE days before refill needed. All other medication refills ask your pharmacy to fax request.       4.  For general questions, call the sickle cell office, [phone]. You can leave non-urgent messages here as well.    5.  For social work questions or needs; Please call Dickie La RN, MSW, MPH at [phone]    Information about Coronavirus Prevention for Patients with Sickle Cell Disease.    What you can do to protect yourself???    Avoid close contact with people who are sick.  No handshakes or kissing.  No sharing of utensils.  Keep a distance of at least 6 feet apart.  Wash your hands often with soap and water for at least 20 seconds.  Cover your coughs and sneezes with a tissue and then throw the tissue in the trash.  Avoid touching your eyes, nose and mouth.  Clean and disinfect frequently touched objects and surfaces.    What we can do to help you???    Avoid going directly to the ED for pain or minor complaints  The ED is very crowded and you may be exposed to the virus and/or your care may be delayed.  We may be able to treat your pain in clinic instead of the ED, on a case-by-case basis. Call us at [facility] at [phone], 8AM - 5PM, to discuss    Stay home when you are sick and call us for additional support.    1. If you are sick, and need an ambulance, CALL 911.     2. During regular office hours, Monday to Friday (8am- 5pm), call [facility] at [phone], 8AM - 5PM, and ask for the NURSE TRIAGE LINE and leave a message    3. On nights and weekends, for urgent problems you can call the [phone] and ask for the HEMATOLOGIST ON CALL.    4. If you feel you may have been exposed to COVID-19, please reach out to your primary care provider or the sickle cell team via telephone.  For more information please refer to:    [email or web address]    or the COVID Helpline: 805-016-6510.     [Based on a templte from [employer] at [facility] in the [place]]
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
## 2019-08-16 DIAGNOSIS — D571 Sickle-cell disease without crisis: Principal | ICD-10-CM

## 2019-08-16 DIAGNOSIS — G40209 Localization-related (focal) (partial) symptomatic epilepsy and epileptic syndromes with complex partial seizures, not intractable, without status epilepticus: Principal | ICD-10-CM

## 2019-08-16 LAB — BILIRUBIN DIRECT: Bilirubin.glucuronidated+Bilirubin.albumin bound:MCnc:Pt:Ser/Plas:Qn:: 4.7 — ABNORMAL HIGH

## 2019-08-16 MED ORDER — VOXELOTOR 500 MG TABLET
ORAL_TABLET | Freq: Every day | ORAL | 0 refills | 30 days | Status: CP
Start: 2019-08-16 — End: ?
  Filled 2019-09-13: qty 30, 30d supply, fill #0

## 2019-08-16 NOTE — Unmapped (Signed)
Patient returning call to Dr. Craig Staggers. Please call patient at [phone]

## 2019-08-16 NOTE — Unmapped (Signed)
I reach the patient's mother today.  She said his balance is doing a lot better today.  She states he was taken off of hydroxyurea.  I will order anticonvulsant levels for him.  They are going to try to get these done over at the hematology oncology clinic as they have been successful in blood draws there.  He has an upcoming appointment with me.

## 2019-08-16 NOTE — Unmapped (Signed)
Left message that I had called.

## 2019-08-16 NOTE — Unmapped (Signed)
I reviewed his ANC, too low for safe hydrea. We discussed vox, and he was open to a reduced dose. Will ask pharmacy to contact him next week about 500 mgms/day.

## 2019-08-20 NOTE — Unmapped (Signed)
[facility] Specialty Medication Onboarding    Specialty Medication: Oxbryta 500 mg tablet   Prior Authorization: Not Required   Financial Assistance: No - copay  <$25  Final Copay/Day Supply: $0.00 / 30 day supply    Insurance Restrictions: None     Notes to Pharmacist:     The triage team has completed the benefits investigation and has determined that the patient is able to fill this medication at [facility]. Please contact the patient to complete the onboarding or follow up with the prescribing physician as needed.

## 2019-08-21 NOTE — Unmapped (Signed)
[facility] Clinical Assessment & Refill Coordination Note    [name] [name], DOB: 1971-02-23  Phone: [phone] (home)     All above HIPAA information was verified with patient.     Was a Nurse, learning disability used for this call? No    Specialty Medication(s):   Hematology/Oncology: [name] [name] and Procrit     Current Outpatient Medications   Medication Sig Dispense Refill   ??? acetaminophen (TYLENOL 8 HOUR) 650 MG CR tablet Take 650 mg by mouth every eight (8) hours as needed for pain.     ??? carBAMazepine (TEGRETOL  XR) 400 MG 12 hr tablet Take 2 tablets (800 mg total) by mouth Two (2) times a day. 120 tablet 11   ??? cholecalciferol, vitamin D3, (VITAMIN D3) 10 mcg (400 unit) cap Take 1 capsule (400 Units total) by mouth daily. 30 each 0   ??? diclofenac sodium (VOLTAREN) 1 % gel Apply 2 g topically daily as needed for arthritis. 100 g prn   ??? empty container (SHARPS CONTAINER) Misc use as directed 1 each 2   ??? epoetin alfa (EPOGEN,PROCRIT) 40,000 unit/mL injection Inject 1 mL (40,000 Units total) under the skin Two (2) times a week. 8 mL 3   ??? folic acid (FOLVITE) 1 MG tablet Take 1 tablet (1 mg total) by mouth daily. 30 tablet 3   ??? hydroxyurea (HYDREA) 500 mg capsule Take 1 capsule (500 mg total) by mouth daily. 30 capsule 0   ??? insulin syringe-needle U-100 (TRUEPLUS INSULIN) 1 mL 29 gauge x 1/2 (12 mm) Syrg 1 each by Miscellaneous route Two (2) times a week. 24 each 3   ??? lidocaine (LIDODERM) 5 % patch Place 1 patch on the skin.     ??? naproxen (NAPROSYN) 375 MG tablet TAKE 1 TABLET (375 MG TOTAL) BY MOUTH 2 (TWO) TIMES DAILY WITH A MEAL.     ??? nystatin-triamcinolone (MYCOLOG) 100,000-0.1 unit/gram-% ointment Apply topically Two (2) times a day. 30 g 0   ??? oxyCODONE (ROXICODONE) 5 MG immediate release tablet Take 1 tablet (5 mg total) by mouth every eight (8) hours as needed for pain for up to 10 doses. 10 tablet 0 ??? OXYGEN-AIR DELIVERY SYSTEMS MISC 2 L/min by Each Nare route nightly. [employer]     ??? phenytoin (DILANTIN) 100 MG ER capsule Take 2 capsules (200 mg total) by mouth Two (2) times a day. 120 capsule 11   ??? promethazine (PHENERGAN) 12.5 MG tablet Take 12.5 mg by mouth every six (6) hours as needed for nausea.     ??? sodium polystyrene (KAYEXALATE) powder Take 15 g by mouth Every Monday, Wednesday, and Friday. 454 g 2   ??? syringe with needle (BD TUBERCULIN SYRINGE) 1 mL 25 gauge x 5/8 Syrg Use as directed Every Tuesday and Friday. 8 each 2   ??? voxelotor (OXBRYTA) 500 mg tablet Take 1 tablet (500 mg total) by mouth daily. 30 tablet 0     No current facility-administered medications for this visit.         Changes to medications: [name] [name] reports no changes at this time.    No Known Allergies    Changes to allergies: No    SPECIALTY MEDICATION ADHERENCE     Oxbryta 500 mg: 0 days of medicine on hand   Procrit 40,000 units/mL: 14 days of medicine on hand     Medication Adherence    Specialty Medication: voxelotor 500mg   Patient is on additional specialty medications: Yes  Additional  Specialty Medications: Procrit 40,000 units/mL          Specialty medication(s) dose(s) confirmed: Voxelotor dose decrease to 500 mg daily     Are there any concerns with adherence? No    Adherence counseling provided? Not needed    CLINICAL MANAGEMENT AND INTERVENTION      Clinical Benefit Assessment:    Do you feel the medicine is effective or helping your condition? Yes    Clinical Benefit counseling provided? Not needed    Adverse Effects Assessment:    Are you experiencing any side effects? Yes, patient reports experiencing fatigue. Side effect counseling provided: resting as needed, daily mild to moderate exercise x 30 min.    Are you experiencing difficulty administering your medicine? No    Quality of Life Assessment: How many days over the past month did your Sickle cell disease without crisis  keep you from your normal activities? For example, brushing your teeth or getting up in the morning. 0    Have you discussed this with your provider? Not needed    Therapy Appropriateness:    Is therapy appropriate? Yes, therapy is appropriate and should be continued    DISEASE/MEDICATION-SPECIFIC INFORMATION      For patients on injectable medications: Patient currently has 4 doses left.  Next injection is scheduled for 08/30/19.    PATIENT SPECIFIC NEEDS     ? Does the patient have any physical, cognitive, or cultural barriers? Yes - some cognitive barriers    ? Is the patient high risk? No     ? Does the patient require a Care Management Plan? No     ? Does the patient require physician intervention or other additional services (i.e. nutrition, smoking cessation, social work)? No      SHIPPING     Specialty Medication(s) to be Shipped:   Hematology/Oncology: [name] [name] and Procrit    Other medication(s) to be shipped: none     Changes to insurance: No    Delivery Scheduled: Yes, Expected medication delivery date: 08/30/19. [name] [name]) and 09/11/19 (Procrit) - has enough syringes    Medication will be delivered via Same Day Courier to the confirmed prescription address in [place].    The patient will receive a drug information handout for each medication shipped and additional FDA Medication Guides as required.  Verified that patient has previously received a Conservation officer, historic buildings.    All of the patient's questions and concerns have been addressed.    [name] [name] [facility] Specialty Pharmacist

## 2019-08-22 ENCOUNTER — Other Ambulatory Visit: Payer: Self-pay

## 2019-08-22 ENCOUNTER — Ambulatory Visit
Admission: EM | Admit: 2019-08-22 | Discharge: 2019-08-22 | Disposition: A | Discharge status: Home / Self Care | Payer: Medicare Other | Attending: Family Medicine | Admitting: Family Medicine

## 2019-08-22 DIAGNOSIS — Z4802 Encounter for removal of sutures: Secondary | ICD-10-CM

## 2019-08-22 NOTE — ED Provider Notes (Signed)
[facility]    CSN: [id number] Arrival date & time: 08/22/19  1021      History   Chief Complaint Chief Complaint  Patient presents with  . Suture / Staple Removal   HPI   48 year old male presents for suture removal.  Patient was seen here on 12/17 by me.  Given his presentation and complex medical history he was sent to the hospital for further evaluation.  He was discharged home in stable condition.  His wound was repaired with 4 simple interrupted stitches.  Patient reports that he still feels dizzy.  He otherwise feels well.  He is in need of his sutures to be removed today.  No other complaints.  PMH, Surgical Hx, Family Hx, Social History reviewed and updated as below.  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 Authorizing Provider  carBAMazepine (TEGRETOL) 100 MG/5ML suspension Take 400 mg by mouth 4 (four) times daily.    [provider]  famotidine (PEPCID) 20 MG tablet Take 1 tablet (20 mg total) by mouth 2 (two) times daily. 06/30/19   Sharman Cheek, MD  folic acid (FOLVITE) 1 MG tablet Take 1 mg by mouth daily.    [provider]  hydroxyurea (HYDREA) 500 MG capsule Take 500 mg by mouth daily. May take with food to minimize GI side effects.    [provider]  lidocaine (LIDODERM) 5 % Place 1 patch onto the skin every 12 (twelve) hours. Remove & Discard patch within 12 hours or as directed by MD 08/03/19 08/02/20  Joni Reining, PA-C  naproxen  (NAPROSYN) 375 MG tablet Take 1 tablet (375 mg total) by mouth 2 (two) times daily with a meal. 08/03/19   Joni Reining, PA-C  phenytoin (DILANTIN) 100 MG ER capsule Take by mouth 3 (three) times daily.    [provider]    Family History Family History  Problem Relation Age of Onset  . Healthy Mother   . Healthy Father     Social History Social History   Tobacco Use  . Smoking status: Current Every Day Smoker    Packs/day: 0.25    Types: Cigarettes  . Smokeless tobacco: Never Used  Substance Use Topics  . Alcohol use: Not Currently  . Drug use: Never     Allergies   Patient has no known allergies.   Review of Systems Review of Systems  Constitutional: Negative.   Skin: Positive for wound.   Physical Exam Triage Vital Signs ED Triage Vitals  Enc Vitals Group     BP 08/22/19 1031 109/71     Pulse Rate 08/22/19 1031 61     Resp 08/22/19 1031 16     Temp 08/22/19 1031 97.7 F (36.5 C)     Temp Source 08/22/19 1031 Oral     SpO2 08/22/19 1031 97 %     Weight 08/22/19 1032 149 lb 14.6 oz (68 kg)     Height 08/22/19 1032 5\' 8"  (  1.727 m)     Head Circumference --      Peak Flow --      Pain Score 08/22/19 1032 0     Pain Loc --      Pain Edu? --      Excl. in [place]? --    Updated Vital Signs BP 109/71 (BP Location: Left Arm)   Pulse 61   Temp 97.7 F (36.5 C) (Oral)   Resp 16   Ht 5\' 8"  (1.727 m)   Wt 68 kg   SpO2 97%   BMI 22.79 kg/m   Visual Acuity Right Eye Distance:   Left Eye Distance:   Bilateral Distance:    Right Eye Near:   Left Eye Near:    Bilateral Near:     Physical Exam Constitutional:      General: He is not in acute distress.    Comments: Chronically ill appearing.  Eyes:     General: Scleral icterus present.  Skin:    Comments: Forehead laceration with surrounding eschar. No drainage.  Neurological:     Mental Status: He is alert.  Psychiatric:        Mood and Affect: Mood normal.        Behavior: Behavior  normal.    UC Treatments / Results  Labs (all labs ordered are listed, but only abnormal results are displayed) Labs Reviewed - No data to display  EKG   Radiology No results found.  Procedures Procedures (including critical care time) 4 sutures removed in standard fashion today without difficulty.  Medications Ordered in UC Medications - No data to display  Initial Impression / Assessment and Plan / UC Course  I have reviewed the triage vital signs and the nursing notes.  Pertinent labs & imaging results that were available during my care of the patient were reviewed by me and considered in my medical decision making (see chart for details).    48 year old male presents for suture removal.  Sutures removed without difficulty.  Supportive care.  Final Clinical Impressions(s) / UC Diagnoses   Final diagnoses:  Visit for suture removal   Discharge Instructions   None    ED Prescriptions    None     PDMP not reviewed this encounter.   Coral Spikes, [place] 08/22/19 1110

## 2019-08-22 NOTE — ED Triage Notes (Signed)
Pt reports he here for suture removal. Sutures placed in [facility] ED on 12/17 to forehead.

## 2019-08-28 NOTE — Unmapped (Signed)
[name] called to cancel delivery of Oxbryta and Procrit at this time.  Reports severe exhaustion/weakness/fatigue.  He is always sleepy and tired and feels these medications exacerbate symptoms.  Routing message to provider(s).    [name], PharmD  [facility]

## 2019-08-29 ENCOUNTER — Institutional Professional Consult (permissible substitution): Admit: 2019-08-29 | Discharge: 2019-08-30 | Payer: MEDICARE

## 2019-08-29 ENCOUNTER — Ambulatory Visit: Admit: 2019-08-29 | Discharge: 2019-08-30 | Payer: MEDICARE | Attending: Hematology | Primary: Hematology

## 2019-08-29 DIAGNOSIS — R270 Ataxia, unspecified: Principal | ICD-10-CM

## 2019-08-29 DIAGNOSIS — G40209 Localization-related (focal) (partial) symptomatic epilepsy and epileptic syndromes with complex partial seizures, not intractable, without status epilepticus: Principal | ICD-10-CM

## 2019-08-29 NOTE — Unmapped (Signed)
Patient Education        Epilepsy: Care Instructions  Your Care Instructions     Epilepsy is a common condition that causes repeated seizures. The seizures are caused by bursts of electrical activity in the brain that aren't normal. Seizures may cause problems with muscle control, movement, speech, vision, or awareness. They can be scary.  Epilepsy affects each person differently. Some people have only a few seizures. Others get them more often. If you know what triggers a seizure, you may be able to avoid having one.  You can take medicines to control and reduce seizures. You and your doctor will need to find the right combination, schedule, and dose of medicine. This may take time and careful changes.  Seizures may get worse and happen more often over time.  Follow-up care is a key part of your treatment and safety. Be sure to make and go to all appointments, and call your doctor if you are having problems. It's also a good idea to know your test results and keep a list of the medicines you take.  How can you care for yourself at home?  To control your seizures, you need to follow your treatment plan. If you take medicine to control seizures, you must take it exactly as prescribed.  The medicine works only if you take the right amount on the schedule your doctor sets up. Following this schedule keeps the right level of medicine in your body. Even missing just a few doses can allow seizures to happen.  You might be on a special ketogenic diet. If so, you'll need to follow the diet exactly for it to help prevent seizures.  As you follow your treatment plan, also try to figure out and avoid things that may make you more likely to have a seizure. These may include:  ?? Not getting enough sleep.  ?? Using drugs or alcohol.  ?? Being stressed.  ?? Skipping meals. If you keep having seizures despite treatment, keep a record of them. Note the date, time of day, and any details about the seizure that you can remember. Your doctor can use this information to plan or adjust your medicine or other treatment. The record can also help your doctor find out what kinds of seizures you are having.  If you have epilepsy:  ?? Be sure that any doctor who treats you knows that you have epilepsy. And let the doctor know what medicines you take, if any.  ?? Wear a medical ID bracelet. If you have a seizure or accident that leaves you unconscious or unable to speak for yourself, the bracelet will let those who are treating you know that you have epilepsy. It will also list any medicines you take to control your seizures. That way, you won't be given any medicines that will react badly with those already in your body.  ?? Ask your doctor if it's safe for you to do things like drive or swim.  ?? Create a seizure first-aid plan with your friends and family. The plan will help them know how to help you. The kind of plan you need can depend on the kind of seizures you have. Your doctor can tell you more about this.  When should you call for help?   Call 911 anytime you think you may need emergency care. For example, call if:  ?? ?? A seizure does not stop as it normally does.   ?? ?? You have new symptoms such as:  ?  Numbness, tingling, or weakness on one side of your body or face.  ? Vision changes.  ? Trouble speaking or thinking clearly.   Call your doctor now or seek immediate medical care if:  ?? ?? You have a fever.   ?? ?? You have a severe headache.   Watch closely for changes in your health, and be sure to contact your doctor if:  ?? ?? The normal pattern or features of your seizures change.   Where can you learn more?  Go to [facility] at https://myuncchart.org  Select Patient Education under [employer]. Enter X141 in the search box to learn more about Epilepsy: Care Instructions. Current as of: March 27, 2019??????????????????????????????Content Version: 12.7  ?? 2006-2020 [employer].   Care instructions adapted under license by [facility]. If you have questions about a medical condition or this instruction, always ask your healthcare professional. [employer], Incorporated disclaims any warranty or liability for your use of this information.
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
## 2019-09-04 ENCOUNTER — Ambulatory Visit: Admit: 2019-09-04 | Discharge: 2019-09-05 | Payer: MEDICARE | Attending: Internal Medicine | Primary: Internal Medicine

## 2019-09-04 DIAGNOSIS — G40209 Localization-related (focal) (partial) symptomatic epilepsy and epileptic syndromes with complex partial seizures, not intractable, without status epilepticus: Principal | ICD-10-CM

## 2019-09-04 DIAGNOSIS — R270 Ataxia, unspecified: Principal | ICD-10-CM

## 2019-09-04 DIAGNOSIS — D571 Sickle-cell disease without crisis: Principal | ICD-10-CM

## 2019-09-04 DIAGNOSIS — N189 Chronic kidney disease, unspecified: Principal | ICD-10-CM

## 2019-09-04 LAB — CBC W/ AUTO DIFF
BASOPHILS ABSOLUTE COUNT: 0 10*9/L (ref 0.0–0.1)
BASOPHILS RELATIVE PERCENT: 0.8 %
EOSINOPHILS RELATIVE PERCENT: 1.4 %
HEMATOCRIT: 16.5 % — ABNORMAL LOW (ref 41.0–53.0)
HEMOGLOBIN: 5.6 g/dL — ABNORMAL LOW (ref 13.5–17.5)
LARGE UNSTAINED CELLS: 4 % (ref 0–4)
LYMPHOCYTES ABSOLUTE COUNT: 1.9 10*9/L (ref 1.5–5.0)
LYMPHOCYTES RELATIVE PERCENT: 44.2 %
MEAN CORPUSCULAR HEMOGLOBIN CONC: 33.8 g/dL (ref 31.0–37.0)
MEAN CORPUSCULAR HEMOGLOBIN: 38.1 pg — ABNORMAL HIGH (ref 26.0–34.0)
MEAN CORPUSCULAR VOLUME: 112.7 fL — ABNORMAL HIGH (ref 80.0–100.0)
MEAN PLATELET VOLUME: 10.8 fL — ABNORMAL HIGH (ref 7.0–10.0)
MONOCYTES ABSOLUTE COUNT: 1.7 10*9/L — ABNORMAL HIGH (ref 0.2–0.8)
MONOCYTES RELATIVE PERCENT: 14.3 %
NEUTROPHILS ABSOLUTE COUNT: 2.1 10*9/L (ref 2.0–7.5)
NEUTROPHILS RELATIVE PERCENT: 35.4 %
NUCLEATED RED BLOOD CELLS: 11 /100{WBCs} — ABNORMAL HIGH (ref <=4)
PLATELET COUNT: 160 10*9/L (ref 150–440)
RED BLOOD CELL COUNT: 1.46 10*12/L — ABNORMAL LOW (ref 4.50–5.90)
WBC ADJUSTED: 5.8 10*9/L (ref 4.5–11.0)

## 2019-09-04 LAB — LARGE UNSTAINED CELLS: Lab: 4

## 2019-09-04 LAB — ALBUMIN QUANT URINE: Albumin:MCnc:Pt:Urine:Qn:: 2.3

## 2019-09-04 LAB — GAMMA GLUTAMYL TRANSFERASE: Gamma glutamyl transferase:CCnc:Pt:Ser/Plas:Qn:: 400 — ABNORMAL HIGH

## 2019-09-04 LAB — GIANT PLATELETS

## 2019-09-04 LAB — RETICULOCYTES: RETIC COUNT, MANUAL: 10.3 % — ABNORMAL HIGH (ref 0.8–2.0)

## 2019-09-04 LAB — ALBUMIN / CREATININE URINE RATIO
ALBUMIN QUANT URINE: 2.3 mg/dL
CREATININE, URINE: 68 mg/dL

## 2019-09-04 LAB — CARBAMAZEPINE LEVEL: Carbamazepine:MCnc:Pt:Ser/Plas:Qn:: 5.3

## 2019-09-04 LAB — SLIDE REVIEW

## 2019-09-04 LAB — CARBAMAZEPINE LEVEL, TOTAL: CARBAMAZEPINE LEVEL: 5.3 ug/mL (ref 4.0–12.0)

## 2019-09-04 LAB — PHENYTOIN LEVEL: Phenytoin:MCnc:Pt:Ser/Plas:Qn:: 13.8

## 2019-09-04 LAB — RETICULOCYTE ABSOLUTE COUNT, MANUAL: Lab: 156.6 — ABNORMAL HIGH

## 2019-09-04 NOTE — Unmapped (Signed)
Referring Provider: Scheryl Marten, MD     PCP:  Fredirick Maudlin, MD    09/04/2019     Chief Complaint: Sickle Cell Nephropathy     Background: His last admission was in April 2020, after presenting to hematology clinic and found to be hypoxic with a Hgb of 5.4. Covid swab was negative. Hypoxia likely secondary to anemia with little reserve, in setting of his underlying pulmonary hypertension. He was unable to receive blood transfusion given positive antibodies and hematology felt higher dose EPO should be tried to help with counts. He admits to missing occasional doses due to not wanting to stick himself with needle.     HPI:  [name] is a 49 y.o. year-old male with history of sickle cell anemia and hyperkalemia who presents for follow up visit. Since last visit on 05/08/2019, patient had several ED visits as detailed below. Carbamazepine level had been within normal limit when last checked on 03/07/19.     He was seen by hem-onc on 06/27/2019 at which time patient had potassium of 6.3 and was sent to the ED for further workup and treatment as needed. Potassium was 6.8 in the ED and EKG showed slightly peaked T waves noted in V2 through V4. Treated with IV fluids, albuterol, dextrose, insulin and calcium with a repeat potassium of 5.2. He was discharged from the ED on Kayexalate.     At same hem-onc visit, he also agreed to try low dose hydroxyurea 500 mg daily to see if this helped with his ongoing severe anemia.     He was involved in [facility] and seen at local ED on 07/03/19. No major injuries. However, he was instructed to hold hydroxyurea to allow for count recovery.      He was again seen in the ED on 08/09/19 after fall. CT was negative for intracranial bleed or fracture. Potassium was elevated at 6 and he was given dose of calcium and 10 units insulin prior to discharge. At follow up hem-onc appointment on 12/23, patient reported ongoing dizziness and stumbling with ambulation. He had virtual visit with neurology on 08/29/19 for evaluation of balance/ataxia issues. Requested anticonvulsant levels to ensure these were within normal range and that current symptoms were not medication related.     Patient's main complaint this morning is dizziness and lack of sleep. States his time clock hasn't been right since December and unable to go to sleep until 2 am every night. He remains intermittently compliant with wearing oxygen at night, stating the noise makes it difficult to sleep and the oxygen dries out his nares. Patient describes feeling dizzy, both when standing up as well as while just sitting in the chair. Dizziness resolves after sleeping for 3-4 hours. Patient also reports associated diplopia, however states this is not new and has experienced this since birth.      Currently, he is only taking his seizure medication. Concerned that difficulty sleeping is related to how many medications he is on. He has not been taking hydroxyurea or Epo injections since December, and has not started [employer]. Admits to taking Kayexalate occasionally, whenever he feels like it. His last dose was greater than a week ago. Denies any chest pain, shortness or breath or palpitations.     His balance has been a major issue. Due to this he has moved in with his mother and father. Today in the clinic he nearly fell in the hallway while trying to go to his clinic  room from the bathroom. He was escorted to the room in a wheelchair. He had difficulty remaining awake during the clinic visit as well when not interacting with anyone.    He denies any difficulty breathing, chest pain, fever, cough or chills. He has had no urinary symptoms.    ROS:   ROS is otherwise as noted in HPI; all other systems reviewed and are negative except as listed above.    MEDICATIONS:  Medication Sig   ??? acetaminophen (TYLENOL 8 HOUR) 650 MG CR tablet Take 650 mg by mouth every eight (8) hours as needed for pain. ??? carBAMazepine (TEGRETOL  XR) 400 MG 12 hr tablet Take 2 tablets (800 mg total) by mouth Two (2) times a day.   ??? cholecalciferol, vitamin D3, (VITAMIN D3) 10 mcg (400 unit) cap Take 1 capsule (400 Units total) by mouth daily.   ??? diclofenac sodium (VOLTAREN) 1 % gel Apply 2 g topically daily as needed for arthritis.   ??? empty container (SHARPS CONTAINER) Misc use as directed   ??? folic acid (FOLVITE) 1 MG tablet Take 1 tablet (1 mg total) by mouth daily.   ??? insulin syringe-needle U-100 (TRUEPLUS INSULIN) 1 mL 29 gauge x 1/2 (12 mm) Syrg 1 each by Miscellaneous route Two (2) times a week.   ??? lidocaine (LIDODERM) 5 % patch Place 1 patch on the skin.   ??? OXYGEN-AIR DELIVERY SYSTEMS MISC 2 L/min by Each Nare route nightly. [employer]   ??? sodium polystyrene (KAYEXALATE) powder Take 15 g by mouth Every Monday, Wednesday, and Friday.   ??? epoetin alfa (EPOGEN,PROCRIT) 40,000 unit/mL injection Inject 1 mL (40,000 Units total) under the skin Two (2) times a week. (Patient not taking: Reported on 09/04/2019)   ??? hydroxyurea (HYDREA) 500 mg capsule Take 1 capsule (500 mg total) by mouth daily. (Patient not taking: Reported on 09/04/2019)   ??? naproxen (NAPROSYN) 375 MG tablet TAKE 1 TABLET (375 MG TOTAL) BY MOUTH 2 (TWO) TIMES DAILY WITH A MEAL.   ??? nystatin-triamcinolone (MYCOLOG) 100,000-0.1 unit/gram-% ointment Apply topically Two (2) times a day. (Patient not taking: Reported on 09/04/2019)   ??? oxyCODONE (ROXICODONE) 5 MG immediate release tablet Take 1 tablet (5 mg total) by mouth every eight (8) hours as needed for pain for up to 10 doses. (Patient not taking: Reported on 09/04/2019)   ??? phenytoin (DILANTIN) 100 MG ER capsule Take 2 capsules (200 mg total) by mouth Two (2) times a day. (Patient not taking: Reported on 09/04/2019)   ??? promethazine (PHENERGAN) 12.5 MG tablet Take 12.5 mg by mouth every six (6) hours as needed for nausea. ??? voxelotor (OXBRYTA) 500 mg tablet Take 1 tablet (500 mg total) by mouth daily. (Patient not taking: Reported on 09/04/2019)     No current facility-administered medications for this visit.          ALLERGIES  Patient has no known allergies.    PAST MEDICAL HISTORY:  Past Medical History:   Diagnosis Date   ??? Anemia    ??? Foot pain    ??? 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 Hypertension    ??? Pulmonary hypertension (CMS-HCC)    ??? Seizures (CMS-HCC)    ??? Sickle cell anemia (CMS-HCC)    ??? Vitamin D deficiency        Past Surgical History:   Procedure Laterality Date   ??? PR RIGHT HEART CATH O2 SATURATION & CARDIAC OUTPUT N/A 07/18/2017    Procedure: Right Heart  Catheterization;  Surgeon: Zannie Cove, MD;  Location: [facility] CATH;  Service: Cardiology       PHYSICAL EXAM:  Vitals:    09/04/19 1038   BP: 123/59   Pulse: 65   Temp: 36.3 ??C (97.4 ??F)   SpO2: 94%     Physical Exam   Constitutional: No distress.   HENT:   Head: Normocephalic and atraumatic.   Eyes: Pupils are equal, round, and reactive to light. Scleral icterus is present.   Neck: Normal range of motion. Neck supple. No tracheal deviation present. No thyromegaly present.   Cardiovascular: Normal rate and regular rhythm.   Pulmonary/Chest: Effort normal and breath sounds normal. No respiratory distress. He has no wheezes.   Musculoskeletal:         General: No edema.   Neurological: He is alert.   Unable to complete a full neurologic exam. However patient did report diplopia. Attempted to perform finger-to-nose with both the left and right hand. He was able to do this fairly well although missed completely on a few occasions which he reports was due to his diplopia.   Skin: Skin is warm and dry. He is not diaphoretic.   Psychiatric: Affect normal.     MEDICAL DECISION MAKING  Office Visit on 09/04/2019   Component Date Value Ref Range Status   ??? Spec Gravity/POC 09/04/2019 1.015  1.003 - 1.030 Final   ??? PH/POC 09/04/2019 7.0  5.0 - 9.0 Final ??? Leuk Esterase/POC 09/04/2019 Negative  Negative Final   ??? Nitrite/POC 09/04/2019 Negative  Negative Final   ??? Protein/POC 09/04/2019 Negative  Negative Final   ??? UA Glucose/POC 09/04/2019 Negative  Negative Final   ??? Ketones, POC 09/04/2019 Negative  Negative Final   ??? Bilirubin/POC 09/04/2019 1+* Negative Final   ??? Blood/POC 09/04/2019 Negative  Negative Final   ??? Urobilinogen/POC 09/04/2019 4.0* 0.2 - 1.0 mg/dL Final       Creatinine   Date Value Ref Range Status   08/15/2019 0.85 0.70 - 1.30 mg/dL Final   13/03/6577 4.69 0.70 - 1.30 mg/dL Final   62/95/2841 3.24 0.70 - 1.30 mg/dL Final   40/05/2724 3.66 0.70 - 1.30 mg/dL Final   44/10/4740 5.95 0.70 - 1.30 mg/dL Final   63/87/5643 3.29 0.70 - 1.30 mg/dL Final   51/88/4166 0.63 0.70 - 1.30 mg/dL Final   01/60/1093 2.35 0.70 - 1.30 mg/dL Final   57/32/2025 4.27 0.70 - 1.30 mg/dL Final   02/13/7627 3.15 0.70 - 1.30 mg/dL Final   17/61/6073 7.10 0.70 - 1.30 mg/dL Final   62/69/4854 6.27 0.70 - 1.30 mg/dL Final   03/50/0938 1.82 0.70 - 1.30 mg/dL Final   99/37/1696 7.89 0.70 - 1.30 MG/DL Final   38/05/1750 0.25 0.70 - 1.30 MG/DL Final   85/27/7824 2.35 0.70 - 1.30 MG/DL Final   36/14/4315 4.00 0.70 - 1.30 MG/DL Final     Protein/Creatinine Ratio, Urine   Date Value Ref Range Status   05/08/2019 0.213 Undefined Final   10/19/2016 0.969 Undefined Final   06/15/2016 0.180 Undefined Final   11/04/2015 0.551 Undefined Final   07/24/2015 0.428 Undefined Final       ASSESSMENT/PLAN:  Mr.Julyan Meriel Pica is a 49 y.o. male with a past medical history significant for sickle cell anemia who returns for follow up visit. 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 His baseline creatinine now appears to be around 0.8 to 0.9 mg/dL which likely represents some loss of renal function over  the last several years. He has not been a candidate for ACE inhibitor or ARB therapy due to his persistent hyperkalemia. At present I'm not sure that any additional intervention is warranted at this time. Given that he has not been on his hydroxyurea or his Epogen we'll also plan to repeat his CBC today and discuss whether additional interventions might be needed if his hemoglobin level is much lower.    2. 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 Has not been taking his sodium polystyrene on a regular basis but also reports that he has not been eating very much either. We'll repeat with today's laboratory studies. In future could consider with the alternative agents for hyperkalemia.    3. Bone-mineral metabolism -  PTH minimally elevated in the past. We'll follow in 1 year's labs.    4. Seizures d/o - On dilantin and tegretol. Managed by Dr. Craig Staggers. Will obtain levels of both of these medications today given his current symptomatology.. 5. Gait instability and balance abnormalities -not sure the etiology of this. We'll obtain the anticonvulsant levels as noted above along with his CBC is possible his anemia if particularly worse could also contribute to his lethargy.. I spoke to his mother and they're also relatively uncomfortable with his current status; he is willing to be admitted to the hospital to work this up given its relative subacute change over the last few months. I spoke with Dr. Clarene Duke his hematologist who also concurs with this plan particularly given the difficulty in having him attend in person visits presently.. If his anticonvulsant levels are not particularly abnormal, he may benefit from an MRA/MRI of the brain to ensure they has not had neurologic events driving this. Additionally would plan to monitor his oxygen saturation overnight as his history of chronic hypoxemia could be contributing although his oxygen saturation here in the clinic today appears to be relatively normal on room air.    6. Health maintenance - will need flu vaccine when able.  Immunization History   Administered Date(s) Administered   ??? Hepatitis B, Adult 03/25/2009, 04/24/2009   ??? INFLUENZA TIV (TRI) PF (IM) 07/21/2009, 09/12/2012   ??? Influenza Vaccine Quad (IIV4 PF) 85mo+ injectable 07/04/2015, 05/07/2016, 04/20/2018, 06/27/2019   ??? Influenza Virus Vaccine, unspecified formulation 10/11/2018   ??? Meningococcal C Conjugate 03/27/2013   ??? PNEUMOCOCCAL POLYSACCHARIDE 23 03/25/2009, 11/11/2017   ??? Pneumococcal Conjugate 13-Valent 07/04/2015     Mr.Jag Windell Rizor will follow up in in 4 months or sooner if other indications arise. I have contacted the medical admitting officer and arrange admission today. The hospital is on apparent diversion and there are no beds available presently. I spoke with the patient's mother who is going to drive him home with the hope that a bed may be available before dark. She is unsure if she will be able to drive him back after dark but we could certainly try to pursue the admission tomorrow if not possible today.    Scribe's Attestation: Bess Kinds, MD obtained and performed the history, physical exam and medical decision making elements that were entered into the chart.  Signed by Jerl Santos, Scribe, on September 04, 2019 at 11:33 AM.    Attending Statement: Documentation assistance provided by the Scribe. I was present during the time the encounter was recorded. The information recorded by the Scribe was done at my direction and has been reviewed and validated by me with edits and additions as indicated.  Maryruth Hancock, MD  [facility] Division of Nephrology & Hypertension

## 2019-09-05 LAB — PHENYTOIN FREE: Phenytoin.free:MCnc:Pt:Ser/Plas:Qn:: 2.87

## 2019-09-05 MED ORDER — PHENYTOIN SODIUM EXTENDED 100 MG CAPSULE
ORAL_CAPSULE | 5 refills | 0 days | Status: CP
Start: 2019-09-05 — End: ?

## 2019-09-05 NOTE — Unmapped (Signed)
done

## 2019-09-06 LAB — VITAMIN D, TOTAL (25OH): Lab: 28.8

## 2019-09-06 NOTE — Unmapped (Signed)
Free phenytoin was elevated at 2.87. ??He will hold tonight's dose of Dilantin and subsequently reduce dosing to 1 AM and 2 PM. ??I will call them back in a few days and will check a follow-up phenytoin level in 1-2 weeks.

## 2019-09-07 DIAGNOSIS — G40209 Localization-related (focal) (partial) symptomatic epilepsy and epileptic syndromes with complex partial seizures, not intractable, without status epilepticus: Principal | ICD-10-CM

## 2019-09-07 NOTE — Unmapped (Signed)
Reached patient's mother who reported he is doing much better following the reduction in his Dilantin dosing.  Will plan to check f/u anticonvulsant levels in 1 week.

## 2019-09-11 NOTE — Unmapped (Signed)
[facility]   Patient Onboarding/Medication Counseling    [name] [name] is a 49 y.o. male with Sickle cell disease without crisis who I am counseling today on continuation of therapy.  I am speaking to the patient.    Was a Nurse, learning disability used for this call? No    Verified patient's date of birth / HIPAA.    Specialty medication(s) to be sent: Hematology/Oncology: Gardenia Phlegm      Non-specialty medications/supplies to be sent: none      Medications not needed at this time: Procrit/Syringes         Oxbryta (voxelotor)    Medication & Administration     Dosage: Take 1 tablet (500 mg total) by mouth daily.    Administration:   ? Take with or without food at approximately the same time every day   ? Discussed that tablets should be swallowed whole and do not chew, break or crush the tablet  ? May be administered with or without hydroxyurea    Adherence/Missed Doses  ? If a dose is missed skip that dose and go back to normal schedule  ? If you vomiting after taking a dose do not take another dose until next scheduled dose  ? Do not take two doses at the same time or extra doses  ? Record missed doses so our team is aware      Goals of Therapy     Treat sickle cell disease    Side Effects & Monitoring Parameters   ? Common side effects  ? Nausea  ? Abdominal pain  ? Diarrhea  ? Fatigue  ? Headache  ? Skin rash  ? Mild fever    ? The following side effects should be reported to the provider  ? Heartbeat that doesn't feel normal (heart feels like it's racing, skipping a beat or fluttering)  ? Signs of a liver problem (dark urine, yellowing of skin and/or eyes, fatigue, lack of appetite, nausea, abdominal pain, light colored stools, vomiting)  ? Signs of kidney problems (unable to pass urine, blood in urine, change in amount of urine passed, change in urine color, or weight gain) ? Signs of allergic reaction (rash; hives; swollen, blistered or peeling skin; wheezing; tightness in chest or throat; trouble breathing, swallowing or talking; unusual hoarseness; swelling of mouth, face, lips tongue or throat)    ? Monitoring parameters  ? Monitor hepatic function periodically  ? Monitor for signs/symptoms of hypersensitivity reactions  ? Monitor adherence      Warnings & Precautions     ? Concerns related to adverse effects:     Serious hypersensitivity reactions have occurred rarely but discontinue voxelotor if        a hypersensitivity reaction occurs and manage as clinically necessary.  Do      not reinitiate in patients who developed a hypersensitivity reaction with prior exposure.   ?  Disease related concerns:                  Hepatic impairment: severe hepatic impairment increases voxelotor exposure.                   Reduce dose in patients with severe (Child Pugh class C) impairment  ? Concurrent drug therapy issues:                  Potential significant drug-drug interactions may exist, requiring dose or frequency  adjustments, additional monitoring, and/or alternative therapy  ? Laboratory test interference with high performance liquid chromatography measurement of Hb subtypes (HbA, HbS, HbF) Chromatography should be performed when voxelotor is not being administered if precise Hb quantitation is required.  ? Tell your doctor if you are pregnant or plan on getting pregnant. You will need to talk about the benefits and risks of using this drug while you are pregnant.  ? It is not known if voxelotor is present in breast milk therefore it is not recommended during treatment and for at least 2 weeks after the last dose.    Drug/Food Interactions     ? Medication list reviewed in Epic. The patient was instructed to inform the care team before taking any new medications or supplements. Phenytoin may decrease effects of voxelotor.  Pt has been on previously.. Storage, Handling Precautions, & Disposal     ? This medication should be stored at room temperature. Keep out of reach of others including children and pets. Keep the medicine in the original container with a child-proof top (no pillboxes) and protected from light. Do not throw away or flush unused medication down the toilet or sink. This drug is considered hazardous and should be handled as little as possible.  If someone else helps with medication administration, they should wear gloves.        Current Medications (including OTC/herbals), Comorbidities and Allergies     Current Outpatient Medications   Medication Sig Dispense Refill   ??? acetaminophen (TYLENOL 8 HOUR) 650 MG CR tablet Take 650 mg by mouth every eight (8) hours as needed for pain.     ??? carBAMazepine (TEGRETOL  XR) 400 MG 12 hr tablet Take 2 tablets (800 mg total) by mouth Two (2) times a day. 120 tablet 11   ??? cholecalciferol, vitamin D3, (VITAMIN D3) 10 mcg (400 unit) cap Take 1 capsule (400 Units total) by mouth daily. 30 each 0   ??? diclofenac sodium (VOLTAREN) 1 % gel Apply 2 g topically daily as needed for arthritis. 100 g prn   ??? empty container (SHARPS CONTAINER) Misc use as directed 1 each 2   ??? epoetin alfa (EPOGEN,PROCRIT) 40,000 unit/mL injection Inject 1 mL (40,000 Units total) under the skin Two (2) times a week. (Patient not taking: Reported on 09/04/2019) 8 mL 3   ??? folic acid (FOLVITE) 1 MG tablet Take 1 tablet (1 mg total) by mouth daily. 30 tablet 3   ??? hydroxyurea (HYDREA) 500 mg capsule Take 1 capsule (500 mg total) by mouth daily. (Patient not taking: Reported on 09/04/2019) 30 capsule 0   ??? insulin syringe-needle U-100 (TRUEPLUS INSULIN) 1 mL 29 gauge x 1/2 (12 mm) Syrg 1 each by Miscellaneous route Two (2) times a week. 24 each 3   ??? lidocaine (LIDODERM) 5 % patch Place 1 patch on the skin.     ??? naproxen (NAPROSYN) 375 MG tablet TAKE 1 TABLET (375 MG TOTAL) BY MOUTH 2 (TWO) TIMES DAILY WITH A MEAL. ??? nystatin-triamcinolone (MYCOLOG) 100,000-0.1 unit/gram-% ointment Apply topically Two (2) times a day. (Patient not taking: Reported on 09/04/2019) 30 g 0   ??? oxyCODONE (ROXICODONE) 5 MG immediate release tablet Take 1 tablet (5 mg total) by mouth every eight (8) hours as needed for pain for up to 10 doses. (Patient not taking: Reported on 09/04/2019) 10 tablet 0   ??? OXYGEN-AIR DELIVERY SYSTEMS MISC 2 L/min by Each Nare route nightly. [employer]     ??? phenytoin (DILANTIN)  100 MG ER capsule Take 1 in the morning and 2 in the evening 90 capsule 5   ??? promethazine (PHENERGAN) 12.5 MG tablet Take 12.5 mg by mouth every six (6) hours as needed for nausea.     ??? sodium polystyrene (KAYEXALATE) powder Take 15 g by mouth Every Monday, Wednesday, and Friday. 454 g 2   ??? voxelotor (OXBRYTA) 500 mg tablet Take 1 tablet (500 mg total) by mouth daily. (Patient not taking: Reported on 09/04/2019) 30 tablet 0     No current facility-administered medications for this visit.        No Known Allergies    Patient Active Problem List   Diagnosis   ??? Hyperpotassemia   ??? 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 Sickle cell anemia (CMS-HCC)   ??? Hyperkalemia   ??? Proteinuria   ??? Vitamin D deficiency   ??? Seizure disorder (CMS-HCC)   ??? Pain of left hip joint   ??? Ear infection   ??? Sickle cell crisis (CMS-HCC)   ??? Pulmonary HTN (CMS-HCC)   ??? Anemia   ??? Edema   ??? Hypoxemia   ??? Tobacco use disorder   ??? High output heart failure (CMS-HCC)   ??? Vertigo   ??? Hyperbilirubinemia   ??? Nausea & vomiting       Reviewed and up to date in Epic.    Appropriateness of Therapy     Is medication and dose appropriate based on diagnosis? Yes    Prescription has been clinically reviewed: Yes    Baseline Quality of Life Assessment      How many days over the past month did your sickle cell keep you from your normal activities? declined answer    Financial Information     Medication Assistance provided: None Required Anticipated copay of $0 reviewed with patient. Verified delivery address.    Delivery Information     Scheduled delivery date: 09/13/19    Expected start date: 09/13/19    Medication will be delivered via Next Day Courier to the prescription address in [facility].  This shipment will not require a signature.      Explained the services we provide at [facility] and that each month we would call to set up refills.  Stressed importance of returning phone calls so that we could ensure they receive their medications in time each month.  Informed patient that we should be setting up refills 7-10 days prior to when they will run out of medication.  A pharmacist will reach out to perform a clinical assessment periodically.  Informed patient that a welcome packet and a drug information handout will be sent.      Patient verbalized understanding of the above information as well as how to contact the pharmacy at [phone] option 4 with any questions/concerns.  The pharmacy is open Monday through Friday 8:30am-4:30pm.  A pharmacist is available 24/7 via pager to answer any clinical questions they may have.    Patient Specific Needs     ? Does the patient have any physical, cognitive, or cultural barriers? no    ? Patient prefers to have medications discussed with  Patient or mother     ? Is the patient or caregiver able to read and understand education materials at a high school level or above? Yes    ? Patient's primary language is  English     ? Is the patient high risk? No     ?  Does the patient require a Care Management Plan? No     ? Does the patient require physician intervention or other additional services (i.e. nutrition, smoking cessation, social work)? No      [name] [name] [facility] Specialty Pharmacist

## 2019-09-12 NOTE — Unmapped (Signed)
Clinical Pharmacist Practitioner: [facility]     [name] [name] is a 49 y.o. male with hgb SS disease who I called today for follow-up of epoetin and resuming voxelotor.    Plan: (per Dr Clarene Duke)  -resume voxelotor at 500mg  once daily  -continue epoetin 40,000 units Monday and Thursday      [name] [name] verbalized understanding of the plan provided, and via teachback and had no further questions.     ___________________________________________________________________    Interim history:  Patient reported following:  -feeling much better since reduction of phenytoin dose  -agreeable to resume voxelotor at 500mg  once daily      Medication access: [facility], copay $0    Adherence:   Patient reported missing 0 doses of epoetin since last clinic visit  Patient has a pillbox yes    Approximate telephone time spent with patient: 13 minutes     [name], PharmD, BCOP, CPP  Clinical Pharmacist Practitioner, Benign Hematology                TEST INTERVAL Baseline Wk 0 ??Wk 1 Wk 3    Voxelotor start date: wed 02/28/19, stopped 7/29             02/21/19     7/10 ??    7/15     7/29 09/04/19   Insurance approval ?? Yes, copay $0 ?? ?? ??     Structured ROS (AE: diarrhea, headache, adherence, drug interactions) Every visit Diarrhea ?? no ??no no n/a     Nausea  no yes yes n/a     Headache ?? no ??no no n/a     Missed doses ?? no ??no no n/a     Drug interactions ?? yes ??no no yes   Contraception Every visit ?? yes ??yes yes n/a   Baseline and every 6months ?? pending ??7/30 pending pending   CBCD/ retic  Pre, 2 weeks and monthly Wbc 7.0 5.6 ??4.9 4.9 5.8     Hgb 5.8 5.6 ??5.6 6.3 5.6     Plt 212 206 ??167 181 160     retic 187 134 ??173 170 156.6   CMP  Baseline and monthly  Scr 0.63 0.83 0.93 0.85 0.85     Tbili 5.3 4.1 ??4.5 5.4 7.3     Dbili 1.7 1.9 2.4 2.6 4.7     AST 146 119 ??124 130 102     ALT 29 23 ??24 24 30    LDH Baseline and monthly 4516 4422 ??4183 4460 2324   BNP Baseline and monthly 870 (4/20) ?? 790?? - Promise Fatigue SF (7 items) Baseline and every 2 months  33 ?? ?? -    ASCQ-Me Pain impact SF Baseline and every 2 months  ??  25 -    ASCQ-ME Pain Interference SF Baseline and every 2 months                40.7 ?? ?? -    Number of transfused RBC units OR  Interval of RBCV exchange transfusion Monthly                 none ??none none?? none none

## 2019-09-13 MED FILL — OXBRYTA 500 MG TABLET: 30 days supply | Qty: 30 | Fill #0 | Status: AC

## 2019-09-14 ENCOUNTER — Ambulatory Visit: Admit: 2019-09-14 | Discharge: 2019-09-15 | Payer: MEDICARE

## 2019-09-14 DIAGNOSIS — G40209 Localization-related (focal) (partial) symptomatic epilepsy and epileptic syndromes with complex partial seizures, not intractable, without status epilepticus: Principal | ICD-10-CM

## 2019-09-14 LAB — COMPREHENSIVE METABOLIC PANEL
ALBUMIN: 4.2 g/dL (ref 3.5–5.0)
ALKALINE PHOSPHATASE: 365 U/L — ABNORMAL HIGH (ref 38–126)
ALT (SGPT): 31 U/L (ref <50)
ANION GAP: 14 mmol/L (ref 7–15)
AST (SGOT): 129 U/L — ABNORMAL HIGH (ref 19–55)
BILIRUBIN TOTAL: 6.7 mg/dL — ABNORMAL HIGH (ref 0.0–1.2)
BUN / CREAT RATIO: 27
CALCIUM: 9.2 mg/dL (ref 8.5–10.2)
CHLORIDE: 109 mmol/L — ABNORMAL HIGH (ref 98–107)
CO2: 19 mmol/L — ABNORMAL LOW (ref 22.0–30.0)
CREATININE: 0.73 mg/dL (ref 0.70–1.30)
EGFR CKD-EPI NON-AA MALE: >90 mL/min/{1.73_m2} (ref >=60)
GLUCOSE RANDOM: 101 mg/dL (ref 70–179)
POTASSIUM: 5 mmol/L (ref 3.5–5.0)
SODIUM: 142 mmol/L (ref 135–145)

## 2019-09-14 LAB — PHENOBARBITAL LEVEL: Phenobarbital:MCnc:Pt:Urine:Qn:Confirm: <3 — ABNORMAL LOW

## 2019-09-14 LAB — PHENYTOIN LEVEL: Phenytoin:MCnc:Pt:Ser/Plas:Qn:: 7 — ABNORMAL LOW

## 2019-09-14 LAB — SLIDE REVIEW

## 2019-09-14 LAB — CBC W/ AUTO DIFF
BASOPHILS ABSOLUTE COUNT: 0.1 10*9/L (ref 0.0–0.1)
BASOPHILS RELATIVE PERCENT: 1.2 %
EOSINOPHILS RELATIVE PERCENT: 1.3 %
HEMATOCRIT: 19.4 % — ABNORMAL LOW (ref 41.0–53.0)
HEMOGLOBIN: 6.5 g/dL — ABNORMAL LOW (ref 13.5–17.5)
LARGE UNSTAINED CELLS: 4 % (ref 0–4)
LYMPHOCYTES ABSOLUTE COUNT: 2.9 10*9/L (ref 1.5–5.0)
MEAN CORPUSCULAR HEMOGLOBIN CONC: 33.3 g/dL (ref 31.0–37.0)
MEAN CORPUSCULAR HEMOGLOBIN: 39.4 pg — ABNORMAL HIGH (ref 26.0–34.0)
MEAN PLATELET VOLUME: 11.1 fL — ABNORMAL HIGH (ref 7.0–10.0)
MONOCYTES ABSOLUTE COUNT: 0.8 10*9/L (ref 0.2–0.8)
MONOCYTES RELATIVE PERCENT: 11.8 %
NEUTROPHILS ABSOLUTE COUNT: 2.6 10*9/L (ref 2.0–7.5)
NEUTROPHILS RELATIVE PERCENT: 38.3 %
NUCLEATED RED BLOOD CELLS: 12 /100{WBCs} — ABNORMAL HIGH (ref <=4)
PLATELET COUNT: 187 10*9/L (ref 150–440)
RED BLOOD CELL COUNT: 1.64 10*12/L — ABNORMAL LOW (ref 4.50–5.90)
RED CELL DISTRIBUTION WIDTH: 21.8 % — ABNORMAL HIGH (ref 12.0–15.0)
WBC ADJUSTED: 6.8 10*9/L (ref 4.5–11.0)

## 2019-09-14 LAB — BILIRUBIN TOTAL: Bilirubin:MCnc:Pt:Ser/Plas:Qn:: 6.7 — ABNORMAL HIGH

## 2019-09-14 LAB — LACTATE DEHYDROGENASE: Lactate dehydrogenase:CCnc:Pt:Ser/Plas:Qn:Reaction: pyruvate to lactate: 3129 — ABNORMAL HIGH

## 2019-09-14 LAB — CARBAMAZEPINE LEVEL: Carbamazepine:MCnc:Pt:Ser/Plas:Qn:: 7.2

## 2019-09-14 LAB — RETICULOCYTE ABSOLUTE COUNT, MANUAL: Lab: 209.6 — ABNORMAL HIGH

## 2019-09-14 LAB — HOWELL-JOLLY BODIES

## 2019-09-14 LAB — VARIABLE HEMOGLOBIN CONCENTRATION

## 2019-09-14 LAB — BILIRUBIN DIRECT: Bilirubin.glucuronidated+Bilirubin.albumin bound:MCnc:Pt:Ser/Plas:Qn:: 3.5 — ABNORMAL HIGH

## 2019-09-15 LAB — PHENYTOIN FREE: Phenytoin.free:MCnc:Pt:Ser/Plas:Qn:: 1.01

## 2019-09-17 NOTE — Unmapped (Signed)
[name] [name] with [facility] contacted clinic in regards to mutual patient. [name] [name] called to follow up on the requested form needed for home health care.     * Notified [name] [name] that form is awaiting Dr. Fredirick Maudlin signature. [name] [name] states she will give clinic a call back Wednesday.

## 2019-09-20 NOTE — Unmapped (Signed)
[facility]  [address] [place], [place] [address]    [phone]    This note is to let you know that [name] [name] did not show for their scheduled Physical Therapy Evaluation.  Please contact me if you have any questions or concerns.    Thank you for this referral,     Signed: Dolores Lory, PT  09/20/2019 11:23 AM

## 2019-09-20 NOTE — Unmapped (Deleted)
[facility]  OUTPATIENT PHYSICAL THERAPY  09/20/2019           Patient Name: [name] [name]  Date of Birth:Jan 05, 1971  Diagnosis: No diagnosis found.  Referring MD:  Scheryl Marten, MD     Plan of Care Effective Date:       Assessment & Plan               Personal Factors/Comorbidities: 3+  Specific Comorbidities: Hb-SS disease, HTN, seizures, anemia                            Subjective     History of Present Illness          Reason for Referral/Chief Complaint: Pain associated with sickle cell anemia                                          Objective                             Both the patient and I were wearing masks, PT was wearing protective eye shield.    I attest that I have reviewed the above information.  Signed: Dolores Lory, PT  09/20/2019 7:29 AM

## 2019-09-26 ENCOUNTER — Ambulatory Visit: Admit: 2019-09-26 | Discharge: 2019-09-26 | Payer: MEDICARE

## 2019-09-26 DIAGNOSIS — D571 Sickle-cell disease without crisis: Principal | ICD-10-CM

## 2019-09-26 LAB — COMPREHENSIVE METABOLIC PANEL
ALKALINE PHOSPHATASE: 341 U/L — ABNORMAL HIGH (ref 38–126)
ALT (SGPT): 29 U/L (ref <50)
ANION GAP: 5 mmol/L — ABNORMAL LOW (ref 7–15)
AST (SGOT): 133 U/L — ABNORMAL HIGH (ref 17–47)
BILIRUBIN TOTAL: 7.1 mg/dL — ABNORMAL HIGH (ref 0.0–1.2)
BLOOD UREA NITROGEN: 16 mg/dL (ref 7–21)
BUN / CREAT RATIO: 22
CALCIUM: 8.8 mg/dL (ref 8.5–10.2)
CHLORIDE: 109 mmol/L — ABNORMAL HIGH (ref 98–107)
CO2: 24 mmol/L (ref 22.0–30.0)
CREATININE: 0.73 mg/dL (ref 0.70–1.30)
EGFR CKD-EPI AA MALE: >90 mL/min/{1.73_m2} (ref >=60)
EGFR CKD-EPI NON-AA MALE: >90 mL/min/{1.73_m2} (ref >=60)
GLUCOSE RANDOM: 91 mg/dL (ref 70–179)
POTASSIUM: 5.5 mmol/L — ABNORMAL HIGH (ref 3.5–5.0)
PROTEIN TOTAL: 8.9 g/dL — ABNORMAL HIGH (ref 6.5–8.3)
SODIUM: 138 mmol/L (ref 135–145)

## 2019-09-26 LAB — MANUAL DIFFERENTIAL
BASOPHILS - ABS (DIFF): 0 10*9/L (ref 0.0–0.1)
BASOPHILS - REL (DIFF): 0 %
EOSINOPHILS - ABS (DIFF): 0.1 10*9/L (ref 0.0–0.4)
EOSINOPHILS - REL (DIFF): 1 %
LYMPHOCYTES - ABS (DIFF): 2.4 10*9/L (ref 1.5–5.0)
LYMPHOCYTES - REL (DIFF): 47 %
MONOCYTES - REL (DIFF): 16 %
NEUTROPHILS - ABS (DIFF): 1.8 10*9/L — ABNORMAL LOW (ref 2.0–7.5)

## 2019-09-26 LAB — CBC W/ AUTO DIFF
HEMATOCRIT: 20.5 % — CL (ref 41.0–53.0)
HEMOGLOBIN: 6.5 g/dL — CL (ref 13.5–17.5)
MEAN CORPUSCULAR HEMOGLOBIN CONC: 31.8 g/dL (ref 31.0–37.0)
MEAN CORPUSCULAR HEMOGLOBIN: 36.3 pg — ABNORMAL HIGH (ref 26.0–34.0)
MEAN CORPUSCULAR VOLUME: 114.3 fL — ABNORMAL HIGH (ref 80.0–100.0)
MEAN PLATELET VOLUME: 8.9 fL (ref 7.0–10.0)
PLATELET COUNT: 133 10*9/L — ABNORMAL LOW (ref 150–440)
RED BLOOD CELL COUNT: 1.79 10*12/L — ABNORMAL LOW (ref 4.50–5.90)
RED CELL DISTRIBUTION WIDTH: 20.6 % — ABNORMAL HIGH (ref 12.0–15.0)
WBC ADJUSTED: 5 10*9/L (ref 4.5–11.0)

## 2019-09-26 LAB — BILIRUBIN DIRECT: Bilirubin.glucuronidated+Bilirubin.albumin bound:MCnc:Pt:Ser/Plas:Qn:: 4 — ABNORMAL HIGH

## 2019-09-26 LAB — RETICULOCYTES
RETIC COUNT, MANUAL: 9 % — ABNORMAL HIGH (ref 0.8–2.0)
RETICULOCYTE ABSOLUTE COUNT, MANUAL: 161.1 10*9/L — ABNORMAL HIGH (ref 27.0–120.0)

## 2019-09-26 LAB — MEAN PLATELET VOLUME: Platelet mean volume:EntVol:Pt:Bld:Qn:Automated count: 8.9

## 2019-09-26 LAB — RETIC COUNT, MANUAL: Lab: 9 — ABNORMAL HIGH

## 2019-09-26 LAB — BUN / CREAT RATIO: Urea nitrogen/Creatinine:MRto:Pt:Ser/Plas:Qn:: 22

## 2019-09-26 LAB — BASOPHILS - REL (DIFF): Basophils/100 leukocytes:NFr:Pt:Bld:Qn:Manual count: 0

## 2019-09-26 NOTE — Unmapped (Signed)
AUDIOLOGIC EVALUATION    [PAIN 0/10]. [name] [name] presents to [facility] for an audiologic evaluation. Patient is being seen in conjunction with Dr. Juanetta Gosling.    HISTORY: [name] [name] is a 49 y.o. male with a history of borderline normal hearing sensitivity.  He reports today that his hearing has decreased since he was last evaluated on 01/18/2017. He reports that approximately one month ago, he lost his hearing suddenly in the RIGHT ear. He denies otalgia, otorrhea, aural fullness, and noise exposure. He endorses some dizziness from his medication, which he takes for epilepsy.     RESULTS: Today's results were obtained using insert headphones with good reliability.    Otoscopy revealed significant cerumen buildup in the LEFT ear.     RIGHT ear: hearing within normal limits to a mild sensorineural hearing loss  Speech Reception Threshold: 25 dB HL  Word Recognition Score: 96% @ 65 dB HL (using Recorded NU-6 words with masking in contralateral ear)    LEFT ear: Hearing within normal limits to a mild sensorineural hearing loss  Speech Reception Threshold: 20 dB HL  Word Recognition Score: 96% @ 65 dB HL (using Recorded NU-6 words with masking in contralateral ear)    Tympanometry was administered today to assess middle ear status.  RIGHT ear: Consistent with normal middle ear function (Type A)  LEFT ear: Consistent with normal middle ear function (Type A)    Today's results are unchanged when compared with last audiogram on 01/18/2017.     *SEE AUDIOGRAM IMAGE IN MEDIA TAB*    RECOMMENDATIONS:  ??? ENT - refer to ENT for cerumen removal in the LEFT ear and concerns for RIGHT ear hearing status  ??? Re-evaluate per MD or sooner with any changes or concerns regarding hearing status    Mila Palmer, [place]  [employer]    Edsel Petrin, B.A.  [facility] Doctoral Student    I was physically present and immediately available to direct and supervise tasks that were related to patient management. The direction and supervision was continuous throughout the time these tasks were performed.    We wore appropriate PPE throughout entire appointment (face mask and eye protection). Patient also wore face mask appropriately for the entire appointment.    Tommie Raymond, AuD  Clinical Audiologist

## 2019-09-26 NOTE — Unmapped (Signed)
[facility] Clinical Assessment & Refill Coordination Note    Patient requests syringes with orange cap. [facility] no longer stocks syringe with orange cap.  Not able to dispense.Will send appropriate in stock needle/syringe for Procrit    [name] [name], DOB: [birth year]  Phone: [phone] (home)     All above HIPAA information was verified with patient.     Was a Nurse, learning disability used for this call? No    Specialty Medication(s):   Hematology/Oncology: Gardenia Phlegm and Procrit     Current Outpatient Medications   Medication Sig Dispense Refill   ??? acetaminophen (TYLENOL 8 HOUR) 650 MG CR tablet Take 650 mg by mouth every eight (8) hours as needed for pain.     ??? carBAMazepine (TEGRETOL  XR) 400 MG 12 hr tablet Take 2 tablets (800 mg total) by mouth Two (2) times a day. 120 tablet 11   ??? cholecalciferol, vitamin D3, (VITAMIN D3) 10 mcg (400 unit) cap Take 1 capsule (400 Units total) by mouth daily. 30 each 0   ??? diclofenac sodium (VOLTAREN) 1 % gel Apply 2 g topically daily as needed for arthritis. 100 g prn   ??? empty container (SHARPS CONTAINER) Misc use as directed 1 each 2   ??? epoetin alfa (EPOGEN,PROCRIT) 40,000 unit/mL injection Inject 1 mL (40,000 Units total) under the skin Two (2) times a week. (Patient not taking: Reported on 09/04/2019) 8 mL 3   ??? folic acid (FOLVITE) 1 MG tablet Take 1 tablet (1 mg total) by mouth daily. 30 tablet 3   ??? hydroxyurea (HYDREA) 500 mg capsule Take 1 capsule (500 mg total) by mouth daily. (Patient not taking: Reported on 09/04/2019) 30 capsule 0   ??? insulin syringe-needle U-100 (TRUEPLUS INSULIN) 1 mL 29 gauge x 1/2 (12 mm) Syrg 1 each by Miscellaneous route Two (2) times a week. 24 each 3   ??? lidocaine (LIDODERM) 5 % patch Place 1 patch on the skin.     ??? naproxen (NAPROSYN) 375 MG tablet TAKE 1 TABLET (375 MG TOTAL) BY MOUTH 2 (TWO) TIMES DAILY WITH A MEAL.     ??? nystatin-triamcinolone (MYCOLOG) 100,000-0.1 unit/gram-% ointment Apply topically Two (2) times a day. (Patient not taking: Reported on 09/04/2019) 30 g 0   ??? oxyCODONE (ROXICODONE) 5 MG immediate release tablet Take 1 tablet (5 mg total) by mouth every eight (8) hours as needed for pain for up to 10 doses. (Patient not taking: Reported on 09/04/2019) 10 tablet 0   ??? OXYGEN-AIR DELIVERY SYSTEMS MISC 2 L/min by Each Nare route nightly. [employer]     ??? phenytoin (DILANTIN) 100 MG ER capsule Take 1 in the morning and 2 in the evening 90 capsule 5   ??? promethazine (PHENERGAN) 12.5 MG tablet Take 12.5 mg by mouth every six (6) hours as needed for nausea.     ??? voxelotor (OXBRYTA) 500 mg tablet Take 1 tablet (500 mg total) by mouth daily. (Patient not taking: Reported on 09/04/2019) 30 tablet 0     No current facility-administered medications for this visit.         Changes to medications: [name] reports no changes at this time.    No Known Allergies    Changes to allergies: No    SPECIALTY MEDICATION ADHERENCE     Oxbryta 500 mg: 14 days of medicine on hand   Procrit 40,000 Units: 14 days of medicine on hand     Medication Adherence    Patient reported  X missed doses in the last month: 0  Specialty Medication: Gardenia Phlegm  Patient is on additional specialty medications: Yes  Additional Specialty Medications: Procrit  Patient Reported Additional Medication X Missed Doses in the Last Month: 0  Informant: patient  Provider-estimated medication adherence level: variable  Reasons for non-adherence: patient forgets   Other non-adherence reason: Pt sometimes feels medication is making him feel worse   Confirmed plan for next specialty medication refill: delivery by pharmacy  Refills needed for supportive medications: yes, ordered or provider notified          Specialty medication(s) dose(s) confirmed: Regimen is correct and unchanged.     Are there any concerns with adherence? No    Adherence counseling provided? Not needed    CLINICAL MANAGEMENT AND INTERVENTION      Clinical Benefit Assessment:    Do you feel the medicine is effective or helping your condition? Yes    Clinical Benefit counseling provided? Not needed    Adverse Effects Assessment:    Are you experiencing any side effects? No    Are you experiencing difficulty administering your medicine? No    Quality of Life Assessment:    How many days over the past month did your sickle cell disease keep you from your normal activities? For example, brushing your teeth or getting up in the morning. 0    Have you discussed this with your provider? Not needed    Therapy Appropriateness:    Is therapy appropriate? Yes, therapy is appropriate and should be continued    DISEASE/MEDICATION-SPECIFIC INFORMATION      N/A    PATIENT SPECIFIC NEEDS     ? Does the patient have any physical, cognitive, or cultural barriers? No    ? Is the patient high risk? No     ? Does the patient require a Care Management Plan? No     ? Does the patient require physician intervention or other additional services (i.e. nutrition, smoking cessation, social work)? No      SHIPPING     Specialty Medication(s) to be Shipped:   Hematology/Oncology: Gardenia Phlegm and Procrit    Other medication(s) to be shipped: syringes     Changes to insurance: No    Delivery Scheduled: Yes, Expected medication delivery date: 10/09/19.  However, Rx request for refills was sent to the provider as there are none remaining.     Medication will be delivered via Same Day Courier to the confirmed prescription address in [facility].    The patient will receive a drug information handout for each medication shipped and additional FDA Medication Guides as required.  Verified that patient has previously received a Conservation officer, historic buildings.    All of the patient's questions and concerns have been addressed.    [name] [facility] Specialty Pharmacist

## 2019-09-27 MED ORDER — INSULIN SYRINGE U-100 WITH NEEDLE 1 ML 29 GAUGE X 1/2" (12 MM)
3 refills | 84.00000 days | Status: CP
Start: 2019-09-27 — End: ?

## 2019-09-27 MED ORDER — VOXELOTOR 500 MG TABLET
ORAL_TABLET | Freq: Every day | ORAL | 11 refills | 30 days | Status: CP
Start: 2019-09-27 — End: ?
  Filled 2019-10-09: qty 30, 30d supply, fill #0

## 2019-09-27 NOTE — Unmapped (Signed)
Requesting for syringe with  27g, 1/2

## 2019-10-09 MED FILL — BD TUBERCULIN SYRINGE 1 ML 27 X 1/2": 28 days supply | Qty: 8 | Fill #0

## 2019-10-09 MED FILL — PROCRIT 40,000 UNIT/ML INJECTION SOLUTION: SUBCUTANEOUS | 28 days supply | Qty: 8 | Fill #3

## 2019-10-09 MED FILL — PROCRIT 40,000 UNIT/ML INJECTION SOLUTION: 28 days supply | Qty: 8 | Fill #3 | Status: AC

## 2019-10-09 MED FILL — OXBRYTA 500 MG TABLET: 30 days supply | Qty: 30 | Fill #0 | Status: AC

## 2019-10-09 MED FILL — BD TUBERCULIN SYRINGE 1 ML 27 X 1/2": 28 days supply | Qty: 8 | Fill #0 | Status: AC

## 2019-10-10 ENCOUNTER — Ambulatory Visit: Admit: 2019-10-10 | Discharge: 2019-10-11 | Payer: MEDICARE | Attending: Hematology | Primary: Hematology

## 2019-10-10 DIAGNOSIS — D571 Sickle-cell disease without crisis: Principal | ICD-10-CM

## 2019-10-10 DIAGNOSIS — H9190 Unspecified hearing loss, unspecified ear: Principal | ICD-10-CM

## 2019-10-10 NOTE — Unmapped (Addendum)
It was a pleasure to see you today in clinic!    Plan from today's visit:   1. Continue taking your voxelotor   2. You do not need to take your hydrea   3. We will continue your procrit injections 2 times per week to help your hemoglobin  4. We will get labs today before you leave for monitoring   5. If you can't get in to see a local doctor about your ears let us know and we will have you see the ear, nose, and throat doctors to further evaluate issues you are having with your right ear   6. We will see you back in two months!    How to get in touch with your Sickle Cell Team:     1. If you are sick, and need an ambulance, CALL 911.    ? Otherwise, call [facility] at [phone], 8AM - 5PM, and ask for the NURSE TRIAGE LINE and leave a message   ? On nights and weekends, for urgent problems you can call the [phone] and ask for the HEMATOLOGIST ON CALL.     2.  For an appointment, please call [facility] at [phone], 8AM - 5PM.    3.  For pain medication refills, leave a message on nurse triage line THREE days before refill needed. All other medication refills ask your pharmacy to fax request.       4.  For general questions, call the sickle cell office, [phone]. You can leave non-urgent messages here as well.    5.  For social work questions or needs; Please call Dickie La RN, MSW, MPH at [phone]    Information about Coronavirus Prevention for Patients with Sickle Cell Disease.    What you can do to protect yourself???    Avoid close contact with people who are sick.  No handshakes or kissing.  No sharing of utensils.  Keep a distance of at least 6 feet apart.  Wash your hands often with soap and water for at least 20 seconds.  Cover your coughs and sneezes with a tissue and then throw the tissue in the trash.  Avoid touching your eyes, nose and mouth.  Clean and disinfect frequently touched objects and surfaces.    What we can do to help you???    Avoid going directly to the ED for pain or minor complaints The ED is very crowded and you may be exposed to the virus and/or your care may be delayed.  We may be able to treat your pain in clinic instead of the ED, on a case-by-case basis. Call us at [facility] at [phone], 8AM - 5PM, to discuss    Stay home when you are sick and call us for additional support.    1. If you are sick, and need an ambulance, CALL 911.     2. During regular office hours, Monday to Friday (8am- 5pm), call [facility] at [phone], 8AM - 5PM, and ask for the NURSE TRIAGE LINE and leave a message    3. On nights and weekends, for urgent problems you can call the [phone] and ask for the HEMATOLOGIST ON CALL.    4. If you feel you may have been exposed to COVID-19, please reach out to your primary care provider or the sickle cell team via telephone.  For more information please refer to:    [email or web address]    or the COVID Helpline: 9318629628.     [Based on a template from [place]  [place] at [facility] in the [place]]

## 2019-10-10 NOTE — Unmapped (Signed)
[facility] Follow-up Note      Primary Care Physician:   Fredirick Maudlin, MD    Reason for Visit:  Follow up sickle cell anemia, Hgb SS    Assessment/Recommendations:   [name] [name] is a 49 y.o. year old African-American male who returns for follow-up of sickle cell anemia.      1.  Sickle cell anemia, Hgb SS: Severe anemia with macrocytosis. Hgb nadir of 4.5 roughly one year ago slowly improved since that time and 6.5 as of most recent labs. Has intermittently been on hydroxyurea, though since discontinued with re-starting of voxeletor last month (did previously have a borderline ANC (1.2) while on hydroxyurea). He has tolerated this without issue thus far (previously had to discontinue due to diarrhea).  From a symptomatic standpoint, the PROMIS pain-interference score has been downtrending which suggests his disease symptoms are improved (most recent 36-40).  He continues on 40K Procrit twice weekly and had extensive discussion with patient today need to continue as Hgb has stabilized somewhat and patient feeling improved. Would also be reasonable to re-start his hydrea, though patient interested in limiting pill burden and will not push at this time given he is feeling much better from before. He will continue on voxelotor 500mg  daily. He unfortunately left today without going to the lab for his lab draw and will need patient to return within the next week for lab monitoring (particularly from a liver standpoint with worsening bilirubinemia while on the voxelotor).   - Continue with folic acid daily  - Resumed voxelotor 500mg  daily 1/19; will continue at present dosing   - Continue epo 40K units twice weekly  - Continue to hold hydroxyurea for now, consider resuming at next visit  - discuss with patient returning to lab in coming week for labs not drawn today    2. Pulmonary HTN / Hypoxia: Wearing his oxygen more consistently at night now that he has received a new machine from his oxygen company.  Reiterated  the importance of treating his nocturnal hypoxia to avoid end organ toxicity in light of his severe anemia. Most recent outpatient echo 09/25/2018 TRV 3.1, LA dilatation. Ejection fraction 60-65%, PASP 53.  - Continue oxygen as prescribed. Currently prescribed 2-4L at night; adherence improving.   - Can use vaseline around the nares for dryness while on oxygen    3. Proteinuria with intermittent hyperkalemia and low blood pressure in the past: Patient not a candidate for ACE inhibitor or ARB at present.   Follows outpatient with Nephrology (last seen 09/04/19) with no additional interventions recommended at this time. He is not taking his Kayexelate consistently and will need to monitor his potassium levels closely given issues in the past (notes that he is using prn when he has foods higher in potassium).   - f/u K levels on pending labs     4. Unsteadiness, falls: Resolved. Work-up had been notable for supratherapeutic phenytoin levels, that have now normalized with dose reduction of his phenytoin. No further symptoms since that time. He also continues on his carbamazepine. Will plan for repeat phenytoin levels today to ensure patient remains therapeutic. Will discuss with Neurology team whether transitioning to a single drug regimen (ie Keppra) might be reasonable to limit pill burden and also reduce the potential for medication induced hepatotoxicity. He has however been seizure free over the last few years on this regimen.   - f/u total and free phenytoin levels  - consider transitioning off  carbamazepine and phenytoin    4. Health maintenance:   Immunization History   Administered Date(s) Administered   ??? Hepatitis B, Adult 03/25/2009, 04/24/2009   ??? INFLUENZA TIV (TRI) PF (IM) 07/21/2009, 09/12/2012   ??? Influenza Vaccine Quad (IIV4 PF) 23mo+ injectable 07/04/2015, 05/07/2016, 04/20/2018, 06/27/2019   ??? Influenza Virus Vaccine, unspecified formulation 10/11/2018   ??? Meningococcal C Conjugate 03/27/2013   ??? PNEUMOCOCCAL POLYSACCHARIDE 23 03/25/2009, 11/11/2017   ??? Pneumococcal Conjugate 13-Valent 07/04/2015   Hep B sAb neg in 2017 (although previously immunized?). Untransfusable, so may not be a priority at present.  Will also need to think about starting Twinrix series in near future.     5. Seizures, last time 3-4 years ago, still on Tegretol and Dilantin. Discussed as above.     6. Erectile dysfunction: Previously endorsed issues with erectile dysfunction, but no issues currently (or with priapism).     7. Right sided hearing loss: Reports symptoms over the course of the past month, though recent audiology testing reassuring with only evidence of stable, mild sensorineural hearing loss on right side (comparable to 2018 testing). He does have some cerumen buildup that may be contributing. Patient to discuss with local providers possibility of ear irrigation. If unable to do so will plan on referral to ENT.     FOLLOW UP: 2 months with labs.     Patient seen and discussed with Dr. Clarene Duke.     Jannet Mantis, MD, PhD  Hematology/Oncology Fellow    History of Present Illness:   [name] [name] is a 49 y.o. year old African-American male with Hgb SS who is here for follow up. His disease is characterized by severe anemia and difficult to transfuse due to alloantibodies. Has not tolerated disease modifying therapy in the past, though re-started volexotor since his last visit (08/15/19) which patient has tolerated thus far without issue.     He reports since he was seen last he has been feeling much better, especially now that his phenytoin dose has been modified. He no longer feels as drowsy and his ambulation has improved with no recent falls. He continues on his Procrit twice weekly but does not like the injections and is hoping he can come off at some point. He has not missed any recent doses. He has also been taking his volexotor as prescribed and has not had any GI symptoms as of yet. He stopped his hydrea when he started the volexotor in the middle of January. He has not had any issues with pain since last seen. Reports that he has been using his nighttime oxygen regularly as he was able to get a new bubbler which isn't as noisy. He does not feel as though it has improved his sleep significantly. He denies any fever, chills, or other infectious symptoms.      Sickle Cell Disease History:  Access: peripheral IV  1.  Genotype: SS (hemolysis: hemoglobin 6 , ARC 100-200 , LDH 3000-5000 , Bilirubin 3-5 , urobilinogen 0.2-2 )  Concomitant alpha thal: ?  Other genetic studies: no  BMT?:  no  Folic acid: yes  Hydroxyurea: was on 500mg /day but with mild neutropenia (1200) and minimal F elevation; patient with h/o telling different stories to different providers - in setting of neutropenia and renal dysfunction and minimal improvement in %F, we have not resumed Hydrea.  Started on Voxelator on 7/8 for hyperhemolysis, but discontinued per pt request secondary to nausea/vomiting. Had re-started Hydrea (500mg /day) 08/15/19 but discontinued 09/11/19 upon  start of Volexotor (500mg  daily) which patient continues on at present.   Studies/protocols: no   2. Pain (typical location): rare - legs, feet  Hospitalization frequency:  rare  Outpatient regimen: tylenol  24h Morphine Equivalent Dose (Goal <100): 0  Bowel regimen: none  Inpatient regimen: ?   2. Transfusions: yes per previous notes but on 11/11/17 patient said 'never'  Life time blood transfusions: 6+  Last transfusion: 02/2018   History of DHTR?: unlikely  Alloantibodies: no  Iron status: ferritin 259 (11/11/17)  Medications: no   3.  CV/Pulm: chronic hypoxia ( improved on HU per previous notes), supposed to be on o2 at night (2LPM); on 11/11/17 states he can 'walk 1/2 mile without getting short of breath'. Unable to complete PFTs on 03/21/19 due to in office fall. More consistent use of nightly oxygen of late.  Acute chest syndrome: no  Need for mechanical ventilation?: no  Pulmonary hypertension: yes, h/o hypoxia - NYHA 2 and ? WHO group 5; RHC 06/2017 - mild PxHTN with PAmean = 26, PCW mean of 16 and increased CO, normal PVR 1.2 wood units      4. Neuro:  Stroke/TIA: no  Imaging:     Other: epilepsy diagnosed at 49yo, on carbamazepine and phenytoin; follows regularly with neurology   5. Psych: cognitive delay  Concerns for depression/anxiety?: no  Concerns for substance dependence?: no  Medications:    6. ID/Immune:  Splenectomy?: no  Antibiotics: no  Immunization history:   Immunization History   Administered Date(s) Administered   ??? Hepatitis B, Adult 03/25/2009, 04/24/2009   ??? INFLUENZA TIV (TRI) PF (IM) 07/21/2009, 09/12/2012   ??? Influenza Vaccine Quad (IIV4 PF) 55mo+ injectable 07/04/2015, 05/07/2016, 04/20/2018, 06/27/2019   ??? Influenza Virus Vaccine, unspecified formulation 10/11/2018   ??? Meningococcal C Conjugate 03/27/2013   ??? PNEUMOCOCCAL POLYSACCHARIDE 23 03/25/2009, 11/11/2017   ??? Pneumococcal Conjugate 13-Valent 07/04/2015     ANA: positive 1:160 on 06/2017  HIV neg 06/2017  Hep BsAb neg 04/2016   7. GI/Hep:  Cholecystectomy?: no  Hepatopathy?: ? - enlarged on u/s 01/2016  Other: no   8. 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 GU:  Priapism?: yes, remote - last at 49yo per notes   10. M/sk:  AVN?: no  Ulcers?: no  Other: left iliac crest fracture (? 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 Endocrine:  Bone/vitamin D: on Vitamin D supplementation   Other:    13. Screening:   Ophtho/Retinopathy?: neg 09/2017  Audiology?: yes  Dental?: yes   14. Social: watches TV all day, lives alone, drives  Education: high school   Work/Disability:   Smoking: cigars 2x/day  Alcohol:   Other: Surgical History:  Past Surgical History:   Procedure Laterality Date   ??? PR RIGHT HEART CATH O2 SATURATION & CARDIAC OUTPUT N/A 07/18/2017    Procedure: Right Heart Catheterization;  Surgeon: Zannie Cove, MD;  Location: [facility] CATH;  Service: Cardiology     Medications:   Current Outpatient Medications   Medication Sig Dispense Refill   ??? acetaminophen (TYLENOL 8 HOUR) 650 MG CR tablet Take 650 mg by mouth every eight (8) hours as needed for pain.     ???  carBAMazepine (TEGRETOL  XR) 400 MG 12 hr tablet Take 2 tablets (800 mg total) by mouth Two (2) times a day. 120 tablet 11   ??? cholecalciferol, vitamin D3, (VITAMIN D3) 10 mcg (400 unit) cap Take 1 capsule (400 Units total) by mouth daily. 30 each 0   ??? diclofenac sodium (VOLTAREN) 1 % gel Apply 2 g topically daily as needed for arthritis. 100 g prn   ??? empty container (SHARPS CONTAINER) Misc use as directed 1 each 2   ??? epoetin alfa (EPOGEN,PROCRIT) 40,000 unit/mL injection Inject 1 mL (40,000 Units total) under the skin Two (2) times a week. 8 mL 3   ??? folic acid (FOLVITE) 1 MG tablet Take 1 tablet (1 mg total) by mouth daily. 30 tablet 3   ??? hydroxyurea (HYDREA) 500 mg capsule Take 1 capsule (500 mg total) by mouth daily. 30 capsule 0   ??? lidocaine (LIDODERM) 5 % patch Place 1 patch on the skin.     ??? naproxen (NAPROSYN) 375 MG tablet TAKE 1 TABLET (375 MG TOTAL) BY MOUTH 2 (TWO) TIMES DAILY WITH A MEAL.     ??? nystatin-triamcinolone (MYCOLOG) 100,000-0.1 unit/gram-% ointment Apply topically Two (2) times a day. 30 g 0   ??? OXYGEN-AIR DELIVERY SYSTEMS MISC 2 L/min by Each Nare route nightly. [employer]     ??? promethazine (PHENERGAN) 12.5 MG tablet Take 12.5 mg by mouth every six (6) hours as needed for nausea.     ??? syringe with needle (BD TUBERCULIN SYRINGE) 1 mL 27 x 1/2 Syrg Use as directed twice weekly 24 each 3   ??? oxyCODONE (ROXICODONE) 5 MG immediate release tablet Take 1 tablet (5 mg total) by mouth every eight (8) hours as needed for pain for up to 10 doses. (Patient not taking: Reported on 09/04/2019) 10 tablet 0   ??? phenytoin (DILANTIN) 100 MG ER capsule Take 1 in the morning and 2 in the evening (Patient not taking: Reported on 10/10/2019) 90 capsule 5   ??? voxelotor (OXBRYTA) 500 mg tablet Take 1 tablet (500 mg total) by mouth daily. (Patient not taking: Reported on 10/10/2019) 30 tablet 11     No current facility-administered medications for this visit.      Allergies:  Patient has no known allergies.    Social History:  Social History     Socioeconomic History   ??? Marital status: Single     Spouse name: None   ??? Number of children: 0   ??? Years of education: None   ??? Highest education level: None   Occupational History   ??? Occupation: disability   Social Needs   ??? Financial resource strain: None   ??? Food insecurity     Worry: None     Inability: None   ??? Transportation needs     Medical: None     Non-medical: None   Tobacco Use   ??? Smoking status: Current Some Day Smoker     Packs/day: 1.00     Years: 0.50     Pack years: 0.50     Types: Cigars   ??? Smokeless tobacco: Never Used   ??? Tobacco comment: Pt smokes 1-2 cigars daily, Pt intends to cease tobacco use    Substance and Sexual Activity   ??? Alcohol use: Not Currently     Alcohol/week: 0.0 standard drinks     Drinks per session: 1 or 2     Binge frequency: Never     Comment: occasional   ???  Drug use: No   ??? Sexual activity: None   Lifestyle   ??? Physical activity     Days per week: None     Minutes per session: None   ??? Stress: None   Relationships   ??? Social Wellsite geologist on phone: None     Gets together: None     Attends religious service: None     Active member of club or organization: None     Attends meetings of clubs or organizations: None     Relationship status: None   Other Topics Concern   ??? None   Social History Narrative   ??? None     Family History:  family history includes Clotting disorder in his paternal grandmother; Hypertension in his father and mother; No Known Problems in his brother, brother, brother, brother, maternal aunt, maternal grandfather, maternal grandmother, maternal uncle, paternal aunt, paternal grandfather, paternal uncle, sister, sister, and another family member; Sickle cell anemia in his sister.   He indicated that his mother is alive. He indicated that his father is alive. He indicated that two of his three sisters are alive. He indicated that all of his four brothers are alive. He indicated that the status of his maternal grandmother is unknown. He indicated that the status of his maternal grandfather is unknown. He indicated that the status of his paternal grandmother is unknown. He indicated that the status of his paternal grandfather is unknown. He indicated that the status of his maternal aunt is unknown. He indicated that the status of his maternal uncle is unknown. He indicated that the status of his paternal aunt is unknown. He indicated that the status of his paternal uncle is unknown. He indicated that the status of his neg hx is unknown. He indicated that the status of his other is unknown.    Review of Systems:  As per HPI, otherwise negative x 10 systems.    Objective :   Vitals:    10/10/19 1224   BP: 112/54   Pulse: 65   Resp: 15   Temp: 36.9 ??C (98.5 ??F)   SpO2: 94%   Height: 167.6 cm (5' 6)     Physical Exam:  Gen: in NAD, very pleasant   HEENT: Scleral and sublingual icterus.   CV: Normal rate, regular rhythm, systolic murmur appreciated.   Pulm: CTAB.   GI - Soft, non-tender, non-distended. Liver edge barely palpable on deep palpation. No appreciable splenomegaly.   Skin - No rashes or lesions.   Ext: Clubbing in fingers. No lower extremity edema. No joint effusions or tenderness on palpation. FROM of joints.   Neuro - Alert, oriented. No focal deficits on exam. Gait generally steady, less so on direction change with rightward lean.   Psych - Normal affect    MOCA self assessment was 37/40.     Test Results:  Results for orders placed or performed in visit on 09/26/19   Bilirubin, Direct   Result Value Ref Range    Bilirubin, Direct 4.00 (H) 0.00 - 0.40 mg/dL   Reticulocytes   Result Value Ref Range    Reticulocyte Manual % 9.0 (H) 0.8 - 2.0 %    Absolute Manual Reticulocyte 161.1 (H) 27.0 - 120.0 10*9/L   Comprehensive Metabolic Panel   Result Value Ref Range    Sodium 138 135 - 145 mmol/L    Potassium 5.5 (H) 3.5 - 5.0 mmol/L    Chloride 109 (H) 98 - 107  mmol/L    Anion Gap 5 (L) 7 - 15 mmol/L    CO2 24.0 22.0 - 30.0 mmol/L    BUN 16 7 - 21 mg/dL    Creatinine 8.46 9.62 - 1.30 mg/dL    BUN/Creatinine Ratio 22     EGFR CKD-EPI Non-African American, Male >90 >=60 mL/min/1.15m2    EGFR CKD-EPI African American, Male >90 >=60 mL/min/1.35m2    Glucose 91 70 - 179 mg/dL    Calcium 8.8 8.5 - 95.2 mg/dL    Albumin 3.8 3.5 - 5.0 g/dL    Total Protein 8.9 (H) 6.5 - 8.3 g/dL    Total Bilirubin 7.1 (H) 0.0 - 1.2 mg/dL    AST 841 (H) 17 - 47 U/L    ALT 29 <50 U/L    Alkaline Phosphatase 341 (H) 38 - 126 U/L   CBC w/ Differential   Result Value Ref Range    Results Verified by Slide Scan Slide Reviewed     WBC 5.0 4.5 - 11.0 10*9/L    RBC 1.79 (L) 4.50 - 5.90 10*12/L    HGB 6.5 (LL) 13.5 - 17.5 g/dL    HCT 32.4 (LL) 40.1 - 53.0 %    MCV 114.3 (H) 80.0 - 100.0 fL    MCH 36.3 (H) 26.0 - 34.0 pg    MCHC 31.8 31.0 - 37.0 g/dL    RDW 02.7 (H) 25.3 - 15.0 %    MPV 8.9 7.0 - 10.0 fL    Platelet 133 (L) 150 - 440 10*9/L    nRBC 12 (H) <=4 /100 WBCs    Variable HGB Concentration Moderate (A) Not Present    Macrocytosis Marked (A) Not Present    Anisocytosis Moderate (A) Not Present    Hypochromasia Moderate (A) Not Present   Manual Differential   Result Value Ref Range    Neutrophils % 36 %    Lymphocytes % 47 %    Monocytes % 16 %    Eosinophils % 1 %    Basophils % 0 %    Absolute Neutrophils 1.8 (L) 2.0 - 7.5 10*9/L    Absolute Lymphocytes 2.4 1.5 - 5.0 10*9/L    Absolute Monocytes 0.8 0.2 - 0.8 10*9/L    Absolute Eosinophils 0.1 0.0 - 0.4 10*9/L    Absolute Basophils 0.0 0.0 - 0.1 10*9/L Smear Review Comments See Comment (A) Undefined    Polychromasia Moderate (A) Not Present    Target Cells Moderate (A) Not Present    Hypersegmented Neutrophils Present (A) Not Present    Poikilocytosis Moderate (A) Not Present         Ricard Dillon, MD    10/10/19 12:31 PM

## 2019-10-10 NOTE — Unmapped (Signed)
Clinical Pharmacist Practitioner: [facility]     [name] [name] is a 49 y.o. male with sickle cell anemia who I am seeing today for follow-up of voxelotor, epoetin.    Plan:  -Continue voxelotor 500mg  once daily, epoetin 40,000 units twice weekly on mondays, thursdays    Counseled patient to numb the injection area with ice cube for 5 minutes, before injection.    [name] [name] verbalized understanding of the education provided and had no further questions.     ___________________________________________________________________    Interim epoetin history:  -Had not missed any doses, no adherence tools, just remembers  -the new syringe and 27g needle a little better than before, but still a little pain with every injection    Epoetin dosing history:  Epoetin started on 02/07/19 during hospital admission 4/15-4/22/20  Epoetin 20,000 units two times weekly (4/22-7/13/20)  Epoetin 40,000 units two times weekly (7/15 - present)     Voxelotor Review  Adherence:   Patient reported missing 0 doses in the past month  Patient has a pillbox yes    Drug-drug interaction  New medications: none   Medications discontinued: none  Potential drug, herb or dietary supplement interactions phenytoin, carbamazepine  -per patient phenytoin dose reduced from 200mg  bid to 100mg  am, 200mg  pm      Side Effects   Headache: no   Diarrhea: no   Nausea no Emesis no  Others none    TEST INTERVAL Baseline Wk 0 ??Wk 1 Wk 3 09/04/19 09/14/19 09/26/19 10/10/19   Voxelotor start date:??wed 02/28/19 ????????????????????????02/21/19 ????????7/10 ??    7/15     7/29 Resumed 09/11/19 at 500mg  daily   Week 4   Insurance approval ?? Yes, copay $0 ?? ?? ?? ?? Yes, copay $0    Structured ROS (AE: diarrhea, headache, adherence, drug interactions) Every visit Diarrhea ?? no ??no no no   no     Nausea ?? no yes yes no   no     Headache ?? no ??no no no   no     Missed doses ?? no ??no no n/a   no     Drug interactions ?? yes ??no no yes   yes   Contraception Every visit ?? yes ??yes yes n/a   n/a Baseline and every 6months ?? pending ??7/30 pending pending   pending   CBCD/ retic  Pre, 2 weeks and monthly Wbc 7.0 5.6 ??4.9 4.9 5.8 6.8 5.0      Hgb 5.8 5.6 ??5.6 6.3 5.6 6.5 6.5      Plt 212 206 ??167 181 160 187 133      retic 187 134 ??173 170 156 209 161    CMP  Baseline and monthly  Scr 0.63 0.83 0.93 0.85  0.73 0.73      Tbili 5.3 4.1 ??4.5 5.4  6.7 7.1      Dbili 1.7 1.9 2.4 2.6  3.5 4.0      AST 146 119 ??124 130  129 133      ALT 29 23 ??24 24  31 29     LDH Baseline and monthly 4516 4422 ??4183 4460  3129     BNP Baseline and monthly 870 (4/20) ?? 790?? -       Promise Fatigue SF (7 items) Baseline and every 2 months?? 33 ?? ?? -       ASCQ-Me Pain impact SF Baseline and every 2 months  ?? ?? 25 -  ASCQ-ME Pain Interference SF Baseline and every 2 months ??????????????????????????????40.7 ?? ?? -       Number of transfused RBC units OR  Interval of RBCV exchange transfusion Monthly?? ??????????????????????????????none ??none none?? none    none       Approximate face time spent with patient: 10 minutes     [name], PharmD, BCOP, CPP  Clinical Pharmacist Practitioner, [facility]

## 2019-10-24 NOTE — Unmapped (Signed)
I spoke to patient.  He said he felt dizzy yesterday and was wondering if it was related to decrease in seizure medication.  Today, he is feeling better.  I discussed hydration and changing positions slowly and advised patient to call back if he had any more concerns.  His number is [phone].

## 2019-10-25 NOTE — Unmapped (Signed)
I talked to the patient.  He reported he felt dizzy yesterday.  He is not dizzy today.  He is taking his Dilantin as directed.  He was to get labs last month at [facility] but only a limited specimen could be obtained per the patient.  He did not want to repeat lab work.  I told him to let us know if he has any recurrence of his dizziness and we would likely check some labs.

## 2019-11-17 ENCOUNTER — Ambulatory Visit: Payer: Medicare Other | Attending: Internal Medicine

## 2019-11-17 DIAGNOSIS — Z23 Encounter for immunization: Secondary | ICD-10-CM

## 2019-11-17 NOTE — Progress Notes (Signed)
   Covid-19 Vaccination Clinic  Name:  Andrew Wilkinson    MRN: [id number] DOB: 1970-11-17  11/17/2019  [name] was observed post Covid-19 immunization for 15 minutes without incident. He was provided with Vaccine Information Sheet and instruction to access the V-Safe system.   [name] was instructed to call 911 with any severe reactions post vaccine: Marland Kitchen Difficulty breathing  . Swelling of face and throat  . A fast heartbeat  . A bad rash all over body  . Dizziness and weakness   Immunizations Administered    Name Date Dose VIS Date Route   Pfizer COVID-19 Vaccine 11/17/2019  3:23 PM 0.3 mL 08/03/2019 Intramuscular   Manufacturer: [employer], [employer]   Lot: [id number]   NDC: [address]-4

## 2019-11-19 DIAGNOSIS — D571 Sickle-cell disease without crisis: Principal | ICD-10-CM

## 2019-11-19 NOTE — Unmapped (Signed)
[facility] Refill Coordination Note    Specialty Medication(s) to be Shipped:   General Specialty: Procrit 40,000units/ml & BD tuberculin Syringes 1ml  **Patient denied refills on Oxbryta 500mg  due to still having a month on hand**     [name] [name], DOB: October 25, 1970  Phone: [phone] (home)     All above HIPAA information was verified with patient.     Was a Nurse, learning disability used for this call? No    Completed refill call assessment today to schedule patient's medication shipment from the [facility] [phone]).       Specialty medication(s) and dose(s) confirmed: Regimen is correct and unchanged.   Changes to medications: [name] reports no changes at this time.  Changes to insurance: No  Questions for the pharmacist: No    Confirmed patient received Welcome Packet with first shipment. The patient will receive a drug information handout for each medication shipped and additional FDA Medication Guides as required.       DISEASE/MEDICATION-SPECIFIC INFORMATION        N/A    SPECIALTY MEDICATION ADHERENCE     Medication Adherence    Patient reported X missed doses in the last month: 0  Specialty Medication: Oxbryta 500mg   Patient is on additional specialty medications: Yes  Additional Specialty Medications: Procrit 40,000 unit/mL Inj  Patient Reported Additional Medication X Missed Doses in the Last Month: 0  Patient is on more than two specialty medications: No  Informant: patient  Reliability of informant: reliable        Procrit 40,000units/ml : 0 days of medicine on hand (Next dose due on 11/22/19)  Oxbryta 500mg : 34 days of medicine on hand    SHIPPING     Shipping address confirmed in Epic.     Delivery Scheduled: Yes, Expected medication delivery date: 11/21/2019.     Medication will be delivered via Same Day Courier to the prescription address in Epic WAM.    Jaylianna Tatlock P Allena Katz   [facility] Specialty Technician

## 2019-11-21 MED FILL — BD TUBERCULIN SYRINGE 1 ML 27 X 1/2": 28 days supply | Qty: 8 | Fill #1

## 2019-11-21 MED FILL — PROCRIT 40,000 UNIT/ML INJECTION SOLUTION: 28 days supply | Qty: 8 | Fill #0 | Status: AC

## 2019-11-21 MED FILL — BD TUBERCULIN SYRINGE 1 ML 27 X 1/2": 28 days supply | Qty: 8 | Fill #1 | Status: AC

## 2019-11-21 MED FILL — PROCRIT 40,000 UNIT/ML INJECTION SOLUTION: SUBCUTANEOUS | 28 days supply | Qty: 8 | Fill #0

## 2019-11-22 MED ORDER — EPOETIN ALFA 40,000 UNIT/ML INJECTION SOLUTION
SUBCUTANEOUS | 3 refills | 28.00000 days | Status: CP
Start: 2019-11-22 — End: 2020-11-21
  Filled 2019-12-19: qty 8, 28d supply, fill #1

## 2019-12-10 DIAGNOSIS — G40909 Epilepsy, unspecified, not intractable, without status epilepticus: Principal | ICD-10-CM

## 2019-12-12 NOTE — Unmapped (Signed)
[facility] Refill Coordination Note    Specialty Medication(s) to be Shipped:   Hematology/Oncology: [place] Phlegm and Procrit    Other medication(s) to be shipped: syringes     [name] [name], DOB: 02/09/1971  Phone: [phone] (home)       All above HIPAA information was verified with patient.     Was a Nurse, learning disability used for this call? No    Completed refill call assessment today to schedule patient's medication shipment from the [facility] [phone]).       Specialty medication(s) and dose(s) confirmed: Regimen is correct and unchanged.   Changes to medications: [name] reports no changes at this time.  Changes to insurance: No  Questions for the pharmacist: No    Confirmed patient received Welcome Packet with first shipment. The patient will receive a drug information handout for each medication shipped and additional FDA Medication Guides as required.       DISEASE/MEDICATION-SPECIFIC INFORMATION        For patients on injectable medications: Patient currently has 2 doses left.  Next injection is scheduled for 4/22.    SPECIALTY MEDICATION ADHERENCE     Medication Adherence    Patient reported X missed doses in the last month: 0  Specialty Medication: Oxbryta 500mg   Patient is on additional specialty medications: Yes  Additional Specialty Medications: Procrit 40,000 unit/mL  Patient Reported Additional Medication X Missed Doses in the Last Month: 0  Patient is on more than two specialty medications: No  Any gaps in refill history greater than 2 weeks in the last 3 months: no  Demonstrates understanding of importance of adherence: yes  Informant: patient                [place] 500mg : Patient has 7 days of medication on hand    Procrit 40,000unit/ml: Patient has 7 days of medication on hand      SHIPPING     Shipping address confirmed in Epic.     Delivery Scheduled: Yes, Expected medication delivery date: 4/28.     Medication will be delivered via Same Day Courier to the prescription address in Epic WAM.    Olga Millers   [facility] Specialty Technician

## 2019-12-14 ENCOUNTER — Other Ambulatory Visit: Payer: Self-pay

## 2019-12-14 ENCOUNTER — Ambulatory Visit: Payer: Medicare Other | Attending: Internal Medicine

## 2019-12-14 DIAGNOSIS — Z23 Encounter for immunization: Secondary | ICD-10-CM

## 2019-12-14 NOTE — Progress Notes (Signed)
   Covid-19 Vaccination Clinic  Name:  Andrew Wilkinson    MRN: [id number] DOB: Jan 13, 1971  12/14/2019  Andrew Wilkinson was observed post Covid-19 immunization for 30 minutes based on pre-vaccination screening without incident. He was provided with Vaccine Information Sheet and instruction to access the V-Safe system.   Andrew Wilkinson was instructed to call 911 with any severe reactions post vaccine: Marland Kitchen Difficulty breathing  . Swelling of face and throat  . A fast heartbeat  . A bad rash all over body  . Dizziness and weakness   Immunizations Administered    Name Date Dose VIS Date Route   Pfizer COVID-19 Vaccine 12/14/2019  3:21 PM 0.3 mL 10/17/2018 Intramuscular   Manufacturer: [employer], [employer]   Lot: [id number]   NDC: [address]-8

## 2019-12-18 DIAGNOSIS — D571 Sickle-cell disease without crisis: Principal | ICD-10-CM

## 2019-12-19 MED FILL — OXBRYTA 500 MG TABLET: 30 days supply | Qty: 30 | Fill #1 | Status: AC

## 2019-12-19 MED FILL — BD TUBERCULIN SYRINGE 1 ML 27 X 1/2": 28 days supply | Qty: 8 | Fill #2 | Status: AC

## 2019-12-19 MED FILL — OXBRYTA 500 MG TABLET: ORAL | 30 days supply | Qty: 30 | Fill #1

## 2019-12-19 MED FILL — BD TUBERCULIN SYRINGE 1 ML 27 X 1/2": 28 days supply | Qty: 8 | Fill #2

## 2019-12-19 MED FILL — PROCRIT 40,000 UNIT/ML INJECTION SOLUTION: 28 days supply | Qty: 8 | Fill #1 | Status: AC

## 2020-01-01 ENCOUNTER — Ambulatory Visit: Admit: 2020-01-01 | Discharge: 2020-01-02 | Discharge status: Against Medical Advice | Payer: MEDICARE

## 2020-01-01 LAB — URINALYSIS WITH CULTURE REFLEX
BLOOD UA: NEGATIVE
GLUCOSE UA: NEGATIVE
KETONES UA: NEGATIVE
NITRITE UA: NEGATIVE
PH UA: 7 (ref 5.0–9.0)
PROTEIN UA: NEGATIVE
RBC UA: 0 /HPF (ref <3)
SPECIFIC GRAVITY UA: 1.01 (ref 1.005–1.040)
SQUAMOUS EPITHELIAL: 3 /HPF (ref 0–5)
WBC UA: 3 /HPF — ABNORMAL HIGH (ref <2)

## 2020-01-01 LAB — BLOOD UA: Hemoglobin:PrThr:Pt:Urine:Ord:Test strip: NEGATIVE

## 2020-01-02 NOTE — Unmapped (Signed)
Contains abnormal data Urinalysis with Culture Reflex  Order: [id number]  Status:  Final result ????    Sent to provider for review and action

## 2020-01-02 NOTE — Unmapped (Signed)
arriv amb to triage; states new swelling to abdomen, legs and feet  X 2 weeks; presents today b/c not going away.

## 2020-01-07 ENCOUNTER — Ambulatory Visit: Admit: 2020-01-07 | Discharge: 2020-01-07 | Discharge status: Against Medical Advice | Payer: MEDICARE

## 2020-01-08 NOTE — Unmapped (Signed)
He's having some side effects that he wants to speak with the doctor about 1st. Rescheduling refill call for 6/8

## 2020-01-09 ENCOUNTER — Ambulatory Visit
Admit: 2020-01-09 | Discharge: 2020-01-18 | Disposition: A | Discharge status: Home Health | Payer: MEDICARE | Admitting: Internal Medicine

## 2020-01-09 ENCOUNTER — Encounter
Admit: 2020-01-09 | Discharge: 2020-01-18 | Disposition: A | Discharge status: Home Health | Payer: MEDICARE | Attending: Anesthesiology | Admitting: Internal Medicine

## 2020-01-09 LAB — CBC W/ AUTO DIFF
BASOPHILS ABSOLUTE COUNT: 0.1 10*9/L (ref 0.0–0.1)
BASOPHILS RELATIVE PERCENT: 0.6 %
EOSINOPHILS ABSOLUTE COUNT: 0.1 10*9/L (ref 0.0–0.7)
HEMOGLOBIN: 6 g/dL — ABNORMAL LOW (ref 13.5–17.5)
LYMPHOCYTES ABSOLUTE COUNT: 4.2 10*9/L — ABNORMAL HIGH (ref 0.7–4.0)
LYMPHOCYTES RELATIVE PERCENT: 42.8 %
MEAN CORPUSCULAR HEMOGLOBIN: 38.1 pg — ABNORMAL HIGH (ref 26.0–34.0)
MEAN CORPUSCULAR VOLUME: 103 fL — ABNORMAL HIGH (ref 81.0–95.0)
MEAN PLATELET VOLUME: 9.4 fL (ref 7.0–10.0)
MONOCYTES ABSOLUTE COUNT: 2.9 10*9/L — ABNORMAL HIGH (ref 0.1–1.0)
MONOCYTES RELATIVE PERCENT: 29.9 %
NEUTROPHILS ABSOLUTE COUNT: 2.5 10*9/L (ref 1.7–7.7)
NEUTROPHILS RELATIVE PERCENT: 25.8 %
NUCLEATED RED BLOOD CELLS: 4 /100{WBCs} (ref <=4)
PLATELET COUNT: 111 10*9/L — ABNORMAL LOW (ref 150–450)
RED BLOOD CELL COUNT: 1.58 10*12/L — ABNORMAL LOW (ref 4.32–5.72)
RED CELL DISTRIBUTION WIDTH: 24 % — ABNORMAL HIGH (ref 12.0–15.0)
WBC ADJUSTED: 9.7 10*9/L (ref 3.5–10.5)

## 2020-01-09 LAB — COMPREHENSIVE METABOLIC PANEL
ALBUMIN: 2.9 g/dL — ABNORMAL LOW (ref 3.4–5.0)
ALKALINE PHOSPHATASE: 285 U/L — ABNORMAL HIGH (ref 46–116)
ALT (SGPT): 24 U/L (ref 10–49)
ANION GAP: 8 mmol/L (ref 3–11)
AST (SGOT): 103 U/L — ABNORMAL HIGH (ref <34)
BILIRUBIN TOTAL: 10.4 mg/dL — ABNORMAL HIGH (ref 0.3–1.2)
BLOOD UREA NITROGEN: 18 mg/dL (ref 9–23)
BUN / CREAT RATIO: 23
CALCIUM: 8.7 mg/dL (ref 8.7–10.4)
CHLORIDE: 109 mmol/L — ABNORMAL HIGH (ref 98–107)
CO2: 19.9 mmol/L — ABNORMAL LOW (ref 20.0–31.0)
CREATININE: 0.8 mg/dL (ref 0.60–1.10)
EGFR CKD-EPI AA MALE: >90 mL/min/{1.73_m2}
EGFR CKD-EPI NON-AA MALE: >90 mL/min/{1.73_m2}
GLUCOSE RANDOM: 110 mg/dL (ref 70–179)
PROTEIN TOTAL: 7.9 g/dL (ref 5.7–8.2)
SODIUM: 137 mmol/L (ref 135–145)

## 2020-01-09 LAB — URINALYSIS WITH CULTURE REFLEX
GLUCOSE UA: NEGATIVE
KETONES UA: NEGATIVE
NITRITE UA: NEGATIVE
PH UA: 6 (ref 5.0–9.0)
PROTEIN UA: NEGATIVE
RBC UA: <1 /HPF (ref <3)
SPECIFIC GRAVITY UA: 1.015 (ref 1.005–1.040)
UROBILINOGEN UA: 4 — AB
WBC UA: 3 /HPF — ABNORMAL HIGH (ref <2)

## 2020-01-09 LAB — SMEAR REVIEW

## 2020-01-09 LAB — MONOCYTES RELATIVE PERCENT: Monocytes/100 leukocytes:NFr:Pt:Bld:Qn:Automated count: 29.9

## 2020-01-09 LAB — ETHANOL
ETHANOL: <10 mg/dL (ref <=10.0)
Ethanol:MCnc:Pt:Ser/Plas:Qn:GC: <10

## 2020-01-09 LAB — EGFR CKD-EPI AA MALE: Glomerular filtration rate/1.73 sq M.predicted.black:ArVRat:Pt:Ser/Plas/Bld:Qn:Creatinine-based formula (CKD-EPI): >90

## 2020-01-09 LAB — BILIRUBIN DIRECT: Bilirubin.glucuronidated+Bilirubin.albumin bound:MCnc:Pt:Ser/Plas:Qn:: 6.6 — ABNORMAL HIGH

## 2020-01-09 LAB — SLIDE REVIEW

## 2020-01-09 LAB — RBC UA: Erythrocytes:Naric:Pt:Urine sed:Qn:Microscopy.light.HPF: <1

## 2020-01-09 LAB — LACTATE BLOOD VENOUS
Lactate:SCnc:Pt:BldV:Qn:: 1.2
Lactate:SCnc:Pt:BldV:Qn:: 1.5

## 2020-01-09 LAB — LACTATE DEHYDROGENASE: Lactate dehydrogenase:CCnc:Pt:Ser/Plas:Qn:: 1183 — ABNORMAL HIGH

## 2020-01-09 LAB — LIPASE: Triacylglycerol lipase:CCnc:Pt:Ser/Plas:Qn:: 69 — ABNORMAL HIGH

## 2020-01-09 NOTE — Unmapped (Signed)
[facility] Emergency Department Provider Note      ED Clinical Impression     Final diagnoses:   Non-intractable vomiting with nausea, unspecified vomiting type (Primary)   Generalized abdominal pain   Hyperbilirubinemia   Jaundice       Initial Impression, Assessment and Plan, and ED Course     49 y.o. male with a PMH of sickle cell anemia (EPO injections twice weekly), seizures, hyperbilirubinemia and pulmonary hypertension presents for evaluation of six episodes of non-bloody, non-bilious emesis today, aching LUQ pain, and abdominal distension. Of note, patient also reports 3-4 weeks of bilateral lower extremity swelling. Pt states also has liver problems. See full HPI below.    VSS on arrival, pt afebrile. Pt lethargic appearing on exam with slight slurring of speech, in NAD, non-toxic. Scleral icterus is noted. Cardiopulmonary exam notable for systolic murmur. Abdomen is soft, slightly distended with TTP noted to the L mid abdomen and to the epigastric area. Negative Murphy's sign. No rebound or guarding or peritoneal signs. Hypoactive bowel sounds but BS are present. No CVA TTP. Pt with 1+ pitting edema to bilateral ankles. No calf swelling, LE TTP or erythema or warmth.     Differential for n/v includes possible medication side effects - pt currently on voxelotor and phenytoin, possible biliary etiology given associated upper abd pain and known history of gallstones however pt without positive Murphy's sign and n/v seems to be his predominant symptom vs hepatic etiology such as hepatitis or other chronic liver disease or new onset cirrhosis however pt does not consume EtOH. Pt does have slowly uptrending of AST, alk phos, and total bilirubin since last year, unsure of exact cause of this - could be 2/2 SCD or possibly medication related - has been followed by hematology clinic. Possibly EtOH hepatitis although pt denies EtOH use however does have slight slurring of speech. Possible hepatic crisis given known SCD. Possibly pancreatitis although pt has no hx of this, denies any EtOH use. Lower concern for SBO - pt did have bowel movement today. Lower concern for mesenteric ischemia. Unsure of exact cause of LE edema, could be 2/2 liver dysfunction. Low concern for PE or CHF exacerbation - pt with normal EF on last echo, could possibly be d/t new onset cirrhosis.     Plan for EKG, UA, and basic labs including lipase. Will give fluids and Zofran.    14:48  HGB 6, HCT 16.3, T-bili 10.4, AST 103, and ALK 285. Plan for [place], reticulocytes, direct bilirubin, and LDH.     3:13 PM  Pt reassessed, states nausea is slightly improved, has not vomited again but states nausea continues - will dose phenergan. Will obtain RUQ and gallbladder US although pt without Murphey's sign but this can visualize liver, plan to discuss case with benign hematology .     4:04 PM  LDH is up at 1183 which is down from prior study of 3129 in February.  Direct bilirubin is elevated at 6.60, this is increased from 4.00 in February.  Lactic acid and urinalysis are pending. Reticulocytes are pending.    US Abdomen US Impression:  Findings are indeterminate for cholecystitis. Gallstones are present. There is gallbladder wall thickening and small volume pericholecystic fluid which is nonspecific in the setting of liver disease and ascites.     Heterogeneous, coarse liver parenchyma suggestive of chronic liver disease.     Suspect less likely obstructive cause given abd pain is minimal and n/v seems to be his primary  symptom.    4:28 PM  Discussed case with Benign Hematology Fellow.  States that patient's current worsening of symptoms and bilirubin level is likely not related to his underlying sickle cell disease.  States LDH is not significantly elevated.  States direct hyperbilirubinemia is not known to be secondary to underlying sickle cell disease.  Concern is from intrahepatic etiology, possibly obstructive etiology although alkaline phosphatase is downtrending, possibly new onset cirrhosis, possibly hepatitis.  I do think patient would benefit from inpatient admission for further work-up of worsening liver disease.  Benign hematology fellow recommends medicine admission and they can consult with benign hematology if needed however there is no further intervention required from their standpoint.  Will page MAO.    17:04   Spoke with Hospitalist at [facility] who is going to put in admission order for main campus.     6:13 PM  Pt remains stable. Pt's mother: Germany Chelf, [phone].    History     Chief Complaint  Emesis      HPI   Patient was seen by me at 1:19 PM.    Patient is a 49 y.o. male with a PMH of sickle cell anemia, seizures, hyperbilirubinemia, and pulmonary hypertension who presents to the ED for nausea and vomiting with generalized abdominal pain. Patient reports three episodes of non-bloody, non-bilious emesis over the last two hours. Since arrival to ED he has thrown up 3 more times. Denies hematemesis or coffee ground emesis. He also reports aching upper abdominal pain and abdominal distention. He also reports onset of bilateral lower extremity swelling that onset about 3-4 weeks ago. He reports that Bernestine Amass (his PCP) sent him here 3-4 days ago for liver problems but doesn't know what it is from. He denies hx of abdominal surgeries. Patient reports decreased appetite. He denies alcohol use. He notes that it has been harder to have a bowel movement lately. Last BM was this morning. He denies chest pain, shortness of breath, fevers, chills, diarrhea, and urinary symptoms. Patient receives EPO injections twice weekly. He denies any new medications.  Denies extremity pain.     Per chart review, patient was seen at [facility] in 06/2019 and had AST 121, ALK 331., T-bilirubin of 5.7. CT at this time showed gall stones without cholecystitis. Last CMP on 09/26/19 showed AST 133, ALK 341, and T-bili 7.1. In February 2021, note from hematology states that patient is being monitored for bilirubenemia in the setting of taking Volexator medication.      Previous chart, nursing notes, and vital signs reviewed.      Pertinent labs & imaging results that were available during my care of the patient were reviewed by me and considered in my medical decision making (see chart for details).    Past Medical History:   Diagnosis Date   ??? Anemia    ??? Foot pain    ??? 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 Hypertension    ??? Pulmonary hypertension (CMS-HCC)    ??? Seizures (CMS-HCC)    ??? Sickle cell anemia (CMS-HCC)    ??? Vitamin D deficiency        Past Surgical History:   Procedure Laterality Date   ??? PR RIGHT HEART CATH O2 SATURATION & CARDIAC OUTPUT N/A 07/18/2017    Procedure: Right Heart Catheterization;  Surgeon: Zannie Cove, MD;  Location: [facility] CATH;  Service: Cardiology       No current facility-administered medications for this encounter.  Current Outpatient Medications:   ???  acetaminophen (TYLENOL 8 HOUR) 650 MG CR tablet, Take 650 mg by mouth every eight (8) hours as needed for pain., Disp: , Rfl:   ???  carBAMazepine (TEGRETOL  XR) 400 MG 12 hr tablet, Take 2 tablets (800 mg total) by mouth Two (2) times a day., Disp: 120 tablet, Rfl: 11  ???  cholecalciferol, vitamin D3, (VITAMIN D3) 10 mcg (400 unit) cap, Take 1 capsule (400 Units total) by mouth daily., Disp: 30 each, Rfl: 0  ???  diclofenac sodium (VOLTAREN) 1 % gel, Apply 2 g topically daily as needed for arthritis., Disp: 100 g, Rfl: prn  ???  empty container (SHARPS CONTAINER) Misc, use as directed, Disp: 1 each, Rfl: 2  ???  epoetin alfa (PROCRIT) 40,000 unit/mL injection, Inject 1 mL (40,000 Units total) under the skin Two (2) times a week., Disp: 8 mL, Rfl: 3  ???  folic acid (FOLVITE) 1 MG tablet, Take 1 tablet (1 mg total) by mouth daily., Disp: 30 tablet, Rfl: 3  ???  hydroxyurea (HYDREA) 500 mg capsule, Take 1 capsule (500 mg total) by mouth daily., Disp: 30 capsule, Rfl: 0  ??? lidocaine (LIDODERM) 5 % patch, Place 1 patch on the skin., Disp: , Rfl:   ???  naproxen (NAPROSYN) 375 MG tablet, TAKE 1 TABLET (375 MG TOTAL) BY MOUTH 2 (TWO) TIMES DAILY WITH A MEAL., Disp: , Rfl:   ???  nystatin-triamcinolone (MYCOLOG) 100,000-0.1 unit/gram-% ointment, Apply topically Two (2) times a day., Disp: 30 g, Rfl: 0  ???  oxyCODONE (ROXICODONE) 5 MG immediate release tablet, Take 1 tablet (5 mg total) by mouth every eight (8) hours as needed for pain for up to 10 doses. (Patient not taking: Reported on 09/04/2019), Disp: 10 tablet, Rfl: 0  ???  OXYGEN-AIR DELIVERY SYSTEMS MISC, 2 L/min by Each Nare route nightly. [employer], Disp: , Rfl:   ???  phenytoin (DILANTIN) 100 MG ER capsule, Take 1 in the morning and 2 in the evening, Disp: 90 capsule, Rfl: 5  ???  promethazine (PHENERGAN) 12.5 MG tablet, Take 12.5 mg by mouth every six (6) hours as needed for nausea., Disp: , Rfl:   ???  syringe with needle (BD TUBERCULIN SYRINGE) 1 mL 27 x 1/2 Syrg, Use as directed twice weekly, Disp: 24 each, Rfl: 3  ???  voxelotor (OXBRYTA) 500 mg tablet, Take 1 tablet (500 mg total) by mouth daily. (Patient not taking: Reported on 10/10/2019), Disp: 30 tablet, Rfl: 11    Allergies  Patient has no known allergies.    Family History   Problem Relation Age of Onset   ??? Hypertension Mother    ??? Hypertension Father    ??? No Known Problems Sister    ??? No Known Problems Brother    ??? No Known Problems Brother    ??? No Known Problems Brother    ??? No Known Problems Brother    ??? Sickle cell anemia Sister    ??? No Known Problems Sister    ??? Clotting disorder Paternal Grandmother    ??? No Known Problems Maternal Aunt    ??? No Known Problems Maternal Uncle    ??? No Known Problems Paternal Aunt    ??? No Known Problems Paternal Uncle    ??? No Known Problems Maternal Grandmother    ??? No Known Problems Maternal Grandfather    ??? No Known Problems Paternal Grandfather    ??? No Known Problems Other    ???  Seizures Neg Hx    ??? Pulmonary Hypertension Neg Hx    ??? Autoimmune disease Neg Hx    ??? Venous thrombosis Neg Hx    ??? Amblyopia Neg Hx    ??? Blindness Neg Hx    ??? Cancer Neg Hx    ??? Cataracts Neg Hx    ??? Diabetes Neg Hx    ??? Glaucoma Neg Hx    ??? Macular degeneration Neg Hx    ??? Retinal detachment Neg Hx    ??? Strabismus Neg Hx    ??? Stroke Neg Hx    ??? Thyroid disease Neg Hx        Social History  Social History     Tobacco Use   ??? Smoking status: Current Some Day Smoker     Packs/day: 1.00     Years: 0.50     Pack years: 0.50     Types: Cigars   ??? Smokeless tobacco: Never Used   ??? Tobacco comment: Pt smokes 1-2 cigars daily, Pt intends to cease tobacco use    Vaping Use   ??? Vaping Use: Never used   Substance Use Topics   ??? Alcohol use: Not Currently     Alcohol/week: 0.0 standard drinks     Comment: occasional   ??? Drug use: No       Review of Systems    Review of Systems   Constitutional: Negative for chills and fever.   Respiratory: Negative for shortness of breath.    Cardiovascular: Negative for chest pain.   Gastrointestinal: Positive for abdominal distention, abdominal pain, nausea and vomiting. Negative for blood in stool, constipation and diarrhea.   Genitourinary: Negative for decreased urine volume, difficulty urinating, dysuria, hematuria and urgency.   Musculoskeletal: Negative for arthralgias and myalgias.   All other systems reviewed and are negative.      Labs     Labs Reviewed   COMPREHENSIVE METABOLIC PANEL - Abnormal; Notable for the following components:       Result Value    Chloride 109 (*)     CO2 19.9 (*)     Albumin 2.9 (*)     Total Bilirubin 10.4 (*)     AST 103 (*)     Alkaline Phosphatase 285 (*)     All other components within normal limits   LIPASE - Abnormal; Notable for the following components:    Lipase 69 (*)     All other components within normal limits   URINALYSIS WITH CULTURE REFLEX - Abnormal; Notable for the following components:    Urobilinogen, UA 4.0 mg/dL (*)     Bilirubin, UA Moderate (*)     Blood, UA Trace (*)     WBC, UA 3 (*) Bacteria, UA Rare (*)     All other components within normal limits   BILIRUBIN, DIRECT - Abnormal; Notable for the following components:    Bilirubin, Direct 6.60 (*)     All other components within normal limits   LACTATE DEHYDROGENASE - Abnormal; Notable for the following components:    LDH 1,183 (*)     All other components within normal limits   CBC W/ AUTO DIFF - Abnormal; Notable for the following components:    RBC 1.58 (*)     HGB 6.0 (*)     HCT 16.3 (*)     MCV 103.0 (*)     MCH 38.1 (*)     MCHC 37.0 (*)     RDW  24.0 (*)     Platelet 111 (*)     Absolute Lymphocytes 4.2 (*)     Absolute Monocytes 2.9 (*)     Anisocytosis Marked (*)     All other components within normal limits   SLIDE REVIEW - Abnormal; Notable for the following components:    Smear Review Comments See Comment (*)     Polychromasia Moderate (*)     Target Cells Moderate (*)     Sickle Cells Present (*)     Howell-Jolly Bodies Present (*)     Poikilocytosis Moderate (*)     All other components within normal limits   ETHANOL - Normal    Narrative:     Testing for medical purposes only.   LACTATE, VENOUS, WHOLE BLOOD - Normal   URINE CULTURE   CBC W/ DIFFERENTIAL    Narrative:     The following orders were created for panel order CBC w/ Differential.  Procedure                               Abnormality         Status                     ---------                               -----------         ------                     CBC w/ [id number]]         Abnormal            Final result               Morphology Review[[id number]]           Abnormal            Final result                 Please view results for these tests on the individual orders.   EXTRA TUBES    Narrative:     The following orders were created for panel order ED Extra Tubes.  Procedure                               Abnormality         Status                     ---------                               -----------         ------                     GREEN LITHIUM HEPARIN E.Marland KitchenMarland Kitchen[[id number]]                                                 LAVENDER EDTA EXTRA OZHY[[id number]]  Final result               LIGHT BLUE CITRATE EXTR.Marland KitchenMarland Kitchen[[id number]]                      Final result               PINK EXTRA [id number]]                                 In process                 ROYAL BLUE EXTRA [id number]                           Final result               ORANGE SST EXTRA [id number]                           Final result               GOLD SST EXTRA UJWJ[[id number]]                             Final result               GOLD SST EXTRA OZHY[[id number]]                             Final result                 Please view results for these tests on the individual orders.   RETICULOCYTES   LAVENDER EDTA EXTRA TUBE   LIGHT BLUE CITRATE EXTRA TUBE   ROYAL BLUE EXTRA TUBE   ORANGE SST EXTRA TUBE   GOLD SST EXTRA TUBE   GOLD SST EXTRA TUBE   PINK EXTRA TUBE       Radiology     ECG 12 Lead    Result Date: 01/09/2020  NORMAL SINUS RHYTHM NONSPECIFIC T WAVE FLATTENING WHEN COMPARED WITH ECG OF 09-Aug-2019 19:51, T WAVE AMPLITUDE HAS DECREASED Confirmed by Lorretta Harp [phone]) on 01/09/2020 4:37:39 PM    US Abdomen Limited    Result Date: 01/09/2020  EXAM: US ABDOMEN LIMITED DATE: 01/09/2020 3:39 PM ACCESSION: [id number] UN DICTATED: 01/09/2020 3:39 PM INTERPRETATION LOCATION: [facility] CLINICAL INDICATION: 49 years old Male with upper abdominal pain, nausea, hx cholelithiasis  TECHNIQUE: Static and cine images of the right upper quadrant were performed. COMPARISON: Liver Doppler ultrasound dated 09/23/2018. FINDINGS: LIVER: The liver is heterogeneous with coarsened echotexture and mildly nodular contour. No focal hepatic lesions. No biliary ductal dilatation. GALLBLADDER: The gallbladder is distended and contains internal stones. Sonographic Eulah Pont sign is unable to be assessed. Small volume pericholecystic fluid is nonspecific in the setting of ascites. The gallbladder wall is mildly thickened. LIMITED RIGHT KIDNEY: No hydronephrosis. Subcentimeter right renal cyst. OTHER: Small-volume ascites in the right upper quadrant.     Findings are indeterminate for cholecystitis. Gallstones are present. There is gallbladder wall thickening and small volume pericholecystic fluid which is nonspecific in the setting of liver disease and ascites. Heterogeneous, coarse liver parenchyma suggestive of chronic liver disease. Please see below for data measurements: Liver: 18.8 cm Gallbladder wall: 0.30 cm Sonographic Murphy's Sign: patient medicated Pericholecystic  fluid visualized: no Common hepatic duct: 0.45 cm Proximal common bile duct: 0.48 cm Distal common bile duct: 0.56 cm Right kidney: 9.76 cm       Physical Exam     VITAL SIGNS:    Vitals:    01/09/20 1226 01/09/20 1703   BP: 132/65 122/60   Pulse: 66 86   Resp: 14 16   Temp: 36.6 ??C (97.9 ??F) 36.6 ??C (97.9 ??F)   TempSrc:  Oral   SpO2: 95% 96%       Physical Exam  Vitals reviewed.   Constitutional:       General: He is not in acute distress.     Appearance: Normal appearance. He is well-developed. He is not ill-appearing, toxic-appearing or diaphoretic.      Comments:  Pt lethargic appearing on exam with slight slurring of speech, in NAD, non-toxic. Scleral icterus is noted.    HENT:      Head: Normocephalic and atraumatic.   Eyes:      General: Scleral icterus present.   Cardiovascular:      Rate and Rhythm: Normal rate and regular rhythm.      Heart sounds: Murmur heard.   No friction rub. No gallop.    Pulmonary:      Effort: Pulmonary effort is normal. No respiratory distress.      Breath sounds: Normal breath sounds. No stridor. No wheezing, rhonchi or rales.   Abdominal:      General: There is distension.      Palpations: Abdomen is soft. There is no mass.      Tenderness: There is abdominal tenderness in the epigastric area and left upper quadrant. There is no guarding or rebound.      Hernia: No hernia is present. Comments: Abdomen is soft, slightly distended with TTP noted to the L mid abdomen and to the epigastric area. Negative Murphy's sign. No rebound or guarding or peritoneal signs. Hypoactive bowel sounds but BS are present. No CVA TTP.   Musculoskeletal:         General: Normal range of motion.      Cervical back: Normal range of motion.      Right lower leg: Edema present.      Left lower leg: Edema present.      Comments: Pt with 1+ pitting edema to bilateral ankles. No calf swelling, LE TTP or erythema or warmth.    Skin:     General: Skin is warm and dry.   Neurological:      Mental Status: He is alert and oriented to person, place, and time.   Psychiatric:         Behavior: Behavior normal.         Documentation assistance was provided by Andrey Farmer, Scribe on Jan 09, 2020 at 1:19 PM for Bluford Kaufmann, [place]    Documentation assistance was provided by the scribe in my presence.  The documentation recorded by the scribe has been reviewed by me and accurately reflects the services I personally performed.       Toni Arthurs [place], [place]  01/10/20 1208

## 2020-01-09 NOTE — Unmapped (Signed)
Pt to triage and reports 3 episodes of emesis in the past 2 hours. Pt also states he has liver problems. pt appears lethargic in triage

## 2020-01-10 LAB — PHENYTOIN LEVEL: Phenytoin:MCnc:Pt:Ser/Plas:Qn:: 6.6 — ABNORMAL LOW

## 2020-01-10 LAB — MANUAL DIFFERENTIAL
BASOPHILS - REL (DIFF): 3 %
EOSINOPHILS - ABS (DIFF): 0.3 10*9/L (ref 0.0–0.4)
EOSINOPHILS - REL (DIFF): 4 %
LYMPHOCYTES - ABS (DIFF): 2.2 10*9/L (ref 1.5–5.0)
LYMPHOCYTES - REL (DIFF): 31 %
MONOCYTES - ABS (DIFF): 1.1 10*9/L — ABNORMAL HIGH (ref 0.2–0.8)
MONOCYTES - REL (DIFF): 16 %
NEUTROPHILS - ABS (DIFF): 3.2 10*9/L (ref 2.0–7.5)
NEUTROPHILS - REL (DIFF): 46 %

## 2020-01-10 LAB — CARBAMAZEPINE LEVEL: Carbamazepine:MCnc:Pt:Ser/Plas:Qn:: 5.9

## 2020-01-10 LAB — CBC W/ AUTO DIFF
HEMATOCRIT: 17.6 % — ABNORMAL LOW (ref 41.0–53.0)
HEMOGLOBIN: 5.8 g/dL — ABNORMAL LOW (ref 13.5–17.5)
MEAN CORPUSCULAR HEMOGLOBIN CONC: 32.8 g/dL (ref 31.0–37.0)
MEAN CORPUSCULAR HEMOGLOBIN: 36.9 pg — ABNORMAL HIGH (ref 26.0–34.0)
MEAN CORPUSCULAR VOLUME: 112.6 fL — ABNORMAL HIGH (ref 80.0–100.0)
MEAN PLATELET VOLUME: 11 fL — ABNORMAL HIGH (ref 7.0–10.0)
PLATELET COUNT: 123 10*9/L — ABNORMAL LOW (ref 150–440)
RED BLOOD CELL COUNT: 1.57 10*12/L — ABNORMAL LOW (ref 4.50–5.90)
RED CELL DISTRIBUTION WIDTH: 22.5 % — ABNORMAL HIGH (ref 12.0–15.0)
WBC ADJUSTED: 7 10*9/L (ref 4.5–11.0)

## 2020-01-10 LAB — MACROCYTES

## 2020-01-10 LAB — COMPREHENSIVE METABOLIC PANEL
ALBUMIN: 3.1 g/dL — ABNORMAL LOW (ref 3.5–5.0)
ALKALINE PHOSPHATASE: 268 U/L — ABNORMAL HIGH (ref 38–126)
ANION GAP: 4 mmol/L — ABNORMAL LOW (ref 7–15)
AST (SGOT): 95 U/L — ABNORMAL HIGH (ref 19–55)
BILIRUBIN TOTAL: 9.2 mg/dL — ABNORMAL HIGH (ref 0.0–1.2)
BLOOD UREA NITROGEN: 15 mg/dL (ref 7–21)
BUN / CREAT RATIO: 19
CALCIUM: 8.3 mg/dL — ABNORMAL LOW (ref 8.5–10.2)
CHLORIDE: 112 mmol/L — ABNORMAL HIGH (ref 98–107)
CO2: 23 mmol/L (ref 22.0–30.0)
CREATININE: 0.8 mg/dL (ref 0.70–1.30)
EGFR CKD-EPI AA MALE: >90 mL/min/{1.73_m2} (ref >=60)
EGFR CKD-EPI NON-AA MALE: >90 mL/min/{1.73_m2} (ref >=60)
GLUCOSE RANDOM: 70 mg/dL (ref 70–179)
POTASSIUM: 4.6 mmol/L (ref 3.5–5.0)
PROTEIN TOTAL: 7.7 g/dL (ref 6.5–8.3)
SODIUM: 139 mmol/L (ref 135–145)

## 2020-01-10 LAB — INR: Coagulation tissue factor induced.INR:RelTime:Pt:PPP:Qn:Coag: 1.68

## 2020-01-10 LAB — FERRITIN: Ferritin:MCnc:Pt:Ser/Plas:Qn:: 173

## 2020-01-10 LAB — PRO-BNP: Natriuretic peptide.B prohormone N-Terminal:MCnc:Pt:Ser/Plas:Qn:: 795 — ABNORMAL HIGH

## 2020-01-10 LAB — ALBUMIN: Albumin:MCnc:Pt:Ser/Plas:Qn:: 3.1 — ABNORMAL LOW

## 2020-01-10 LAB — ACETAMINOPHEN LEVEL: Acetaminophen:MCnc:Pt:Ser/Plas:Qn:: 0

## 2020-01-10 LAB — PROTIME-INR
INR: 1.68
PROTIME: 19.5 s — ABNORMAL HIGH (ref 10.5–13.5)

## 2020-01-10 LAB — RETIC COUNT, MANUAL: Lab: 6.6 — ABNORMAL HIGH

## 2020-01-10 LAB — POIKILOCYTES

## 2020-01-10 LAB — IRON PANEL: IRON: 147 ug/dL (ref 65–175)

## 2020-01-10 LAB — TRANSFERRIN: Transferrin:MCnc:Pt:Ser/Plas:Qn:: 126 — ABNORMAL LOW

## 2020-01-10 LAB — GAMMA GLUTAMYL TRANSFERASE: Gamma glutamyl transferase:CCnc:Pt:Ser/Plas:Qn:: 357 — ABNORMAL HIGH

## 2020-01-10 NOTE — Unmapped (Signed)
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 *      [name] [name] is a 49 y.o. male with h/o SS, MCI, Pulm HTN, seizure d/o, multiple red cell Ab and h/o transfusions who presents to [facility] with LE edema, N/V and Hyperbilirubinemia.    **Elevated LFTs: Gradually worsening problem. T Bili is the highest it has been in Epic but slowly climbing for years. Diff is broad. Pt noted to have increased echogenicity and nodular pattern on RUQ u/s. Consider: Portal clot, CHF/worsening Pulm HTN, Fe overload, HIV (checed 2018 neg), Hepatitis (neg for A/B/C 08/2018), Med effect, sickle cell hepatopathy, chronic intrahepatic cholestasis. ED stated spoke with heme about pt who felt SS was not necessarily to blame for current findings and further w/u was warranted  -Consider liver with doppler or MRI, had liver doppler 09/2018 with nonspecific findings but no clot  -ECHO ordered  -pro BNP pending  -Consider Hepatology c/s  -Check ferritin and saturation  -HIV, Consider recheck Hep Panel  -Phenytoin/Tegretol levels  -Consider further imaging MRI MRCP  -Consider heme c/s  -Acetaminophen level      LE Edema: Cause unclear, worsening Pulm HTN or liver failure or hepatic clot could be considered. Pt has low albumin thus poor oncotic pressures. Most recent ECHO 11/2018 with e/o Pulm HTN but good BiV systolic function. Workup largely as above as may not be mutually exclusive, anticipate multifactorial etiology. Some mention of nonspecific ascites on RUQ u/s.  -ECHO  -ProBNP      **Acute n/v abd pain: Seems self limited and resolved. Lipase was not remarkable. Pt now tolerating po, advancing slowly. Could have been due to meds both voxelotor and phenytoin can be implicated. Pt may have a component of constipation that is contributing as well. Less likely but possible viral illness or food poisoning (pt states didn't eat much prior but couldn't specify what he ate).  -KUB eval stool burden  -Miralax      **Constipation/decreased urine output: Based on description does not seem to be neurologically mediated, however, would have low threshold to r/o CNS etiology  -Miralax  -PVR  -u/a      **Acidosis, NAGMA: Lactate WNL. Per nephrology from Jan question of RTA with intermittent HyperK.   -VBG      **Thrombocytopenia: Possibly 2/2 liver disease or med induced. Seems to be ongoing since 07/2019      **SS disease: No active crisis. Gets Epo and recently changed from HU to Voxelotor  -Continue Voxelotor      **FEN: Diet as tolerated  **PPx: Lovenox    Code Status:  Full Code     Floor time 90 minutes, > 50% spent in counseling and coordination of care about the following issues:  plan of care, transfer needs, management options, test results, , discussions with patient, ED physician, RN.  ___________________________________________________________________    Chief Complaint  Chief Complaint   Patient presents with   ??? Emesis       HPI:  [name] [name] is a 49  y.o. male with h/o SS, MCI, Pulm HTN, seizure d/o, multiple red cell ab and h/o transfusion who presents to [facility] with LE edema, N/V and Hyperbilirubinemia.    Pt reports he had intended to come to [facility] today for evaluation of LE edema. He states that at 11 when he was preparing to go he had sudden onset N/V and lower abdominal pain with 5-6 episodes of NBNB emesis. He states that he had been feeling fine prior to this and did not have abd pn/n/v prior to today. He states upon arrival he was provided antiemetics and after that he has felt better, from this standpoint. He is able to tolerate crackers and liquids presently. No further vomiting. He states he has a hard time getting stool to pass, as well as urine. When further questioned it seems he has hard stool and is expressing some feelings of constipation. In regards to urine, it seems he is describing poor liquid intake and little urine output rather than incomplete voiding or straining.     Pt states he has had LE edema for several months but has not been able to get evaluated due to his strong dislike of having to wait for care in the [place] of the ED. He states it is pitting in nature noted mostly below the knee. Denies pain. Denies orthopnea. No recent cp. Only med change has been to [employer]. Denies SOB. States he has discussed this with his PMD and had a direct admit arranged at one point in time but has never completed the studies needed for workup. Does not think he has tried diuretics for this.    Denies any symptoms c/w SS crisis at this time.    In ED pt received: 2L NS, Phenergan 12.5 IV, Zofran 4mg  IV    Allergies:  Patient has no known allergies.     Medications:   Prior to Admission medications    Medication Dose, Route, Frequency   acetaminophen (TYLENOL 8 HOUR) 650 MG CR tablet 650 mg, Oral, Every 8 hours PRN   carBAMazepine (TEGRETOL  XR) 400 MG 12 hr tablet 800 mg, Oral, 2 times a day (standard)   cholecalciferol, vitamin D3, (VITAMIN D3) 10 mcg (400 unit) cap 400 Units, Oral, Daily   diclofenac sodium (VOLTAREN) 1 % gel 2 g, Topical, Daily PRN   empty container (SHARPS CONTAINER) Misc use as directed   epoetin alfa (PROCRIT) 40,000 unit/mL injection 40,000 Units, Subcutaneous, 2 times a week   folic acid (FOLVITE) 1 MG tablet 1 mg, Oral, Daily (standard)   lidocaine (LIDODERM) 5 % patch 1 patch, Transdermal   naproxen (NAPROSYN) 375 MG tablet TAKE 1 TABLET (375 MG TOTAL) BY MOUTH 2 (TWO) TIMES DAILY WITH A MEAL.   oxyCODONE (ROXICODONE) 5 MG immediate release tablet 5 mg, Oral, Every 8 hours PRN  Patient not taking: Reported on 09/04/2019   OXYGEN-AIR DELIVERY SYSTEMS MISC 2 L/min, Each Nare, Nightly, [employer]   phenytoin (DILANTIN) 100 MG ER capsule Take 1 in the morning and 2 in the evening   promethazine (PHENERGAN) 12.5 MG tablet 12.5 mg, Oral, Every 6 hours PRN   syringe with needle (BD TUBERCULIN SYRINGE) 1 mL 27 x 1/2 Syrg Use as directed twice weekly   voxelotor (OXBRYTA) 500 mg tablet 500 mg, Oral, Daily (standard)  Patient not taking: Reported on 10/10/2019       Medical History:  Past Medical History:   Diagnosis Date   ??? Anemia    ??? Foot pain    ???  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 Hypertension    ??? Pulmonary hypertension (CMS-HCC)    ??? Seizures (CMS-HCC)    ??? Sickle cell anemia (CMS-HCC)    ??? Vitamin D deficiency        Surgical History:  Past Surgical History:   Procedure Laterality Date   ??? PR RIGHT HEART CATH O2 SATURATION & CARDIAC OUTPUT N/A 07/18/2017    Procedure: Right Heart Catheterization;  Surgeon: Zannie Cove, MD;  Location: [facility] CATH;  Service: Cardiology       Social History:  Social History     Social History Narrative   ??? Not on file     Social History     Tobacco Use   ??? Smoking status: Current Some Day Smoker     Packs/day: 1.00     Years: 0.50     Pack years: 0.50     Types: Cigars   ??? Smokeless tobacco: Never Used   ??? Tobacco comment: Pt smokes 1-2 cigars daily, Pt intends to cease tobacco use    Vaping Use   ??? Vaping Use: Never used   Substance Use Topics   ??? Alcohol use: Not Currently     Alcohol/week: 0.0 standard drinks     Comment: occasional   ??? Drug use: No       Family History:  Family History   Problem Relation Age of Onset   ??? Hypertension Mother    ??? Hypertension Father    ??? No Known Problems Sister    ??? No Known Problems Brother    ??? No Known Problems Brother    ??? No Known Problems Brother    ??? No Known Problems Brother    ??? Sickle cell anemia Sister    ??? No Known Problems Sister    ??? Clotting disorder Paternal Grandmother    ??? No Known Problems Maternal Aunt    ??? No Known Problems Maternal Uncle    ??? No Known Problems Paternal Aunt    ??? No Known Problems Paternal Uncle    ??? No Known Problems Maternal Grandmother    ??? No Known Problems Maternal Grandfather    ??? No Known Problems Paternal Grandfather    ??? No Known Problems Other    ??? Seizures Neg Hx    ??? Pulmonary Hypertension Neg Hx    ??? Autoimmune disease Neg Hx    ??? Venous thrombosis Neg Hx    ??? Amblyopia Neg Hx    ??? Blindness Neg Hx    ??? Cancer Neg Hx    ??? Cataracts Neg Hx    ??? Diabetes Neg Hx    ??? Glaucoma Neg Hx    ??? Macular degeneration Neg Hx    ??? Retinal detachment Neg Hx    ??? Strabismus Neg Hx    ??? Stroke Neg Hx    ??? Thyroid disease Neg Hx        Review of Systems:  10 systems reviewed and are negative unless otherwise mentioned in HPI      Physical Exam:  Temp:  [36.6 ??C (97.9 ??F)] 36.6 ??C (97.9 ??F)  Heart Rate:  [66-86] 86  Resp:  [14-16] 16  BP: (122-132)/(60-65) 122/60  SpO2:  [95 %-96 %] 96 %  There is no height or weight on file to calculate BMI.    GEN: NAD, lying in bed.Marland Kitchen  EYES: EOMI, Icteric sclerae.  ENT: MMM, no thyromegaly, no carotid bruits  CV:  III/VI systolic murmur heard best at LUSB  PULM: Mild crackles in bases that clear with repeated respiratory cycles  ABD: soft, NT/ND, NABS, no hepatosplenomegaly.  EXT: 2+ pitting edema (posteriorly) to just above the knee.  PSYCH: Very slow to respond, nebulous answers  MSK: No spinal tenderness, No CVA tenderness, no joint effusions or deformities.  NEURO:  Patient is alert and oriented ??3.    Test Results:  Data Review:    All lab results last 24 hours:    Recent Results (from the past 24 hour(s))   Comprehensive Metabolic Panel    Collection Time: 01/09/20 12:49 PM   Result Value Ref Range    Sodium 137 135 - 145 mmol/L    Potassium 4.3 3.5 - 5.1 mmol/L    Chloride 109 (H) 98 - 107 mmol/L    Anion Gap 8 3 - 11 mmol/L    CO2 19.9 (L) 20.0 - 31.0 mmol/L    BUN 18 9 - 23 mg/dL    Creatinine 6.21 3.08 - 1.10 mg/dL    BUN/Creatinine Ratio 23     EGFR CKD-EPI Non-African American, Male >90 mL/min/1.16m2 EGFR CKD-EPI African American, Male >90 mL/min/1.63m2    Glucose 110 70 - 179 mg/dL    Calcium 8.7 8.7 - 65.7 mg/dL    Albumin 2.9 (L) 3.4 - 5.0 g/dL    Total Protein 7.9 5.7 - 8.2 g/dL    Total Bilirubin 84.6 (H) 0.3 - 1.2 mg/dL    AST 962 (H) <95 U/L    ALT 24 10 - 49 U/L    Alkaline Phosphatase 285 (H) 46 - 116 U/L   Lipase Level    Collection Time: 01/09/20 12:49 PM   Result Value Ref Range    Lipase 69 (H) 12 - 53 U/L   CBC w/ Differential    Collection Time: 01/09/20 12:49 PM   Result Value Ref Range    WBC 9.7 3.5 - 10.5 10*9/L    RBC 1.58 (L) 4.32 - 5.72 10*12/L    HGB 6.0 (L) 13.5 - 17.5 g/dL    HCT 28.4 (L) 13.2 - 50.0 %    MCV 103.0 (H) 81.0 - 95.0 fL    MCH 38.1 (H) 26.0 - 34.0 pg    MCHC 37.0 (H) 30.0 - 36.0 g/dL    RDW 44.0 (H) 10.2 - 15.0 %    MPV 9.4 7.0 - 10.0 fL    Platelet 111 (L) 150 - 450 10*9/L    nRBC 4 <=4 /100 WBCs    Neutrophils % 25.8 %    Lymphocytes % 42.8 %    Monocytes % 29.9 %    Eosinophils % 0.9 %    Basophils % 0.6 %    Absolute Neutrophils 2.5 1.7 - 7.7 10*9/L    Absolute Lymphocytes 4.2 (H) 0.7 - 4.0 10*9/L    Absolute Monocytes 2.9 (H) 0.1 - 1.0 10*9/L    Absolute Eosinophils 0.1 0.0 - 0.7 10*9/L    Absolute Basophils 0.1 0.0 - 0.1 10*9/L    Anisocytosis Marked (A) Not Present   Ethanol,Blood    Collection Time: 01/09/20 12:49 PM   Result Value Ref Range    Alcohol, Ethyl <10.0 <=10.0 mg/dL   Morphology Review    Collection Time: 01/09/20 12:49 PM   Result Value Ref Range    Smear Review Comments See Comment (A) Undefined    Polychromasia Moderate (A) Not Present    Target Cells Moderate (A) Not Present  Sickle Cells Present (A) Not Present    Howell-Jolly Bodies Present (A) Not Present    Poikilocytosis Moderate (A) Not Present   Bilirubin, Direct    Collection Time: 01/09/20 12:49 PM   Result Value Ref Range    Bilirubin, Direct 6.60 (H) 0.00 - 0.30 mg/dL   LDH, Lactate dehydrogenase    Collection Time: 01/09/20 12:49 PM   Result Value Ref Range    LDH 1,183 (H) 120 - 246 U/L   ECG 12 Lead    Collection Time: 01/09/20  1:07 PM   Result Value Ref Range    EKG Systolic BP  mmHg    EKG Diastolic BP  mmHg    EKG Ventricular Rate 62 BPM    EKG Atrial Rate 63 BPM    EKG P-R Interval  ms    EKG QRS Duration 104 ms    EKG Q-T Interval 472 ms    EKG QTC Calculation 479 ms    EKG Calculated P Axis  degrees    EKG Calculated R Axis 51 degrees    EKG Calculated T Axis 65 degrees    QTC Fredericia 477 ms   Urinalysis with Culture Reflex    Collection Time: 01/09/20  3:54 PM    Specimen: Clean Catch; Urine   Result Value Ref Range    Color, UA Yellow     Clarity, UA Clear     Specific Gravity, UA 1.015 1.005 - 1.040    pH, UA 6.0 5.0 - 9.0    Leukocyte Esterase, UA Negative Negative    Nitrite, UA Negative Negative    Protein, UA Negative Negative    Glucose, UA Negative Negative    Ketones, UA Negative Negative    Urobilinogen, UA 4.0 mg/dL (A) 0.2 - 2.0 mg/dL    Bilirubin, UA Moderate (A) Negative    Blood, UA Trace (A) Negative    RBC, UA <1 <3 /HPF    WBC, UA 3 (H) <2 /HPF    Squam Epithel, UA 0 0 - 5 /HPF    Bacteria, UA Rare (A) None Seen /HPF   Lactic Acid, Venous, Whole Blood    Collection Time: 01/09/20  3:54 PM   Result Value Ref Range    Lactate, Venous 1.2 0.5 - 1.8 mmol/L   Lactate, Venous, Whole Blood    Collection Time: 01/09/20  7:33 PM   Result Value Ref Range    Lactate, Venous 1.5 0.5 - 1.8 mmol/L       Imaging: Radiology studies were personally reviewed    EKG: EKG was personally reviewed and noted to be and unchanged from previous tracing 07/2019 apart from lower voltage T waves

## 2020-01-10 NOTE — Unmapped (Signed)
Patient A&Ox3, Vss. Patient denied pain, SOB, nausea, vomiting and diarrhea. Patient was admitted this morning from [facility] ED. Patient orient to unit and floor. Patient was free from falls, call bell and bedside table within reach. Will continue to monitor.   Problem: Adult Inpatient Plan of Care  Goal: Plan of Care Review  Outcome: Progressing  Goal: Patient-Specific Goal (Individualization)  Outcome: Progressing  Goal: Absence of Hospital-Acquired Illness or Injury  Outcome: Progressing  Goal: Optimal Comfort and Wellbeing  Outcome: Progressing  Goal: Readiness for Transition of Care  Outcome: Progressing  Goal: Rounds/Family Conference  Outcome: Progressing     Problem: Fall Injury Risk  Goal: Absence of Fall and Fall-Related Injury  Outcome: Progressing     Problem: Electrolyte Imbalance  Goal: Electrolyte Balance  Outcome: Progressing     Problem: Fall Injury Risk  Goal: Absence of Fall and Fall-Related Injury  Outcome: Progressing     Problem: Nausea and Vomiting  Goal: Fluid and Electrolyte Balance  Outcome: Progressing

## 2020-01-10 NOTE — Unmapped (Signed)
Care Management  Initial Transition Planning Assessment    CM met with patient in pt room.  Pt/visitors were wearing hospital provided masks for the duration of the interaction with CM.   CM was wearing hospital provided surgical mask and hospital provided eye protection.  CM was within 6 foot of the patient/visitors during this interaction.      CM met w/ pt in room, pt alert and oriented. Pt lives alone in his trailer. Patient still drives, cannot recall mobile number but states home number is sufficient. Patient reports using a walker on an as needed basis. Pt still drives self.     Will need a ride home at time of discharge. Patient stated that he fell about 2 days ago but refused PT/OT when asked about it. Plan for ECHO and KUB.               General  Care Manager assessed the patient by : In person interview with patient  Orientation Level: Oriented X4  Functional level prior to admission: Independent  Reason for referral: Discharge Planning    Contact/Decision Maker  Extended Emergency Contact Information  Primary Emergency Contact: Pereda Sr,Carolyn/John   [place]  Home Phone: [phone]  Mobile Phone: [phone]  Relation: Mother    Legal Next of Kin / Guardian / POA / Advance Directives       Advance Directive (Medical Treatment)  Does patient have an advance directive covering medical treatment?: Patient does not have advance directive covering medical treatment.  Reason patient does not have an advance directive covering medical treatment:: Patient needs follow-up to complete one.    Health Care Decision Maker [HCDM] (Medical & Mental Health Treatment)  Healthcare Decision Maker: HCDM documented in the HCDM/Contact Info section.  Information offered on HCDM, Medical & Mental Health advance directives:: Patient declined information.    Advance Directive (Mental Health Treatment)  Does patient have an advance directive covering mental health treatment?: Patient does not have advance directive covering mental health treatment.  Reason patient does not have an advance directive covering mental health treatment:: HCDM documented in the HCDM/Contact Info section.    Patient Information  Lives with: Alone    Type of Residence: Private residence        Location/Detail: [address] [place], [place] [address] Per pt is is a trailer w/ several steps to get in    Support Systems/Concerns: Family Members    Responsibilities/Dependents at home?: No    Home Care services in place prior to admission?: No       Equipment Currently Used at Home: walker, standard (pt uses walker on a prn basis)  Current HME Agency (Name/Phone #): pt owns    Currently receiving outpatient dialysis?: No       Financial Information       Need for financial assistance?: No       Social Determinants of Health  Social Determinants of Health     Tobacco Use: High Risk   ??? Smoking Tobacco Use: Current Some Day Smoker   ??? Smokeless Tobacco Use: Never Used   Alcohol Use:    ??? How often do you have 5 or more drinks on one occasion?:    ??? How many drinks containing alcohol do you have on a typical day when you are drinking?:    ??? How often do you have a drink containing alcohol?:    Financial Resource Strain: Low Risk    ??? Difficulty of  Paying Living Expenses: Not very hard   Food Insecurity: No Food Insecurity   ??? Worried About Running Out of Food in the Last Year: Never true   ??? Ran Out of Food in the Last Year: Never true   Transportation Needs: No Transportation Needs   ??? Lack of Transportation (Medical): No   ??? Lack of Transportation (Non-Medical): No   Physical Activity:    ??? Days of Exercise per Week:    ??? Minutes of Exercise per Session:    Stress:    ??? Feeling of Stress :    Social Connections:    ??? Frequency of Communication with Friends and Family:    ??? Frequency of Social Gatherings with Friends and Family:    ??? Attends Religious Services:    ??? Database administrator or Organizations:    ??? Attends Engineer, structural:    ??? Marital Status:    Intimate Programme researcher, broadcasting/film/video Violence:    ??? Fear of Current or Ex-Partner:    ??? Emotionally Abused:    ??? Physically Abused:    ??? Sexually Abused:    Depression:    ??? PHQ-2 Score:    Housing Stability: Low Risk    ??? Within the past 12 months, have you ever stayed: outside, in a car, in a tent, in an overnight shelter, or temporarily in someone else's home (i.e. couch-surfing)?: No   ??? Are you worried about losing your housing?: No   ??? Within the past 12 months, have you been unable to get utilities (heat, electricity) when it was really needed?: No   Substance Use:    ??? Taken prescription drugs for non-medical reasons:    ??? Taken illegal drugs:    ??? Patient indicated they have taken drugs in the past year, including Cannabis, Cocaine, Prescription stimulants, Methamphetamine, Inahalnts, Sedatives or sleeping pills, Hallucinogens, Street Opioids or Prescription opiods for non-medical reasons:    Health Literacy:    ??? :        Discharge Needs Assessment  Concerns to be Addressed: discharge planning, care coordination/care conferences (pt will need a ride home)    Clinical Risk Factors: History of Falls (per pt he fell at home 2 days ago)    Barriers to taking medications: No    Prior overnight hospital stay or ED visit in last 90 days: No    Readmission Within the Last 30 Days: no previous admission in last 30 days         Anticipated Changes Related to Illness: none    Equipment Needed After Discharge: other (see comments) (TBD)    Discharge Facility/Level of Care Needs: other (see comments) (Anticipate dc home w/ self care v PT/OT)    Readmission  Risk of Unplanned Readmission Score:  %  Predictive Model Details   No score data available for [facility] Risk of Unplanned Readmission     Readmitted Within the Last 30 Days? (No if blank)   Patient at risk for readmission?: No    Discharge Plan  Screen findings are: Discharge planning needs identified or anticipated (Comment). (Ride home and PT/OT)    Expected Discharge Date:     Expected Transfer from Critical Care:      Quality data for continuing care services shared with patient and/or representative?: N/A  Patient and/or family were provided with choice of facilities / services that are available and appropriate to meet post hospital care needs?: N/A       Initial Assessment  complete?: Yes

## 2020-01-10 NOTE — Unmapped (Signed)
MRI on hold until screen form is completed in epic. Please answer all the questions in the form. If patient isn't able to answer the questions, please have immediate family member to help answer the questions and list their name and their number in the form. Patients needs to be in a hospital gown, IV should be med locked and medication patches should be removed prior to coming to MRI. Please call MRI at [phone] if you have any question. Thank you!

## 2020-01-10 NOTE — Unmapped (Signed)
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 *         Hyperbilirubinemia - elevated LFTs: long standing history of slowly worsening hyperbilirubinemia with marked increase over the past year. On admission, TBili 10.4, Dbili 6.6, AST 103, ALT 24, AlkPhos 285, GGT 357, mixed cholestatic/hepatic pattern. ProBNP 795, carbamazepine level WNL, phenytoin level 6.6. Abd U/S +gallstones, but indeterminate for cholecystitis and with echogenic, nodular pattern suggestive of chronic liver disease. Etiology currently unclear. Possible sequelae of uncontrolled SS anemia, worsening pulm HTN, medication effect (carbamezapine), intrahepatic cholestasis.   - echo for pulm HTN, MRCP  - f/u HIV, HBV core Ab, consider full hepatitis panel  - f/u ferritin  - consider hepatology c/s after MRCP  - consider neurology c/s regarding seizure meds if no obstructive lesions seen on imaging    LE Edema (resolved): etiology unclear, possible worsening pulm HTN or hepatopathy. ProBNP 795, but has been in similar range for past year. Albumin 2.9 on admission, edema may be 2/2 liver disease and low oncotic pressure. Albumin up to 3.1 this AM with only trace edema.??  ??  Abdominal pain - nausea and vomiting (resolved): presented with hypogastric abdominal pain, now resolved with symptomatic treatment. Lipase 69. Advancing diet as tolerated.  - Phenergan 12.5 PO q6h PRN  - CTM    Seizure disorder:  - carbamazepine 800 BID  - phenytoin 100 qAM + 200 qHS  - neurology c/s if adjusting AEDs    SS disease: followed by Dr. Clarene Duke in [facility]. No active crisis. Gets EPO and recently changed from HU to voxelotor.  - voxelotor 500 qday  - cholecalciferol 10 mcg qday    Macrocytic anemia - thrombocytopenia: Platelet 111, H/H 6.0/16.3 on admission, has been chronic for more than the past year. Macrocytic anemia likely 2/2 SS anemia and chronic hemolysis. LDH 1183, retic 6.6%. Thrombocytopenia possible due to liver disease, hemolysis, or medication side effect.  - folate 1 qday  - CTM    DVT prophylaxis: Lovenox 40 mg qday    Disposition: Floor    Please page the Hospitalist H pager at [phone] with questions.      Pending labs:   Pending Labs     Order Current Status    ED Extra Tubes In process    HIV Antigen/Antibody Combo In process    Hepatitis B Core Antibody, total In process    PINK EXTRA TUBE In process    Urine Culture In process          Subjective:   Patient denies any current abdominal pain or nausea. He locates the abdominal pain that brought him to the hospital as in the hypogastric region. He says his LE edema is currently better, but that he has experienced LE edema in the past. He also has noticed yellowing of his eyes over the past 3 weeks. He has noticed this in the past as well, most recently 3 months ago, when it was associated with LE edema.  He states that he  has been on seizure medications for as long as he can remember and has been on his current medications for at least 10 years. He admits not not using his oxygen at night very much. He states his preference for an IV over needle sticks for blood draws.    Objective:   Physical Exam:    Gen: well-appearing male in NAD, lying in bed.  HEENT: scleral icterus present, MMM.  Heart: RRR. No appreciable murmur. 2+ radial, dorsalis pedis pulses.  Lungs: unlabored breathing on RA, no use of accessory muscles.  Abdomen: normoactive bowel sounds, soft, NTND. No guarding. No appreciable organomegaly.  Extremities: warm, well-perfused. No cyanosis, minimal LE edema.  Neuro: awake, alert.  Psych: pleasant, cooperative, answers questions appropriately, mood and affect appropriate.      Vital signs in last 24 hours:  Temp:  [36.6 ??C (97.9 ??F)-37.3 ??C (99.1 ??F)] 36.9 ??C (98.4 ??F)  Heart Rate:  [63-88] 63  Resp:  [16-18] 16  BP: (111-122)/(56-61) 114/61  MAP (mmHg):  [77-79] 77  SpO2:  [92 %-96 %] 94 %  BMI (Calculated):  [22.74] 22.74    Intake/Output last 24 hours:    Intake/Output Summary (Last 24 hours) at 01/10/2020 1620  Last data filed at 01/10/2020 1100  Gross per 24 hour   Intake 1480 ml   Output 300 ml   Net 1180 ml       Medications:   Scheduled Meds:  ??? carBAMazepine  800 mg Oral BID   ??? cholecalciferol (vitamin D3-10 mcg (400 unit))  10 mcg Oral Daily   ??? enoxaparin (LOVENOX) injection  40 mg Subcutaneous Q24H   ??? folic acid  1 mg Oral Daily   ??? phenytoin  100 mg Oral Q AM   ??? phenytoin  200 mg Oral At bedtime   ??? polyethylen glycol  17 g Oral Daily   ??? voxelotor  500 mg Oral Daily     Continuous Infusions:   None    [place] Crane, MS4    I have verified all student documentation or findings.  I have personally performed or re-performed the physical exam and medical decision making.

## 2020-01-11 LAB — COMPREHENSIVE METABOLIC PANEL
ALBUMIN: 3.8 g/dL (ref 3.5–5.0)
ALKALINE PHOSPHATASE: 327 U/L — ABNORMAL HIGH (ref 38–126)
ALT (SGPT): 28 U/L (ref <50)
ANION GAP: 9 mmol/L (ref 7–15)
AST (SGOT): 150 U/L — ABNORMAL HIGH (ref 19–55)
BILIRUBIN TOTAL: 10.1 mg/dL — ABNORMAL HIGH (ref 0.0–1.2)
BLOOD UREA NITROGEN: 14 mg/dL (ref 7–21)
BUN / CREAT RATIO: 18
CALCIUM: 8.8 mg/dL (ref 8.5–10.2)
CHLORIDE: 113 mmol/L — ABNORMAL HIGH (ref 98–107)
CO2: 18 mmol/L — ABNORMAL LOW (ref 22.0–30.0)
CREATININE: 0.77 mg/dL (ref 0.70–1.30)
EGFR CKD-EPI AA MALE: >90 mL/min/{1.73_m2} (ref >=60)
EGFR CKD-EPI NON-AA MALE: >90 mL/min/{1.73_m2} (ref >=60)
POTASSIUM: 5.5 mmol/L — ABNORMAL HIGH (ref 3.5–5.0)
PROTEIN TOTAL: 9.4 g/dL — ABNORMAL HIGH (ref 6.5–8.3)
SODIUM: 140 mmol/L (ref 135–145)

## 2020-01-11 LAB — CALCIUM: Calcium:MCnc:Pt:Ser/Plas:Qn:: 8.8

## 2020-01-11 LAB — BILIRUBIN DIRECT: Bilirubin.glucuronidated+Bilirubin.albumin bound:MCnc:Pt:Ser/Plas:Qn:: 5.4 — ABNORMAL HIGH

## 2020-01-11 NOTE — Unmapped (Addendum)
[name] [name] is a 49 y.o. male with a PMHx of sickle cell anemia, pulmonary hypertension, and seizure disorder that presented to [facility] with abdominal pain, nausea and vomiting, and hyperbilirubinemia. Below are the details of his stay at [facility] organized according to problem.    Chronic liver disease  Long standing history of slowly worsening hyperbilirubinemia with marked increase over the past year. On admission, TBili 10.4, Dbili 6.6, AST 103, ALT 24, AlkPhos 285, GGT 357, mixed cholestatic/hepatic pattern. ProBNP 795, carbamazepine level WNL, phenytoin level low 6.6. Abd U/S +gallstones, but indeterminate for cholecystitis and with echogenic, nodular pattern suggestive of chronic liver disease. Echo with severely dilated LA, mod-severely dilated RA, mod pulm HTN, PASP 46. MRCP with cholelithiasis and GB wall thickening, mild dilation of CBD with distal narrowing but no definite e/o obstruction, no intrahepatic biliary duct dilation, hepatomegaly with nodule liver contour. Biliary was consulted and recommended EUS +/- ERCP which did not reveal any obstructive process.    Hepatology consulted and recommended hepatitis serologies, auto-immune workup and transjugular biopsy, performed by [facility] 01/14/20. Viral panel (HIV, HAV, HBV ab and quant negative, HCV negative). Autoimmune work-up unremarkable (ANA, anti-SM, liver/kidney microsome negative.)  Results from biopsy showed prominent congestion with mild-moderate cholestasis as well as robust perisinusoidal fibrosis without evidence of steatosis. Overall read was that these findings are consistent with exacerbation from his sickle cell disease. During the TJ biopsy, simultaneous measurement of hepatic wedge pressure demonstrated elevation consistent with portal hypertension.    There was some concern that the patient's pulmonary hypertension was contributing to his liver disease, so we consulted pulmonology, who recommended he follow-up in their outpatient clinic where he has already established care. Per their recommendations, we reiterated the importance of him using his home oxygen at night.     We also spoke with his Sickle Cell team who also saw him while he was inpatient. They recommended an EPO injection prior to discharge and will plan to follow up with him outpatient. They will also follow up on Gastroenterology's recommendations to investigate for the presence of gastric varices.     Hepatic encephalopathy  During his course, asterixis noted on exam and patient complained of feeling sleepier than normal. Added lactulose 5/23; by 5/26 he was fully adherent with QID dosing. Mental status improved on day 2 of lactulose therapy. On discharge, he reported feeling much more clear of mind and he was discharged on TID versus QID dosing to ensure adherence.    Abdominal pain - nausea and vomiting: presented with hypogastric abdominal pain. Lipase 69. He was treated symptomatically and his diet was advanced diet as tolerated.    Seizure disorder: he was continued on his home AEDs of carbamazepine and phenytoin during his hospitalization.    SS disease: followed by Dr. Clarene Duke in [facility]. He receives EPO twice weekly and recently changed from hydroxyurea to voxelotor. He continued his voxelotor and cholecalciferol throughout his hospitalization. Prior to discharge, he received 1 unit EPO as per their instructions.     Macrocytic anemia - thrombocytopenia: platelets 111, H/H 6.0/16.3, LDH 1183, retic 6.6% on admission, but has been chronic anemia and thrombocytopenia for more than the past year. Macrocytic anemia was thought to be likely secondary to SS anemia and chronic hemolysis. Thrombocytopenia likely due to liver disease, hemolysis, or medication side effect. His H/H and platelet count were monitored throughout his stay. On discharge, his H/H were 7.8/24 and platelets were 94. We shared these findings with hematology, who recommended EPO  prior to discharge. They plan to continue optimizing his SCD as an outpatient.    LE Edema (resolved): etiology unclear, possible worsening pulm HTN or hepatopathy. ProBNP 795, but has been in similar range for past year. Albumin 2.9 on admission with improvements to 3.1 and 3.8 over the subsequent days suggesting that his edema may have been secondary to liver disease and low oncotic pressure. On discharge, his albumin was 3.3 and he was without significant edema clinically.

## 2020-01-11 NOTE — Unmapped (Signed)
[facility] (ADVANCED ENDOSCOPY/BILIARY TEAM) CONSULTATION NOTE    [name] [name] is 49 y.o. male being seen in consultation at the request of Dr. Loretha Brasil for ERCP to rule out biliary obstruction?     Assessment and Plan:   49 y.o. male with h/o SS, MCI, Pulm HTN, seizure d/o, multiple red cell ab and h/o transfusion who presents to [facility] with LE edema, N/V and Hyperbilirubinemia. we have been consulted for possible biliary obstruction. Patient has several year history of direct hyperbilirubinemia with associated transaminase elevation as well (predominatly AST and alk phos elevation). [place] and MRCP were obtained for further evaluation and are concerning for chronic liver disease, gallstones, small volume ascites. MRCP additionally notes mild dilation of the common bile duct with distal narrowing, however no definite evidence of choledocholithiasis or obstructing lesion. No intrahepatic biliary ductal dilation. This seems to be a relatively new finding as [place] back in 2020 did not note any CBD dilatation. Given his abnormal LFTs with direct hyperbilirubinemia, we do recommend proceeding with upper EUS +/- ERCP to r/o biliary obstruction as a contributor to his abnormal liver enzymes. He is relatively pain free except for some TTP around the umbilicus so do not suspect his gallbladder findings are from cholecystitis.     I had a thorough discussion with the patient today in person concerning the scheduled procedure: EUS & ERCP. We discussed this procedure and the associated risks, which include but are not limited to pancreatitis, perforation, bleeding, infection, need for transfusion, need for surgery, and rarely death. These risks are acknowledged to be higher than routine endoscopic procedures. All questions were answered and the patient or authorized representative was willing to proceed with the planned procedure/s.     Recommendations:   - NPO for EUS +/- ERCP today 5/21  - Daily LFTs - Recommendations reviewed with primary team.    Thank you for this consult. Case reviewed and discussed with Dr. Penny Pia, who is in agreement with the plan. Please page the GI Biliary consult pager at [phone] with any further questions.     Oswaldo Conroy, FNP-C  Division of Gastroenterology  Advanced Therapeutic Endoscopy/Biliary Team    Chief Complaint: Nausea     History of Presenting Illness:  49 y.o. male with h/o SS, MCI, Pulm HTN, seizure d/o, multiple red cell ab and h/o transfusion who presents to [facility] with LE edema, N/V and Hyperbilirubinemia. we have been consulted for possible biliary obstruction.     [name] [name] presented to ED on 5/19 after developing acute N/V and lower abdominal pain (around umbilicus). He had several episodes of NBNB emesis. He had been feeling fine prior. He was provided antiemetics on arrival and has since felt improvement in his n/v, but remains nauseated this morning and for that reason has not eaten. He also complains of LE edema for several months.    On admission TBili 10.4, Dbili 6.6, AST 103, ALT 24, Alk Phos 285, GGT 357. Appears to have had steadily elevating hyperbilirubinemia over last several years (with both elevation in tbili and direct bilirubin). U/S +gallstones, but indeterminate for cholecystitis and with echogenic, nodular pattern suggestive of chronic liver disease. MRCP performed which notes cholelithiasis and gallbladder distention with mild wall thickening, which is again non-specific given underlying chronic liver disease and small volume ascites. There is  mild dilation of the common bile duct with distal narrowing, however no definite evidence of choledocholithiasis or obstructing lesion. No intrahepatic biliary ductal dilation. There is hepatomegaly with  nodular liver contour suggestive of chronic liver disease. Platelets 123, INR 1.68. Lipase normal.     On exam he is without significant abdominal pain but does endorse TTP around the umbilical region. No RUQ tenderness on deep palpation. He is tired but conversant. He denies ever having an ERCP or EGD. He denies any prior abdominal surgeries or known hx of gallbladder or biliary tract disease. He denies alcohol use.     Past Medical History:   Diagnosis Date   ??? Anemia    ??? Foot pain    ??? 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 Hypertension    ??? Pulmonary hypertension (CMS-HCC)    ??? Seizures (CMS-HCC)    ??? Sickle cell anemia (CMS-HCC)    ??? Vitamin D deficiency        Past Surgical History:   Procedure Laterality Date   ??? PR RIGHT HEART CATH O2 SATURATION & CARDIAC OUTPUT N/A 07/18/2017    Procedure: Right Heart Catheterization;  Surgeon: Zannie Cove, MD;  Location: [facility] CATH;  Service: Cardiology         Allergies: No Known Allergies      MEDICATIONS    Current Facility-Administered Medications:   ???  carBAMazepine (TEGretol  XR) 12 hr tablet 800 mg, 800 mg, Oral, BID, Escher Ernst Breach, MD, 800 mg at 01/11/20 1610  ???  cholecalciferol (vitamin D3-10 mcg (400 unit)) tablet 10 mcg, 10 mcg, Oral, Daily, Escher Lauren Howard-Williams, MD, 10 mcg at 01/11/20 9604  ???  diclofenac sodium (VOLTAREN) 1 % gel 2 g, 2 g, Topical, Daily PRN, Alphonzo Lemmings, MD  ???  enoxaparin (LOVENOX) syringe 40 mg, 40 mg, Subcutaneous, Q24H, Escher Lauren Howard-Williams, MD, 40 mg at 01/11/20 5409  ???  folic acid (FOLVITE) tablet 1 mg, 1 mg, Oral, Daily, Escher Lauren Howard-Williams, MD, 1 mg at 01/11/20 8119  ???  oxyCODONE (ROXICODONE) immediate release tablet 5 mg, 5 mg, Oral, Q8H PRN, Escher Lauren Howard-Williams, MD  ???  phenytoin (DILANTIN) ER capsule 100 mg, 100 mg, Oral, Q AM, Escher Ernst Breach, MD, 100 mg at 01/11/20 1478  ???  phenytoin (DILANTIN) ER capsule 200 mg, 200 mg, Oral, At bedtime, Alphonzo Lemmings, MD, 200 mg at 01/10/20 2216  ???  polyethylene glycol (MIRALAX) packet 17 g, 17 g, Oral, Daily, Escher Lauren Howard-Williams, MD, 17 g at 01/10/20 1041  ???  promethazine (PHENERGAN) tablet 12.5 mg, 12.5 mg, Oral, Q6H PRN, Alphonzo Lemmings, MD, 12.5 mg at 01/11/20 0538  ???  voxelotor (OXBRYTA) tablet 500 mg **PATIENT SUPPLIED MEDICATION**, 500 mg, Oral, Daily, Escher Ernst Breach, MD, 500 mg at 01/11/20 2956    FAMILY HISTORY  Family History   Problem Relation Age of Onset   ??? Hypertension Mother    ??? Hypertension Father    ??? No Known Problems Sister    ??? No Known Problems Brother    ??? No Known Problems Brother    ??? No Known Problems Brother    ??? No Known Problems Brother    ??? Sickle cell anemia Sister    ??? No Known Problems Sister    ??? Clotting disorder Paternal Grandmother    ??? No Known Problems Maternal Aunt    ??? No Known Problems Maternal Uncle    ??? No Known Problems Paternal Aunt    ??? No Known Problems Paternal Uncle    ??? No Known Problems Maternal Grandmother    ??? No Known Problems Maternal Grandfather    ???  No Known Problems Paternal Grandfather    ??? No Known Problems Other    ??? Seizures Neg Hx    ??? Pulmonary Hypertension Neg Hx    ??? Autoimmune disease Neg Hx    ??? Venous thrombosis Neg Hx    ??? Amblyopia Neg Hx    ??? Blindness Neg Hx    ??? Cancer Neg Hx    ??? Cataracts Neg Hx    ??? Diabetes Neg Hx    ??? Glaucoma Neg Hx    ??? Macular degeneration Neg Hx    ??? Retinal detachment Neg Hx    ??? Strabismus Neg Hx    ??? Stroke Neg Hx    ??? Thyroid disease Neg Hx        SOCIAL HISTORY  Social History     Tobacco Use   ??? Smoking status: Current Some Day Smoker     Packs/day: 1.00     Years: 0.50     Pack years: 0.50     Types: Cigars   ??? Smokeless tobacco: Never Used   ??? Tobacco comment: Pt smokes 1-2 cigars daily, Pt intends to cease tobacco use    Substance Use Topics   ??? Alcohol use: Not Currently     Alcohol/week: 0.0 standard drinks     Comment: occasional       Review of Systems:  The balance of 12 systems reviewed is negative except as noted in the HPI.???     Physical Exam  Vitals: Temp:  [36.5 ??C (97.7 ??F)-37 ??C (98.6 ??F)] 36.5 ??C (97.7 ??F)  Heart Rate:  [61-67] 64 Resp:  [16-18] 18  BP: (117-130)/(64-74) 125/64  SpO2:  [92 %-100 %] 100 %      Constitutional:   Chronically ill appearing african [place] male lying flat in bed in no apparent distress, eyes closed.    Psychiatric:   Thought organized, blunted affect, minimally interactive, not anxious appearing.   HEENT:   PERRL, conjunctiva clear, + scleral icterus.    Respiratory: Clear to auscultation, unlabored breathing.     Cardiovascular: regular rate and rhythm, normal S1 and S2, no murmur.     Gastrointestinal: Soft, normal bowel sounds, non-distended, TTP around umbilicus, no organomegaly or masses.     Genitourinary Not performed.     Musculoskeletal: No joint swelling or tenderness noted, no deformities.     Skin: No rashes, jaundice   Neurological: No focal deficits.        Labs:  Lab Results   Component Value Date    ALKPHOS 327 (H) 01/11/2020    BILITOT 10.1 (H) 01/11/2020    BILIDIR 5.40 (H) 01/11/2020    PROT 9.4 (H) 01/11/2020    ALBUMIN 3.8 01/11/2020    ALT 28 01/11/2020    AST 150 (H) 01/11/2020     Lab Results   Component Value Date    WBC 7.0 01/10/2020    HGB 5.8 (L) 01/10/2020    HCT 17.6 (L) 01/10/2020    PLT 123 (L) 01/10/2020       Imaging:   US Abdomen 01/09/20  IMPRESSION:  ??  Findings are indeterminate for cholecystitis. Gallstones are present. There is gallbladder wall thickening and small volume pericholecystic fluid which is nonspecific in the setting of liver disease and ascites.  ??  Heterogeneous, coarse liver parenchyma suggestive of chronic liver disease.  ??  MRI Abdomen/MRCP 01/11/20  IMPRESSION:  Limited study secondary to use of noncooperative protocol.  ??  -- Cholelithiasis and  gallbladder distention with mild wall thickening, which is again non-specific given underlying chronic liver disease and small volume ascites.   -- Mild dilation of the common bile duct with distal narrowing, however no definite evidence of choledocholithiasis or obstructing lesion. No intrahepatic biliary ductal dilation.  -- Hepatomegaly with nodular liver contour, suggestive of chronic liver disease.

## 2020-01-11 NOTE — Unmapped (Signed)
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 *         Hyperbilirubinemia - elevated LFTs: long standing history of slowly worsening hyperbilirubinemia with marked increase over the past year. On admission, TBili 10.4, Dbili 6.6, AST 103, ALT 24, AlkPhos 285, GGT 357, mixed cholestatic/hepatic pattern. ProBNP 795, carbamazepine level WNL, phenytoin level low 6.6. Abd U/S +gallstones, but indeterminate for cholecystitis and with echogenic, nodular pattern suggestive of chronic liver disease. Echo with severely dilated LA, mod-severely dilated RA, mod pulm HTN, PASP 46. MRCP with cholelithiasis and GB wall thickening, mild dilation of CBD with distal narrowing but no definite e/o obstruction, no intrahepatic biliary duct dilation, hepatomegaly with nodule liver contour. Etiology currently unclear. Possible sequelae of uncontrolled SS anemia, worsening pulm HTN, medication effect (carbamezapine), biliary obstruction, cholestasis.   - c/s biliary for EUS/ERCP to definitvely r/o obstruction  - f/u HIV, HBV core Ab, consider full hepatitis panel  - hepatology c/s if biliary w/u negative  - consider neurology c/s regarding seizure meds (to trial off carbamazepine) if no obstructive lesions seen on imaging    Abdominal pain - nausea and vomiting: presented with hypogastric abdominal pain, now resolved with symptomatic treatment. Lipase 69. Advancing diet as tolerated.  - Phenergan 12.5 PO q6h PRN, Zofran 4 ODT q8 PRN  - CTM    Seizure disorder:  - carbamazepine 800 BID  - phenytoin 100 qAM + 200 qHS  - neurology c/s if adjusting AEDs    SS disease: followed by Dr. Clarene Duke in [facility]c. No active crisis. Gets EPO and recently changed from HU to voxelotor.  - voxelotor 500 qday  - cholecalciferol 10 mcg qday    Macrocytic anemia - thrombocytopenia: Platelet 111, H/H 6.0/16.3 on admission, has been chronic for more than the past year. Macrocytic anemia likely 2/2 SS anemia and chronic hemolysis. LDH 1183, retic 6.6%. Thrombocytopenia possible due to liver disease, hemolysis, or medication side effect.  Ferritin WNL at 173.  - folate 1 qday  - CTM    LE Edema (resolved): etiology unclear, possible worsening pulm HTN or hepatopathy. ProBNP 795, but has been in similar range for past year. Albumin 2.9 on admission, edema may be 2/2 liver disease and low oncotic pressure. Albumin up to 3.8 this AM with no edema.??    DVT prophylaxis: Lovenox 40 mg qday    Disposition: Floor    Please page the Hospitalist H pager at [phone] with questions.      Pending labs:   Pending Labs     Order Current Status    ED Extra Tubes In process    HIV Antigen/Antibody Combo In process    Hepatitis B Core Antibody, total In process    PINK EXTRA TUBE In process          Subjective:   Patient of being bothered too much overnight and having a chance to sleep. Overnight he had an episode of dizziness, nausea, emesis x1. He reports getting a dose of Phenergan, but had  emesis quickly after taking it. He also reports hypogastric abdominal pain similar in location to when he was admitted.    Objective:   Physical Exam:    Gen: tired-appearing male in NAD, lying in bed sleeping.  HEENT: scleral icterus present, MMM.  Heart: RRR. No appreciable murmur.  Lungs: unlabored breathing on RA, no use of accessory muscles.  Abdomen: normoactive bowel sounds, soft, NTND. No guarding. No appreciable organomegaly.  Extremities: warm, well-perfused. No cyanosis or LE edema.  Neuro: awake and alert during exam.  Psych: irritable, somewhat cooperative, answers questions appropriately.      Vital signs in last 24 hours:  Temp:  [36.5 ??C (97.7 ??F)-37 ??C (98.6 ??F)] 36.5 ??C (97.7 ??F)  Heart Rate:  [61-67] 64  Resp:  [16-18] 18  BP: (114-130)/(61-74) 125/64  MAP (mmHg):  [77-90] 90  SpO2:  [92 %-100 %] 100 %    Intake/Output last 24 hours:    Intake/Output Summary (Last 24 hours) at 01/11/2020 1132  Last data filed at 01/11/2020 0349  Gross per 24 hour   Intake 250 ml   Output 350 ml   Net -100 ml       Medications:   Scheduled Meds:  ??? carBAMazepine  800 mg Oral BID   ??? cholecalciferol (vitamin D3-10 mcg (400 unit))  10 mcg Oral Daily   ??? enoxaparin (LOVENOX) injection  40 mg Subcutaneous Q24H   ??? folic acid  1 mg Oral Daily   ??? phenytoin  100 mg Oral Q AM   ??? phenytoin  200 mg Oral At bedtime   ??? polyethylen glycol  17 g Oral Daily   ??? voxelotor  500 mg Oral Daily     Continuous Infusions:   None    [place] Crane, MS4    I have verified all student documentation or findings.  I have personally performed or re-performed the physical exam and medical decision making.

## 2020-01-11 NOTE — Unmapped (Signed)
Pt is A&Ox4 during the day, VSS. MRI screening form done. NPO since 230pm for MRI early tonight. Self care.

## 2020-01-11 NOTE — Unmapped (Signed)
VENOUS ACCESS ULTRASOUND PROCEDURE NOTE    Indications:   Poor venous access.    The Venous Access Team has assessed this patient for the placement of a PIV. Ultrasound guidance was necessary to obtain access.     Procedure Details:  Identity of the patient was confirmed via name, medical record number and date of birth. The availability of the correct equipment was verified.    The vein was identified for ultrasound catheter insertion.  Field was prepared with necessary supplies and equipment.  Probe cover and sterile gel utilized.  Insertion site was prepped with chlorhexidine solution and allowed to dry.  The catheter extension was primed with normal saline.A(n) 20 g x 1.75 inch catheter was placed in the right AC with 1 attempt(s).     Catheter aspirated, 4 mL blood return present. The catheter was then flushed with 10 mL of normal saline. Insertion site cleansed, and dressing applied per manufacturer guidelines. The catheter was inserted without difficulty  by Michel Santee RN.      RN was notified.     Thank you,     Michel Santee RN Venous Access Team   [phone]     Workup / Procedure Time:  30 minutes    See vein image below:

## 2020-01-11 NOTE — Unmapped (Signed)
Patient A&Ox3, able to make needs know, complaint of abdominal pain refused to take PRN pain relief. Patient was scheduled for MRI, NPO started at 2pm but patient ate dinner. Restarted NPO, MRI notified about patient eating dinner. No SOB, patient in bed resting with no complaints.       Problem: Adult Inpatient Plan of Care  Goal: Plan of Care Review  Outcome: Progressing  Goal: Patient-Specific Goal (Individualization)  Outcome: Progressing  Goal: Absence of Hospital-Acquired Illness or Injury  Outcome: Progressing  Goal: Optimal Comfort and Wellbeing  Outcome: Progressing  Goal: Readiness for Transition of Care  Outcome: Progressing  Goal: Rounds/Family Conference  Outcome: Progressing     Problem: Fall Injury Risk  Goal: Absence of Fall and Fall-Related Injury  Outcome: Progressing     Problem: Electrolyte Imbalance  Goal: Electrolyte Balance  Outcome: Progressing     Problem: Fall Injury Risk  Goal: Absence of Fall and Fall-Related Injury  Outcome: Progressing     Problem: Nausea and Vomiting  Goal: Fluid and Electrolyte Balance  Outcome: Progressing

## 2020-01-12 LAB — CBC
HEMATOCRIT: 20.7 % — ABNORMAL LOW (ref 41.0–53.0)
HEMOGLOBIN: 6.8 g/dL — ABNORMAL LOW (ref 13.5–17.5)
MEAN CORPUSCULAR HEMOGLOBIN CONC: 32.8 g/dL (ref 31.0–37.0)
MEAN CORPUSCULAR HEMOGLOBIN: 38.1 pg — ABNORMAL HIGH (ref 26.0–34.0)
MEAN CORPUSCULAR VOLUME: 116 fL — ABNORMAL HIGH (ref 80.0–100.0)
NUCLEATED RED BLOOD CELLS: 53 /100{WBCs} — ABNORMAL HIGH (ref <=4)
RED BLOOD CELL COUNT: 1.78 10*12/L — ABNORMAL LOW (ref 4.50–5.90)
RED CELL DISTRIBUTION WIDTH: 24 % — ABNORMAL HIGH (ref 12.0–15.0)
WBC ADJUSTED: 6.3 10*9/L (ref 4.5–11.0)

## 2020-01-12 LAB — HEPATIC FUNCTION PANEL
ALBUMIN: 3.4 g/dL — ABNORMAL LOW (ref 3.5–5.0)
ALT (SGPT): 25 U/L (ref <50)
BILIRUBIN TOTAL: 10.4 mg/dL — ABNORMAL HIGH (ref 0.0–1.2)
PROTEIN TOTAL: 8.3 g/dL (ref 6.5–8.3)

## 2020-01-12 LAB — VITAMIN B-12: Cobalamins:MCnc:Pt:Ser/Plas:Qn:: 986 — ABNORMAL HIGH

## 2020-01-12 LAB — HEMOGLOBIN: Hemoglobin:MCnc:Pt:Bld:Qn:: 6.8 — ABNORMAL LOW

## 2020-01-12 LAB — EXTRA TUBE PINK: Lab: 0

## 2020-01-12 LAB — INR: Coagulation tissue factor induced.INR:RelTime:Pt:PPP:Qn:Coag: 1.57

## 2020-01-12 LAB — AST (SGOT): Aspartate aminotransferase:CCnc:Pt:Ser/Plas:Qn:: 106 — ABNORMAL HIGH

## 2020-01-12 LAB — FOLATE: Folate:MCnc:Pt:Ser/Plas:Qn:: >20 — ABNORMAL HIGH

## 2020-01-12 NOTE — Unmapped (Signed)
Patient's vs are stable,denies any pain,no n/v.he refused blood draw this morning,MD made aware,will continue to monitor.    Problem: Adult Inpatient Plan of Care  Goal: Plan of Care Review  Outcome: Ongoing - Unchanged  Goal: Patient-Specific Goal (Individualization)  Outcome: Ongoing - Unchanged  Goal: Absence of Hospital-Acquired Illness or Injury  Outcome: Ongoing - Unchanged  Goal: Optimal Comfort and Wellbeing  Outcome: Ongoing - Unchanged  Goal: Readiness for Transition of Care  Outcome: Ongoing - Unchanged  Goal: Rounds/Family Conference  Outcome: Ongoing - Unchanged     Problem: Fall Injury Risk  Goal: Absence of Fall and Fall-Related Injury  Outcome: Ongoing - Unchanged     Problem: Electrolyte Imbalance  Goal: Electrolyte Balance  Outcome: Ongoing - Unchanged     Problem: Fall Injury Risk  Goal: Absence of Fall and Fall-Related Injury  Outcome: Ongoing - Unchanged     Problem: Nausea and Vomiting  Goal: Fluid and Electrolyte Balance  Outcome: Ongoing - Unchanged     Problem: Pain Acute  Goal: Optimal Pain Control  Outcome: Ongoing - Unchanged

## 2020-01-12 NOTE — Unmapped (Signed)
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 *         Hyperbilirubinemia - elevated LFTs: long standing history of slowly worsening hyperbilirubinemia with marked increase over the past year. On admission, TBili 10.4, Dbili 6.6, AST 103, ALT 24, AlkPhos 285, GGT 357, mixed cholestatic/hepatic pattern. ProBNP 795, carbamazepine level WNL, phenytoin level low 6.6. Abd U/S +gallstones, but indeterminate for cholecystitis and with echogenic, nodular pattern suggestive of chronic liver disease. Echo with severely dilated LA, mod-severely dilated RA, mod pulm HTN, PASP 46. MRCP with cholelithiasis and GB wall thickening, mild dilation of CBD with distal narrowing but no definite e/o obstruction, no intrahepatic biliary duct dilation, hepatomegaly with nodule liver contour. Etiology currently unclear. Possible sequelae of uncontrolled SS anemia, worsening pulm HTN, medication effect (carbamezapine)   - c/s biliary for EUS/ERCP to definitvely r/o obstruction; done 5/21, EUS with nl CBD, no stones, so did not progress to ERCP  - f/u HIV, HBV core Ab, , Hep B S Ag Ab, Hep A IgM  - hepatology c/s today to see if have other recommendations for evaluation  - consider neurology c/s regarding seizure meds (to trial off carbamazepine) if no obstructive lesions seen on imaging    Abdominal pain - nausea and vomiting: presented with hypogastric abdominal pain, now improved with symptomatic treatment. Lipase 69. Advancing diet as tolerated.  - Phenergan 12.5 PO q6h PRN, Zofran 4 ODT q8 PRN  - CTM    Seizure disorder:  - carbamazepine 800 BID  - phenytoin 100 qAM + 200 qHS  - consider neurology c/s if adjusting AEDs    SS disease: followed by Dr. Clarene Duke in [facility]c. No active crisis. Gets EPO and recently changed from HU to voxelotor.  - voxelotor 500 qday  - cholecalciferol 10 mcg qday    Macrocytic anemia - thrombocytopenia: Platelet 111, H/H 6.0/16.3 on admission, has been chronic for more than the past year. Macrocytic anemia likely 2/2 SS anemia and chronic hemolysis. LDH 1183, retic 6.6%. Thrombocytopenia possible due to liver disease, hemolysis, or medication side effect.  Ferritin WNL at 173.  - folate 1 qday  - CTM    LE Edema (resolved): etiology unclear, possible worsening pulm HTN or hepatopathy. ProBNP 795, but has been in similar range for past year. Albumin 2.9 on admission, edema may be 2/2 liver disease and low oncotic pressure. Albumin up to 3.8 this AM with no edema.??    DVT prophylaxis: Lovenox 40 mg qday    Disposition: Floor    Please page the Hospitalist H pager at [phone] with questions.      Pending labs:   Pending Labs     Order Current Status    CBC In process    Folate Level In process    HIV Antigen/Antibody Combo In process    Hepatic Function Panel In process    Hepatitis B Core Antibody, total In process    PT-INR In process    Vitamin B12 Level In process  Subjective:   Went to EUS yesterday; CBD visualized and appeared normal caliber without stones or other evidence of obstruction, so ERCP not done.  Irritable this am, refused blood draw, says did not yet eat breakfast.    Objective:   Physical Exam:    Gen: tired-appearing male in NAD, lying in bed   HEENT: scleral icterus present, MMM.  Heart: RRR. No appreciable murmur.  Lungs: unlabored breathing on RA, no use of accessory muscles.  Abdomen: normoactive bowel sounds, soft, NTND. No guarding. No appreciable organomegaly.  Neuro: awake and alert during exam.  Psych: irritable, somewhat cooperative, answers questions appropriately.      Vital signs in last 24 hours:  Temp: [36.3 ??C (97.3 ??F)-37.1 ??C (98.8 ??F)] 36.3 ??C (97.3 ??F)  Heart Rate:  [56-94] 65  SpO2 Pulse:  [56-59] 57  Resp:  [10-20] 18  BP: (102-129)/(54-72) 129/65  MAP (mmHg):  [74-87] 87  SpO2:  [92 %-98 %] 96 %    Intake/Output last 24 hours:    Intake/Output Summary (Last 24 hours) at 01/12/2020 1432  Last data filed at 01/12/2020 0981  Gross per 24 hour   Intake 800 ml   Output 500 ml   Net 300 ml       Medications:   Scheduled Meds:  ??? carBAMazepine  800 mg Oral BID   ??? cholecalciferol (vitamin D3-10 mcg (400 unit))  10 mcg Oral Daily   ??? enoxaparin (LOVENOX) injection  40 mg Subcutaneous Q24H   ??? folic acid  1 mg Oral Daily   ??? phenytoin  100 mg Oral Q AM   ??? phenytoin  200 mg Oral At bedtime   ??? phytonadione (vitamin K1)  5 mg Oral Daily   ??? polyethylen glycol  17 g Oral Daily   ??? senna  1 tablet Oral BID   ??? voxelotor  500 mg Oral Daily     Continuous Infusions:   None

## 2020-01-12 NOTE — Unmapped (Signed)
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  Patient had longstanding hyperbilirubinemia that was mostly indirect (since 2014???2020) not atypical and most SSCD patients.  Since April 2020 patient has had a shift to mostly direct hyperbilirubinemia with peak occurring during this admission on 5/19 (direct: 6.6, total 10.4)  with MRCP showing cholelithiasis without cholecystitis but with mild CBD or intrahepatic dilation and with evidence of nodular liver contour suggestive of chronic liver disease.  Biliary team was consulted initially and patient underwent EUS on 5/21 with no sign of significant GB anomalies or biliary obstruction.  Patient denies significant history of alcohol.    Nodular liver contour, progressive worsening of patient's synthetic function (platelet 136, INR: 1.57, albumin 3.4) with concern for possible early cirrhosis.  Differential includes chronic sickle cell induced hepatopathy, congestive hepatopathy (mild pulmonary hypertension), autoimmune (positive ANA (1:160), and less likely drug-induced (Dilantin).  Based on age and history of sickle cell, this is the most etiology of patient's chronic liver disease.  History of sickle cell status post multiple transfusion increases risk of viral hepatitis and  iron load of which initial work-up is not consistent (acute hepatitis panel negative, no evidence of overload on iron studies or on MRI).Patient has had longstanding persistent alkaline phosphatase but most recently has had elevations in GGT which suggests liver rather than bone derived. Less likely dilantin induced immunoallergic hepatotoxicity based on longstanding use (typically occurs on initiation), lack of eosinophilia, normal drug level (althought not always necessary).    We would therefore recommend transjugular liver biopsy and venous pressures to calculate hepatic venous pressure gradient to better assess etiology, fibrosis and confirmation of portal hypertension.  We recommend additional serological work-up to rule out other potential causes.    Recommendations:  -please consult IR for TJ liver biopsy w/ HVPG(obtain wedged hepatic venous pressure & Free hepatic venous pressure), if large enough fluid pocket can pursue a diagnostic paracentesis (sending for albumin, protein)  -daily MELD labs including fractionated bilirubin   ???Please check zinc levels and supplement if low  -ANA, ASMA, AMA, and total IgG  -check free phenytoin level  -complete viral serology: HAV IgG, HBs Ab, HBc total Ab, HCV/HIV   -macrocytic anemia: folate/b12/zinc/B1; can start w/ folate/thiamine supplementation  -IV Vitamin K 10mg  x 3d  -Patient appears to have mild hepatic encephalopathy and may benefit from trial of lactulose      Pt was discussed with and seen by Dr. Piedad Climes who is in agreement with his plan.   Siri Cole MD,Ph.D.   GI Fellow      REASON FOR CONSULT: Work-up of hyperbilirubinemia and concern for chronic liver disease     INTERVAL EVENTS:  No acute overnight events    Physical Exam:  Gen: Cachectic appearing African-American male in no acute distress  HEENT: sclera icteric  OP: MMM, clear with no erythema or ulcers  CV: RRR  Resp: CTAB without respiratory distress  Abd: +BS, soft, distended abdomen, mild fluid wave, hepatomegaly  Ext: no peripheral edema  Neuro: Positive asterixis  Skin: No stigmata of chronic liver disease    Tests:   Recent Labs  01/10/20  0505 01/12/20  1345   WBC 7.0 6.3   HGB 5.8* 6.8*   MCV 112.6* 116.0*   PLT 123* 136*     Recent Labs     01/10/20  0505 01/11/20  0716   NA 139 140   K 4.6 5.5*   CL 112* 113* BUN 15 14   CREATININE 0.80 0.77   GLU 70 96   CALCIUM 8.3* 8.8     Recent Labs     01/10/20  0505 01/11/20  0716 01/12/20  1345   ALBUMIN 3.1* 3.8 3.4*   AST 95* 150* 106*   ALT 23 28 25    ALKPHOS 268* 327* 249*   BILITOT 9.2* 10.1* 10.4*     Recent Labs     01/10/20  0505 01/12/20  1345   INR 1.68 1.57     No results found for: AFPTM    Lab Results   Component Value Date    IRON 147 01/09/2020    FERRITIN 173.0 01/10/2020     No results found for: CHOL, TRIG, A1C  Lab Results   Component Value Date    VITAMINB12 986 (H) 01/12/2020    FOLATE >20.0 (H) 01/12/2020    VITDTOTAL 28.8 09/04/2019     Lab Results   Component Value Date    HBSAG Nonreactive 09/22/2018    HEPBSAB Nonreactive 05/07/2016    HEPCAB Nonreactive 09/22/2018    HIV Nonreactive 07/01/2017     Lab Results   Component Value Date    ANA Positive (A) 07/01/2017       MELD-Na score: 21 at 01/12/2020  1:45 PM  Calculated from:  Serum Creatinine: 0.77 mg/dL (Using min of 1 mg/dL) at 1/61/0960  4:54 AM  Serum Sodium: 140 mmol/L (Using max of 137 mmol/L) at 01/11/2020  7:16 AM  Total Bilirubin: 10.4 mg/dL at 0/98/1191  4:78 PM  INR(ratio): 1.57 at 01/12/2020  1:45 PM  Age: 49 years      Imaging:   MRI abdomen 01/11/2020  IMPRESSION:  Limited study secondary to use of noncooperative protocol.  ??  -- Cholelithiasis and gallbladder distention with mild wall thickening, which is again non-specific given underlying chronic liver disease and small volume ascites.   -- Mild dilation of the common bile duct with distal narrowing, however no definite evidence of choledocholithiasis or obstructing lesion. No intrahepatic biliary ductal dilation.  -- Hepatomegaly with nodular liver contour, suggestive of chronic liver disease.

## 2020-01-12 NOTE — Unmapped (Signed)
Patient free from falls and injury this shift. VSS. RA. A&O x4, but forgetful at times with delayed responses. No complaints of pain. Turns self in bed. Bed alarm on for safety as pt is now with generalized weakness. Had MRCP done this AM and Endoscopic ultrasound this afternoon. Call bell and bedside table within reach. No other needs verbalized at this time. Will continue to monitor.       Problem: Adult Inpatient Plan of Care  Goal: Plan of Care Review  Outcome: Progressing  Goal: Patient-Specific Goal (Individualization)  Outcome: Progressing  Goal: Absence of Hospital-Acquired Illness or Injury  Outcome: Progressing  Goal: Optimal Comfort and Wellbeing  Outcome: Progressing  Goal: Readiness for Transition of Care  Outcome: Progressing  Goal: Rounds/Family Conference  Outcome: Progressing     Problem: Fall Injury Risk  Goal: Absence of Fall and Fall-Related Injury  Outcome: Progressing     Problem: Electrolyte Imbalance  Goal: Electrolyte Balance  Outcome: Progressing     Problem: Fall Injury Risk  Goal: Absence of Fall and Fall-Related Injury  Outcome: Progressing     Problem: Nausea and Vomiting  Goal: Fluid and Electrolyte Balance  Outcome: Progressing

## 2020-01-13 LAB — GAMMAGLOBULIN; IGG: IgG:MCnc:Pt:Ser/Plas:Qn:: 2770 — ABNORMAL HIGH

## 2020-01-13 LAB — HEPATIC FUNCTION PANEL
ALKALINE PHOSPHATASE: 262 U/L — ABNORMAL HIGH (ref 38–126)
ALT (SGPT): 27 U/L (ref <50)
BILIRUBIN TOTAL: 11.6 mg/dL — ABNORMAL HIGH (ref 0.0–1.2)
PROTEIN TOTAL: 8.6 g/dL — ABNORMAL HIGH (ref 6.5–8.3)

## 2020-01-13 LAB — HEPATITIS A IGM ANTIBODY: Hepatitis A virus Ab.IgM:PrThr:Pt:Ser:Ord:: NONREACTIVE

## 2020-01-13 LAB — HEPATITIS B SURFACE ANTIBODY
HEPATITIS B SURFACE ANTIBODY QUANT: <8 m[IU]/mL (ref <8.00)
Hepatitis B virus surface Ab:PrThr:Pt:Ser:Ord:: NONREACTIVE

## 2020-01-13 LAB — PHENYTOIN FREE: Phenytoin.free:MCnc:Pt:Ser/Plas:Qn:: 1.99

## 2020-01-13 LAB — BILIRUBIN DIRECT: Bilirubin.glucuronidated+Bilirubin.albumin bound:MCnc:Pt:Ser/Plas:Qn:: 7 — ABNORMAL HIGH

## 2020-01-13 LAB — HIV ANTIGEN/ANTIBODY COMBO: HIV 1+2 Ab+HIV1 p24 Ag:PrThr:Pt:Ser/Plas:Ord:IA: NONREACTIVE

## 2020-01-13 LAB — HEPATITIS B CORE TOTAL ANTIBODY: Hepatitis B virus core Ab:PrThr:Pt:Ser/Plas:Ord:IA: NONREACTIVE

## 2020-01-13 LAB — PROTIME: Coagulation tissue factor induced:Time:Pt:PPP:Qn:Coag: 17.7 — ABNORMAL HIGH

## 2020-01-13 NOTE — Unmapped (Signed)
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 Hematology was consulted for evaluation of perioperative management in setting of sickle cell disease.    Patient is not currently in a pain crisis, but is being worked up for hyperbilirubinemia with plans for transjugular liver biopsy tomorrow. Differential diagnoses for hyperbilirubinemia include chronic liver disease and initially there was concern for obstruction, but MRCP and EUS have both been unrevealing, hence biopsy. Initially, there was thought that patient might need general anesthesia, but primary team notified us that IR is planning to do biopsy with moderate sedation only. Nevertheless, patients with sickle cell disease are at risk of post-op complications including but limited to vaso-occlusive crisis, acute chest syndrome, post-operative infections and congestive heart failure. Therefore, we recommend transfusing patient prior to procedure to reduce anemia, dilute sickled red cells and increase the level of oxygen in the blood.     Recommendations:   - Continue folic acid 1 mg daily   - Continue Voxelotor   - Please order incentive spirometer  - Please order 2 liters of supplemental O2 via nasal cannula nightly   - Please transfuse 2-units of pRBCs prior to procedure tomorrow    This patient has been seen and discussed with Dr. Isaiah Serge. These recommendations were discussed with the primary team.     Please contact the hematology fellow at [phone] with any further questions.    Cristie Mckinney M. Artis Flock, MD  Hematology/Oncology Fellow  Jan 13, 2020 3:18 PM    =========================================    HPI: [name] [name] is a 49 y.o. 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 Patient initially presented on 5/19 with vomiting and swelling in abdomen and lower extremities. Lab was notable for hemoglobin 6, LDH 1183,  total bilirubin 10.4, direct bilirubin 6.6, AST 103 and ALP 285. They obtained an [place] Abdement which showed gallstones and heterogeneous course liver parenchyma suggestive of chronic liver disease. Subsequently MRCP was done which showed cholelithiasis and gallbladder distention, mild dilation of CBD with distal narrow and hepatomegaly. Biliary was consulted and recommended EUS which was performed on 5/21 and didn't show any significant endoscopic findings, CBD was normal. GI is now considering a transjugular liver biopsy w/HVPG and RH cath on Monday, which would require general anesthesia. Therefore, hematology was consulted for pre-op management of HbSS disease and to discuss risk/benefits of procedure.     Regarding patient's sickle cell disease, he was last seen in February 2021. Baseline hemoglobin is ~6, LDH 3000-5000, bilirubin 3-5. He is currently on Voxelotor 500 mg daily and EPO 40K twice weekly. Not currently taking HU.    Review of Systems: Review of Systems - Negative except pain in extremities last night. Not currently present.     Past Medical History:   Diagnosis Date   ??? Anemia    ??? Foot pain    ??? 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 Hypertension    ??? Pulmonary hypertension (CMS-HCC)    ??? Seizures (CMS-HCC)    ??? Sickle cell  anemia (CMS-HCC)    ??? Vitamin D deficiency        Past Surgical History:   Procedure Laterality Date   ??? PR ENDOSCOPIC US EXAM, ESOPH N/A 01/11/2020    Procedure: UGI ENDOSCOPY; WITH ENDOSCOPIC ULTRASOUND EXAMINATION LIMITED TO THE ESOPHAGUS;  Surgeon: Jules Husbands, MD;  [facility];  Service: Gastroenterology   ??? PR RIGHT HEART CATH O2 SATURATION & CARDIAC OUTPUT N/A 07/18/2017    Procedure: Right Heart Catheterization;  Surgeon: Zannie Cove, MD;  Location: [facility] CATH;  Service: Cardiology       Family History   Problem Relation Age of Onset   ??? Hypertension Mother    ??? Hypertension Father    ??? No Known Problems Sister    ??? No Known Problems Brother    ??? No Known Problems Brother    ??? No Known Problems Brother    ??? No Known Problems Brother    ??? Sickle cell anemia Sister    ??? No Known Problems Sister    ??? Clotting disorder Paternal Grandmother    ??? No Known Problems Maternal Aunt    ??? No Known Problems Maternal Uncle    ??? No Known Problems Paternal Aunt    ??? No Known Problems Paternal Uncle    ??? No Known Problems Maternal Grandmother    ??? No Known Problems Maternal Grandfather    ??? No Known Problems Paternal Grandfather    ??? No Known Problems Other    ??? Seizures Neg Hx    ??? Pulmonary Hypertension Neg Hx    ??? Autoimmune disease Neg Hx    ??? Venous thrombosis Neg Hx    ??? Amblyopia Neg Hx    ??? Blindness Neg Hx    ??? Cancer Neg Hx    ??? Cataracts Neg Hx    ??? Diabetes Neg Hx    ??? Glaucoma Neg Hx    ??? Macular degeneration Neg Hx    ??? Retinal detachment Neg Hx    ??? Strabismus Neg Hx    ??? Stroke Neg Hx    ??? Thyroid disease Neg Hx        Social History     Socioeconomic History   ??? Marital status: Single     Spouse name: Not on file   ??? Number of children: 0   ??? Years of education: Not on file   ??? Highest education level: Not on file   Occupational History   ??? Occupation: disability   Tobacco Use   ??? Smoking status: Current Some Day Smoker     Packs/day: 1.00     Years: 0.50     Pack years: 0.50     Types: Cigars   ??? Smokeless tobacco: Never Used   ??? Tobacco comment: Pt smokes 1-2 cigars daily, Pt intends to cease tobacco use    Vaping Use   ??? Vaping Use: Never used   Substance and Sexual Activity   ??? Alcohol use: Not Currently     Alcohol/week: 0.0 standard drinks     Comment: occasional   ??? Drug use: No   ??? Sexual activity: Not on file   Other Topics Concern   ??? Not on file   Social History Narrative   ??? Not on file     Social Determinants of Health     Financial Resource Strain: Low Risk    ??? Difficulty of Paying Living Expenses: Not very hard  Food Insecurity: No Food Insecurity   ??? Worried About Programme researcher, broadcasting/film/video in the Last Year: Never true   ??? Ran Out of Food in the Last Year: Never true   Transportation Needs: No Transportation Needs   ??? Lack of Transportation (Medical): No   ??? Lack of Transportation (Non-Medical): No   Physical Activity:    ??? Days of Exercise per Week:    ??? Minutes of Exercise per Session:    Stress:    ??? Feeling of Stress :    Social Connections:    ??? Frequency of Communication with Friends and Family:    ??? Frequency of Social Gatherings with Friends and Family:    ??? Attends Religious Services:    ??? Database administrator or Organizations:    ??? Attends Banker Meetings:    ??? Marital Status:        Allergies: has No Known Allergies.    Medications:   Meds:  ??? carBAMazepine  800 mg Oral BID   ??? cholecalciferol (vitamin D3-10 mcg (400 unit))  10 mcg Oral Daily   ??? enoxaparin (LOVENOX) injection  40 mg Subcutaneous Q24H   ??? folic acid  1 mg Oral Daily   ??? ondansetron  4 mg Intravenous Once   ??? phenytoin  100 mg Oral Q AM   ??? phenytoin  200 mg Oral At bedtime   ??? phytonadione (AQUA-MEPHYTON) intravenous  10 mg Intravenous Daily   ??? polyethylen glycol  17 g Oral Daily   ??? senna  1 tablet Oral BID   ??? voxelotor  500 mg Oral Daily     Continuous Infusions:  PRN Meds:.diclofenac sodium, ondansetron, oxyCODONE, promethazine    Objective:   Vitals: Temp:  [36.3 ??C (97.3 ??F)-37.5 ??C (99.5 ??F)] 37.5 ??C (99.5 ??F)  Heart Rate:  [57-73] 73  Resp:  [18] 18  BP: (126-142)/(62-72) 126/67  MAP (mmHg):  [85] 85  SpO2:  [96 %-99 %] 98 %    GENERAL: Pleasant, well-appearing man in NAD  HEENT: PERRL, icteric sclerae, moist mucous membranes  NECK: Supple, no swelling  CV: Normal rate, regular rhythm, murmur present, no LE edema  RESP: Lungs clear to auscultation bilaterally; no wheezes, rhonchi, or rales; normal work of breathing  GI: Soft, non-tender, non-distended, normoactive bowel sounds, no organomegaly   EXT: Digits and nails examined without abnormality, no visible drainage   SKIN: Diffusely dry skin.   NEURO: Alert and oriented, speech intact, following commands, moving all extremities well, no focal deficits appreciated  PSYCH: Normal mood and appropriate affect      Test Results  Recent Labs     01/12/20  1345   WBC 6.3   HGB 6.8*   PLT 136*       Imaging: Radiology studies were personally reviewed

## 2020-01-13 NOTE — Unmapped (Signed)
Pt is A&Ox4, afebrile, VSS. No pain reported. Regular BM and UOP. Given vitamin K and VSS during and after transfusion. Pt is unstable walking. Bed alarm used.  Problem: Fall Injury Risk  Goal: Absence of Fall and Fall-Related Injury  Outcome: Progressing

## 2020-01-13 NOTE — Unmapped (Signed)
Patient denies any pian,vs are stable,tried to refused his bedtime meds but was redirectable. He had 1 episode of nausea and vomiting, about 100 ml of dark yellow emesis,received a dose of ODT zofran and was effective. Patient is very week, risk for fall,free from any injury during the shift,will continue to monitor.    Problem: Adult Inpatient Plan of Care  Goal: Plan of Care Review  Outcome: Ongoing - Unchanged  Goal: Patient-Specific Goal (Individualization)  Outcome: Ongoing - Unchanged  Goal: Absence of Hospital-Acquired Illness or Injury  Outcome: Ongoing - Unchanged  Goal: Optimal Comfort and Wellbeing  Outcome: Ongoing - Unchanged  Goal: Readiness for Transition of Care  Outcome: Ongoing - Unchanged  Goal: Rounds/Family Conference  Outcome: Ongoing - Unchanged     Problem: Fall Injury Risk  Goal: Absence of Fall and Fall-Related Injury  Outcome: Ongoing - Unchanged     Problem: Electrolyte Imbalance  Goal: Electrolyte Balance  Outcome: Ongoing - Unchanged     Problem: Fall Injury Risk  Goal: Absence of Fall and Fall-Related Injury  Outcome: Ongoing - Unchanged     Problem: Nausea and Vomiting  Goal: Fluid and Electrolyte Balance  Outcome: Ongoing - Unchanged     Problem: Pain Acute  Goal: Optimal Pain Control  Outcome: Ongoing - Unchanged     Problem: Skin Injury Risk Increased  Goal: Skin Health and Integrity  Outcome: Ongoing - Unchanged

## 2020-01-13 NOTE — Unmapped (Signed)
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 *         Hyperbilirubinemia - elevated LFTs: long standing history of slowly worsening hyperbilirubinemia with marked increase over the past year. On admission, TBili 10.4, Dbili 6.6, AST 103, ALT 24, AlkPhos 285, GGT 357, mixed cholestatic/hepatic pattern. ProBNP 795, carbamazepine level WNL, phenytoin level low 6.6. Abd U/S +gallstones, but indeterminate for cholecystitis and with echogenic, nodular pattern suggestive of chronic liver disease. Echo with severely dilated LA, mod-severely dilated RA, mod pulm HTN, PASP 46. MRCP with cholelithiasis and GB wall thickening, mild dilation of CBD with distal narrowing but no definite e/o obstruction, no intrahepatic biliary duct dilation, hepatomegaly with nodule liver contour. Etiology currently unclear. Possible sequelae of uncontrolled SS anemia, worsening pulm HTN, medication effect (carbamezapine)   - consulted biliary for EUS/ERCP to definitvely r/o obstruction; done 5/21, EUS with nl CBD, no stones, so did not progress to ERCP.  Does not seem to have obstructive process.  - f/u HIV, HBV core Ab, Hep B S Ag Ab, Hep A IgM, ANA, Anti-SMA, AMA.  Had HCV in 1/21, negative.  - hepatology consulting, appreciate recs. Recommend TG liver bx with VIR, including measurement of hepatic venous wedge pressures.  - trial lactulose given his sleepiness and asterixis    Abdominal pain - nausea and vomiting: presented with hypogastric abdominal pain, now improved with symptomatic treatment. Lipase 69. Advancing diet as tolerated.  - Phenergan 12.5 PO q6h PRN, Zofran 4 ODT q8 PRN  - CTM    Seizure disorder:  - carbamazepine 800 BID  - phenytoin 100 qAM + 200 qHS  - consider neurology c/s if adjusting AEDs    SS disease: followed by Dr. Clarene Duke in [facility]c. No active crisis. Gets EPO and recently changed from HU to voxelotor.  - voxelotor 500 qday  - cholecalciferol 10 mcg qday  - heme consult to ensure no peri-procedure recs with moderate sedation for IR procedure    Macrocytic anemia - thrombocytopenia: Platelet 111, H/H 6.0/16.3 on admission, has been chronic for more than the past year. Macrocytic anemia likely 2/2 SS anemia and chronic hemolysis. LDH 1183, retic 6.6%. Thrombocytopenia possible due to liver disease, hemolysis, or medication side effect.  Ferritin WNL at 173.  - folate 1 qday  - CTM    LE Edema (resolved): etiology unclear, possible worsening pulm HTN or hepatopathy. ProBNP 795, but has been in similar range for past year. Albumin 2.9 on admission, edema may be 2/2 liver disease and low oncotic pressure. Albumin up to 3.8 this AM with no edema.??    DVT prophylaxis: Lovenox 40 mg qday    Disposition: Floor    Please page the Hospitalist H pager at [phone] with questions.      Pending labs:   Pending Labs     Order Current Status    Anti-Nuclear Antibody (ANA) In process    HIV Antigen/Antibody Combo In process    Hepatitis A Antibody, IgM In process    Hepatitis  B Core Antibody, total In process    Hepatitis B Surface Antibody In process          Subjective:   Went to EUS yesterday; CBD visualized and appeared normal caliber without stones or other evidence of obstruction, so ERCP not done.  Irritable this am, refused blood draw, says did not yet eat breakfast.    Objective:   Physical Exam:    Gen: tired-appearing male in NAD, lying in bed   HEENT: scleral icterus present, MMM.  Heart: RRR. No appreciable murmur.  Lungs: unlabored breathing on RA, clear anterior  Abdomen: normoactive bowel sounds, soft, NTND. No guarding. No appreciable organomegaly.  Neuro: awake, mildly sleepy during exam, asterixis present.  Psych: mildly irritable, cooperative, answers questions appropriately.      Vital signs in last 24 hours:  Temp:  [36.3 ??C (97.3 ??F)-37.5 ??C (99.5 ??F)] 37.5 ??C (99.5 ??F)  Heart Rate:  [57-73] 73  Resp:  [18] 18  BP: (126-142)/(62-72) 126/67  MAP (mmHg):  [85] 85  SpO2:  [96 %-99 %] 98 %    Intake/Output last 24 hours:    Intake/Output Summary (Last 24 hours) at 01/13/2020 0935  Last data filed at 01/12/2020 2200  Gross per 24 hour   Intake 240 ml   Output 900 ml   Net -660 ml       Medications:   Scheduled Meds:  ??? carBAMazepine  800 mg Oral BID   ??? cholecalciferol (vitamin D3-10 mcg (400 unit))  10 mcg Oral Daily   ??? enoxaparin (LOVENOX) injection  40 mg Subcutaneous Q24H   ??? folic acid  1 mg Oral Daily   ??? ondansetron  4 mg Intravenous Once   ??? phenytoin  100 mg Oral Q AM   ??? phenytoin  200 mg Oral At bedtime   ??? phytonadione (AQUA-MEPHYTON) intravenous  10 mg Intravenous Daily   ??? polyethylen glycol  17 g Oral Daily   ??? senna  1 tablet Oral BID   ??? voxelotor  500 mg Oral Daily     Continuous Infusions:   None

## 2020-01-13 NOTE — Unmapped (Signed)
Adult Nutrition Assessment Note    Visit Type: RN Consult  Reason for Visit: Per Admission Nutrition Screen (Adult), Have you gained or lost 10 pounds in the past 3 months?, Have you had a decrease in food intake or appetite?    ASSESSMENT:  HPI & PMH: Patient is a 49 y.o. male admitted for hyperbilirubinemia/elevated LFTs, abdominal pain, nausea/vomiting, seizure disorder, SS disease, macrocytic anemia.     Past Medical History:   Diagnosis Date   ??? Anemia    ??? Foot pain    ??? 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 Hypertension    ??? Pulmonary hypertension (CMS-HCC)    ??? Seizures (CMS-HCC)    ??? Sickle cell anemia (CMS-HCC)    ??? Vitamin D deficiency        Per H&P, Pt reports he had intended to come to [facility] today for evaluation of LE edema. He states that at 11 when he was preparing to go he had sudden onset N/V and lower abdominal pain with 5-6 episodes of NBNB emesis. He states that he had been feeling fine prior to this and did not have abd pn/n/v prior to today    Nutrition Hx: Patient asleep and unarousable during multiple attempted visits. Currently NPO. RN reports that patient feeling sick and weak with low appetite. Documented intakes of 50-100%. Patient has currently been admitted for 2 days. Unable to obtain history at this time due to patient being asleep.     Nutritionally Pertinent Meds: Reviewed.   Scheduled Meds:  ??? carBAMazepine  800 mg Oral BID   ??? cholecalciferol (vitamin D3-10 mcg (400 unit))  10 mcg Oral Daily   ??? enoxaparin (LOVENOX) injection  40 mg Subcutaneous Q24H   ??? folic acid  1 mg Oral Daily   ??? lactulose  10 g Oral TID   ??? ondansetron  4 mg Intravenous Once   ??? phenytoin  100 mg Oral Q AM   ??? phenytoin  200 mg Oral At bedtime   ??? phytonadione (AQUA-MEPHYTON) intravenous  10 mg Intravenous Daily   ??? polyethylen glycol  17 g Oral Daily   ??? senna  1 tablet Oral BID   ??? voxelotor  500 mg Oral Daily     Labs:   Recent Labs   Lab Units 01/11/20  0716 01/10/20  0505 01/09/20  1249   SODIUM mmol/L 140 139 137   POTASSIUM mmol/L 5.5* 4.6 4.3   CHLORIDE mmol/L 113* 112* 109*   CO2 mmol/L 18.0* 23.0 19.9*   BUN mg/dL 14 15 18    CREATININE mg/dL 2.95 6.21 3.08   GLUCOSE mg/dL 96 70 657     POC glucose x24 hours:  No results in the last day    Abd/GI: LBM 05/22 per documentation.      Skin:   Patient Lines/Drains/Airways Status    Active Wounds     None               Current nutrition therapy order:   Nutrition Orders          NPO Sips with meds; Procedure/Test: NPO starting at 05/24 0001           Anthropometric Data:  -- Height: 172.7 cm (5' 8)   -- Last recorded weight: 68.8 kg (151 lb 10.8 oz)  -- Admission weight: 67.8 kg  -- Weight changes this admission: +1 kg (1.5%) since admission  -- IBW: 69.92 kg  -- Percent IBW: 98%  --  BMI: Body mass index is 23.06 kg/m??.     Last 5 Recorded Weights    01/10/20 0110 01/11/20 0349 01/12/20 0420   Weight: 67.8 kg (149 lb 8 oz) 68.9 kg (151 lb 14.4 oz) 68.8 kg (151 lb 10.8 oz)      -- Weight history PTA: +7.2 kg (11.7%) since 06/27/19 (6 months, 19 days) per documentation.    Wt Readings from Last 10 Encounters:   01/12/20 68.8 kg (151 lb 10.8 oz)   10/10/19 65.1 kg (143 lb 8 oz)   08/15/19 62.2 kg (137 lb 2 oz)   06/28/19 66.2 kg (146 lb)   06/27/19 61.6 kg (135 lb 14.4 oz)   05/08/19 63.7 kg (140 lb 6.4 oz)   03/21/19 63 kg (138 lb 14.4 oz)   03/07/19 65.4 kg (144 lb 2 oz)   03/02/19 65.1 kg (143 lb 9.6 oz)   03/02/19 64.3 kg (141 lb 11.2 oz)        Daily Estimated Nutrient Needs:   Energy: 1720-2060 kcals [25-30 kcal/kg using last recorded weight, 68.8 kg (01/13/20 1437)]  Protein: 85-105 gm [1.2-1.5 gm/kg using last recorded weight, 68.8 kg (01/13/20 1437)]  Carbohydrate:   [no restriction]  Fluid:   [per MD team]    Nutrition Focused Physical Exam:  Obtain when history can be given.      DIAGNOSIS:  Malnutrition Assessment using AND/ASPEN Clinical Characteristics:  Unable to complete malnutrition assessment at this time due to lack of physical exam and/or history.      Overall nutrition impression: Patient currently NPO and unable to meet estimated nutrition needs. He would likely benefit from advancing diet as appropriate to help meet estimated needs. Patient may require temporary nutrition support if diet is unable to be advanced.    GOALS:  - Patient to meet 75% of estimated energy needs through PO intake.    RECOMMENDATIONS AND INTERVENTIONS:  - Advance diet as appropriate.   - Consider temporary nutrition support if patient's diet is unable to be advanced.     RD Follow Up Parameters:  1-2 times per week (and more frequent as indicated)     Thank you for allowing me to participate in the care of this patient. Please feel free to page me with any questions.     Karie Chimera, MS, RD, LDN, CNSC  Pager: [phone]

## 2020-01-13 NOTE — Unmapped (Signed)
Informed Blood Consent    I have advised Arville Lime of his medical condition, and that the chances for his improvement or recovery will be significantly helped by receiving blood products by transfusion:  Such as packed red blood cells, fresh frozen plasma, platelets or cryoprecipitate (blood products).    I have explained the benefits that are expected from the patient being transfused and, as well, the risk.  The patient understands that although the blood products to be administered had been prepared and tested in accordance with strict scientific rules established by the [employer] of Blood [employer], there is still a very small - approximately 1 and 70,000 for Hemolytic Reactions and approximately 1 and 190,000 for TRALI - chance of blood products could be incompatible with the patient's body and a transfusion reaction can occur.  Although transfusion reactions can be treated successfully, the patient understands that on rare occasions they can be fatal (1 in 250,000 transfusions).  The patient also understands that allergic reactions to blood products with hives, itching, and fever are more common but can be treated and may not even require the transfusion to be stopped.  The patient understands that even with testing by the most up-to-date methods, there is a small chance that the blood products may contain a virus that may not be recognized as an infection for many months or years.  Even with proper testing, I discussed with the patient that the chance for contracting viral Hepatitis B is approximately 1 in 200,000, Hepatitis C is approximately 1 in 2 million, and HIV is approximately 1 in 2 million.    I gave the patient an opportunity to ask questions regarding the transfusion of blood products and I answered those questions to the patient's satisfaction.    ______________________________________________________________________  Instructions for Nursing:  Nurse will indicate at the top of the consent form (page 1) the portions in BOLD:  1.  I authorize Dr. Loretha Brasil and/or associates and assistants of his/her choice at [place] [facility] (referred to herein as 'facility') to perform the following procedure(s): Transfusion of Blood Products.    The patient should place his initials in the area under #4 indicating Authorization of blood products.    The patient's signature (page 2) will be obtained by nursing staff on the consent form and the nurse will verify this consent under the Witness Certification (page 2).  The paper form will subsequently be placed in the patient's physical chart (later to be scanned) as further evidence that the patient has given consent to the administration of blood products.

## 2020-01-13 NOTE — Unmapped (Signed)
[facility] - Liver Biopsy Consultation Note    Subjective:       Reason for Liver Biopsy:  1 Elevated Liver Function Test    Requested approach: 2 Transjugular   , if transjugular approach With pressures.    Requesting team:  Medicine and Gastroenterology     HPI:   [name] is a 49 y.o. 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 Patient had MRCP and EUS without concern for biliary obstruction. Patient also with nodular liver contour and worsening LFTs with some concern for developing cirrhosis. Unclear etiology of liver dysfunction.      Objective:      Imaging Studies:  01/11/20 MRI    Pertinent Laboratory Values:  WBC   Date Value Ref Range Status   01/12/2020 6.3 4.5 - 11.0 10*9/L Final   04/02/2014 7.1 4.5 - 11.0 10*9/L Final     HGB   Date Value Ref Range Status   01/12/2020 6.8 (L) 13.5 - 17.5 g/dL Final   45/40/9811 5.6 (L) 13.5 - 17.5 g/dL Final     HCT   Date Value Ref Range Status   01/12/2020 20.7 (L) 41.0 - 53.0 % Final   04/02/2014 16.4 (L) 41.0 - 53.0 % Final     Platelet   Date Value Ref Range Status   01/12/2020 136 (L) 150 - 440 10*9/L Final   04/02/2014 120 (L) 150 - 440 10*9/L Final     INR   Date Value Ref Range Status   01/13/2020 1.52  Final     Creatinine   Date Value Ref Range Status   01/11/2020 0.77 0.70 - 1.30 mg/dL Final   91/47/8295 6.21 0.70 - 1.30 mg/dL Final     Total Bilirubin   Date Value Ref Range Status   01/13/2020 11.6 (H) 0.0 - 1.2 mg/dL Final   30/86/5784 2.9 (H) 0.0 - 1.2 MG/DL Final     AST   Date Value Ref Range Status   01/13/2020 119 (H) 19 - 55 U/L Final   09/12/2012 106 (H) 19 - 55 U/L Final       Anticoaguation: No    Allergies:   No Known Allergies    Physical Exam:    Vitals:    01/13/20 1118   BP: 122/63   Pulse: 65   Resp: 16   Temp: 36.9 ??C (98.4 ??F)   SpO2: 99%     General: male in NAD.  Airway assessment: Class 1 - Can visualize soft palate, fauces, uvula, and tonsillar pillars    ASA Grade: ASA 3 - Patient with moderate systemic disease with functional limitations    Assessment:     [name] is a 49 y.o. male with h/o sickle cell disease, elevated LFTs and possible cirrhosis with portal HTN.  Pertinent history, imaging and laboratory values in patient's medical record have been reviewed.     Plan/Recommendations:       -VIR recommends proceeding with liver biopsy via 2 Transjugular  approach with pressure measurements.    - Anticipated procedure date: 5/24 pending VIR schedule  - Please make NPO night prior to procedure  - Please ensure recent CBC, Creatinine, and INR are available        Informed Consent:  This procedure has been fully reviewed with the patient/patient???s authorized representative. The risks, benefits and alternatives have been explained, and the patient/patient???s authorized  representative has consented to the procedure.  --The patient will accept blood products in an emergent situation.  --The patient does not have a Do Not Resuscitate order in effect.    Thank you for involving [place] in the care of this patient. Please page the VIR consult pager [phone]) with further questions, concerns, or if new issues arise.      Reece Levy, MD, Jan 13, 2020, 3:12 PM

## 2020-01-14 LAB — PROTIME-INR: INR: 1.36

## 2020-01-14 LAB — BASIC METABOLIC PANEL
BLOOD UREA NITROGEN: 17 mg/dL (ref 7–21)
BUN / CREAT RATIO: 20
CALCIUM: 8.9 mg/dL (ref 8.5–10.2)
CHLORIDE: 112 mmol/L — ABNORMAL HIGH (ref 98–107)
CREATININE: 0.87 mg/dL (ref 0.70–1.30)
EGFR CKD-EPI NON-AA MALE: >90 mL/min/{1.73_m2} (ref >=60)
GLUCOSE RANDOM: 132 mg/dL (ref 70–179)
POTASSIUM: 5 mmol/L (ref 3.5–5.0)
SODIUM: 140 mmol/L (ref 135–145)

## 2020-01-14 LAB — MEAN PLATELET VOLUME: Platelet mean volume:EntVol:Pt:Bld:Qn:Automated count: 10.7 — ABNORMAL HIGH

## 2020-01-14 LAB — ANA
ANA TITER 2: 1:80 {titer}
ANTINUCLEAR ANTIBODIES (ANA): POSITIVE — AB

## 2020-01-14 LAB — RETICULOCYTE ABSOLUTE COUNT, MANUAL: Lab: 181 — ABNORMAL HIGH

## 2020-01-14 LAB — CBC
HEMATOCRIT: 27.4 % — ABNORMAL LOW (ref 41.0–53.0)
HEMOGLOBIN: 8.9 g/dL — ABNORMAL LOW (ref 13.5–17.5)
MEAN CORPUSCULAR HEMOGLOBIN CONC: 32.6 g/dL (ref 31.0–37.0)
MEAN CORPUSCULAR HEMOGLOBIN: 34.8 pg — ABNORMAL HIGH (ref 26.0–34.0)
MEAN CORPUSCULAR VOLUME: 106.7 fL — ABNORMAL HIGH (ref 80.0–100.0)
NUCLEATED RED BLOOD CELLS: 48 /100{WBCs} — ABNORMAL HIGH (ref <=4)
PLATELET COUNT: 133 10*9/L — ABNORMAL LOW (ref 150–440)
RED BLOOD CELL COUNT: 2.56 10*12/L — ABNORMAL LOW (ref 4.50–5.90)
RED CELL DISTRIBUTION WIDTH: 22.8 % — ABNORMAL HIGH (ref 12.0–15.0)
WBC ADJUSTED: 5.8 10*9/L (ref 4.5–11.0)

## 2020-01-14 LAB — ANTINUCLEAR ANTIBODIES (ANA): Lab: POSITIVE — AB

## 2020-01-14 LAB — HEPATITIS B SURFACE ANTIGEN: Hepatitis B virus surface Ag:PrThr:Pt:Ser:Ord:: NONREACTIVE

## 2020-01-14 LAB — CALCIUM: Calcium:MCnc:Pt:Ser/Plas:Qn:: 8.9

## 2020-01-14 LAB — HEPATIC FUNCTION PANEL
ALBUMIN: 3.5 g/dL (ref 3.5–5.0)
ALT (SGPT): 34 U/L (ref <50)
AST (SGOT): 135 U/L — ABNORMAL HIGH (ref 19–55)
PROTEIN TOTAL: 8.5 g/dL — ABNORMAL HIGH (ref 6.5–8.3)

## 2020-01-14 LAB — PROTIME: Coagulation tissue factor induced:Time:Pt:PPP:Qn:Coag: 16 — ABNORMAL HIGH

## 2020-01-14 LAB — ALBUMIN: Albumin:MCnc:Pt:Ser/Plas:Qn:: 3.5

## 2020-01-14 NOTE — Unmapped (Signed)
VENOUS ACCESS ULTRASOUND PROCEDURE NOTE    Indications:   Poor venous access.    The Venous Access Team has assessed this patient for the placement of a PIV. Ultrasound guidance was necessary to obtain access.     Procedure Details:  Identity of the patient was confirmed via name, medical record number and date of birth. The availability of the correct equipment was verified.    The vein was identified for ultrasound catheter insertion.  Field was prepared with necessary supplies and equipment.  Probe cover and sterile gel utilized.  Insertion site was prepped with chlorhexidine solution and allowed to dry.  The catheter extension was primed with normal saline.A(n) 22 g x 1.75 inch catheter was placed in the right forearm with 1 attempt(s).     Catheter aspirated, 3 mL blood return present. The catheter was then flushed with 10 mL of normal saline. Insertion site cleansed, and dressing applied per manufacturer guidelines. The catheter was inserted with difficulty due to poor vasculature  by Melodye Ped RN.     Rhoda RN was notified.     Thank you,     Melodye Ped RN Venous Access Team   [phone]     Workup / Procedure Time:  30 minutes    See vein image below:

## 2020-01-14 NOTE — Unmapped (Cosign Needed)
VIR Post-Procedure Note    Procedure Name: Transjugular liver biopsy    Pre-Op Diagnosis: Elevated LFTs    Post-Op Diagnosis: Same as pre-operative diagnosis    VIR Providers    Attending: Dr. Alla German  Assistant: Dr. Levert Feinstein, Dr. Casandra Doffing    Description of procedure: Transjugular liver biopsy.  Pressure gradient was 13 mmHg.     Sedation: Moderate    Estimated Blood Loss: approximately 5 mL  Specimens: 3 cores     Complications: None      See detailed procedure note with images in PACS (IMPAX).    The patient tolerated the procedure well without incident or complication and was returned to the Floor in stable condition.    Levert Feinstein, MD  01/14/2020 1:19 PM

## 2020-01-14 NOTE — Unmapped (Signed)
Pt is A&Ox4, sleep most of the day d/t feeling of sick and weak. VS in normal range. No pain reported. Fall risk d/t unsteady gait. Low appetite. Started blood transfusion and 1/2 was given during the shift.

## 2020-01-14 NOTE — Unmapped (Signed)
PHYSICAL THERAPY  Evaluation (01/14/20 1200)     Patient Name:  [name] [name]       Medical Record Number: [id number]   Date of Birth: [birth year]  Sex: Male            Treatment Diagnosis: weakness    ASSESSMENT  Problem List: Decreased strength, Decreased mobility, Decreased safety awareness, Gait deviation, Decreased coordination, Decreased endurance, Dizziness/vertigo, Impaired balance     Assessment : 49 y.o. malewith  long standing history of slowly worsening hyperbilirubinemia with marked increase over the past year. On admission, TBili 10.4, Dbili 6.6, AST 103, ALT 24, AlkPhos 285, GGT 357, mixed cholestatic/hepatic pattern. ProBNP 795, carbamazepine level WNL, phenytoin level low 6.6. Abd U/S +gallstones, but indeterminate for cholecystitis and with echogenic, nodular pattern suggestive of chronic liver disease. Echo with severely dilated LA, mod-severely dilated RA, mod pulm HTN, PASP 46. MRCP with cholelithiasis and GB wall thickening, mild dilation of CBD with distal narrowing but no definite e/o obstruction, no intrahepatic biliary duct dilation, hepatomegaly with nodule liver contour. Etiology currently unclear. Possible sequelae of uncontrolled SS anemia, worsening pulm HTN, medication effect (carbamezapine). Pt currently below his baseline for basic mobility 2/2 weakness. He quires SBA and RW for ambulation.Recommend post acute PT 3x/wk for household distances. After a review of the personal factors, comorbidities, clinical presentation, and examination of the number of affected body systems, the patient presents as a moderate complexity case.     Today's Interventions: eval, gait and transfer training, ther ex, bed mobility, sitting and standing balance activiities, educated on  importance of ther ex and OOB mobility.                    Clinical Decision Making: Moderate     PLAN  Planned Frequency of Treatment:  1-2x per day for: 3-4x week      Planned Interventions: Airway Clearance, Balance activities, Diaphragmatic / Pursed-lip breathing, Education - Patient, Education - Family / caregiver, Endurance activities, Environmental support, Functional mobility, Investment banker, operational, Home exercise program, Movement facilitation, Positioning, Stair training, Self-care / Home training, Therapeutic exercise, Therapeutic activity, Therapist provided opportunity for spontaneous movement, Transfer training    Post-Discharge Physical Therapy Recommendations:  3x weekly (household distances)    PT DME Recommendations: Walker (rolling)           Goals:   Patient and Family Goals: Feel better.    Long Term Goal #1: Return to PLOF 5 wks       SHORT GOAL #1: pt will ambulate 200' independently with or with LRAD.              Time Frame : 2 weeks  SHORT GOAL #2: pt will ascend/descend 3 steps independently              Time Frame : 2 weeks  SHORT GOAL #3: pt will independent with HEP              Time Frame : 2 weeks                                        Prognosis:  Good  Positive Indicators: age, motivation  Barriers to Discharge: Decreased caregiver support, Endurance deficits, Impaired Balance, Gait instability, Decreased safety awareness    SUBJECTIVE  Patient reports: I use a RW when  I need to.  Current Functional Status: Pt t supine  in bed upon arrival.     Prior Functional Status: indpendent  ambulation with AD. uses RW when he is not feeling good.  Equipment available at home: [employer]     Past Medical History:   Diagnosis Date   ??? Anemia    ??? Foot pain    ??? 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 Hypertension    ??? Pulmonary hypertension (CMS-HCC)    ??? Seizures (CMS-HCC)    ??? Sickle cell anemia (CMS-HCC)    ??? Vitamin D deficiency     Social History     Tobacco Use   ??? Smoking status: Current Some Day Smoker     Packs/day: 1.00     Years: 0.50     Pack years: 0.50     Types: Cigars   ??? Smokeless tobacco: Never Used   ??? Tobacco comment: Pt smokes 1-2 cigars daily, Pt intends to cease tobacco use Substance Use Topics   ??? Alcohol use: Not Currently     Alcohol/week: 0.0 standard drinks     Comment: occasional      Past Surgical History:   Procedure Laterality Date   ??? PR ENDOSCOPIC US EXAM, ESOPH N/A 01/11/2020    Procedure: UGI ENDOSCOPY; WITH ENDOSCOPIC ULTRASOUND EXAMINATION LIMITED TO THE ESOPHAGUS;  Surgeon: Jules Husbands, MD;  [facility];  Service: Gastroenterology   ??? PR RIGHT HEART CATH O2 SATURATION & CARDIAC OUTPUT N/A 07/18/2017    Procedure: Right Heart Catheterization;  Surgeon: Zannie Cove, MD;  Location: [facility] CATH;  Service: Cardiology    Family History   Problem Relation Age of Onset   ??? Hypertension Mother    ??? Hypertension Father    ??? No Known Problems Sister    ??? No Known Problems Brother    ??? No Known Problems Brother    ??? No Known Problems Brother    ??? No Known Problems Brother    ??? Sickle cell anemia Sister    ??? No Known Problems Sister    ??? Clotting disorder Paternal Grandmother    ??? No Known Problems Maternal Aunt    ??? No Known Problems Maternal Uncle    ??? No Known Problems Paternal Aunt    ??? No Known Problems Paternal Uncle    ??? No Known Problems Maternal Grandmother    ??? No Known Problems Maternal Grandfather    ??? No Known Problems Paternal Grandfather    ??? No Known Problems Other    ??? Seizures Neg Hx    ??? Pulmonary Hypertension Neg Hx    ??? Autoimmune disease Neg Hx    ??? Venous thrombosis Neg Hx    ??? Amblyopia Neg Hx    ??? Blindness Neg Hx    ??? Cancer Neg Hx    ??? Cataracts Neg Hx    ??? Diabetes Neg Hx    ??? Glaucoma Neg Hx    ??? Macular degeneration Neg Hx    ??? Retinal detachment Neg Hx    ??? Strabismus Neg Hx    ??? Stroke Neg Hx    ??? Thyroid disease Neg Hx         Allergies: Patient has no known allergies.                Objective Findings  Precautions / Restrictions  Precautions: Falls precautions  Weight Bearing Status: Non-applicable  Required Braces or Orthoses: Non-applicable    Communication Preference: Verbal, Visual  Pain Comments: no complaints of pain.  Medical Tests / Procedures: reviewed in epic  Equipment / Environment: Vascular access (PIV, TLC, Port-a-cath, PICC)    At Rest: VSS  With Activity: NAD  Orthostatics: no s/sx  Airway Clearance: mobilization    Living Situation  Living Environment: Physiological scientist  Lives With: Alone (parents live close by and can help as needed.)  Home Living: One level home, Stairs to enter with rails, Tub/shower unit, Standard height toilet  Rail placement (outside): Bilateral rails  Number of Stairs to Enter (outside): 3           Skin Inspection: Visible skin appears intact    UE ROM/Strength: Left Intact, Right Intact  UE comment: >3/5  LE ROM/Strength: Left Intact, Right Intact  LE comment: >+3/5                Coordination: wnls         Balance: SBA with RW for standing         Bed Mobility: Independent  Transfers: Independent for sup <> sit <>stand   Gait  Level of Assistance: Standby assist, set-up cues, supervision of patient - no hands on  Assistive Device: Front wheel walker  Gait: pt ambulated 50' x 4 with RW and SBA  Stairs: not tested       Endurance: fair    Physical Therapy Session Duration  PT Individual [mins]: 40    Medical Staff Made Aware: RN    I attest that I have reviewed the above information.  Signed: Daiva Huge, PT  Filed 01/14/2020

## 2020-01-14 NOTE — Unmapped (Signed)
Patient received a total of 2 units of PRBC,tolerated well. He c/o nausea, this nurse left his room to get some zofran for the pt,call bell went off, found patient on the floor with his blankets wrapped around him,he said he rolled out from the bed. [facility]/nurse practitioner came by to see the patient. Patient denies any pain or hiting his head, or losing consciousness,will continue to monitor.   Problem: Adult Inpatient Plan of Care  Goal: Plan of Care Review  Outcome: Ongoing - Unchanged  Goal: Patient-Specific Goal (Individualization)  Outcome: Ongoing - Unchanged  Goal: Absence of Hospital-Acquired Illness or Injury  Outcome: Ongoing - Unchanged  Goal: Optimal Comfort and Wellbeing  Outcome: Ongoing - Unchanged  Goal: Readiness for Transition of Care  Outcome: Ongoing - Unchanged  Goal: Rounds/Family Conference  Outcome: Ongoing - Unchanged     Problem: Fall Injury Risk  Goal: Absence of Fall and Fall-Related Injury  Outcome: Ongoing - Unchanged     Problem: Electrolyte Imbalance  Goal: Electrolyte Balance  Outcome: Ongoing - Unchanged     Problem: Fall Injury Risk  Goal: Absence of Fall and Fall-Related Injury  Outcome: Ongoing - Unchanged     Problem: Nausea and Vomiting  Goal: Fluid and Electrolyte Balance  Outcome: Ongoing - Unchanged     Problem: Pain Acute  Goal: Optimal Pain Control  Outcome: Ongoing - Unchanged     Problem: Skin Injury Risk Increased  Goal: Skin Health and Integrity  Outcome: Ongoing - Unchanged

## 2020-01-14 NOTE — Unmapped (Addendum)
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 *         Hyperbilirubinemia - elevated LFTs: long standing history of slowly worsening hyperbilirubinemia with marked increase over the past year. On admission, TBili 10.4, Dbili 6.6, AST 103, ALT 24, AlkPhos 285, GGT 357, mixed cholestatic/hepatic pattern. ProBNP 795, carbamazepine level WNL, phenytoin level low 6.6. Abd U/S +gallstones, but indeterminate for cholecystitis and with echogenic, nodular pattern suggestive of chronic liver disease. Echo with severely dilated LA, mod-severely dilated RA, mod pulm HTN, PASP 46. MRCP with cholelithiasis and GB wall thickening, mild dilation of CBD with distal narrowing but no definite e/o obstruction, no intrahepatic biliary duct dilation, hepatomegaly with nodule liver contour. Etiology currently unclear. Diff dx includes sequelae of uncontrolled SS anemia, worsening pulm HTN, medication effect (carbamezapine), less likely auto-immune  - c/s biliary for EUS/ERCP to definitvely r/o obstruction; done 5/21, EUS with nl CBD, no stones, so did not progress to ERCP.  Obstructive pathology unlikely.  - HIV, Hep A, HBV ab and quant negative. Had HCV 1/21, negative  - Anti-smooth muscle, liver/kidney microsome - pending  - hepatology consulting, appreciate recs. TJ liver bx with VIR today, to include measurement of hepatic venous wedge pressures  - Continue lactulose with plan to titrate to 3 soft/easy to pass BMs per day    Abdominal pain - nausea and vomiting: overnight, bilious to clear, slightly blood-tinged emesis. Currently NPO for procedure today. - IVF at 75 ml/hr for dehydration  - Phenergan 12.5 PO q6h PRN, Zofran 4 ODT q8 PRN  - CTM    Possible hepatic encephalopathy  Asterixis noted on exam and patient complains of feeling sleepier than normal.  Added lactulose 5/23, monitor symptoms    Seizure disorder:  - carbamazepine 800 BID  - phenytoin 100 qAM + 200 qHS  - consider neurology c/s if adjusting AEDs    SS disease: followed by Dr. Clarene Duke in HemeOnc. No active crisis. Gets EPO and recently changed from HU to voxelotor.  - voxelotor 500 qday  - cholecalciferol 10 mcg qday  - s/p 2 units pRBCs yesterday for procedure    Macrocytic anemia - thrombocytopenia: Platelet 111, H/H 6.0/16.3 on admission, has been chronic for more than the past year. Macrocytic anemia likely 2/2 SS anemia and chronic hemolysis. LDH 1183, retic 6.6%. Thrombocytopenia possible due to liver disease, hemolysis, or medication side effect.  Ferritin WNL at 173.  - folate 1 qday  - CTM    LE Edema (resolved): etiology unclear, possible worsening pulm HTN or hepatopathy. ProBNP 795, but has been in similar range for past year. Albumin 2.9 on admission, edema may be 2/2 liver disease and low oncotic pressure.     DVT prophylaxis: Lovenox 40 mg qday    Disposition: Floor    Please page the Hospitalist H pager at [phone] with questions.      Pending labs:   Pending Labs     Order Current Status    Anti-Nuclear Antibody (ANA) In process  Antimitochondrial Antibody In process    Hemoglobin/Thalassemia Profile In process    Hepatitis B Surface Antigen In process    Zinc Level, Serum In process          Subjective:   Says he fell out of bed, last night from accidentally rolling over after calling to nurse for anti-nausea medication. Reports that his jaw hurts some but that he did not hit his head or lose consciousness. Also reports that his nausea started last night, describing the emesis as yellow, with some blood. Endorses some abdominal pain. He wonders if pain is from the lactulose which he started yesterday. Reports only 1 BM and it was hard to pass. He does say that his speech seems more slurred than before coming to the hospital for this admission. Thinks it may have worsened since being here. We did discuss the procedure today and answered all questions.    Objective:   Physical Exam:    Gen: Thin male lying in bed, speaks with eyes closed  HEENT: scleral icterus present, MMM.  Heart: RRR. No appreciable murmur. Bilateral dorsal pedis and radial pulses 2+  Lungs: Unlabored breathing on RA, no use of accessory muscles.  Abdomen: Soft, NTND. No guarding. No appreciable organomegaly.  Ext: Trace bilateral nonpitting edema, most concentrated in ankles  Neuro: awake and alert during exam. Bilateral asterixis, R>L  Psych: Calm, cooperative, pleasant      Vital signs in last 24 hours:  Temp:  [36.6 ??C (97.9 ??F)-37.2 ??C (99 ??F)] 37 ??C (98.6 ??F)  Heart Rate:  [59-76] 61  Resp:  [16-18] 18  BP: (116-135)/(56-95) 135/75  MAP (mmHg):  [76-86] 76  SpO2:  [96 %-100 %] 100 %    Intake/Output last 24 hours:    Intake/Output Summary (Last 24 hours) at 01/14/2020 1001  Last data filed at 01/13/2020 2245  Gross per 24 hour   Intake 860 ml   Output ???   Net 860 ml       Medications:   Scheduled Meds:  ??? carBAMazepine  800 mg Oral BID   ??? cholecalciferol (vitamin D3-10 mcg (400 unit))  10 mcg Oral Daily   ??? enoxaparin (LOVENOX) injection  40 mg Subcutaneous Q24H   ??? folic acid  1 mg Oral Daily   ??? lactulose  10 g Oral TID   ??? phenytoin  100 mg Oral Q AM   ??? phenytoin  200 mg Oral At bedtime   ??? phytonadione (AQUA-MEPHYTON) intravenous  10 mg Intravenous Daily   ??? polyethylen glycol  17 g Oral Daily   ??? senna  1 tablet Oral BID   ??? voxelotor  500 mg Oral Daily     Continuous Infusions:   None    I have verified all student documentation or findings.  I have personally performed or re-performed the physical exam and medical decision making.

## 2020-01-15 LAB — ANTI-MITOCHONDRIAL ANTIBODY: Lab: NEGATIVE

## 2020-01-15 LAB — BASIC METABOLIC PANEL
ANION GAP: 5 mmol/L — ABNORMAL LOW (ref 7–15)
BLOOD UREA NITROGEN: 18 mg/dL (ref 7–21)
BUN / CREAT RATIO: 24
CALCIUM: 8.5 mg/dL (ref 8.5–10.2)
CHLORIDE: 110 mmol/L — ABNORMAL HIGH (ref 98–107)
CO2: 24 mmol/L (ref 22.0–30.0)
CREATININE: 0.74 mg/dL (ref 0.70–1.30)
EGFR CKD-EPI AA MALE: >90 mL/min/{1.73_m2} (ref >=60)
EGFR CKD-EPI NON-AA MALE: >90 mL/min/{1.73_m2} (ref >=60)
GLUCOSE RANDOM: 83 mg/dL (ref 70–179)
SODIUM: 139 mmol/L (ref 135–145)

## 2020-01-15 LAB — ALBUMIN: Albumin:MCnc:Pt:Ser/Plas:Qn:: 3.1 — ABNORMAL LOW

## 2020-01-15 LAB — RETICULOCYTE ABSOLUTE COUNT, MANUAL: Lab: 327.7 — ABNORMAL HIGH

## 2020-01-15 LAB — INR: Coagulation tissue factor induced.INR:RelTime:Pt:PPP:Qn:Coag: 1.39

## 2020-01-15 LAB — HEPATIC FUNCTION PANEL
ALBUMIN: 3.1 g/dL — ABNORMAL LOW (ref 3.5–5.0)
ALT (SGPT): 30 U/L (ref <50)
AST (SGOT): 108 U/L — ABNORMAL HIGH (ref 19–55)
BILIRUBIN DIRECT: 6.7 mg/dL — ABNORMAL HIGH (ref 0.00–0.40)
BILIRUBIN TOTAL: 10.9 mg/dL — ABNORMAL HIGH (ref 0.0–1.2)
PROTEIN TOTAL: 7.8 g/dL (ref 6.5–8.3)

## 2020-01-15 LAB — LIVER-KIDNEY MICROSOMAL AB: Liver kidney microsomal 1 Ab:ACnc:Pt:Ser:Qn:: <5

## 2020-01-15 LAB — CBC
HEMATOCRIT: 27.8 % — ABNORMAL LOW (ref 41.0–53.0)
HEMOGLOBIN: 9 g/dL — ABNORMAL LOW (ref 13.5–17.5)
MEAN CORPUSCULAR HEMOGLOBIN: 35.1 pg — ABNORMAL HIGH (ref 26.0–34.0)
MEAN PLATELET VOLUME: 10.8 fL — ABNORMAL HIGH (ref 7.0–10.0)
NUCLEATED RED BLOOD CELLS: 37 /100{WBCs} — ABNORMAL HIGH (ref <=4)
PLATELET COUNT: 120 10*9/L — ABNORMAL LOW (ref 150–440)
RED BLOOD CELL COUNT: 2.58 10*12/L — ABNORMAL LOW (ref 4.50–5.90)
RED CELL DISTRIBUTION WIDTH: 21.9 % — ABNORMAL HIGH (ref 12.0–15.0)
WBC ADJUSTED: 6.8 10*9/L (ref 4.5–11.0)

## 2020-01-15 LAB — LACTATE DEHYDROGENASE: Lactate dehydrogenase:CCnc:Pt:Ser/Plas:Qn:Reaction: pyruvate to lactate: 2741 — ABNORMAL HIGH

## 2020-01-15 LAB — ZINC: Zinc:MCnc:Pt:Ser/Plas:Qn:: 0.5 — ABNORMAL LOW

## 2020-01-15 LAB — BUN / CREAT RATIO: Urea nitrogen/Creatinine:MRto:Pt:Ser/Plas:Qn:: 24

## 2020-01-15 LAB — HEMATOCRIT: Hematocrit:VFr:Pt:Bld:Qn:: 27.8 — ABNORMAL LOW

## 2020-01-15 LAB — SMOOTH MUSCLE ANTIBODY: Smooth muscle Ab:PrThr:Pt:Ser:Ord:IF: NEGATIVE

## 2020-01-15 NOTE — Unmapped (Signed)
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 *         Hyperbilirubinemia - elevated LFTs: Etiology currently unclear.  Long standing history of slowly worsening hyperbilirubinemia with marked increase over the past year. On admission, TBili 10.4, Dbili 6.6, AST 103, ALT 24, AlkPhos 285, GGT 357, mixed cholestatic/hepatic pattern. ProBNP 795, carbamazepine level WNL, phenytoin level low 6.6. Abd U/S +gallstones, but indeterminate for cholecystitis and with echogenic, nodular pattern suggestive of chronic liver disease. Echo with severely dilated LA, mod-severely dilated RA, mod pulm HTN, PASP 46. MRCP with cholelithiasis and GB wall thickening, mild dilation of CBD with distal narrowing but no definite e/o obstruction, no intrahepatic biliary duct dilation, hepatomegaly with nodule liver contour. Diff dx includes sequelae of uncontrolled SS anemia, medication effect.  (carbamezapine), less likely auto-immune. EUS/ERCP done 5/21, EUS with nl CBD, no stones, so did not progress to ERCP as obstructive pathology unlikely. HIV, Hep A, HBV ab and quant negative. HCV 1/21, negative  - Concern for congestive hepatopathy 2/2 pHTN however hepatic wedge pressure WNL so less likely.   - f/u Anti-smooth muscle, liver/kidney microsome, anti-mitochondrial, Zinc - pending  - hepatology consulting, appreciate recs. Await surg path results   - had transjugular liver biopsy 05/24; f/u results    Abdominal pain - nausea and vomiting, improving: intermittent NV 2/2 voxelotor vs liver dz  - continue Phenergan 12.5 PO q6h PRN, Zofran 4 ODT q8 PRN  - CTM Likely hepatic encephalopathy:  Asterixis noted on exam with somnolence   - Added lactulose 5/24 TID; has only had 2 doses thus far  - continue with titration to goal ~ 3 BMs daily     Seizure disorder:  - carbamazepine 800 BID and phenytoin 100 qAM + 200 qHS  - Neurology does not think related to AEDs     Hgb SS disease:   Followed by Dr. Clarene Duke in HemeOnc. No active crisis. Gets EPO and recently changed from HU to voxelotor.  - voxelotor 500 qday  - cholecalciferol 10 mcg qday    Macrocytic anemia - thrombocytopenia: Platelet 111, H/H 6.0/16.3 on admission, has been chronic for more than the past year. Macrocytic anemia likely 2/2 SS anemia and chronic hemolysis. LDH 1183, retic 6.6%. Thrombocytopenia possibly due to liver disease/splenic sequestration, hemolysis, or medication side effect.  Ferritin WNL at 173.  - folate 1 qday  - CTM    LE Edema (resolved): etiology unclear, possibly due to worsening pulm HTN or cirrhosis. ProBNP 795, but has been in similar range for past year. Albumin 2.9 on admission suggesting some contribution from low oncotic pressure.     DVT prophylaxis: Lovenox 40 mg qday    Disposition: possible discharge with HH services in next 48 hours as mental status improves       Pending labs:   Pending Labs     Order Current Status    Anti-Smooth Muscle Antibody, IgG In process    Antimitochondrial Antibody In process    Lactate dehydrogenase In  process    Liver/Kidney Microsome Type 1 Antibody In process    Surgical pathology exam In process    Zinc Level, Serum In process          Subjective:   No overnight events. No complications noted from liver biopsy. Reports doing better this morning with no NV or abdominal pain. Improving appetite. 1 BM yesterday. Verbalizes understanding of importance of lactulose. Does still feel a bit confused/slower than baseline but believes with rest at home he will get better. Would like to walk around more because he feels weak. No complaints outside of mild abdominal soreness.     Objective:   Physical Exam:    Gen: Thin male lying in bed, speaks with eyes closed  HEENT: scleral icterus present, MMM.  Heart: RRR. No appreciable murmur. Bilateral dorsal pedis and radial pulses 2+  Lungs: Unlabored breathing on RA, no use of accessory muscles.  Abdomen: Soft, NTND. No guarding. No appreciable organomegaly.  Ext: No edema. Bilateral pedal pulses +2  Neuro: awake and alert during exam. Mild asterixis  Psych: Calm, cooperative, pleasant      Vital signs in last 24 hours:  Temp:  [36.6 ??C (97.9 ??F)-37.2 ??C (99 ??F)] 36.9 ??C (98.4 ??F)  Heart Rate:  [58-65] 65  Resp:  [10-18] 16  BP: (116-165)/(65-76) 120/72  MAP (mmHg):  [87-92] 87  SpO2:  [95 %-100 %] 96 %    Intake/Output last 24 hours:    Intake/Output Summary (Last 24 hours) at 01/15/2020 1144  Last data filed at 01/15/2020 1000  Gross per 24 hour   Intake 200 ml   Output 875 ml   Net -675 ml       Medications:   Scheduled Meds:  ??? carBAMazepine  800 mg Oral BID   ??? cholecalciferol (vitamin D3-10 mcg (400 unit))  10 mcg Oral Daily   ??? enoxaparin (LOVENOX) injection  40 mg Subcutaneous Q24H   ??? folic acid  1 mg Oral Daily   ??? lactulose  20 g Oral TID   ??? phenytoin  100 mg Oral Q AM   ??? phenytoin  200 mg Oral At bedtime   ??? polyethylen glycol  17 g Oral Daily   ??? senna  1 tablet Oral BID   ??? voxelotor  500 mg Oral Daily     Continuous Infusions:   None    ======================================================================  I attest that I have reviewed the student note and that the components of the history of the present illness, the physical exam, and the assessment and plan documented were performed by me or were performed in my presence by the student where I verified the documentation and performed (or re-performed) the exam and medical decision making.     Reyes Ivan, MD [phone])  Hospitalist Physician   Clinical Assistant Professor, Department of Medicine  Jan 15, 2020 12:24 PM

## 2020-01-15 NOTE — Unmapped (Signed)
Patient updated re: plan of care.  Patient verbalized understanding.  Pt c/o nausea at start of shift.  PRN nausea meds offered but declined.  Small amount of blood and pink tinged saliva/emesis noted before patient left for VIR.  MD notified via charge RN and x ray ordered.  Patient refused most of his meds until after his procedure.  Vitals stable on return to floor from procedure.  Patient had good appetite at lunch.  Chest x ray done after patient back from VIR.  No BM noted throughout shift.  MD increased lactulose dose this afternoon.  Falls precautions in place.  Bed alarm on.  Patient resting in bed.  Waiting for dinner tray to arrive.  Will continue to monitor.     Problem: Adult Inpatient Plan of Care  Goal: Plan of Care Review  Outcome: Progressing  Goal: Patient-Specific Goal (Individualization)  Outcome: Progressing  Goal: Absence of Hospital-Acquired Illness or Injury  Outcome: Progressing  Goal: Optimal Comfort and Wellbeing  Outcome: Progressing  Goal: Readiness for Transition of Care  Outcome: Progressing  Goal: Rounds/Family Conference  Outcome: Progressing     Problem: Fall Injury Risk  Goal: Absence of Fall and Fall-Related Injury  Outcome: Progressing     Problem: Electrolyte Imbalance  Goal: Electrolyte Balance  Outcome: Progressing     Problem: Fall Injury Risk  Goal: Absence of Fall and Fall-Related Injury  Outcome: Progressing     Problem: Nausea and Vomiting  Goal: Fluid and Electrolyte Balance  Outcome: Progressing     Problem: Pain Acute  Goal: Optimal Pain Control  Outcome: Progressing     Problem: Skin Injury Risk Increased  Goal: Skin Health and Integrity  Outcome: Progressing

## 2020-01-15 NOTE — Unmapped (Signed)
Patient free from falls and injury this shift. VSS. RA. A&O x4. No complaints of pain. 24hr post fall assessment done. 1BM noted this shift with blood streaks, team was notified. Up with assist, bed alarm on for safety. Bedside commode in room. No other needs verbalized at this time. Will continue to monitor.       Problem: Adult Inpatient Plan of Care  Goal: Plan of Care Review  Outcome: Progressing  Goal: Patient-Specific Goal (Individualization)  Outcome: Progressing  Goal: Absence of Hospital-Acquired Illness or Injury  Outcome: Progressing  Goal: Optimal Comfort and Wellbeing  Outcome: Progressing  Goal: Readiness for Transition of Care  Outcome: Progressing  Goal: Rounds/Family Conference  Outcome: Progressing     Problem: Fall Injury Risk  Goal: Absence of Fall and Fall-Related Injury  Outcome: Progressing     Problem: Electrolyte Imbalance  Goal: Electrolyte Balance  Outcome: Progressing     Problem: Fall Injury Risk  Goal: Absence of Fall and Fall-Related Injury  Outcome: Progressing     Problem: Nausea and Vomiting  Goal: Fluid and Electrolyte Balance  Outcome: Progressing     Problem: Pain Acute  Goal: Optimal Pain Control  Outcome: Progressing     Problem: Skin Injury Risk Increased  Goal: Skin Health and Integrity  Outcome: Progressing

## 2020-01-15 NOTE — Unmapped (Signed)
Patient is confused to situation, refuses some medications and is agitated with staff in the room. He has difficulty with sitting up on the edge of the bed, seems unable to maintain his balance. Bed alarm is active for patient safety. Patient denies any pain or distress, last VSS. He has not had a BM today, refused AM Miralax, but took his Lactulose in AM, refusing afternoon Lactulose. MD notified.   Problem: Adult Inpatient Plan of Care  Goal: Plan of Care Review  Outcome: Ongoing - Unchanged  Goal: Patient-Specific Goal (Individualization)  Outcome: Ongoing - Unchanged  Goal: Absence of Hospital-Acquired Illness or Injury  Outcome: Ongoing - Unchanged  Goal: Optimal Comfort and Wellbeing  Outcome: Ongoing - Unchanged  Goal: Readiness for Transition of Care  Outcome: Ongoing - Unchanged  Goal: Rounds/Family Conference  Outcome: Ongoing - Unchanged     Problem: Fall Injury Risk  Goal: Absence of Fall and Fall-Related Injury  Outcome: Ongoing - Unchanged     Problem: Electrolyte Imbalance  Goal: Electrolyte Balance  Outcome: Ongoing - Unchanged     Problem: Fall Injury Risk  Goal: Absence of Fall and Fall-Related Injury  Outcome: Ongoing - Unchanged     Problem: Nausea and Vomiting  Goal: Fluid and Electrolyte Balance  Outcome: Ongoing - Unchanged     Problem: Pain Acute  Goal: Optimal Pain Control  Outcome: Ongoing - Unchanged     Problem: Skin Injury Risk Increased  Goal: Skin Health and Integrity  Outcome: Ongoing - Unchanged

## 2020-01-16 LAB — COMPREHENSIVE METABOLIC PANEL
ALBUMIN: 3.7 g/dL (ref 3.5–5.0)
ALT (SGPT): 30 U/L (ref <50)
ANION GAP: 13 mmol/L (ref 7–15)
AST (SGOT): 110 U/L — ABNORMAL HIGH (ref 19–55)
BILIRUBIN TOTAL: 11 mg/dL — ABNORMAL HIGH (ref 0.0–1.2)
BLOOD UREA NITROGEN: 16 mg/dL (ref 7–21)
BUN / CREAT RATIO: 24
CALCIUM: 9.2 mg/dL (ref 8.5–10.2)
CHLORIDE: 112 mmol/L — ABNORMAL HIGH (ref 98–107)
CO2: 18 mmol/L — ABNORMAL LOW (ref 22.0–30.0)
CREATININE: 0.66 mg/dL — ABNORMAL LOW (ref 0.70–1.30)
EGFR CKD-EPI AA MALE: >90 mL/min/{1.73_m2} (ref >=60)
EGFR CKD-EPI NON-AA MALE: >90 mL/min/{1.73_m2} (ref >=60)
GLUCOSE RANDOM: 90 mg/dL (ref 70–179)
POTASSIUM: 4.6 mmol/L (ref 3.5–5.0)
PROTEIN TOTAL: 9.1 g/dL — ABNORMAL HIGH (ref 6.5–8.3)
SODIUM: 143 mmol/L (ref 135–145)

## 2020-01-16 LAB — CALCIUM: Calcium:MCnc:Pt:Ser/Plas:Qn:: 9.2

## 2020-01-16 LAB — AMMONIA: Ammonia:SCnc:Pt:Plas:Qn:: 53 — ABNORMAL HIGH

## 2020-01-16 MED ADMIN — haloperidol lactate (HALDOL) injection 2 mg: 2 mg | INTRAVENOUS | @ 10:00:00 | Stop: 2020-01-16

## 2020-01-16 MED ADMIN — cholecalciferol (vitamin D3-10 mcg (400 unit)) tablet 10 mcg: 10 ug | ORAL | @ 12:00:00

## 2020-01-16 MED ADMIN — lactulose (CHRONULAC) oral solution (30 mL cup): 30 g | ORAL | @ 20:00:00

## 2020-01-16 MED ADMIN — folic acid (FOLVITE) tablet 1 mg: 1 mg | ORAL | @ 12:00:00

## 2020-01-16 MED ADMIN — zinc sulfate (ZINCATE) capsule 220 mg: 220 mg | ORAL | @ 19:00:00

## 2020-01-16 MED ADMIN — carBAMazepine (TEGretol  XR) 12 hr tablet 800 mg: 800 mg | ORAL | @ 12:00:00

## 2020-01-16 MED ADMIN — polyethylene glycol (MIRALAX) packet 17 g: 17 g | ORAL | @ 12:00:00

## 2020-01-16 MED ADMIN — voxelotor (OXBRYTA) tablet 500 mg **PATIENT SUPPLIED MEDICATION**: 500 mg | ORAL | @ 12:00:00

## 2020-01-16 MED ADMIN — phenytoin (DILANTIN) ER capsule 100 mg: 100 mg | ORAL | @ 12:00:00

## 2020-01-16 NOTE — Unmapped (Signed)
HEPATOLOGY INPATIENT PROGRESS NOTE     Requesting Attending Physician:  Verl Bangs, MD    Reason for Consult:    [name] [name] is a 49 y.o. male with a history of hemoglobin SS disease, pulmonary Hypertension, epilepsy on phenytoin/carbamazepine, seen in consultation at the request of Dr. Verl Bangs, MD for evaluation of hyperbilirubinemia.    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 He does have portal hypertension with Hepatic venous pressure gradient of 13 mmHg due to sinusoidal fibrosis. His liver dysfunction is therefore secondary to cardiac dysfunction in setting of pulmonary hypertension, so we recommend consulting pulmonary for management of his pulmonary hypertension.    I contact his mother via telephone to discuss Transjugular liver biopsy results at patient's request. She was not available at listed telephone number    RECOMMENDATIONS:  1. Consult pulmonology for management of pulmonary hypertension  2. We will arrange outpatient [facility] Hepatology follow up    The patient was Discussed with and seen by Dr. Pervis Hocking, MD. We will stop actively following this patient at this time. Please page hepatology fellow on call with questions.     GI Discharge Checklist:  - The GI consult service will no longer be actively following this patient at this time. We remain available for any questions that arise during the patients hospitalization. Please contact us with questions or concerns. Please reference the below guidance.  - Anticoagulation recommendations: not applicable  - Procedure follow up: No scheduled repeat endoscopy required at this time  - GI follow up: Follow up has been requested in [facility] GI clinic with Woodfin Ganja, MD. Our schedulers will call the patient to schedule  - Discharge criteria: Defer discharge timing to primary team.    Jenita Seashore, MD/PhD  [facility] Gastroenterology and Hepatology Fellow  _______________________    Relevant medications, vitals, labs and imaging were reviewed in Epic.    Physical Exam:  General appearance: no distress.  HEENT: Extraocular movement intact, Scleral icterus and Moist mucus membranes  Pulmonary: Normal work of breathing. Acyanotic  Abdominal: soft, nontender, mildly distended,    Musculoskeletal: No temporal wasting. Normal joints of the hand.  Neurologic: No asterixis, Alert, oriented, and appropriate.  Psychiatric: Appropriate.

## 2020-01-16 NOTE — Unmapped (Signed)
Patient free from falls and injury this shift. VSS. RA when awake, 2.5L when sleeping. A&O x4 with impulsive behavior and poor safety awareness. Slurred speech. 1 small BM in the past 24hrs, team aware. Refused extra dose of lactulose. It takes a lot of encouragement for patient to take medications. Bed alarm on for safety. Bedside commode in room. Urinal at bedside. Call bell and bedside table within reach. No other needs verbalized at this time. Will continue to monitor.       Problem: Adult Inpatient Plan of Care  Goal: Plan of Care Review  Outcome: Ongoing - Unchanged  Goal: Patient-Specific Goal (Individualization)  Outcome: Ongoing - Unchanged  Goal: Absence of Hospital-Acquired Illness or Injury  Outcome: Ongoing - Unchanged  Goal: Optimal Comfort and Wellbeing  Outcome: Ongoing - Unchanged  Goal: Readiness for Transition of Care  Outcome: Ongoing - Unchanged  Goal: Rounds/Family Conference  Outcome: Ongoing - Unchanged     Problem: Fall Injury Risk  Goal: Absence of Fall and Fall-Related Injury  Outcome: Ongoing - Unchanged     Problem: Electrolyte Imbalance  Goal: Electrolyte Balance  Outcome: Ongoing - Unchanged     Problem: Fall Injury Risk  Goal: Absence of Fall and Fall-Related Injury  Outcome: Ongoing - Unchanged     Problem: Nausea and Vomiting  Goal: Fluid and Electrolyte Balance  Outcome: Ongoing - Unchanged     Problem: Pain Acute  Goal: Optimal Pain Control  Outcome: Ongoing - Unchanged     Problem: Skin Injury Risk Increased  Goal: Skin Health and Integrity  Outcome: Ongoing - Unchanged

## 2020-01-16 NOTE — Unmapped (Addendum)
Pt A&Ox4, but confused at times. Poor safety awareness- bed alarm for safety. VSS. No complaints of pain. Medications given as ordered. 1 large BM so far this shift. Bed in low/locked position with call bell in reach. Will continue to monitor.    Problem: Adult Inpatient Plan of Care  Goal: Plan of Care Review  Outcome: Progressing  Goal: Patient-Specific Goal (Individualization)  Outcome: Progressing  Goal: Absence of Hospital-Acquired Illness or Injury  Outcome: Progressing  Goal: Optimal Comfort and Wellbeing  Outcome: Progressing  Goal: Readiness for Transition of Care  Outcome: Progressing  Goal: Rounds/Family Conference  Outcome: Progressing     Problem: Fall Injury Risk  Goal: Absence of Fall and Fall-Related Injury  Outcome: Progressing     Problem: Fall Injury Risk  Goal: Absence of Fall and Fall-Related Injury  Outcome: Progressing     Problem: Electrolyte Imbalance  Goal: Electrolyte Balance  Outcome: Progressing     Problem: Nausea and Vomiting  Goal: Fluid and Electrolyte Balance  Outcome: Progressing     Problem: Skin Injury Risk Increased  Goal: Skin Health and Integrity  Outcome: Progressing     Problem: Pain Acute  Goal: Optimal Pain Control  Outcome: Progressing

## 2020-01-16 NOTE — Unmapped (Addendum)
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 *         Hyperbilirubinemia - elevated LFTs: Etiology currently unclear.  Long standing history of slowly worsening hyperbilirubinemia with marked increase over the past year. On admission, TBili 10.4, Dbili 6.6, AST 103, ALT 24, AlkPhos 285, GGT 357, mixed cholestatic/hepatic pattern. ProBNP 795, carbamazepine level WNL, phenytoin level low 6.6. Abd U/S +gallstones, but indeterminate for cholecystitis and with echogenic, nodular pattern suggestive of chronic liver disease. Echo with severely dilated LA, mod-severely dilated RA, mod pulm HTN, PASP 46. MRCP with cholelithiasis and GB wall thickening, mild dilation of CBD with distal narrowing but no definite e/o obstruction, no intrahepatic biliary duct dilation, hepatomegaly with nodule liver contour. Diff dx includes sequelae of uncontrolled SS anemia, medication effect (carbamezapine), less likely auto-immune. EUS/ERCP done 5/21, EUS with nl CBD, no stones, so did not progress to ERCP as obstructive pathology unlikely. HIV, Hep A, HBV ab and quant negative. HCV 1/21, negative. AMA negative, anti-SM, liver/kidney microsome negative. Concern for congestive hepatopathy 2/2 pHTN however elevated hepatic wedge pressure more consistent with portal hypertension.  - hepatology consulting, appreciate recs.   - had transjugular liver biopsy 05/24; f/u results  - Zn low at 0.5 - will replenish    Likely hepatic encephalopathy:  Since admission has been intermittently confused, somnolent with asterixis.   - Pending Ammonia check  - Continue lactulose TID; encouraged use today but he is reluctant as dislikes stool frequency   - continue with titration to goal ~ 3 BMs daily     Abdominal pain - nausea and vomiting, improving: intermittent NV 2/2 voxelotor vs liver dz  - continue Phenergan 12.5 PO q6h PRN, Zofran 4 ODT q8 PRN  - CTM    Seizure disorder:  - carbamazepine 800 BID and phenytoin 100 qAM + 200 qHS  - Neurology does not think related to AEDs but can c/s if TJ bx suggests medication intoxication     Hgb SS disease:   Followed by Dr. Clarene Duke in HemeOnc. No active crisis. Gets EPO and recently changed from HU to voxelotor.  - voxelotor 500 qday  - cholecalciferol 10 mcg qday    Macrocytic anemia - thrombocytopenia: Platelet 111, H/H 6.0/16.3 on admission, has been chronic for more than the past year. Macrocytic anemia likely 2/2 SS anemia and chronic hemolysis. LDH 1183, retic 6.6%. Thrombocytopenia possibly due to liver disease/splenic sequestration, hemolysis, or medication side effect.  Ferritin WNL at 173.  - folate 1 qday  - CTM    LE Edema (resolved): etiology unclear, possibly due to worsening pulm HTN or cirrhosis. ProBNP 795, but has been in similar range for past year. Albumin 2.9 on admission suggesting some contribution from low oncotic pressure.     DVT prophylaxis: Lovenox 40 mg qday    Disposition: possible discharge with HH services in next 48 hours as mental status improves       Pending labs:   Pending Labs     Order Current Status  Anti-Smooth Muscle Antibody, IgG In process    Liver/Kidney Microsome Type 1 Antibody In process    Surgical pathology exam In process          Subjective:    Overnight, patient tried to get out of bed and leave the hospital. He continued to be persistently agitated, throwing objects in room at staff, requiring 2 doses haldol.     This morning, the patient was sitting in a chair fully dressed, requesting to leave the hospital. We talked extensively about the need to address his confusion in order for him to be safely discharged. He denied nausea/vomiting. Says he only had 1 BM yesterday. After much discussion, he agreed to take lactulose this morning. Will need ongoing reinforcement.     ROS  All other ROS are negative except as noted in the HPI.    Objective:    Vital signs in last 24 hours:  Temp:  [35.9 ??C (96.6 ??F)-36.1 ??C (97 ??F)] 35.9 ??C (96.6 ??F)  Heart Rate:  [59-92] 65  Resp:  [16-18] 18  BP: (135-162)/(76-90) 151/76  MAP (mmHg):  [103-118] 118  SpO2:  [97 %-99 %] 97 %    Physical Exam:  Gen: Thin male sitting up in chair, fully dressed  HEENT: scleral icterus present, MMM.  Heart: RRR. No appreciable murmur.   Lungs: Unlabored breathing on RA, no use of accessory muscles.  Abdomen: Soft, NT. Slightly distended abdomen.  Neuro: awake and alert during exam. Oriented to person, place, time but not to situation  Psych: Calm, cooperative, pleasant    Labs Reviewed:  CBC w/ diff: H/H 9.0/27.8 with macrocytosis (107.8) - improved from initial draw on admission  RTC count 12.9 - appropriate compensation  LDH 2,741 (range over last year ~ 1100-3700)  Zn low 0.5    Intake/Output last 24 hours:    Intake/Output Summary (Last 24 hours) at 01/16/2020 1244  Last data filed at 01/16/2020 0000  Gross per 24 hour   Intake ???   Output 300 ml   Net -300 ml       Medications:   Scheduled Meds:  ??? carBAMazepine  800 mg Oral BID   ??? cholecalciferol (vitamin D3-10 mcg (400 unit))  10 mcg Oral Daily   ??? enoxaparin (LOVENOX) injection  40 mg Subcutaneous Q24H   ??? folic acid  1 mg Oral Daily   ??? lactulose  30 g Oral 4x Daily   ??? phenytoin  100 mg Oral Q AM   ??? phenytoin  200 mg Oral At bedtime   ??? polyethylen glycol  17 g Oral Daily   ??? senna  1 tablet Oral BID   ??? voxelotor  500 mg Oral Daily     Continuous Infusions:   None    =======================================================================  I attest that I have reviewed the student note and that the components of the history of the present illness, the physical exam, and the assessment and plan documented were performed by me or were performed in my presence by the student where I verified the documentation and performed (or re-performed) the exam and medical decision making.     [name] Ivan, MD [phone])  Hospitalist Physician   Clinical Assistant Professor, Department of Medicine  Jan 16, 2020 1:23 PM

## 2020-01-17 LAB — LACTATE DEHYDROGENASE: Lactate dehydrogenase:CCnc:Pt:Ser/Plas:Qn:Reaction: pyruvate to lactate: 2276 — ABNORMAL HIGH

## 2020-01-17 LAB — CBC
HEMATOCRIT: 23 % — ABNORMAL LOW (ref 41.0–53.0)
HEMATOCRIT: 26.9 % — ABNORMAL LOW (ref 41.0–53.0)
HEMOGLOBIN: 7.7 g/dL — ABNORMAL LOW (ref 13.5–17.5)
HEMOGLOBIN: 8.6 g/dL — ABNORMAL LOW (ref 13.5–17.5)
MEAN CORPUSCULAR HEMOGLOBIN CONC: 33.7 g/dL (ref 31.0–37.0)
MEAN CORPUSCULAR HEMOGLOBIN CONC: 31.8 g/dL (ref 31.0–37.0)
MEAN CORPUSCULAR HEMOGLOBIN: 35.9 pg — ABNORMAL HIGH (ref 26.0–34.0)
MEAN CORPUSCULAR VOLUME: 108.6 fL — ABNORMAL HIGH (ref 80.0–100.0)
MEAN CORPUSCULAR VOLUME: 112.9 fL — ABNORMAL HIGH (ref 80.0–100.0)
MEAN PLATELET VOLUME: 11.1 fL — ABNORMAL HIGH (ref 7.0–10.0)
NUCLEATED RED BLOOD CELLS: 24 /100{WBCs} — ABNORMAL HIGH (ref <=4)
NUCLEATED RED BLOOD CELLS: 9 /100{WBCs} — ABNORMAL HIGH (ref <=4)
PLATELET COUNT: 97 10*9/L — ABNORMAL LOW (ref 150–440)
RED BLOOD CELL COUNT: 2.11 10*12/L — ABNORMAL LOW (ref 4.50–5.90)
RED BLOOD CELL COUNT: 2.39 10*12/L — ABNORMAL LOW (ref 4.50–5.90)
RED CELL DISTRIBUTION WIDTH: 21.6 % — ABNORMAL HIGH (ref 12.0–15.0)
WBC ADJUSTED: 5.2 10*9/L (ref 4.5–11.0)
WBC ADJUSTED: 7.1 10*9/L (ref 4.5–11.0)

## 2020-01-17 LAB — MEAN CORPUSCULAR HEMOGLOBIN
Erythrocyte mean corpuscular hemoglobin:EntMass:Pt:RBC:Qn:Automated count: 35.9 — ABNORMAL HIGH
Erythrocyte mean corpuscular hemoglobin:EntMass:Pt:RBC:Qn:Automated count: 36.6 — ABNORMAL HIGH

## 2020-01-17 LAB — COMPREHENSIVE METABOLIC PANEL
ALBUMIN: 3.2 g/dL — ABNORMAL LOW (ref 3.5–5.0)
ALKALINE PHOSPHATASE: 294 U/L — ABNORMAL HIGH (ref 38–126)
ALT (SGPT): 27 U/L (ref <50)
ANION GAP: 4 mmol/L — ABNORMAL LOW (ref 7–15)
BILIRUBIN TOTAL: 10.5 mg/dL — ABNORMAL HIGH (ref 0.0–1.2)
BLOOD UREA NITROGEN: 13 mg/dL (ref 7–21)
BUN / CREAT RATIO: 21
CHLORIDE: 114 mmol/L — ABNORMAL HIGH (ref 98–107)
CO2: 22 mmol/L (ref 22.0–30.0)
CREATININE: 0.62 mg/dL — ABNORMAL LOW (ref 0.70–1.30)
EGFR CKD-EPI AA MALE: >90 mL/min/{1.73_m2} (ref >=60)
EGFR CKD-EPI NON-AA MALE: >90 mL/min/{1.73_m2} (ref >=60)
GLUCOSE RANDOM: 85 mg/dL (ref 70–179)
PROTEIN TOTAL: 7.9 g/dL (ref 6.5–8.3)
SODIUM: 140 mmol/L (ref 135–145)

## 2020-01-17 LAB — ANION GAP: Anion gap 3:SCnc:Pt:Ser/Plas:Qn:: 4 — ABNORMAL LOW

## 2020-01-17 MED ADMIN — phenytoin (DILANTIN) ER capsule 200 mg: 200 mg | ORAL | @ 01:00:00

## 2020-01-17 MED ADMIN — senna (SENOKOT) tablet 1 tablet: 1 | ORAL | @ 01:00:00

## 2020-01-17 NOTE — Unmapped (Signed)
Pt A&Ox4. VSS. Free of falls. No complaints of pain. Medications given as ordered. Mental status improving, pt calls appropriately for assistance. Bed in low/locked position with call bell in reach. Will continue to monitor.     Problem: Adult Inpatient Plan of Care  Goal: Plan of Care Review  Outcome: Progressing  Goal: Patient-Specific Goal (Individualization)  Outcome: Progressing  Goal: Absence of Hospital-Acquired Illness or Injury  Outcome: Progressing  Goal: Optimal Comfort and Wellbeing  Outcome: Progressing  Goal: Readiness for Transition of Care  Outcome: Progressing  Goal: Rounds/Family Conference  Outcome: Progressing     Problem: Fall Injury Risk  Goal: Absence of Fall and Fall-Related Injury  Outcome: Progressing     Problem: Fall Injury Risk  Goal: Absence of Fall and Fall-Related Injury  Outcome: Progressing     Problem: Electrolyte Imbalance  Goal: Electrolyte Balance  Outcome: Progressing     Problem: Nausea and Vomiting  Goal: Fluid and Electrolyte Balance  Outcome: Progressing     Problem: Pain Acute  Goal: Optimal Pain Control  Outcome: Progressing     Problem: Skin Injury Risk Increased  Goal: Skin Health and Integrity  Outcome: Progressing

## 2020-01-17 NOTE — Unmapped (Incomplete)
Pulmonary Consult Service  Consult H&P      Primary Service:   Medicine - General  Primary Service Attending:  Verl Bangs, MD  Reason for Consult:   Pulmonary hypertension    ASSESSMENT and PLAN     [name] is a 49 y.o. 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 was seen, examined and discussed with Dr. Judie Petit. Graylin Shiver.  Thank you for involving [place] in his care. We will sign off at this time.  Please don't hesitate to page [place] with any questions or concerns at [phone] (pulmonary consult fellow).    Forestine Chute, MD  [facility] Pulmonary and Critical Care Fellow        HISTORY:     History of Present Illness:  [name] is a 49 y.o. male with     Past Medical History:  Past Medical History:   Diagnosis Date   ??? Anemia    ??? Foot pain    ??? 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 Hypertension    ??? Pulmonary hypertension (CMS-HCC)    ??? Seizures (CMS-HCC)    ??? Sickle cell anemia (CMS-HCC)    ??? Vitamin D deficiency      Past Surgical History:   Procedure Laterality Date   ??? PR ENDOSCOPIC US EXAM, ESOPH N/A 01/11/2020    Procedure: UGI ENDOSCOPY; WITH ENDOSCOPIC ULTRASOUND EXAMINATION LIMITED TO THE ESOPHAGUS;  Surgeon: Jules Husbands, MD;  [facility];  Service: Gastroenterology   ??? PR RIGHT HEART CATH O2 SATURATION & CARDIAC OUTPUT N/A 07/18/2017    Procedure: Right Heart Catheterization;  Surgeon: Zannie Cove, MD;  Location: [facility] CATH;  Service: Cardiology       Other History:  Family History   Problem Relation Age of Onset   ??? Hypertension Mother    ??? Hypertension Father    ??? No Known Problems Sister    ??? No Known Problems Brother    ??? No Known Problems Brother    ??? No Known Problems Brother    ??? No Known Problems Brother    ??? Sickle cell anemia Sister    ??? No Known Problems Sister    ??? Clotting disorder Paternal Grandmother    ??? No Known Problems Maternal Aunt    ??? No Known Problems Maternal Uncle    ??? No Known Problems Paternal Aunt    ??? No Known Problems Paternal Uncle    ??? No Known Problems Maternal Grandmother    ??? No Known Problems Maternal Grandfather    ??? No Known Problems Paternal Grandfather    ??? No Known Problems Other    ??? Seizures Neg Hx    ??? Pulmonary Hypertension Neg Hx    ??? Autoimmune disease Neg Hx    ??? Venous thrombosis Neg Hx    ??? Amblyopia Neg Hx    ??? Blindness Neg Hx    ??? Cancer Neg Hx    ??? Cataracts Neg Hx    ??? Diabetes Neg Hx    ??? Glaucoma Neg Hx    ??? Macular  degeneration Neg Hx    ??? Retinal detachment Neg Hx    ??? Strabismus Neg Hx    ??? Stroke Neg Hx    ??? Thyroid disease Neg Hx      Social History     Socioeconomic History   ??? Marital status: Single     Spouse name: Not on file   ??? Number of children: 0   ??? Years of education: Not on file   ??? Highest education level: Not on file   Occupational History   ??? Occupation: disability   Tobacco Use   ??? Smoking status: Current Some Day Smoker     Packs/day: 1.00     Years: 0.50     Pack years: 0.50     Types: Cigars   ??? Smokeless tobacco: Never Used   ??? Tobacco comment: Pt smokes 1-2 cigars daily, Pt intends to cease tobacco use    Vaping Use   ??? Vaping Use: Never used   Substance and Sexual Activity   ??? Alcohol use: Not Currently     Alcohol/week: 0.0 standard drinks     Comment: occasional   ??? Drug use: No   ??? Sexual activity: Not on file   Other Topics Concern   ??? Not on file   Social History Narrative   ??? Not on file     Social Determinants of Health     Financial Resource Strain: Low Risk    ??? Difficulty of Paying Living Expenses: Not very hard   Food Insecurity: No Food Insecurity   ??? Worried About Running Out of Food in the Last Year: Never true   ??? Ran Out of Food in the Last Year: Never true   Transportation Needs: No Transportation Needs   ??? Lack of Transportation (Medical): No   ??? Lack of Transportation (Non-Medical): No   Physical Activity:    ??? Days of Exercise per Week:    ??? Minutes of Exercise per Session:    Stress:    ??? Feeling of Stress :    Social Connections:    ??? Frequency of Communication with Friends and Family:    ??? Frequency of Social Gatherings with Friends and Family:    ??? Attends Religious Services:    ??? Database administrator or Organizations:    ??? Attends Banker Meetings:    ??? Marital Status:        Home Medications:  No current facility-administered medications on file prior to encounter.     Current Outpatient Medications on File Prior to Encounter   Medication Sig Dispense Refill   ??? acetaminophen (TYLENOL 8 HOUR) 650 MG CR tablet Take 650 mg by mouth every eight (8) hours as needed for pain.     ??? carBAMazepine (TEGRETOL  XR) 400 MG 12 hr tablet Take 2 tablets (800 mg total) by mouth Two (2) times a day. 120 tablet 11   ??? cholecalciferol, vitamin D3, (VITAMIN D3) 10 mcg (400 unit) cap Take 1 capsule (400 Units total) by mouth daily. 30 each 0   ??? diclofenac sodium (VOLTAREN) 1 % gel Apply 2 g topically daily as needed for arthritis. 100 g prn   ??? empty container (SHARPS CONTAINER) Misc use as directed 1 each 2   ??? epoetin alfa (PROCRIT) 40,000 unit/mL injection Inject 1 mL (40,000 Units total) under the skin Two (2) times a week. 8 mL 3   ??? folic acid (FOLVITE) 1 MG tablet Take 1  tablet (1 mg total) by mouth daily. 30 tablet 3   ??? lidocaine (LIDODERM) 5 % patch Place 1 patch on the skin.     ??? naproxen (NAPROSYN) 375 MG tablet TAKE 1 TABLET (375 MG TOTAL) BY MOUTH 2 (TWO) TIMES DAILY WITH A MEAL.     ??? oxyCODONE (ROXICODONE) 5 MG immediate release tablet Take 1 tablet (5 mg total) by mouth every eight (8) hours as needed for pain for up to 10 doses. (Patient not taking: Reported on 09/04/2019) 10 tablet 0   ??? OXYGEN-AIR DELIVERY SYSTEMS MISC 2 L/min by Each Nare route nightly. [employer]     ??? phenytoin (DILANTIN) 100 MG ER capsule Take 1 in the morning and 2 in the evening 90 capsule 5   ??? promethazine (PHENERGAN) 12.5 MG tablet Take 12.5 mg by mouth every six (6) hours as needed for nausea.     ??? syringe with needle (BD TUBERCULIN SYRINGE) 1 mL 27 x 1/2 Syrg Use as directed twice weekly 24 each 3   ??? voxelotor (OXBRYTA) 500 mg tablet Take 1 tablet (500 mg total) by mouth daily. (Patient not taking: Reported on 10/10/2019) 30 tablet 11       In-Hospital Medications:  Scheduled:  ??? carBAMazepine  800 mg Oral BID   ??? cholecalciferol (vitamin D3-10 mcg (400 unit))  10 mcg Oral Daily   ??? enoxaparin (LOVENOX) injection  40 mg Subcutaneous Q24H   ??? folic acid  1 mg Oral Daily   ??? lactulose  30 g Oral 4x Daily   ??? phenytoin  100 mg Oral Q AM   ??? phenytoin  200 mg Oral At bedtime   ??? polyethylen glycol  17 g Oral Daily   ??? senna  1 tablet Oral BID   ??? voxelotor  500 mg Oral Daily   ??? zinc sulfate  220 mg Oral BID     Continuous Infusions:    PRN Medications:  diclofenac sodium, diphenhydrAMINE, haloperidol lactate, ondansetron, oxyCODONE, promethazine    Allergies:  Allergies as of 01/09/2020   ??? (No Known Allergies)         PHYSICAL EXAM:   BP 140/68  - Pulse 68  - Temp 36.9 ??C (Oral)  - Resp 18  - Ht 172.7 cm (5' 8)  - Wt 68.8 kg (151 lb 10.8 oz)  - SpO2 92%  - BMI 23.06 kg/m??   General: Alert and oriented, no acute distress  HEENT: MMM, clear oropharynx  CV: RRR, no m/r/g  Lungs: CTAB, no increased work of breathing  Abd: Soft, NT, ND, no rebound or guarding  Ext: Warm, well perfused, no peripheral edema  Skin: No rashes  Neuro: No focal deficits    LABORATORY and RADIOLOGY DATA:     Pertinent Laboratory Data:  Lab Results   Component Value Date    WBC 5.2 01/17/2020    HGB 7.7 (L) 01/17/2020    HCT 23.0 (L) 01/17/2020    PLT 97 (L) 01/17/2020       Lab Results   Component Value Date    NA 140 01/17/2020    K 4.5 01/17/2020    CL 114 (H) 01/17/2020    CO2 22.0 01/17/2020    BUN 13 01/17/2020    CREATININE 0.62 (L) 01/17/2020    GLU 85 01/17/2020    CALCIUM 8.7 01/17/2020    MG 1.9 06/28/2019    PHOS 4.3 06/28/2019       Lab Results   Component Value  Date    BILITOT 10.5 (H) 01/17/2020    BILIDIR 6.70 (H) 01/15/2020    PROT 7.9 01/17/2020    ALBUMIN 3.2 (L) 01/17/2020    ALT 27 01/17/2020    AST 86 (H) 01/17/2020    ALKPHOS 294 (H) 01/17/2020    GGT 357 (H) 01/10/2020       Lab Results   Component Value Date    INR 1.39 01/15/2020    APTT 30.3 03/02/2018       Pertinent Micro Data:    Pertinent Imaging Data:  TTE 01/10/20  Summary    1. The left ventricular systolic function is normal, LVEF is visually  estimated at 60-65%.    2. Mitral annular calcification is present (mild).    3. There is moderate mitral valve regurgitation.    4. The left atrium is severely dilated in size.    5. The aortic valve is trileaflet with mildly thickened leaflets with normal  excursion.    6. The right ventricle is upper normal in size, with normal systolic  function.    7. There is moderate pulmonary hypertension, estimated pulmonary artery  systolic pressure is 46 mmHg.    8. The right atrium is moderately to severely dilated  in size.    9. IVC size and inspiratory change suggest mildly elevated right atrial  pressure. (5-10 mmHg).    TTE 12/08/2018  Interpretation Summary    ?? Dilated left ventricle - mild  ?? Normal left ventricular systolic function, ejection fraction > 55%  ?? Degenerative mitral valve disease  ?? Dilated left atrium - severe  ?? Dilated right ventricle - mild  ?? Normal right ventricular systolic function  ?? Dilated right atrium - mild  ?? No evidence of an interatrial communication or intrapulmonary shunt  ?? Limited study with saline contrast to exclude an interatrial communication or intrapulmonary shunt     RHC 06/2017  Conclusions:  ?? Pulmonary hypertension (mild) in the setting of a high cardiac output and high wedge pressure. PVR is normal.   ?? Mildly elevated RA and wedge pressures.  ?? Elevated cardiac output/index consistent with known severe anemia.   ?? Severe anemia, Hb 4.9 on CBC day of procedure.

## 2020-01-17 NOTE — Unmapped (Signed)
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 Last clinic visit 08/14/2020 although patient has no show appts. Disease treated with epogen 40,000 units twice a week, voxelotor 500mg  daily, stopped hydrea 06/2019 but macrocytosis persists, oxygen 2-4 liters overnight     Patient is not currently in a pain crisis, but was admitted and worked up for hyperbilirubinemia with plans for transjugular liver biopsy. Differential diagnoses for hyperbilirubinemia include chronic liver disease and initially there was concern for obstruction, but MRCP and EUS have both been unrevealing,  biopsy performed after 2 units PRBC's on 01/13/20. Developed hepatic encephalopathy with asterixis during admission but has mental status wax/wane. Treated with lactulose.     Update 01/17/2020: Preliminary bx results show congestive hepatopathy with sinusoidal fibrosis, portal hypertension 12-13 mmHG likely resultant from pulmonary HTN. Will need outpatient screening for varices.  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 These alleles are predicted to encode Partial D antigens and are associated with allo anti-D antibodies. GATA - 1 mutation is also identified. This results in loss of Fyb expression on RBCs. These individuals are not expected to make anti-Fyb but anti-Fy3 has been reported. Will continue to monitor for DHTR as LDH on admission 01/09/20 1183, 01/15/20 2741, and 01/17/20 2276 but baseline LDH appears to be ~4000 units/liter.     ?? CBC, retic, LDH daily  ?? Aggressive incentive spirometry use  ?? O2  2-4 liter [place] for naps and while sleeping  ?? Restart epogen 40,000 units twice a week  ?? Continue folic acid 1 mg daily  ?? Continue voxelotor 500mg  daily  ?? Thank you for VTE ppx and pain management as needed  ??  Will work to set up outpatient clinic appt. Will discuss need for disease management in outpatient setting. Will ask blood bank to determine ease of chronic transfusion v/s hydrea. Will need follow up with pulmonary hypertension. Will need varices screening.    Please contact me at pager [phone] for questions. If acute needs arise contact Coagulation/BenignHematology at pager [phone].    Sickle Cell attending Dr Clarene Duke was readily available to discuss exam, laboratory results, and plan as needed.    I personally spent 35 minutes on the floor or unit in direct patient care. The direct patient care time included face-to-face time with the patient, reviewing the patient's chart, communicating with the family and/or other professionals and coordinating care. Greater than 50% of this time was spent in counseling or coordinating care with the patient regarding inpatient sickle cell disease management in setting of severe anemia with hyperbilirubinemia.  Faten Frieson A Deaunte Dente, AGNP    ___________________________________________________________________    Reason for Consult:   Pt was seen at the request of Verl Bangs, MD (Med [facility])) for assistance in management of Sickle Cell Disease    HPI:  [name]. [name]  Meriel [name] is a 49 y.o. male with Sickle cell disease HbSS c/b pulmonary hypertension, proteinuria with intermittent hyperkalemia and low blood pressure in the past: Patient not a candidate for ACE inhibitor or ARB at present, hypoxia 2-4 liters overnight, seizure disorder, and erectile dysfunction. He was admitted for abdominal, lower extremity swelling and hyperbilirubinemia.     I spoke with [name] [name] in his room. I encouraged him to come to clinic. We discussed his restarting epogen. He was reluctant due to taking lovenox in hospital. However, he states he does want to feel better and is willing to restart epogen prior to discharge from hospital. Encouraged IS use. [name] [name] recognized me as someone from the Sickle cell team and working with Dr Clarene Duke. He states he has a bed alarm so he can only gett out of bed with assistance. He states he is starting to feel better.     Allergies:  No Known Allergies    Home Medications:   Prior to Admission medications    Medication Sig Start Date End Date Taking? Authorizing Provider   acetaminophen (TYLENOL 8 HOUR) 650 MG CR tablet Take 650 mg by mouth every eight (8) hours as needed for pain.   Yes Historical Provider, MD   carBAMazepine (TEGRETOL  XR) 400 MG 12 hr tablet Take 2 tablets (800 mg total) by mouth Two (2) times a day. 01/10/19  Yes Sandrea Hammond, MD   cholecalciferol, vitamin D3, (VITAMIN D3) 10 mcg (400 unit) cap Take 1 capsule (400 Units total) by mouth daily. 01/10/19  Yes Sandrea Hammond, MD   diclofenac sodium (VOLTAREN) 1 % gel Apply 2 g topically daily as needed for arthritis. 05/08/19  Yes Vimal Elease Hashimoto, MD   empty container (SHARPS CONTAINER) Misc use as directed 01/11/19   [name], CPP   epoetin alfa (PROCRIT) 40,000 unit/mL injection Inject 1 mL (40,000 Units total) under the skin Two (2) times a week. 11/22/19 11/21/20  Julious Oka, MD   folic acid (FOLVITE) 1 MG tablet Take 1 tablet (1 mg total) by mouth daily. 08/02/19   Kenyon Ana, MD   lidocaine (LIDODERM) 5 % patch Place 1 patch on the skin. 08/03/19 08/02/20  Historical Provider, MD   naproxen (NAPROSYN) 375 MG tablet TAKE 1 TABLET (375 MG TOTAL) BY MOUTH 2 (TWO) TIMES DAILY WITH A MEAL. 08/03/19   Historical Provider, MD   oxyCODONE (ROXICODONE) 5 MG immediate release tablet Take 1 tablet (5 mg total) by mouth every eight (8) hours as needed for pain for up to 10 doses.  Patient not taking: Reported on 09/04/2019 08/02/19   Kenyon Ana, MD   OXYGEN-AIR DELIVERY SYSTEMS MISC 2 L/min by Each Nare route nightly. [employer]    Historical Provider, MD   phenytoin (DILANTIN) 100 MG ER capsule Take 1 in the morning and 2 in the evening 09/05/19   Sandrea Hammond, MD   promethazine (PHENERGAN) 12.5 MG tablet Take 12.5 mg by mouth every six (6) hours as needed for nausea.    Historical Provider, MD   syringe with needle (BD TUBERCULIN SYRINGE) 1 mL 27 x 1/2 Syrg Use as directed twice weekly 09/27/19   [name], CPP   voxelotor (OXBRYTA) 500 mg tablet Take 1 tablet (500 mg total) by mouth daily.  Patient not taking: Reported on 10/10/2019 09/27/19   [name], CPP       Current Hospital Medications:  Scheduled Meds:  ??? carBAMazepine  800 mg  Oral BID   ??? cholecalciferol (vitamin D3-10 mcg (400 unit))  10 mcg Oral Daily   ??? enoxaparin (LOVENOX) injection  40 mg Subcutaneous Q24H   ??? folic acid  1 mg Oral Daily   ??? lactulose  30 g Oral 4x Daily   ??? phenytoin  100 mg Oral Q AM   ??? phenytoin  200 mg Oral At bedtime   ??? polyethylen glycol  17 g Oral Daily   ??? senna  1 tablet Oral BID   ??? voxelotor  500 mg Oral Daily   ??? zinc sulfate  220 mg Oral BID     Continuous Infusions:  PRN Meds:diclofenac sodium, diphenhydrAMINE, haloperidol lactate, ondansetron, oxyCODONE, promethazine    Medical History:  Past Medical History:   Diagnosis Date   ??? Anemia    ??? Foot pain    ??? 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 Hypertension    ??? Pulmonary hypertension (CMS-HCC)    ??? Seizures (CMS-HCC)    ??? Sickle cell anemia (CMS-HCC)    ??? Vitamin D deficiency        Surgical History:  Past Surgical History:   Procedure Laterality Date   ??? PR ENDOSCOPIC US EXAM, ESOPH N/A 01/11/2020    Procedure: UGI ENDOSCOPY; WITH ENDOSCOPIC ULTRASOUND EXAMINATION LIMITED TO THE ESOPHAGUS;  Surgeon: Jules Husbands, MD;  [facility];  Service: Gastroenterology   ??? PR RIGHT HEART CATH O2 SATURATION & CARDIAC OUTPUT N/A 07/18/2017    Procedure: Right Heart Catheterization;  Surgeon: Zannie Cove, MD;  Location[facility] CATH;  Service: Cardiology       Social History:  Social History     Socioeconomic History   ??? Marital status: Single     Spouse name: Not on file   ??? Number of children: 0   ??? Years of education: Not on file   ??? Highest education level: Not on file   Occupational History   ??? Occupation: disability   Tobacco Use   ??? Smoking status: Current Some Day Smoker     Packs/day: 1.00     Years: 0.50     Pack years: 0.50     Types: Cigars   ??? Smokeless tobacco: Never Used   ??? Tobacco comment: Pt smokes 1-2 cigars daily, Pt intends to cease tobacco use    Vaping Use   ??? Vaping Use: Never used   Substance and Sexual Activity   ??? Alcohol use: Not Currently     Alcohol/week: 0.0 standard drinks     Comment: occasional   ??? Drug use: No   ??? Sexual activity: Not on file   Other Topics Concern   ??? Not on file   Social History Narrative   ??? Not on file     Social Determinants of Health     Financial Resource Strain: Low Risk    ??? Difficulty of Paying Living Expenses: Not very hard   Food Insecurity: No Food Insecurity   ??? Worried About Running Out of Food in the Last Year: Never true   ??? Ran Out of Food in the Last Year: Never true   Transportation Needs: No Transportation Needs   ??? Lack of Transportation (Medical): No   ??? Lack of Transportation (Non-Medical): No   Physical Activity:    ??? Days of Exercise per Week:    ??? Minutes of Exercise per Session:    Stress:    ??? Feeling of Stress :  Social Connections:    ??? Frequency of Communication with Friends and Family:    ??? Frequency of Social Gatherings with Friends and Family:    ??? Attends Religious Services:    ??? Database administrator or Organizations:    ??? Attends Banker Meetings:    ??? Marital Status:      Living situation: the patient lives alone but family lives less than a mile away.    Family History:  Family History   Problem Relation Age of Onset   ??? Hypertension Mother    ??? Hypertension Father    ??? No Known Problems Sister    ??? No Known Problems Brother    ??? No Known Problems Brother    ??? No Known Problems Brother    ??? No Known Problems Brother    ??? Sickle cell anemia Sister    ??? No Known Problems Sister    ??? Clotting disorder Paternal Grandmother    ??? No Known Problems Maternal Aunt    ??? No Known Problems Maternal Uncle    ??? No Known Problems Paternal Aunt    ??? No Known Problems Paternal Uncle    ??? No Known Problems Maternal Grandmother    ??? No Known Problems Maternal Grandfather    ??? No Known Problems Paternal Grandfather    ??? No Known Problems Other    ??? Seizures Neg Hx    ??? Pulmonary Hypertension Neg Hx    ??? Autoimmune disease Neg Hx    ??? Venous thrombosis Neg Hx    ??? Amblyopia Neg Hx    ??? Blindness Neg Hx    ??? Cancer Neg Hx    ??? Cataracts Neg Hx    ??? Diabetes Neg Hx    ??? Glaucoma Neg Hx    ??? Macular degeneration Neg Hx    ??? Retinal detachment Neg Hx    ??? Strabismus Neg Hx    ??? Stroke Neg Hx    ??? Thyroid disease Neg Hx        Review of Systems:   GENERAL: No fevers, night sweats, weight gain or loss   HEENT: No runny nose, sore throat   DERM: No skin rashes, other lesions   PULM: No shortness of breath or cough   CV: No chest pain or tightness   GI: No nausea, vomiting, constipation or diarrhea   GU: No dysuria, frothy urine, bloody urine   MSK: No joint pain. Endorses lower leg swelling which is why he came to hospital  NEURO: No numbness or tingling in fingers or toes, weakness.   PSYCH: No anxiety or depression     Physical Exam:  Temp:  [35.9 ??C (96.6 ??F)-36.9 ??C (98.4 ??F)] 36.7 ??C (98.1 ??F)  Heart Rate:  [65-69] 69  Resp:  [18] 18  BP: (107-151)/(53-86) 129/62  MAP (mmHg):  [77-98] 94  SpO2:  [92 %-100 %] 96 %  Patient Vitals for the past 8 hrs:   BP Temp Temp src Pulse Resp SpO2   01/17/20 1118 129/62 36.7 ??C (98.1 ??F) Oral 69 18 96 %   01/17/20 0739 140/68 ??? ??? 68 ??? ???   01/17/20 0521 107/53 36.9 ??C (98.4 ??F) Oral 65 18 92 %     I/O this shift:  In: 240 [P.O.:240]  Out: -     General: Calm, pleasant, in no acute distress.  HEENT: Normocephalic and atraumatic, significant Scleral icterus Oral mucosa moist   Neck:  Supple  with no jugular venous distention  Cardiovascular:  Regular rate, & rhythm. S1/S2   Respiratory: Breathing is unlabored and patient is speaking full sentences with ease. Auscultation of lung fields reveals normal air movement without wheezing, ronchi or crackles.   Gastrointestinal:  Abdomen soft, round, nontender. Bowel sounds present  Musculoskeletal:  No bony pain or tenderness. No grossly-evident joint effusions or deformities. .   Extremities: Lower extremities are warm and without edema. Distal radial pulses are full and symmetric.   Skin and Subcutaneous Tissues: No rash, ecchymoses or ulcers noted. Appropriately warm and dry  Psychiatric:  Alert and oriented to person, place, time and situation. Range of affect is appropriate.   Heme/Lymphatic/Immunologic:  No lymphadenopathy in the anterior/posterior cervical basins.  Neuro: moving self some in bed, no focal deficits noted. Alert, oriented to self, situation, place    Test Results  Data Review:  I have reviewed the labs and studies from the last 24 hours.  Lab Results   Component Value Date    WBC 5.2 01/17/2020    HGB 7.7 (L) 01/17/2020    HCT 23.0 (L) 01/17/2020    PLT 97 (L) 01/17/2020     Lab Results   Component Value Date    NA 140 01/17/2020    K 4.5 01/17/2020    CL 114 (H) 01/17/2020    CO2 22.0 01/17/2020     Lab Results   Component Value Date    CALCIUM 8.7 01/17/2020    PHOS 4.3 06/28/2019     Lab Results   Component Value Date    CREATININE 0.62 (L) 01/17/2020     Lab Results   Component Value Date    ALKPHOS 294 (H) 01/17/2020    BILITOT 10.5 (H) 01/17/2020    BILIDIR 6.70 (H) 01/15/2020    PROT 7.9 01/17/2020    ALBUMIN 3.2 (L) 01/17/2020    ALT 27 01/17/2020    AST 86 (H) 01/17/2020     Lab Results   Component Value Date    PT 16.3 (H) 01/15/2020    PT 16.0 (H) 01/14/2020    PT 17.7 (H) 01/13/2020    INR 1.39 01/15/2020    INR 1.36 01/14/2020    INR 1.52 01/13/2020

## 2020-01-17 NOTE — Unmapped (Signed)
Pt has remained free from falls this shift. He has called for assistance to the bathroom as needed and has not attempted to get out of the bed unassisted. Bed alarm in use and has not activated this far this shift. Pt reminded of the falls prevention measures in place and he is in agreement. He has remained alert and oriented. He has not had any c/o nausea or any vomiting this far this shift. He has been able to turn and reposition himself in the bed. He is able to make his needs made.     Problem: Adult Inpatient Plan of Care  Goal: Plan of Care Review  Outcome: Progressing  Goal: Patient-Specific Goal (Individualization)  Outcome: Progressing  Goal: Absence of Hospital-Acquired Illness or Injury  Outcome: Progressing  Goal: Optimal Comfort and Wellbeing  Outcome: Progressing  Goal: Readiness for Transition of Care  Outcome: Progressing  Goal: Rounds/Family Conference  Outcome: Progressing     Problem: Fall Injury Risk  Goal: Absence of Fall and Fall-Related Injury  Outcome: Progressing     Problem: Electrolyte Imbalance  Goal: Electrolyte Balance  Outcome: Progressing     Problem: Fall Injury Risk  Goal: Absence of Fall and Fall-Related Injury  Outcome: Progressing     Problem: Nausea and Vomiting  Goal: Fluid and Electrolyte Balance  Outcome: Progressing     Problem: Pain Acute  Goal: Optimal Pain Control  Outcome: Progressing     Problem: Skin Injury Risk Increased  Goal: Skin Health and Integrity  Outcome: Progressing

## 2020-01-17 NOTE — Unmapped (Signed)
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 *         Congestive and Sickle Cell hepatopathy  Patient presented with long standing history of slowly worsening hyperbilirubinemia with marked increase over the past year. Initial differential was broad to include obstructive, viral, autoimmune, sequelae of SCD, sequelae of pHTN. Mixed cholestatic/hepatic pattern on admission; however, no evidence of obstructive dz on MRCP. Abdomen US demonstrated gallstones and echogenic nodular liver suggestive of chronic liver dz. Pursued aggressive work up. Viral panel (HIV, HAV, HBV ab and quant negative, HCV negative). Autoimmune work-up unremarkable (ANA, anti-SM, liver/kidney microsome negative.) Ultimately pursued TJ liver biopsy, and simultaneous measurement of hepatic wedge pressure, which was elevated consistent with portal hypertension. Preliminary read on surg path suggests congestive hepatopathy along with architectural distortion c/w either congestive or sickle cell hepatopathy. Hepatology does recommend variceal screening in the future, as an outpatient. We consulted with pulmonary, who reiterated importance of home O2 for his pHTN and recommended follow-up in their outpatient clinic, as he's already being seen by one of the attending physicians there. We also spoke with Hematology for further inpatient management recommendations (I.e., exchange transfusion +/- Ursodiol) for chronic liver disease but they agreed it was important to optimize mental status and work to improve SCD in their outpatient clinic.  - had transjugular liver biopsy 05/24; final results pending  - Optimize sickle cell mgmt as outpatient  - F/u in pulm clinic for pHTN mgmt    Hepatic encephalopathy (improving):  Since admission has been intermittently confused, somnolent with asterixis. Ammonia 53 yesterday when confused consistent with hepatic encephalopathy. Mental status improved today after 3 BMs with lactulose. No asterixis was present on exam. Cautiously optimistic about his progress, as his mental status does wax/wane. Would like to continue to monitor and ensure stability prior to discharge.  - Continue lactulose QID; titrate to goal ~ 3 BMs daily    Abdominal pain - nausea and vomiting: improved   Intermittent NV 2/2 voxelotor vs liver dz that is largely under control over past few days. Has not required phenergan or zofran for days.   - continue Phenergan 12.5 PO q6h PRN, Zofran 4 ODT q8 PRN    Seizure disorder:  We were initially concerned that medications (I.e., carbamazepine) was contributing to liver failure h/e prelim path does not suggest that.   - Continue carbamazepine 800 BID and phenytoin 100 qAM + 200 qHS    Hgb SS disease:   Followed by Dr. Clarene Duke in HemeOnc. No active crisis. Gets EPO and recently changed from HU to voxelotor.  - voxelotor 500 qday  - cholecalciferol 10 mcg qday  - Epo prior to discharge per heme     Macrocytic anemia - thrombocytopenia:   Since admission has had a Macrocytic anemia likely 2/2 SS anemia and chronic hemolysis. Did receive 2 units pRBCs prior to TJ biopsy. H/H today lower at 7.7/23 but per heme does not warrant transfusion at this time. Thrombocytopenia possibly due to liver disease/splenic sequestration, hemolysis,  or medication side effect.  - folate 1 qday  - CTM  - Epo as above    LE Edema (resolved): etiology unclear, possibly due to worsening pulm HTN or cirrhosis. ProBNP 795, but has been in similar range for past year. Albumin 2.9 on admission suggesting some contribution from low oncotic pressure.     DVT prophylaxis: Lovenox 40 mg qday    Disposition: possible discharge with HH services in next 48 hours as mental status improves       Pending labs:   Pending Labs     Order Current Status    Surgical pathology exam In process          Subjective:    No acute events overnight. No behavioral issues. Has been taking the lactulose and miralax as scheduled and had 3 documented BMs. No nausea or abdominal pain this morning. Urine output unchanged.    ROS  All other ROS are negative except as noted in the HPI.    Objective:    Vital signs in last 24 hours:  Temp:  [35.9 ??C (96.6 ??F)-36.1 ??C (97 ??F)] 35.9 ??C (96.6 ??F)  Heart Rate:  [59-92] 65  Resp:  [16-18] 18  BP: (135-162)/(76-90) 151/76  MAP (mmHg):  [103-118] 118  SpO2:  [97 %-99 %] 97 %    Physical Exam:  Gen: Thin male lying in bed, awake and appearing comfortable  HEENT: scleral icterus present, MMM.  Heart: RRR. No appreciable murmur.   Lungs: Unlabored breathing on RA, no use of accessory muscles.  Abdomen: Soft, NT, abdomen less distended.  Neuro: awake and alert during exam. Oriented to person, place, time, situation. No asterixis noted.  Psych: Calm, cooperative, pleasant    Labs Reviewed:  CBC w/ diff: H/H 7.7/23.0 with macrocytosis (108.6)  LDH 2,276  (range over last year ~ 1100-3700)  CMP: AST 86/ALT 27/Alk Phos 294  Ammonia 53    Intake/Output last 24 hours:    Intake/Output Summary (Last 24 hours) at 01/17/2020 1145  Last data filed at 01/17/2020 1118  Gross per 24 hour   Intake 240 ml   Output ???   Net 240 ml     BM x3 yesterday    Medications:   Scheduled Meds:  ??? carBAMazepine  800 mg Oral BID   ??? cholecalciferol (vitamin D3-10 mcg (400 unit))  10 mcg Oral Daily   ??? enoxaparin (LOVENOX) injection  40 mg Subcutaneous Q24H   ??? folic acid  1 mg Oral Daily   ??? lactulose  30 g Oral 4x Daily   ??? phenytoin  100 mg Oral Q AM   ??? phenytoin  200 mg Oral At bedtime   ??? polyethylen glycol  17 g Oral Daily   ??? senna  1 tablet Oral BID   ??? voxelotor  500 mg Oral Daily ??? zinc sulfate  220 mg Oral BID     Continuous Infusions:   None    =========================================================================  I attest that I have reviewed the student note and that the components of the history of the present illness, the physical exam, and the assessment and plan documented were performed by me or were performed in my presence by the student where I verified the documentation and performed (or re-performed) the exam and medical decision making.     Reyes Ivan, MD [phone])  Hospitalist Physician   Clinical Assistant Professor, Department of Medicine  Jan 17, 2020 1:08 PM

## 2020-01-18 LAB — COMPREHENSIVE METABOLIC PANEL
ALBUMIN: 3.3 g/dL — ABNORMAL LOW (ref 3.5–5.0)
ALKALINE PHOSPHATASE: 334 U/L — ABNORMAL HIGH (ref 38–126)
ALT (SGPT): 27 U/L (ref <50)
ANION GAP: 3 mmol/L — ABNORMAL LOW (ref 7–15)
AST (SGOT): 86 U/L — ABNORMAL HIGH (ref 19–55)
BILIRUBIN TOTAL: 9.6 mg/dL — ABNORMAL HIGH (ref 0.0–1.2)
BLOOD UREA NITROGEN: 10 mg/dL (ref 7–21)
BUN / CREAT RATIO: 15
CALCIUM: 8.6 mg/dL (ref 8.5–10.2)
CHLORIDE: 113 mmol/L — ABNORMAL HIGH (ref 98–107)
CO2: 22 mmol/L (ref 22.0–30.0)
EGFR CKD-EPI AA MALE: >90 mL/min/{1.73_m2} (ref >=60)
EGFR CKD-EPI NON-AA MALE: >90 mL/min/{1.73_m2} (ref >=60)
GLUCOSE RANDOM: 97 mg/dL (ref 70–179)
POTASSIUM: 4.9 mmol/L (ref 3.5–5.0)
PROTEIN TOTAL: 8.3 g/dL (ref 6.5–8.3)
SODIUM: 138 mmol/L (ref 135–145)

## 2020-01-18 LAB — CBC
HEMATOCRIT: 24 % — ABNORMAL LOW (ref 41.0–53.0)
HEMOGLOBIN: 7.8 g/dL — ABNORMAL LOW (ref 13.5–17.5)
MEAN CORPUSCULAR HEMOGLOBIN CONC: 32.5 g/dL (ref 31.0–37.0)
MEAN CORPUSCULAR HEMOGLOBIN: 34.9 pg — ABNORMAL HIGH (ref 26.0–34.0)
MEAN CORPUSCULAR VOLUME: 107.3 fL — ABNORMAL HIGH (ref 80.0–100.0)
MEAN PLATELET VOLUME: 11 fL — ABNORMAL HIGH (ref 7.0–10.0)
NUCLEATED RED BLOOD CELLS: 5 /100{WBCs} — ABNORMAL HIGH (ref <=4)
PLATELET COUNT: 94 10*9/L — ABNORMAL LOW (ref 150–440)
RED CELL DISTRIBUTION WIDTH: 20.1 % — ABNORMAL HIGH (ref 12.0–15.0)
WBC ADJUSTED: 7.3 10*9/L (ref 4.5–11.0)

## 2020-01-18 LAB — RETICULOCYTES: RETIC COUNT, MANUAL: 4.2 % — ABNORMAL HIGH (ref 0.8–2.0)

## 2020-01-18 LAB — CHLORIDE: Chloride:SCnc:Pt:Ser/Plas:Qn:: 113 — ABNORMAL HIGH

## 2020-01-18 LAB — NUCLEATED RED BLOOD CELLS: Lab: 5 — ABNORMAL HIGH

## 2020-01-18 LAB — LACTATE DEHYDROGENASE: Lactate dehydrogenase:CCnc:Pt:Ser/Plas:Qn:Reaction: pyruvate to lactate: 2317 — ABNORMAL HIGH

## 2020-01-18 LAB — RETICULOCYTE ABSOLUTE COUNT, MANUAL: Lab: 95.3

## 2020-01-18 MED ORDER — LACTULOSE 10 GRAM/15 ML ORAL SOLUTION
Freq: Three times a day (TID) | ORAL | 3 refills | 30.00000 days | Status: CP
Start: 2020-01-18 — End: 2020-05-17

## 2020-01-18 MED ORDER — ZINC SULFATE 50 MG ZINC (220 MG) CAPSULE
ORAL_CAPSULE | Freq: Two times a day (BID) | ORAL | 0 refills | 14 days | Status: CP
Start: 2020-01-18 — End: 2020-02-01

## 2020-01-18 NOTE — Unmapped (Signed)
Pt has remained free from falls this shift. He has continues to call for assistance as needed to go to the bathroom. Bed alarm remains in use. He has remained alert and oriented. No c/o nausea and no signs or c/o vomiting. Able to make his needs known.     Problem: Adult Inpatient Plan of Care  Goal: Plan of Care Review  Outcome: Progressing  Goal: Patient-Specific Goal (Individualization)  Outcome: Progressing  Goal: Absence of Hospital-Acquired Illness or Injury  Outcome: Progressing  Goal: Optimal Comfort and Wellbeing  Outcome: Progressing  Goal: Readiness for Transition of Care  Outcome: Progressing  Goal: Rounds/Family Conference  Outcome: Progressing     Problem: Fall Injury Risk  Goal: Absence of Fall and Fall-Related Injury  Outcome: Progressing     Problem: Electrolyte Imbalance  Goal: Electrolyte Balance  Outcome: Progressing     Problem: Fall Injury Risk  Goal: Absence of Fall and Fall-Related Injury  Outcome: Progressing     Problem: Nausea and Vomiting  Goal: Fluid and Electrolyte Balance  Outcome: Progressing     Problem: Pain Acute  Goal: Optimal Pain Control  Outcome: Progressing     Problem: Skin Injury Risk Increased  Goal: Skin Health and Integrity  Outcome: Progressing

## 2020-01-18 NOTE — Unmapped (Signed)
Physician Discharge Summary [facility]  3 [facility]  [address]  [place] [address]  Dept: [phone]  Loc: [phone]     Identifying Information:   [name] [name]  1970-08-28  [id number]    Primary Care Physician: Fredirick Maudlin, MD   Code Status: Full Code    Admit Date: 01/09/2020    Discharge Date: 01/18/2020     Discharge To: Home with Home Health and/or PT/OT    Discharge Service: [facility] - Hospitalist (MED H TEAM 1)     Discharge Attending Physician: Verl Bangs, MD    Discharge Diagnoses:  Principal Problem:    Hyperbilirubinemia POA: Yes  Active Problems:    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    Sickle cell anemia (CMS-HCC) POA: Yes    Vitamin D deficiency POA: Yes    Pulmonary HTN (CMS-HCC) POA: Yes    Tobacco use disorder POA: Yes    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 male with a PMHx of sickle cell anemia, pulmonary hypertension, and seizure disorder that presented to [facility] with abdominal pain, nausea and vomiting, and hyperbilirubinemia. Below are the details of his stay at [facility] organized according to problem.    Chronic liver disease  Long standing history of slowly worsening hyperbilirubinemia with marked increase over the past year. On admission, TBili 10.4, Dbili 6.6, AST 103, ALT 24, AlkPhos 285, GGT 357, mixed cholestatic/hepatic pattern. ProBNP 795, carbamazepine level WNL, phenytoin level low 6.6. Abd U/S +gallstones, but indeterminate for cholecystitis and with echogenic, nodular pattern suggestive of chronic liver disease. Echo with severely dilated LA, mod-severely dilated RA, mod pulm HTN, PASP 46. MRCP with cholelithiasis and GB wall thickening, mild dilation of CBD with distal narrowing but no definite e/o obstruction, no intrahepatic biliary duct dilation, hepatomegaly with nodule liver contour. Biliary was consulted and recommended EUS +/- ERCP which did not reveal any obstructive process.    Hepatology consulted and recommended hepatitis serologies, auto-immune workup and transjugular biopsy, performed by [facility] 01/14/20. Viral panel (HIV, HAV, HBV ab and quant negative, HCV negative). Autoimmune work-up unremarkable (ANA, anti-SM, liver/kidney microsome negative.)  Results from biopsy showed prominent congestion with mild-moderate cholestasis as well as robust perisinusoidal fibrosis without evidence of steatosis. Overall read was that these findings are consistent with exacerbation from his sickle cell disease. During the TJ biopsy, simultaneous measurement of hepatic wedge pressure demonstrated elevation consistent with portal hypertension.    There was some concern that the patient's pulmonary hypertension was contributing to his liver disease, so we consulted pulmonology, who recommended he follow-up in their outpatient clinic where he has already established care. Per their recommendations, we reiterated the importance of him using his home oxygen at night.     We also spoke with his Sickle Cell team who also saw him while he was inpatient. They recommended an EPO injection prior to discharge and will plan to follow up with him outpatient. They will also follow up on Gastroenterology's  recommendations to investigate for the presence of gastric varices.     Hepatic encephalopathy  During his course, asterixis noted on exam and patient complained of feeling sleepier than normal. Added lactulose 5/23; by 5/26 he was fully adherent with QID dosing. Mental status improved on day 2 of lactulose therapy. On discharge, he reported feeling much more clear of mind and he was discharged on TID dosing to encourage adherence.    Abdominal pain - nausea and vomiting: presented with hypogastric abdominal pain. Lipase 69. He was treated symptomatically and his diet was advanced diet as tolerated. These symptoms resolved by discharge.    Seizure disorder: he was continued on his home AEDs of carbamazepine and phenytoin during his hospitalization.    SS disease: followed by Dr. Clarene Duke in [facility]. He receives EPO twice weekly and recently changed from hydroxyurea to voxelotor. He continued his voxelotor and cholecalciferol throughout his hospitalization. Prior to discharge, he received 1 unit EPO as per their instructions.     Macrocytic anemia - thrombocytopenia: platelets 111, H/H 6.0/16.3, LDH 1183, retic 6.6% on admission, but has been chronic anemia and thrombocytopenia for more than the past year. Macrocytic anemia was thought to be likely secondary to SS anemia and chronic hemolysis. Thrombocytopenia likely due to liver disease, hemolysis, or medication side effect. His H/H and platelet count were monitored throughout his stay. On discharge, his H/H were 7.8/24 and platelets 94. We shared these findings with hematology, who recommended EPO prior to discharge. They plan to continue optimizing his SCD as an outpatient.    LE Edema (resolved): etiology unclear, possible worsening pulm HTN or hepatopathy. ProBNP 795, but has been in similar range for past year. Albumin 2.9 on admission with improvements to 3.1 and 3.8 over the subsequent days suggesting that his edema may have been secondary to liver disease and low oncotic pressure. On discharge, his albumin was 3.3 and he was without significant edema clinically.     Zinc deficiency:  Sent as part of hepatic workup. Deficient with level of 0.5mg . Replacement started with ~ 1.5g/kg/day. Prescribed at discharge for 14 days. Repeat at follow-up appointment.       Procedures: Transjugular liver biopsy 01/14/20:  -- Pathology: The overall appearance of the liver with congestion, sinusoidal fibrosis, and hepatocyte atrophy are suggestive of outflow obstruction and are likely related to/exacerbated by the patient's history of sickle cell anemia.    ______________________________________________________________________  Discharge Medications:     Your Medication List      START taking these medications    lactulose 10 gram/15 mL solution  Commonly known as: CONSTULOSE  Take 45 mL (30 g total) by mouth Three (3) times a day.     zinc sulfate 50 mg zinc (220 mg) capsule  Commonly known as: ZINCATE  Take 1 capsule (220 mg total) by mouth Two (2) times a day for 14 days.        CONTINUE taking these medications    acetaminophen 650 MG CR tablet  Commonly known as: TYLENOL 8 HOUR  Take 650 mg by mouth every eight (8) hours as needed for pain.     BD TUBERCULIN SYRINGE 1 mL 27 x 1/2 Syrg  Generic drug: syringe with needle  Use as directed twice weekly     carBAMazepine 400 MG 12 hr tablet  Commonly known as: TEGretol  XR  Take 2 tablets (800 mg total) by mouth Two (2) times a day.     diclofenac sodium 1 % gel  Commonly known as: VOLTAREN  Apply 2 g topically daily as needed for arthritis.     empty container Misc  Commonly known as: sharps container  use as directed     folic acid 1 MG tablet  Commonly known as: FOLVITE  Take 1 tablet (1 mg total) by mouth daily.     lidocaine 5 % patch  Commonly known as: LIDODERM  Place 1 patch on the skin.     naproxen 375 MG tablet  Commonly known as: NAPROSYN  TAKE 1 TABLET (375 MG TOTAL) BY MOUTH 2 (TWO) TIMES DAILY WITH A MEAL.     OXBRYTA 500 mg tablet  Generic drug: voxelotor  Take 1 tablet (500 mg total) by mouth daily.     oxyCODONE 5 MG immediate release tablet  Commonly known as: ROXICODONE  Take 1 tablet (5 mg total) by mouth every eight (8) hours as needed for pain for up to 10 doses.     OXYGEN-AIR DELIVERY SYSTEMS MISC  2 L/min by Each Nare route nightly. [employer]     phenytoin 100 MG ER capsule  Commonly known as: DILANTIN  Take 1 in the morning and 2 in the evening     PROCRIT 40,000 unit/mL injection  Generic drug: epoetin alfa  Inject 1 mL (40,000 Units total) under the skin Two (2) times a week.     promethazine 12.5 MG tablet  Commonly known as: PHENERGAN  Take 12.5 mg by mouth every six (6) hours as needed for nausea.     VITAMIN D3-10 mcg (400 unit) 10 mcg (400 unit) Cap  Generic drug: cholecalciferol (vitamin D3-10 mcg (400 unit))  Take 1 capsule (400 Units total) by mouth daily.            Allergies:  Patient has no known allergies.  ______________________________________________________________________  Pending Test Results (if blank, then none):      Most Recent Labs:  All lab results last 24 hours -   Recent Results (from the past 24 hour(s))   CBC    Collection Time: 01/17/20  1:18 PM   Result Value Ref Range    WBC 7.1 4.5 - 11.0 10*9/L    RBC 2.39 (L) 4.50 - 5.90 10*12/L    HGB 8.6 (L) 13.5 - 17.5 g/dL    HCT 16.1 (L) 09.6 - 53.0 %    MCV 112.9 (H) 80.0 - 100.0 fL    MCH 35.9 (H) 26.0 - 34.0 pg    MCHC 31.8 31.0 - 37.0 g/dL    RDW 04.5 (H) 40.9 - 15.0 %    MPV 11.1 (H) 7.0 - 10.0 fL    Platelet 102 (L) 150 - 440 10*9/L    nRBC 9 (H) <=4 /100 WBCs    Results Verified by Slide Scan Slide Reviewed    CBC    Collection Time: 01/18/20  5:00 AM   Result Value Ref Range    WBC 7.3 4.5 - 11.0 10*9/L    RBC 2.23 (L) 4.50 - 5.90 10*12/L    HGB 7.8 (L) 13.5 - 17.5 g/dL    HCT 81.1 (L) 91.4 - 53.0 %    MCV 107.3 (H) 80.0 - 100.0 fL    MCH 34.9 (H) 26.0 - 34.0 pg    MCHC 32.5 31.0 - 37.0 g/dL    RDW 78.2 (H) 95.6 - 15.0 %    MPV 11.0 (H) 7.0 - 10.0 fL    Platelet 94 (L) 150 - 440 10*9/L  nRBC 5 (H) <=4 /100 WBCs    Results Verified by Slide Scan Slide Reviewed    Lactate dehydrogenase    Collection Time: 01/18/20  5:00 AM   Result Value Ref Range    LDH 2,317 (H) 338 - 610 U/L   Reticulocytes    Collection Time: 01/18/20  5:00 AM   Result Value Ref Range    Reticulocyte Manual % 4.2 (H) 0.8 - 2.0 %    Absolute Manual Reticulocyte 95.3 27.0 - 120.0 10*9/L   Comprehensive Metabolic Panel    Collection Time: 01/18/20  5:00 AM   Result Value Ref Range    Sodium 138 135 - 145 mmol/L    Potassium 4.9 3.5 - 5.0 mmol/L    Chloride 113 (H) 98 - 107 mmol/L    Anion Gap 3 (L) 7 - 15 mmol/L    CO2 22.0 22.0 - 30.0 mmol/L    BUN 10 7 - 21 mg/dL    Creatinine 1.61 (L) 0.70 - 1.30 mg/dL    BUN/Creatinine Ratio 15     EGFR CKD-EPI Non-African American, Male >90 >=60 mL/min/1.64m2    EGFR CKD-EPI African American, Male >90 >=60 mL/min/1.53m2    Glucose 97 70 - 179 mg/dL    Calcium 8.6 8.5 - 09.6 mg/dL    Albumin 3.3 (L) 3.5 - 5.0 g/dL    Total Protein 8.3 6.5 - 8.3 g/dL    Total Bilirubin 9.6 (H) 0.0 - 1.2 mg/dL    AST 86 (H) 19 - 55 U/L    ALT 27 <50 U/L    Alkaline Phosphatase 334 (H) 38 - 126 U/L       Relevant Studies/Radiology (if blank, then none):  ECG 12 Lead    Result Date: 01/13/2020  NORMAL SINUS RHYTHM LEFT VENTRICULAR HYPERTROPHY WITH REPOLARIZATION ABNORMALITY ABNORMAL ECG WHEN COMPARED WITH ECG OF 09-Jan-2020 13:07, T WAVE FLATTENING HAS IMPROVED Confirmed by Christella Noa ([id number]) on 01/13/2020 2:14:26 PM    ECG 12 Lead    Result Date: 01/09/2020  NORMAL SINUS RHYTHM NONSPECIFIC T WAVE FLATTENING WHEN COMPARED WITH ECG OF 09-Aug-2019 19:51, T WAVE AMPLITUDE HAS DECREASED Confirmed by Lorretta Harp [phone]) on 01/09/2020 4:37:39 PM    XR Chest Portable    Result Date: 01/15/2020  EXAM: XR CHEST PORTABLE DATE: 01/14/2020 5:11 PM ACCESSION: [id number] UN DICTATED: 01/15/2020 9:29 AM INTERPRETATION LOCATION: [facility] CLINICAL INDICATION: 49 years old Male with COUGH  COMPARISON: 12/06/2018 TECHNIQUE: Portable Chest Radiograph. CONCLUSIONS: Mild cardiomegaly. The lungs are well-inflated without consolidation. No pleural effusion or pneumothorax.    MRI Abdomen Wo Contrast MRCP    Result Date: 01/11/2020  EXAM: MRI abdomen without contrast, MRCP DATE: 01/11/2020 8:19 AM ACCESSION: [id number] UN DICTATED: 01/11/2020 8:33 AM INTERPRETATION LOCATION: [facility] CLINICAL INDICATION: 49 year old male with direct hyperbilirubinemia. COMPARISON: 01/09/2020 abdominal ultrasound and priors. TECHNIQUE: MRI of the abdomen was obtained without IV contrast. Multisequence, multiplanar images and dedicated T2 weighted images that highlight the biliary tree were obtained. Of note, the noncooperative protocol was utilized for this patient, limiting further evaluation. FINDINGS: LINES/DEVICES: None. LOWER CHEST: Unremarkable. ABDOMEN: HEPATOBILIARY: The liver is enlarged with mildly nodular contour. Gallbladder is distended with dependent gallstones and pericholecystic fluid. There is mild gallbladder wall thickening. On MRCP images, no intrahepatic biliary dilatation is present. There is mild periportal edema. There is dilation of the common bile duct up to 0.9 cm with abrupt narrowing (4:24), however there is no definite evidence of obstructing lesion. PANCREAS: Unremarkable. No dilatation of the  main pancreatic duct. SPLEEN: Diffuse low signal intensity and susceptibility artifact is noted, consistent with infarcted and calcified spleen. ADRENAL GLANDS: Unremarkable. KIDNEYS/URETERS: Horseshoe kidney. Bilateral T2 hyperintense subcentimeter lesions, consistent with cysts. BOWEL/PERITONEUM/RETROPERITONEUM: No bowel obstruction. No acute inflammatory process. Small volume abdominopelvic ascites. VASCULATURE: Abdominal aorta within normal limits for patient's age. Unremarkable inferior vena cava. LYMPH NODES: No adenopathy. BONES/SOFT TISSUES: Diffusely decreased T1 marrow signal, which may be secondary to marrow replacement. H-shaped vertebral bodies.     Limited study secondary to use of noncooperative protocol. -- Cholelithiasis and gallbladder distention with mild wall thickening, which is again non-specific given underlying chronic liver disease and small volume ascites. -- Mild dilation of the common bile duct with distal narrowing, however no definite evidence of choledocholithiasis or obstructing lesion. No intrahepatic biliary ductal dilation. -- Hepatomegaly with nodular liver contour, suggestive of chronic liver disease.    US Abdomen Limited    Result Date: 01/09/2020  EXAM: US ABDOMEN LIMITED DATE: 01/09/2020 3:39 PM ACCESSION: [id number] UN DICTATED: 01/09/2020 3:39 PM INTERPRETATION LOCATION: [facility] CLINICAL INDICATION: 49 years old Male with upper abdominal pain, nausea, hx cholelithiasis  TECHNIQUE: Static and cine images of the right upper quadrant were performed. COMPARISON: Liver Doppler ultrasound dated 09/23/2018. FINDINGS: LIVER: The liver is heterogeneous with coarsened echotexture and mildly nodular contour. No focal hepatic lesions. No biliary ductal dilatation. GALLBLADDER: The gallbladder is distended and contains internal stones. Sonographic Eulah Pont sign is unable to be assessed. Small volume pericholecystic fluid is nonspecific in the setting of ascites. The gallbladder wall is mildly thickened. LIMITED RIGHT KIDNEY: No hydronephrosis. Subcentimeter right renal cyst. OTHER: Small-volume ascites in the right upper quadrant.     Findings are indeterminate for cholecystitis. Gallstones are present. There is gallbladder wall thickening and small volume pericholecystic fluid which is nonspecific in the setting of liver disease and ascites. Heterogeneous, coarse liver parenchyma suggestive of chronic liver disease. Please see below for data measurements: Liver: 18.8 cm Gallbladder wall: 0.30 cm Sonographic Murphy's Sign: patient medicated Pericholecystic fluid visualized: no Common hepatic duct: 0.45 cm Proximal common bile duct: 0.48 cm Distal common bile duct: 0.56 cm Right kidney: 9.76 cm     Echocardiogram W Colorflow Spectral Doppler    Result Date: 01/10/2020  Patient Info Name:     Micheal Likens Paxton Age:     33 years DOB:     03/21/1971 Gender:     Male MRN:     [id number] Accession #:     [id number] Ht:     173 cm Wt:     68 kg BSA:     1.80 m2 Technical Quality:     Good Exam Date:     01/10/2020 1:30 PM Site Location:     [facility] Exam Location:     [facility] Admit Date:     01/09/2020 Exam Type:     ECHOCARDIOGRAM W COLORFLOW SPECTRAL DOPPLER Study Info Indications      - eval pulm HTN Complete two-dimensional, color flow and Doppler transthoracic echocardiogram is performed. Staff Referring Physician:     SELF, REFERRED  ; Reading Fellow:     Massie Maroon MD Sonographer:     Rupert Stacks Ordering Physician:     Alphonzo Lemmings Account #:     [id number] Summary   1. The left ventricular systolic function is normal, LVEF is visually estimated at 60-65%.   2. Mitral annular calcification is present (mild).   3. There is moderate mitral valve regurgitation.   4. The  left atrium is severely dilated in size.   5. The aortic valve is trileaflet with mildly thickened leaflets with normal excursion.   6. The right ventricle is upper normal in size, with normal systolic function.   7. There is moderate pulmonary hypertension, estimated pulmonary artery systolic pressure is 46 mmHg.   8. The right atrium is moderately to severely dilated  in size.   9. IVC size and inspiratory change suggest mildly elevated right atrial pressure. (5-10 mmHg). Left Ventricle   The left ventricle is upper normal in size with normal wall thickness.   The left ventricular systolic function is normal, LVEF is visually estimated at 60-65%.   3D echocardiography imaging was performed to better assess left ventricular contractile function. The concurrent supervision requirements have been met for 3D imaging.   The left ventricular ejection fraction was quantified (3D) at 64 %.   Left ventricular diastolic function cannot be accurately assessed.   Marked apical trabeculations noted. Right Ventricle   The right ventricle is upper normal in size, with normal systolic function. Left Atrium   The left atrium is severely dilated in size. Right Atrium   The right atrium is moderately to severely dilated  in size. Aortic Valve   The aortic valve is trileaflet with mildly thickened leaflets with normal excursion.   There is no significant aortic regurgitation.   There is no evidence of a significant transvalvular gradient. Pulmonic Valve   The pulmonic valve is poorly visualized, but probably normal.   There is trivial pulmonic regurgitation.   There is no evidence of a significant transvalvular gradient. Mitral Valve   There is moderate mitral valve regurgitation.   The mitral valve leaflets are mildly thickened with normal leaflet mobility.   Mitral annular calcification is present (mild). Tricuspid Valve   The tricuspid valve leaflets are normal, with normal leaflet mobility.   There is mild tricuspid regurgitation.   There is moderate pulmonary hypertension, estimated pulmonary artery systolic pressure is 46 mmHg. Pericardium/Pleural   There is no pericardial effusion. Inferior Vena Cava   IVC size and inspiratory change suggest mildly elevated right atrial pressure. (5-10 mmHg). Aorta   The aorta is normal in size in the visualized segments. Pulmonic Valve ---------------------------------------------------------------------- Name                                 Value        Normal ---------------------------------------------------------------------- PV Doppler ---------------------------------------------------------------------- PV Peak Velocity                   1.3 m/s Mitral Valve ---------------------------------------------------------------------- Name                                 Value        Normal ---------------------------------------------------------------------- MV Regurgitation Doppler ---------------------------------------------------------------------- MR Peak Velocity                   4.2 m/s               MR VTI                              157 cm               MV Diastolic Function ---------------------------------------------------------------------- MV E Peak Velocity  128 cm/s               MV A Peak Velocity                 48 cm/s               MV E/A                                 2.7               MV Annular TDI ---------------------------------------------------------------------- MV Septal e' Velocity             9.1 cm/s         >=8.0 MV Lateral e' Velocity           13.6 cm/s        >=10.0 MV e' Average                         11.4               MV E/e' (Average)                     11.7 Tricuspid Valve ---------------------------------------------------------------------- Name                                 Value        Normal ---------------------------------------------------------------------- TV Regurgitation Doppler ---------------------------------------------------------------------- TR Peak Velocity                     3 m/s               Estimated PAP/RSVP ---------------------------------------------------------------------- RA Pressure                         8 mmHg           <=5 RV Systolic Pressure               46 mmHg           <36 Aorta ---------------------------------------------------------------------- Name                                 Value        Normal ---------------------------------------------------------------------- Ascending Aorta ---------------------------------------------------------------------- Ao Root Diameter (2D)               2.8 cm               Ao Root Diam Index (2D)         15.5 cm/m2 Venous ---------------------------------------------------------------------- Name                                 Value        Normal ---------------------------------------------------------------------- IVC/SVC ---------------------------------------------------------------------- IVC Diameter (Exp 2D)               2.4 cm         <=2.1 Aortic Valve ---------------------------------------------------------------------- Name Value        Normal ---------------------------------------------------------------------- AV Doppler ---------------------------------------------------------------------- AV Peak Velocity                   1.7 m/s Ventricles ---------------------------------------------------------------------- Name  Value        Normal ---------------------------------------------------------------------- LV Dimensions 2D/MM ---------------------------------------------------------------------- IVS Diastolic Thickness (2D)        1.0 cm       0.6-1.0 LVID Diastole (2D)                  5.6 cm       4.2-5.8 LVPW Diastolic Thickness (2D)                                1.0 cm       0.6-1.0 LVID Systole (2D)                   3.6 cm       2.5-4.0 RV Dimensions 2D/MM ---------------------------------------------------------------------- RV Basal Diastolic Dimension        4.7 cm       2.5-4.1 TAPSE                               2.9 cm         >=1.7 Atria ---------------------------------------------------------------------- Name                                 Value        Normal ---------------------------------------------------------------------- LA Dimensions ---------------------------------------------------------------------- LA Dimension (2D)                   5.0 cm       3.0-4.1 LA Volume (BP MOD)                  181 ml               LA Volume Index (BP MOD)       100.40 ml/m2   16.00-34.00 RA Dimensions ---------------------------------------------------------------------- RA Area (4C)                      26.9 cm2        <=18.0 RA Area (4C) Index             14.9 cm2/m2 QLAB ---------------------------------------------------------------------- Name                                 Value        Normal ---------------------------------------------------------------------- Heart Model ---------------------------------------------------------------------- EDV HM 211 ml               ESV HM                               75 ml               LV Length ED HM                     9.8 cm               LV Length ES HM                     8.0 cm               LV EF HM  64 %               LV SV HM                          136.0 ml               LV Mass HM                           211 g               LV CI HM                       4.60 l/min/m2 Report Signatures Finalized by Kennis Carina  MD on 01/10/2020 04:35 PM Resident Massie Maroon  MD on 01/10/2020 03:39 PM    IR Biopsy Transcatheter Approach    Result Date: 01/16/2020  EXAM: TRANSJUGULAR LIVER BIOPSY W/ INTRAVASCULAR PRESSURE MEASUREMENTS DATE: 01/14/2020 1:18 PM ACCESSION: [id number] UN DICTATED: 01/15/2020 5:44 PM INTERPRETATION LOCATION: [facility] CLINICAL INDICATION: 58 years year old Male: Elevated LFTs  CONSENT: Written informed consent was obtained after a discussion with the patient about the risks including, but not limited to, infection, bleeding, injury to the organs, and need for an additional procedure. With the patient in the supine position, the neck and upper chest were prepped and draped using all elements of maximal sterile barrier technique.  TRANSJUGULAR VEIN ACCESS: The right internal jugular vein was evaluated by ultrasound and appeared patent.  An image was saved to PACS. An appropriate skin entry site was identified and anesthetized with 1% lidocaine with epinephrine.  The vein was accessed with a 21-G needle using ultrasound guidance, followed by an 0.018 inch guidewire.  A Cope transition dilator was placed over the guidewire.  The 0.018 inch guidewire was exchanged for a 180 cm, 0.035 inch Rosen guidewire, which was advanced into the proximal IVC under fluoroscopic guidance.  Subsequently, a 10-F sheath was placed over the guidewire followed by an MPA catheter.  HEPATIC VENOUS WEDGE PRESSURE MEASUREMENTS: The catheter tip was wedged through the right hepatic vein into the liver parenchyma and confirmed by a small gentle injection of contrast.  Measurement of wedged and free hepatic vein pressures are listed: WEDGED HEPATIC VEIN PRESSURE: 28 mmHg FREE HEPATIC VEIN PRESSURE: 15 mmHg CORRECTED SINUSOIDAL PRESSURE GRADIENT: 13 mmHg.  TRANSJUGULAR LIVER BIOPSY:  Under fluoroscopic guidance, the MPA catheter was exchanged over the 0.035 inch Rosen guidewire for a Labs-100 sheath, which was passed through the 10-F sheath.  Under fluoroscopic guidance, 3 core biopsies of the liver were obtained through the Labs-100 sheath with a core biopsy needle.  The biopsies were sent to pathology in formalin.  The sheath was then removed.  Hemostasis at the right internal jugular vein access site was achieved with manual compression.  The sheath site was dressed in the usual fashion.  SEDATION: I personally spent 26 minutes, continuously monitoring the patient face-to-face during the administration of moderate sedation. Radiology nurse was present for the duration of the procedure to assist in patient monitoring. Pre and Post Sedation activities have been reviewed.     1.  Successful transjugular liver biopsy with 3 core samples using ultrasound and fluoroscopic guidance.  2.  Transjugular wedged hepatic vein pressure measurements demonstrating a corrected sinusoidal gradient of 13 mmHg, compatible with portal venous hypertension. Resident: Dr. Milana Huntsman Attending: Dr.  Alla German. The attending was present for and supervised the procedure.     XR Abdomen 1 View    Result Date: 01/10/2020  EXAM: XR ABDOMEN 1 VIEW DATE: 01/10/2020 8:16 AM ACCESSION: [id number] UN DICTATED: 01/10/2020 1:23 PM INTERPRETATION LOCATION: [facility] CLINICAL INDICATION: 49 years old Male with eval stool burden ; OTHER  COMPARISON: Abdominal radiograph on 12/11/2018 and CT on 02/02/2015 TECHNIQUE: Supine view of the abdomen. FINDINGS: Nonobstructive bowel gas pattern with stool in the colon and rectum. There is radiopaque material within the stomach. Regional soft tissue and osseous structures are intact. There are radiodensities in the gallbladder fossa, likely represent gallstones appreciated on prior cross-sectional imaging. There are also multiple pelvic phleboliths.     Nonobstructive bowel gas pattern with prominent stool burden in the colon.    ______________________________________________________________________  Discharge Instructions:   Activity Instructions     Activity as tolerated            Diet Instructions     Discharge diet (specify)      Discharge Nutrition Therapy: Sodium Restricted    Sodium Restriction Lvl: Sodium Restricted (No Added Salt)          Other Instructions     Call MD for:      Increased confusion, falls, or dizziness         Increased confusion, falls, or dizziness    Call MD for:  difficulty breathing, headache or visual disturbances      Call MD for:  extreme fatigue      Call MD for:  hives      Call MD for:  persistent dizziness or light-headedness      Call MD for:  persistent nausea or vomiting      Call MD for:  redness, tenderness, or signs of infection (pain, swelling, redness, odor or green/yellow discharge around incision site)      Call MD for:  severe uncontrolled pain      Call MD for: Temperature > 38.5 Celsius ( > 101.3 Fahrenheit)      Discharge instructions      We are sending you a referral to your pulmonologist. Hematology will call you to follow-up in their Sickle Cell clinic with Dr. Clarene Duke. You can take extra lactulose if you have not had at least 3 bowel movements a day.         We are sending you a referral to your pulmonologist. Hematology will call you to follow-up in their Sickle Cell clinic with Dr. Clarene Duke. You can take extra lactulose if you have not had at least 3 bowel movements a day.    Remove dressing in 24 hours            Follow Up instructions and Outpatient Referrals     Ambulatory referral to Home Health      Is this a [facility] or [facility] Patient?: No    Physician to follow patient's care: PCP    Disciplines requested:  Nursing  Physical Therapy       Nursing requested: Teaching/skilled observation and assessment    What teaching is needed (new diagnosis? new medications?): Medication management; Discharge medication reconciliation; Disease process teaching encephalopathy    Physical Therapy requested:  Strengthening exercises  Evaluate and treat       Requested start of care date: Other: (please enter in comments) Comment - 01/22/20 or within 48 hours of discharge    I certify that [name] [name] is confined to  his/her home and needs intermittent skilled nursing care, physical therapy and/or speech therapy or continues to need occupational therapy. The patient is under my care, and I have authorized services on this plan of care and will periodically review the plan. The patient had a face-to-face encounter with an allowed provider type on 01/18/20 and the encounter was related to the primary reason for home health care.    Ambulatory referral to Pulmonology      Do you want ongoing co-management?: Yes    Care coordination required?: No    Previously followed in clinic for pulmonary hypertension.     Patient has not been using his home O2 because he says the machine is too loud. Please help coordinate a new machine for him if possible.    Call MD for:      Increased confusion, falls, or dizziness    Call MD for:  difficulty breathing, headache or visual disturbances      Call MD for:  extreme fatigue      Call MD for:  hives      Call MD for:  persistent dizziness or light-headedness      Call MD for:  persistent nausea or vomiting      Call MD for:  redness, tenderness, or signs of infection (pain, swelling, redness, odor or green/yellow discharge around incision site)      Call MD for:  severe uncontrolled pain      Call MD for: Temperature > 38.5 Celsius ( > 101.3 Fahrenheit)      Discharge instructions      We are sending you a referral to your pulmonologist. Hematology will call you to follow-up in their Sickle Cell clinic with Dr. Clarene Duke. You can take extra lactulose if you have not had at least 3 bowel movements a day.          Appointments which have been scheduled for you    Feb 20, 2020 12:00 PM  (Arrive by 11:45 AM)  RETURN  SICKLE CELL with Julious Oka, MD  [facility]IGN [facility]ATOLOGY CLINIC [place] [facility]) [address]  [place] [address]  [phone]      Mar 06, 2020  8:20 AM  (Arrive by 8:05 AM)  RETURN  HEPATOLOGY with Annie Paras, MD  [facility] GI MEDICINE [place] [facility]) [address]  [place] [address]  [phone]      Mar 21, 2020 12:30 PM  (Arrive by 12:15 PM)  RETURN  PULM HYPERTENSION with Zannie Cove, MD  [facility] PULMONARY SPECIALTY CL [place] [facility]) [address]  [place] [address]  [phone]           ______________________________________________________________________  Discharge Day Services:  BP 122/65  - Pulse 65  - Temp 36.8 ??C (98.2 ??F) (Oral)  - Resp 18  - Ht 172.7 cm (5' 8)  - Wt 68.8 kg (151 lb 10.8 oz)  - SpO2 96%  - BMI 23.06 kg/m??   Pt seen on the day of discharge and determined appropriate for discharge.    Condition at Discharge: fair    Length of Discharge: I spent greater than 30 mins in the discharge of this patient.

## 2020-01-19 DIAGNOSIS — Q447 Other congenital malformations of liver: Principal | ICD-10-CM

## 2020-01-22 NOTE — Unmapped (Signed)
COMMUNITY HEALTH WORKER  Outreach Note    01/22/2020    Situation:    Referral received from: Other: RN  Reason for referral: Transportation  Background:    After speaking with patient, they state the following history about their needs:  Patient need transportation to medical appointment  Assessment:    CHW discussed the following topics with patient:   Transportation  Recommendations:  CHW provided patient with Medicaid transportation contact.  Additional Comments/Actions:  None  Note Routed: No.  Routed to:N/A    Earnestine Leys

## 2020-01-22 NOTE — Unmapped (Signed)
Called to check in. No answer. Asked him to call.

## 2020-01-23 NOTE — Unmapped (Signed)
[facility] Refill Coordination Note    Specialty Medication(s) to be Shipped:   Hematology/Oncology: [place] Phlegm and Procrit    Other medication(s) to be shipped: n/a- **pt denied syringes     [name] [name], DOB: October 06, 1970  Phone: [phone] (home)       All above HIPAA information was verified with patient.     Was a Nurse, learning disability used for this call? No    Completed refill call assessment today to schedule patient's medication shipment from the [facility] [phone]).       Specialty medication(s) and dose(s) confirmed: Regimen is correct and unchanged.   Changes to medications: [name] reports no changes at this time.  Changes to insurance: No  Questions for the pharmacist: No    Confirmed patient received Welcome Packet with first shipment. The patient will receive a drug information handout for each medication shipped and additional FDA Medication Guides as required.       DISEASE/MEDICATION-SPECIFIC INFORMATION        For patients on injectable medications: Patient currently has 2 doses left.  Next injection is scheduled for 6/3.    SPECIALTY MEDICATION ADHERENCE     Medication Adherence    Patient reported X missed doses in the last month: 0  Specialty Medication: procrit 40,000 unit/ml                oxbryta 500mg   : 5 days of medicine on hand         SHIPPING     Shipping address confirmed in Epic.     Delivery Scheduled: Yes, Expected medication delivery date: 6/4.     Medication will be delivered via Same Day Courier to the prescription address in Epic WAM.    Westley Gambles   [facility] Specialty Technician

## 2020-01-23 NOTE — Unmapped (Signed)
pt called left this # for [phone] ext. [phone] in reference to paperwork needed, pt didn't know what type of paperwork

## 2020-01-24 MED ORDER — FOLIC ACID 1 MG TABLET
ORAL_TABLET | 11 refills | 0 days | Status: CP
Start: 2020-01-24 — End: ?

## 2020-01-24 MED ORDER — CARBAMAZEPINE ER 400 MG TABLET,EXTENDED RELEASE,12 HR
ORAL_TABLET | Freq: Two times a day (BID) | ORAL | 11 refills | 30.00000 days | Status: CP
Start: 2020-01-24 — End: ?

## 2020-01-24 MED ORDER — PHENYTOIN SODIUM EXTENDED 100 MG CAPSULE
ORAL_CAPSULE | 11 refills | 0 days | Status: CP
Start: 2020-01-24 — End: ?

## 2020-01-24 NOTE — Unmapped (Signed)
Reached out to phone number. This was company needing signed CMS paperwork returned for O2. Has been printed signed and faxed.

## 2020-01-25 ENCOUNTER — Ambulatory Visit
Admit: 2020-01-25 | Discharge: 2020-01-28 | Disposition: A | Discharge status: Home / Self Care | Payer: MEDICARE | Admitting: Student in an Organized Health Care Education/Training Program

## 2020-01-25 LAB — INR: Coagulation tissue factor induced.INR:RelTime:Pt:PPP:Qn:Coag: 1.6

## 2020-01-25 LAB — TROPONIN I: Troponin I.cardiac:MCnc:Pt:Ser/Plas:Qn:: 0.16

## 2020-01-25 LAB — MANUAL DIFFERENTIAL
BASOPHILS - ABS (DIFF): 0.1 10*9/L (ref 0.0–0.1)
BASOPHILS - REL (DIFF): 2 %
EOSINOPHILS - REL (DIFF): 0 %
LYMPHOCYTES - ABS (DIFF): 2.3 10*9/L (ref 1.5–5.0)
LYMPHOCYTES - REL (DIFF): 61 %
MONOCYTES - ABS (DIFF): 0.3 10*9/L (ref 0.2–0.8)
NEUTROPHILS - ABS (DIFF): 1 10*9/L — ABNORMAL LOW (ref 2.0–7.5)
NEUTROPHILS - REL (DIFF): 28 %

## 2020-01-25 LAB — CBC W/ AUTO DIFF
HEMOGLOBIN: 7.7 g/dL — ABNORMAL LOW (ref 13.5–17.5)
MEAN CORPUSCULAR HEMOGLOBIN CONC: 34.5 g/dL (ref 30.0–36.0)
MEAN CORPUSCULAR HEMOGLOBIN: 36.4 pg — ABNORMAL HIGH (ref 26.0–34.0)
MEAN CORPUSCULAR VOLUME: 105.4 fL — ABNORMAL HIGH (ref 81.0–95.0)
MEAN PLATELET VOLUME: 8.7 fL (ref 7.0–10.0)
NUCLEATED RED BLOOD CELLS: 80 /100{WBCs} — ABNORMAL HIGH (ref <=4)
PLATELET COUNT: 106 10*9/L — ABNORMAL LOW (ref 150–450)
RED CELL DISTRIBUTION WIDTH: 24.5 % — ABNORMAL HIGH (ref 12.0–15.0)

## 2020-01-25 LAB — COMPREHENSIVE METABOLIC PANEL
ALBUMIN: 2.6 g/dL — ABNORMAL LOW (ref 3.4–5.0)
ALKALINE PHOSPHATASE: 295 U/L — ABNORMAL HIGH (ref 46–116)
ALT (SGPT): 27 U/L (ref 10–49)
AST (SGOT): 78 U/L — ABNORMAL HIGH (ref <34)
BILIRUBIN TOTAL: 11.1 mg/dL — ABNORMAL HIGH (ref 0.3–1.2)
BLOOD UREA NITROGEN: 14 mg/dL (ref 9–23)
BUN / CREAT RATIO: 20
CALCIUM: 8.3 mg/dL — ABNORMAL LOW (ref 8.7–10.4)
CHLORIDE: 107 mmol/L (ref 98–107)
CREATININE: 0.69 mg/dL (ref 0.60–1.10)
EGFR CKD-EPI AA MALE: >90 mL/min/{1.73_m2}
EGFR CKD-EPI NON-AA MALE: >90 mL/min/{1.73_m2}
GLUCOSE RANDOM: 117 mg/dL (ref 70–179)
POTASSIUM: 4 mmol/L (ref 3.5–5.1)
PROTEIN TOTAL: 7.8 g/dL (ref 5.7–8.2)
SODIUM: 136 mmol/L (ref 135–145)

## 2020-01-25 LAB — BILIRUBIN DIRECT: Bilirubin.glucuronidated+Bilirubin.albumin bound:MCnc:Pt:Ser/Plas:Qn:: 7.8 — ABNORMAL HIGH

## 2020-01-25 LAB — D-DIMER QUANTITATIVE (CH,ML,PD,ET): Fibrin D-dimer DDU:MCnc:Pt:PPP:Qn:: 2395 — ABNORMAL HIGH

## 2020-01-25 LAB — SODIUM: Sodium:SCnc:Pt:Ser/Plas:Qn:: 136

## 2020-01-25 LAB — AMMONIA: Ammonia:SCnc:Pt:Plas:Qn:: 36 — ABNORMAL HIGH

## 2020-01-25 LAB — LACTATE DEHYDROGENASE: Lactate dehydrogenase:CCnc:Pt:Ser/Plas:Qn:: 890 — ABNORMAL HIGH

## 2020-01-25 LAB — MEAN CORPUSCULAR HEMOGLOBIN: Erythrocyte mean corpuscular hemoglobin:EntMass:Pt:RBC:Qn:Automated count: 36.4 — ABNORMAL HIGH

## 2020-01-25 LAB — SMEAR REVIEW

## 2020-01-25 LAB — LIPASE: Triacylglycerol lipase:CCnc:Pt:Ser/Plas:Qn:: 83 — ABNORMAL HIGH

## 2020-01-25 MED FILL — OXBRYTA 500 MG TABLET: ORAL | 30 days supply | Qty: 30 | Fill #2

## 2020-01-25 MED FILL — OXBRYTA 500 MG TABLET: 30 days supply | Qty: 30 | Fill #2 | Status: AC

## 2020-01-25 MED FILL — PROCRIT 40,000 UNIT/ML INJECTION SOLUTION: 28 days supply | Qty: 8 | Fill #2 | Status: AC

## 2020-01-25 MED FILL — PROCRIT 40,000 UNIT/ML INJECTION SOLUTION: SUBCUTANEOUS | 28 days supply | Qty: 8 | Fill #2

## 2020-01-25 NOTE — Unmapped (Signed)
Sickle Cell Phone Note    Call: incoming from physical therapist at [name] [name] house, I spoke with [name] [name] mother as well due to [name] [name] moaning in background and unable to talk    Subjective/Objective:  [name] [name] is a 49 y.o. 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 Patient has hypoxia 2-4 liters overnight, seizure disorder, and erectile dysfunction. Treatment for sickle cell disease currently includes voxelotor.     He was admitted for abdominal, lower extremity swelling and hyperbilirubinemia 5/19-5/28/202 where he received an extensive hepatology workup consisting of MRCP, and liver bx. Overall findings from hepatology was consistent with exacerbation from his sickle cell disease. During the TJ biopsy, simultaneous measurement of hepatic wedge pressure demonstrated elevation consistent with portal hypertension. He canceled his follow up hospitalization appt with Sickle cell clinic on 01/16/2020 and rescheduled for end of June 2021. Received 2 units PRBC's on 01/13/2020.     I received a call from physical therapist who is concerned with patient condition. She states [name] [name] endorses taking lactulose three times a day but has not had BM since Tuesday. He endorses passing gas once today but otherwise can not remember any other time he passed gas since last BM. His abd is distended and patient is moaning in pain. Pain is in Abd LUQ to flank. Legs are also edematous. PT feels mental status is somewhat clear but unable to know for sure due to pain/ moaning.  Scratch noted on left flank.     Assessment/Plan:  Sickle Cell disease HbSS: abdominal pain, distention, hepatopathy treated with lactulose and no BM 3 days  ?? GO to Emergency room for physical exam, labs, and imaging if warranted. (Physical therapist states she is calling 911 due to patient pain level but mother states she will transport patient to ED)  ?? Would check DAT due to hx of RBC antibodies and transfusion 12 days ago unless low clinical concern for DHTR.   ?? Call for any questions or concerns.   ?? Continue Voxelotor if admitted

## 2020-01-26 LAB — RETIC COUNT, MANUAL: Lab: 9.6 — ABNORMAL HIGH

## 2020-01-26 LAB — TROPONIN I: Troponin I.cardiac:MCnc:Pt:Ser/Plas:Qn:: 0.08

## 2020-01-26 LAB — PRO-BNP: Natriuretic peptide.B prohormone N-Terminal:MCnc:Pt:Ser/Plas:Qn:: 271 — ABNORMAL HIGH

## 2020-01-26 LAB — HEPARIN CORRELATION: Lab: 0.5

## 2020-01-26 MED ADMIN — aspirin tablet 325 mg: 325 mg | ORAL | @ 01:00:00 | Stop: 2020-01-25

## 2020-01-26 MED ADMIN — azithromycin (ZITHROMAX) tablet 500 mg: 500 mg | ORAL | @ 05:00:00 | Stop: 2020-01-26

## 2020-01-26 MED ADMIN — zinc sulfate (ZINCATE) capsule 220 mg: 220 mg | ORAL | @ 13:00:00

## 2020-01-26 MED ADMIN — iohexoL (OMNIPAQUE) 350 mg iodine/mL solution 75 mL: 75 mL | INTRAVENOUS | @ 18:00:00 | Stop: 2020-01-26

## 2020-01-26 MED ADMIN — folic acid (FOLVITE) tablet 1,000 mcg: 1000 ug | ORAL | @ 13:00:00

## 2020-01-26 MED ADMIN — lidocaine (LIDODERM) 5 % patch 1 patch: 1 | TRANSDERMAL | @ 01:00:00

## 2020-01-26 MED ADMIN — MORPhine injection 6 mg: 6 mg | INTRAVENOUS | @ 01:00:00 | Stop: 2020-01-25

## 2020-01-26 MED ADMIN — cholecalciferol (vitamin D3-10 mcg (400 unit)) tablet 10 mcg: 10 ug | ORAL | @ 13:00:00

## 2020-01-26 MED ADMIN — heparin 25,000 Units/250 mL (100 units/mL) in 0.45% saline infusion (premade): 18 [IU]/kg/h | INTRAVENOUS | @ 06:00:00 | Stop: 2020-01-26

## 2020-01-26 MED ADMIN — lactulose (CHRONULAC) oral solution (30 mL cup): 30 g | ORAL | @ 19:00:00

## 2020-01-26 MED ADMIN — carBAMazepine (TEGretol  XR) 12 hr tablet 800 mg: 800 mg | ORAL | @ 13:00:00

## 2020-01-26 MED ADMIN — heparin (porcine) 1,000 unit/mL 1000 unit/mL injection 5,500 Units: 80 [IU]/kg | INTRAVENOUS | @ 06:00:00 | Stop: 2020-01-26

## 2020-01-26 NOTE — Unmapped (Signed)
Heparin dose infusion verified with [facility], Charity fundraiser

## 2020-01-26 NOTE — Unmapped (Addendum)
[facility] Medicine   History and Physical    Assessment/Plan:    Principal Problem:    Chest pain  Active Problems:    Sickle cell anemia (CMS-HCC)    Seizure disorder (CMS-HCC)    Pulmonary HTN (CMS-HCC)      Arville Lime is a 49 y.o. male with PMHx as noted below who presents to [facility] with Chest pain.    Chest pain: Patient presents with left anterior chest pain following ~10-day hospitalization. Patient's pain is reproducible with chest wall palpation, however D-dimer is markedly elevated with associated troponin elevation 0.180 & pleuritic component. In this setting will pursue CTA to rule-out PE. Other considerations with CXR findings include acute chest, though patient lacks fever, tachypnea, hypoxia, with LDH improved from prior. No ischemic ECG changes to suggest acute coronary syndrome.   - s/p ASA 325mg  PO in ED  - Monitor telemetry   - CTA ordered to r/o PE with markedly elevated D-dimer   ADDENDUM: ED unable to obtain sufficient PIV access for contrasted study   - Will start empiric heparin gtt for presumptive PE   - Discuss with Vascular access team in AM. If unsuccessful consider V/Q scan   - Empirically cover with ceftriaxone & azithromycin for acute chest coverage with CXR findings & sickle cell history     Pulmonary hypertension: Group 5 secondary to sickle cell disease +/- Group 2 from left-sided heart failure in setting of high-output heart failure from severe anemia . Patient endorses intermittent compliance with nightly home O2.  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 Viral panel (HIV, HAV, HBV ab and quant negative, HCV negative). 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 Evaluating for acute chest as above. Patient denies prior history of acute chest syndrome. Aside from left anterior chest pain, he denies other pain usually experienced during prior crises.   - Home voxelotor 500 mg daily    Seizure disorder: No recent seizures.   - Continue carbamazepine 800 BID and phenytoin 100 qAM + 200 qHS    Macrocytic anemia - thrombocytopenia: Chronic. As previously postulated, macrocytic anemia was thought to be likely secondary to SS anemia and chronic hemolysis. Thrombocytopenia likely due to liver disease, hemolysis, or medication side effect.  - CTM   - Continue home EPO 40,000 units twice weekly         Daily Checklist:  Diet: Regular  DVT ppx: AC as above  Bowel regimen: Miralax PRN  Dispo: Admit to  Med H, floor status    Code Status:  Full Code     Floor time 75 minutes minutes, > 50% spent in counseling and coordination of care about the following issues:  review of prior medical records including labs & imaging, review of current labs/imaging, coordination of care with ED providers, discussions with patient regarding plan of care.      ___________________________________________________________________    Chief Complaint  Chief Complaint   Patient presents with   ??? Abdominal Pain       HPI:  Arville Lime is a 49 y.o.  male with PMHx as noted below who presents to [facility] with left chest pain.    Patient reports left-sided anterior chest pain occurring within 24 hours prior to ED presentation. He states pain came on suddenly, described as a sharp stabbing sensation overlying his rib cage. He feels some discomfort with deep breaths, but otherwise has denied fevers, cough, shortness of breath, DOE. He does endorse orthopnea, having to sleep on the couch/recliner for the past 1 week. Over this 1 week period he additionally notes worsening abdominal distension and lower extremity edema. Due to concerns over his abdominal distension and left chest pain, he called the Hematology clinic who advised patient transport to the ED for further evaluation.    Since arrival to the [facility] ED, patient has been afebrile, nontachycardic, normotensive, saturating 100% on room air.  Initial laboratory evaluation was remarkable for hemoglobin 7.7 (at baseline), normal WBC 6.0, bicarb 18, total bilirubin 11.1 (up from 9.6 on 5/28), AST 78, ALT 27, alk phos 295, LDH 890 (down from 2317 previously).  Additionally troponin was found to be elevated 0.160 without concerning ischemic ECG changes.     While in the emergency department, patient received morphine 6 mg IV x1, aspirin 325 mg p.o. x1.     Admission was requested to hospital medicine service for evaluation of patient's chest pain with associated elevated troponin.      Of note, patiently was recently admitted to the hospital medicine service 5/19???5/28 at [facility] in setting of abdominal pain with associated nausea vomiting hyperbilirubinemia.  He underwent extensive hepatology evaluation in the form of MRCP (showing no evidence of obstructive biliary pathology), viral/autoimmune serologies (all negative), with subsequent IR transjugular liver biopsy with diagnosis suggestive of congestive hepatopathy with background of sickle cell hepatopathy.  Hepatic wedge pressure performed during procedure was consistent with portal hypertension.  During this admission, he required lactulose for treatment of hepatic encephalopathy.  Hematology was consulted at that time who made recommendations regarding EPO dosing.  Pulmonology was consulted due to concerns of patient's pulmonary hypertension contributing to his congestive hepatopathy, with recommendations provided including strict adherence to home ambulatory supplemental oxygen at night.        Allergies:  Patient has no known allergies. Medications:   Prior to Admission medications    Medication Dose, Route, Frequency   acetaminophen (TYLENOL 8 HOUR) 650 MG CR tablet 650 mg, Oral, Every 8 hours PRN   carBAMazepine (TEGRETOL  XR) 400 MG 12 hr tablet 800 mg, Oral, 2 times a day (standard)   cholecalciferol, vitamin D3, (VITAMIN D3) 10 mcg (400 unit) cap 400 Units, Oral, Daily   diclofenac sodium (VOLTAREN) 1 % gel 2 g, Topical, Daily PRN   empty container (SHARPS CONTAINER) Misc use as directed   epoetin alfa (PROCRIT) 40,000 unit/mL injection 40,000 Units, Subcutaneous, 2 times a week   folic acid (FOLVITE) 1 MG tablet TAKE 1 TABLET BY MOUTH EVERY DAY   lactulose (CONSTULOSE) 10 gram/15 mL solution 30 g, Oral, 3 times a day (standard)   lidocaine (LIDODERM) 5 % patch 1 patch, Transdermal   naproxen (NAPROSYN) 375 MG tablet TAKE 1 TABLET (375 MG TOTAL) BY MOUTH 2 (TWO) TIMES DAILY WITH A MEAL.   oxyCODONE (ROXICODONE) 5 MG immediate release tablet 5 mg, Oral, Every 8 hours PRN  Patient not taking: Reported on 09/04/2019   OXYGEN-AIR DELIVERY SYSTEMS MISC 2 L/min, Each Nare, Nightly, [employer]   phenytoin (DILANTIN) 100 MG ER capsule Take 1 capsule in the morning and  2 capsules in the evening   promethazine (PHENERGAN) 12.5 MG tablet 12.5 mg, Oral, Every 6 hours PRN   syringe with needle (BD TUBERCULIN SYRINGE) 1 mL 27 x 1/2 Syrg Use as directed twice weekly   voxelotor (OXBRYTA) 500 mg tablet 500 mg, Oral, Daily (standard)  Patient not taking: Reported on 10/10/2019   zinc sulfate (ZINCATE) 50 mg zinc (220 mg) capsule 220 mg, Oral, 2 times a day (standard)       Medical History:  Past Medical History:   Diagnosis Date   ??? Anemia    ??? Foot pain    ??? 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 Hypertension    ??? Pulmonary hypertension (CMS-HCC)    ??? Seizures (CMS-HCC)    ??? Sickle cell anemia (CMS-HCC)    ??? Vitamin D deficiency        Surgical History:  Past Surgical History:   Procedure Laterality Date   ??? PR ENDOSCOPIC US EXAM, ESOPH N/A 01/11/2020 Procedure: UGI ENDOSCOPY; WITH ENDOSCOPIC ULTRASOUND EXAMINATION LIMITED TO THE ESOPHAGUS;  Surgeon: Jules Husbands, MD;  [facility];  Service: Gastroenterology   ??? PR RIGHT HEART CATH O2 SATURATION & CARDIAC OUTPUT N/A 07/18/2017    Procedure: Right Heart Catheterization;  Surgeon: Zannie Cove, MD;  Location: [facility] CATH;  Service: Cardiology       Social History:  Social History     Social History Narrative   ??? Not on file     Social History     Tobacco Use   ??? Smoking status: Current Some Day Smoker     Packs/day: 1.00     Years: 0.50     Pack years: 0.50     Types: Cigars   ??? Smokeless tobacco: Never Used   ??? Tobacco comment: Pt smokes 1-2 cigars daily, Pt intends to cease tobacco use    Vaping Use   ??? Vaping Use: Never used   Substance Use Topics   ??? Alcohol use: Not Currently     Alcohol/week: 0.0 standard drinks     Comment: occasional   ??? Drug use: No       Family History:  Family History   Problem Relation Age of Onset   ??? Hypertension Mother    ??? Hypertension Father    ??? No Known Problems Sister    ??? No Known Problems Brother    ??? No Known Problems Brother    ??? No Known Problems Brother    ??? No Known Problems Brother    ??? Sickle cell anemia Sister    ??? No Known Problems Sister    ??? Clotting disorder Paternal Grandmother    ??? No Known Problems Maternal Aunt    ??? No Known Problems Maternal Uncle    ??? No Known Problems Paternal Aunt    ??? No Known Problems Paternal Uncle    ??? No Known Problems Maternal Grandmother    ??? No Known Problems Maternal Grandfather    ??? No Known Problems Paternal Grandfather    ??? No Known Problems Other    ??? Seizures Neg Hx    ??? Pulmonary Hypertension Neg Hx    ??? Autoimmune disease Neg Hx    ??? Venous thrombosis Neg Hx    ??? Amblyopia Neg Hx    ??? Blindness Neg Hx    ??? Cancer Neg Hx    ??? Cataracts Neg Hx    ??? Diabetes Neg Hx    ???  Glaucoma Neg Hx    ??? Macular degeneration Neg Hx    ??? Retinal detachment Neg Hx    ??? Strabismus Neg Hx    ??? Stroke Neg Hx    ??? Thyroid disease Neg Hx        Review of Systems:  10 systems reviewed and are negative unless otherwise mentioned in HPI      Physical Exam:  Temp:  [36.9 ??C (98.4 ??F)] 36.9 ??C (98.4 ??F)  Heart Rate:  [63-70] 69  SpO2 Pulse:  [63-69] 69  Resp:  [14-18] 16  BP: (114-129)/(62-74) 129/69  SpO2:  [98 %-100 %] 98 %  There is no height or weight on file to calculate BMI.    General: well-appearing, no apparent distress  HEENT: icteric sclera, moist mucous membranes, PERRL, EOMI  Neck: JVD to mandible  Cardiac: Regular rate and rhythm, 2/6 systolic murmur LSB   Pulm: Normal work of breathing, diminished breath sounds left lung base, otherwise CTAB  Abd: mildly distended, normoactive bowel sounds, nontender, no guarding or rebound  Extrem: Extremities warm peripherally, 2+ BLE edema to shins  Derm: No acute rashes or lesions over exposed skin  Neuro: Moves all extremities spontaneously, grossly nonfocal, no asterixis   Psych: Alert and oriented, participates fully in interview    Test Results:  Data Review:  I have reviewed the labs and studies from the last 24 hours.    Imaging: Radiology studies were personally reviewed                EKG: Personally reviewed, notable for: NSR, no acute ischemic ST-T changes

## 2020-01-26 NOTE — Unmapped (Addendum)
[name] [name] is a 49 y.o. male that presented to [facility] with Chest pain.  ??  Chest pain 2/2 PNA vs acute chest syndrome:??  Patient presented with left anterior chest pain following ~10-day hospitalization. Patient's pain was reproducible with chest wall palpation, however, D-dimer was markedly elevated with associated troponin elevation 0.180??& pleuritic component. Ordered CTA to rule-out PE- no definitive PE. Did have small pleural effusions and LLL atelectasis vs consolidative infection which raised concern for PNA vs acute chest. No fever, tachypnea, hypoxia, with LDH improved from prior.??No ischemic ECG changes to suggest acute coronary syndrome.??Empirically covered with ceftriaxone & azithromycin for acute chest coverage with CT findings??& sickle cell history??and patient improved dramatically within 48 hours. Discharged on cefdinir and azithro to complete 5 day course.   ??  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 Patient endorsed only intermittent compliance with nightly home O2.  - Counseled on strict O2 compliance??  - Received 2 days of PO lasix, pleural effusions above  - Discharge on lasix 20 mg daily (previously discontinued due to dizziness, will need careful monitoring)  ??  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 Autoimmune work-up unremarkable (ANA, anti-SM, liver/kidney microsome negative.)??  - Continue home lactulose 30g TID  ??  Hgb SS disease:??Follows with [facility] Hematology. Evaluating for acute chest as above. Patient denies prior history of acute chest syndrome. Aside from left anterior chest pain, he denies other pain usually experienced during prior crises.   - Discharged on home voxelotor 500 mg daily (did not receive while inpt)  ??  Seizure disorder:??No recent seizures.??  -??Continue??carbamazepine 800 BID and phenytoin 100 qAM + 200 qHS  ??  Macrocytic anemia - thrombocytopenia:??Chronic. As previously postulated, macrocytic anemia was thought to be likely secondary to SS anemia and chronic hemolysis. Thrombocytopenia likely due to liver disease, hemolysis, or medication side effect.  - Continue home EPO 40,000 units twice weekly

## 2020-01-26 NOTE — Unmapped (Signed)
Patient AxO4, POC discussed with patient, verbalized understanding. Heparin drip infusing currently at 18 units/kg/hr. Bed alarm in place. Patient comfortable, not requesting any medication for chest pain. Lidocaine patch Left lower chest. Will continue to monitor    Problem: Adult Inpatient Plan of Care  Goal: Plan of Care Review  Outcome: Progressing     Problem: Pain Acute  Goal: Optimal Pain Control  Outcome: Progressing

## 2020-01-26 NOTE — Unmapped (Signed)
[facility]  Emergency Department Provider Note    ED Clinical Impression     Final diagnoses:   Chest pain, unspecified type (Primary)   Elevated troponin   Generalized abdominal pain       Initial Impression, ED Course, Assessment and Plan     Impression: [name] [name] is a 49 y.o. male with a PMH of sickle cell anemia (EPO injections twice weekly), seizures, hyperbilirubinemia and pulmonary hypertension presenting to the ED for evaluation of 8/10 abdominal pain. Recent admission at [facility] from 5/19-5/28 for similar symptoms.    On exam, patient is nontoxic appearing and in NAD. Vitals WNL. Abdomen soft, distended, diffusely tender but worse in LUQ and RUQ, no rebound or guarding. No asterixis. Diminished breath sounds in bilateral bases. 1+ pitting edema to BLE.  Exquisitely tender to palpation of the left chest wall.    Differential includes exacerbation of sickle cell disease vs ACS versus pleural effusion vs costochondritis versus acute chest.  I suspicion for PE is very low as patient has exquisite reproducible tenderness, and is not tachycardic or hypoxic here.  In regards to his abdominal pain, this is possibly worsening of his known liver disease versus ascites.  He is afebrile here therefore I do not feel that he needs any antibiotics at this time.  POCUS was performed which showed a small to moderate fluid pocket but no safe tappable pocket was visible.    Plan for EKG, basic labs, ammonia, LDH, reticulocytes, troponin, PT-INR, lipase, and CXR.    ED Course as of Jan 26 224   Fri Jan 25, 2020   1610 Aspirin ordered.   Troponin I(!!): 0.160     Discussed with the hospitalist who admit the patient.    Additional Medical Decision Making     I have reviewed the vital signs and the nursing notes. Labs and radiology results that were available during my care of the patient were independently reviewed by me and considered in my medical decision making.     I staffed the case with the ED attending, Dr. Yetta Barre.    I independently visualized the EKG tracing.   I independently visualized the radiology images.   I reviewed the patient's prior medical records that were available for viewing.   I discussed the case with the admitting team.    Portions of this record have been created using Dragon dictation software. Dictation errors have been sought, but may not have been identified and corrected.  ____________________________________________       History     Chief Complaint  Abdominal Pain      HPI   [name] [name] is a 49 y.o. male with a PMH of sickle cell anemia (EPO injections twice weekly), seizures, hyperbilirubinemia and pulmonary hypertension presenting to the ED for evaluation of abdominal pain. Patient reports 1 month of 8/10 generalized abdominal pain and 1 week of left-sided rib pain that onset after being discharged from the hospital, no central chest pain. He has had difficulty taking deep breaths 2/2 rib pain. Patient takes oxycodone as prescribed. He has also had diarrhea due to lactulose use, no hematochezia. He further has had BLE edema which was present during his admission. He currently lives with his mother while he is recuperating. Mother states that patient has been his normal self at home and that his eyes are less yellow than they were when he was admitted. No history of acute chest syndrome. Denies fevers, chills, cough, urinary  symptoms, or vomiting.    Per chart review, patient was admitted to [facility] from 5/19-5/28 for abdominal pain, nausea and vomiting, and hyperbilirubinemia. Abdomen US showed gallstones but indeterminate for cholecystitis and with echogenic, nodular pattern suggestive of chronic liver disease. Hepatology consulted and recommended hepatitis serologies, autoimmune workup and transjugular biopsy, performed by VIR 01/14/20. Autoimmune workup unremarkable. Viral panel negative. Overall findings showed that findings consistent with exacerbation of sickle cell disease. Hepatic wedge pressure findings consistent with portal hypertension. Sickle Cell team recommended EPO injection prior to discharge and was supposed to investigate for presence of varices out-patient. Patient started on lactulose therapy which improved his mental status and was discharged on TID dosing. Abdominal pain treated symptomatically.     Transjugular liver biopsy 01/14/20: suggestive of outflow obstruction and likely related to/exacerbated by patient's history of sickle cell anemia.      Past Medical History:   Diagnosis Date   ??? Anemia    ??? Foot pain    ??? 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 Hypertension    ??? Pulmonary hypertension (CMS-HCC)    ??? Seizures (CMS-HCC)    ??? Sickle cell anemia (CMS-HCC)    ??? Vitamin D deficiency        Patient Active Problem List   Diagnosis   ??? Hyperpotassemia   ??? 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 Sickle cell anemia (CMS-HCC)   ??? Hyperkalemia   ??? Proteinuria   ??? Vitamin D deficiency   ??? Seizure disorder (CMS-HCC)   ??? Pain of left hip joint   ??? Ear infection   ??? Sickle cell crisis (CMS-HCC)   ??? Pulmonary HTN (CMS-HCC)   ??? Anemia   ??? Edema   ??? Hypoxemia   ??? Tobacco use disorder   ??? High output heart failure (CMS-HCC)   ??? Vertigo   ??? Hyperbilirubinemia   ??? Hepatic encephalopathy (CMS-HCC)   ??? Chest pain       Past Surgical History:   Procedure Laterality Date   ??? PR ENDOSCOPIC US EXAM, ESOPH N/A 01/11/2020    Procedure: UGI ENDOSCOPY; WITH ENDOSCOPIC ULTRASOUND EXAMINATION LIMITED TO THE ESOPHAGUS;  Surgeon: Jules Husbands, MD;  [facility];  Service: Gastroenterology   ??? PR RIGHT HEART CATH O2 SATURATION & CARDIAC OUTPUT N/A 07/18/2017    Procedure: Right Heart Catheterization;  Surgeon: Zannie Cove, MD;  Location: [facility] CATH;  Service: Cardiology         Current Facility-Administered Medications:   ???  azithromycin (ZITHROMAX) tablet 250 mg, 250 mg, Oral, Q24H SCH, Phillips Grout, MD  ???  carBAMazepine (TEGretol  XR) 12 hr tablet 800 mg, 800 mg, Oral, BID, Phillips Grout, MD  ???  cefTRIAXone (ROCEPHIN) 1 g in sodium chloride 0.9 % (NS) 100 mL IVPB-connector bag, 1 g, Intravenous, Q24H SCH, Phillips Grout, MD, Last Rate: 200 mL/hr at 01/26/20 0110, 1 g at 01/26/20 0110  ???  cholecalciferol (vitamin D3-10 mcg (400 unit)) tablet 10 mcg, 10 mcg, Oral, Daily, Phillips Grout, MD  ???  [START ON 01/28/2020] epoetin alfa (EPOGEN,PROCRIT) injection 40,000 Units, 40,000 Units, Subcutaneous, Once per day on Mon Thu, Jared Michael Nathanson, MD  ???  folic acid (FOLVITE) tablet 1,000 mcg, 1,000 mcg, Oral, Daily, Phillips Grout, MD  ???  heparin (porcine) 1,000 unit/mL 1000 unit/mL injection 5,000 Units, 5,000 Units, Intravenous, Q6H PRN, Phillips Grout, MD  ???  heparin 25,000 Units/250 mL (  100 units/mL) in 0.45% saline infusion (premade), 18 Units/kg/hr, Intravenous, Continuous, Phillips Grout, MD, Last Rate: 12.38 mL/hr at 01/26/20 0138, 18 Units/kg/hr at 01/26/20 0138  ???  lactulose (CHRONULAC) oral solution (30 mL cup), 30 g, Oral, TID, Phillips Grout, MD  ???  lidocaine (LIDODERM) 5 % patch 1 patch, 1 patch, Transdermal, Once, Alfredia Ferguson, MD, 1 patch at 01/25/20 2102  ???  phenytoin (DILANTIN) ER capsule 100 mg, 100 mg, Oral, Daily **AND** phenytoin (DILANTIN) ER capsule 200 mg, 200 mg, Oral, Nightly, Phillips Grout, MD  ???  polyethylene glycol (MIRALAX) packet 17 g, 17 g, Oral, Daily PRN, Phillips Grout, MD  ???  voxelotor (OXBRYTA) tablet 500 mg, 500 mg, Oral, Daily, Phillips Grout, MD  ???  zinc sulfate (ZINCATE) capsule 220 mg, 220 mg, Oral, BID, Phillips Grout, MD    Current Outpatient Medications:   ???  acetaminophen (TYLENOL 8 HOUR) 650 MG CR tablet, Take 650 mg by mouth every eight (8) hours as needed for pain., Disp: , Rfl:   ???  carBAMazepine (TEGRETOL  XR) 400 MG 12 hr tablet, TAKE 2 TABLETS (800 MG TOTAL) BY MOUTH TWO (2) TIMES A DAY., Disp: 120 tablet, Rfl: 11  ???  cholecalciferol, vitamin D3, (VITAMIN D3) 10 mcg (400 unit) cap, Take 1 capsule (400 Units total) by mouth daily., Disp: 30 each, Rfl: 0  ???  diclofenac sodium (VOLTAREN) 1 % gel, Apply 2 g topically daily as needed for arthritis., Disp: 100 g, Rfl: prn  ???  empty container (SHARPS CONTAINER) Misc, use as directed, Disp: 1 each, Rfl: 2  ???  epoetin alfa (PROCRIT) 40,000 unit/mL injection, Inject 1 mL (40,000 Units total) under the skin Two (2) times a week., Disp: 8 mL, Rfl: 3  ???  folic acid (FOLVITE) 1 MG tablet, TAKE 1 TABLET BY MOUTH EVERY DAY, Disp: 30 tablet, Rfl: 11  ???  lactulose (CONSTULOSE) 10 gram/15 mL solution, Take 45 mL (30 g total) by mouth Three (3) times a day., Disp: 4050 mL, Rfl: 3  ???  lidocaine (LIDODERM) 5 % patch, Place 1 patch on the skin., Disp: , Rfl:   ???  naproxen (NAPROSYN) 375 MG tablet, TAKE 1 TABLET (375 MG TOTAL) BY MOUTH 2 (TWO) TIMES DAILY WITH A MEAL., Disp: , Rfl:   ???  oxyCODONE (ROXICODONE) 5 MG immediate release tablet, Take 1 tablet (5 mg total) by mouth every eight (8) hours as needed for pain for up to 10 doses. (Patient not taking: Reported on 09/04/2019), Disp: 10 tablet, Rfl: 0  ???  OXYGEN-AIR DELIVERY SYSTEMS MISC, 2 L/min by Each Nare route nightly. [employer], Disp: , Rfl:   ???  phenytoin (DILANTIN) 100 MG ER capsule, Take 1 capsule in the morning and 2 capsules in the evening, Disp: 90 capsule, Rfl: 11  ???  promethazine (PHENERGAN) 12.5 MG tablet, Take 12.5 mg by mouth every six (6) hours as needed for nausea., Disp: , Rfl:   ???  syringe with needle (BD TUBERCULIN SYRINGE) 1 mL 27 x 1/2 Syrg, Use as directed twice weekly, Disp: 24 each, Rfl: 3  ???  voxelotor (OXBRYTA) 500 mg tablet, Take 1 tablet (500 mg total) by mouth daily. (Patient not taking: Reported on 10/10/2019), Disp: 30 tablet, Rfl: 11  ???  zinc sulfate (ZINCATE) 50 mg zinc (220 mg) capsule, Take 1 capsule (220 mg total) by mouth Two (2) times a day for 14 days., Disp: 28 capsule, Rfl: 0    Allergies  Patient has no known allergies.    Family History   Problem Relation Age of Onset   ??? Hypertension Mother    ??? Hypertension Father    ??? No Known Problems Sister    ??? No Known Problems Brother    ??? No Known Problems Brother    ??? No Known Problems Brother    ??? No Known Problems Brother    ??? Sickle cell anemia Sister    ??? No Known Problems Sister    ??? Clotting disorder Paternal Grandmother    ??? No Known Problems Maternal Aunt    ??? No Known Problems Maternal Uncle    ??? No Known Problems Paternal Aunt    ??? No Known Problems Paternal Uncle    ??? No Known Problems Maternal Grandmother    ??? No Known Problems Maternal Grandfather    ??? No Known Problems Paternal Grandfather    ??? No Known Problems Other    ??? Seizures Neg Hx    ??? Pulmonary Hypertension Neg Hx    ??? Autoimmune disease Neg Hx    ??? Venous thrombosis Neg Hx    ??? Amblyopia Neg Hx    ??? Blindness Neg Hx    ??? Cancer Neg Hx    ??? Cataracts Neg Hx    ??? Diabetes Neg Hx    ??? Glaucoma Neg Hx    ??? Macular degeneration Neg Hx    ??? Retinal detachment Neg Hx    ??? Strabismus Neg Hx    ??? Stroke Neg Hx    ??? Thyroid disease Neg Hx        Social History  Social History     Tobacco Use   ??? Smoking status: Current Some Day Smoker     Packs/day: 1.00     Years: 0.50     Pack years: 0.50     Types: Cigars   ??? Smokeless tobacco: Never Used   ??? Tobacco comment: Pt smokes 1-2 cigars daily, Pt intends to cease tobacco use    Vaping Use   ??? Vaping Use: Never used   Substance Use Topics   ??? Alcohol use: Not Currently     Alcohol/week: 0.0 standard drinks     Comment: occasional   ??? Drug use: No       Review of Systems  Constitutional: Negative for fever.  Eyes: Negative for visual changes.  ENT: Negative for sore throat.  CV: Negative for chest pain.  Resp: Negative for shortness of breath.  GI: Positive for abdominal pain.  GU: Negative for dysuria.  MSK: Positive for left-sided rib pain. Negative for back pain.  Derm: Negative for rash.  Neuro: Negative for headaches.      Physical Exam     ED Triage Vitals [01/25/20 1715]   Enc Vitals Group      BP 114/62      Heart Rate 70      SpO2 Pulse       Resp 14      Temp 36.9 ??C (98.4 ??F)      Temp Source Oral      SpO2 100 %      Weight       Height      Constitutional: Appears stated age. Well appearing.   HEENT: Normocephalic and atraumatic.Conjunctivae clear. No congestion. Moist mucous membranes.   Heme/Lymph/Immuno: No petechiae or bruising  CV: RRR, no murmurs. Symmetric pulses in all extremities.  Left chest wall is exquisitely  tender to palpation.  Resp: Diminished breath sounds in bilateral bases. Clear to auscultation bilaterally. No wheezes or rhonchii  GI: Abdomen soft, distended, TTP in LUQ and RUQ, no rebound or guarding.  MSK: 1+ pitting edema to BLE. Normal range of motion in all extremities.  Neuro: No asterixis. Normal speech and language. No gross focal neurologic deficits appreciated.  Skin: Warm, dry and intact.  Psychiatric: Mood and affect are normal. Speech and behavior are normal.      EKG   Normal sinus rhythm at 67 bpm, normal axis, normal intervals, no ST elevation or depression.    Radiology     XR Chest 2 views   Final Result      Small left pleural effusion and likely adjacent atelectasis. Cannot exclude superimposed infection.       I personally reviewed the images and lab results for the patient.         Documentation assistance was provided by Mliss Sax, Scribe, on January 25, 2020 at 5:51 PM for [facility], DO.    January 26, 2020 2:27 AM. Documentation assistance provided by the scribe. I was present during the time the encounter was recorded. The information recorded by the scribe was done at my direction and has been reviewed and validated by me. Alfredia Ferguson, DO         Alfredia Ferguson, MD  Resident  01/26/20 [phone]

## 2020-01-26 NOTE — Unmapped (Signed)
POC discussed with pt, pt alert and oriented x4, shows no signs of distress, VSS. Pt went down for CTA today. Pt receiving heparin therapy. Bed in lowest position, bed wheels locked, nonskid footwear applied OOB, call bell within reach, encouraged to use call bell for assistance, no falls during shift  Problem: Adult Inpatient Plan of Care  Goal: Plan of Care Review  Outcome: Progressing  Goal: Patient-Specific Goal (Individualization)  Outcome: Progressing  Goal: Absence of Hospital-Acquired Illness or Injury  Outcome: Progressing  Goal: Optimal Comfort and Wellbeing  Outcome: Progressing  Goal: Readiness for Transition of Care  Outcome: Progressing  Goal: Rounds/Family Conference  Outcome: Progressing     Problem: Pain Acute  Goal: Optimal Pain Control  Outcome: Progressing     Problem: Seizure Disorder Comorbidity  Goal: Maintenance of Seizure Control  Outcome: Progressing

## 2020-01-26 NOTE — Unmapped (Signed)
""  Order was placed for a \""PIV by Venous Access Team (VAT)\"".  Patient was assessed at bedside for placement of a PIV. Mask and protective eyewear were donned. Access was obtained. Blood return noted.  Dressing intact and device well secured.  Flushed with normal saline.  Pt advised to inform RN of any s/s of discomfort at the PIV site.    Workup / Procedure Time:  30 minutes       Care RN was notified.       Thank you,     Gillie Manners RN Venous Access Team""

## 2020-01-26 NOTE — Unmapped (Signed)
Care Management  Initial Transition Planning Assessment  CM met with patient in pt room.  Pt/visitors were not wearing hospital provided masks for the duration of the interaction with CM.   CM was wearing hospital provided surgical mask and hospital provided eye protection.  CM was not within 6 foot of the patient/visitors during this interaction.               General  Care Manager assessed the patient by : In person interview with patient, Medical record review, Discussion with Clinical Care team  Orientation Level: Oriented X4  Functional level prior to admission: Independent  Reason for referral: Discharge Planning    Contact/Decision Maker  Extended Emergency Contact Information  Primary Emergency Contact: Melfi Sr,Carolyn/John   [place]  Home Phone: [phone]  Mobile Phone: [phone]  Relation: Mother    Legal Next of Kin / Guardian / POA / Advance Directives   Patient is able to make their own healthcare decisions.      HCDM (patient stated preference): Danelle Earthly - Mother - [phone]    Advance Directive (Medical Treatment)  Does patient have an advance directive covering medical treatment?: Patient does not have advance directive covering medical treatment.  Reason patient does not have an advance directive covering medical treatment:: Patient does not wish to complete one at this time.    Health Care Decision Maker [HCDM] (Medical & Mental Health Treatment)  Healthcare Decision Maker: HCDM documented in the HCDM/Contact Info section.  Information offered on HCDM, Medical & Mental Health advance directives:: Patient declined information.    Advance Directive (Mental Health Treatment)  Does patient have an advance directive covering mental health treatment?: Patient does not have advance directive covering mental health treatment.  Reason patient does not have an advance directive covering mental health treatment:: HCDM documented in the HCDM/Contact Info section.    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  Past Surgical History:   has a past surgical history that includes pr right heart cath o2 saturation & cardiac output (N/A, 07/18/2017) and pr endoscopic US exam, esoph (N/A, 01/11/2020).   Previous admit date: 01/11/2020    Primary Insurance- Payor: MEDICARE / Plan: MEDICARE PART A AND PART B / Product Type: *No Product type* /   Secondary Insurance ??? Secondary Insurance  MEDICAID [place]  Prescription Coverage ??? Medicare/Medicaid  Preferred Pharmacy - [facility] [phone] - [place], [place] - [address]  [facility]  [facility]    Transportation home: Private vehicle    Lives with: Alone    Type of Residence: Private residence        Location/Detail: [address] [place], [place], [place] [address] 1 level, 3-4 steps    Support Systems/Concerns: Parent, Family Members    Responsibilities/Dependents at home?: No    Home Care services in place prior to admission?: No     Equipment Currently Used at Home: oxygen, other (see comments) (EPO administration supplies)      Currently receiving outpatient dialysis?: No  Financial Information   Disability, Medicare/Medicaid.    Need for financial assistance?: No       Social Determinants of Health  Social Determinants of Health     Tobacco Use: High Risk   ??? Smoking Tobacco Use: Current Some Day Smoker   ??? Smokeless Tobacco Use: Never Used   Alcohol Use:    ??? How often do you have 5 or more drinks on one occasion?:    ??? How many drinks containing alcohol do you have on a typical day when you are drinking?:    ??? How often do you have a drink containing alcohol?:    Financial Resource Strain: Low Risk    ??? Difficulty of Paying Living Expenses: Not hard at all   Food Insecurity: No Food Insecurity   ??? Worried About Programme researcher, broadcasting/film/video in the Last Year: Never true   ??? Ran Out of Food in the Last Year: Never true   Transportation Needs: No Transportation Needs   ??? Lack of Transportation (Medical): No   ??? Lack of Transportation (Non-Medical): No   Physical Activity:    ??? Days of Exercise per Week:    ??? Minutes of Exercise per Session:    Stress:    ??? Feeling of Stress :    Social Connections:    ??? Frequency of Communication with Friends and Family:    ??? Frequency of Social Gatherings with Friends and Family:    ??? Attends Religious Services:    ??? Database administrator or Organizations:    ??? Attends Engineer, structural:    ??? Marital Status:    Intimate Programme researcher, broadcasting/film/video Violence:    ??? Fear of Current or Ex-Partner:    ??? Emotionally Abused:    ??? Physically Abused:    ??? Sexually Abused:    Depression:    ??? PHQ-2 Score:    Housing Stability: Low Risk    ??? Within the past 12 months, have you ever stayed: outside, in a car, in a tent, in an overnight shelter, or temporarily in someone else's home (i.e. couch-surfing)?: No   ??? Are you worried about losing your housing?: No   ??? Within the past 12 months, have you been unable to get utilities (heat, electricity) when it was really needed?: No   Substance Use:    ??? Taken prescription drugs for non-medical reasons:    ??? Taken illegal drugs:    ??? Patient indicated they have taken drugs in the past year, including Cannabis, Cocaine, Prescription stimulants, Methamphetamine, Inahalnts, Sedatives or sleeping pills, Hallucinogens, Street Opioids or Prescription opiods for non-medical reasons:    Health Literacy:    ??? :        Discharge Needs Assessment Concerns to be Addressed: adjustment to diagnosis/illness, coping/stress, discharge planning, decision making    Clinical Risk Factors: Multiple Diagnoses (Chronic)    Barriers to taking medications: No    Prior overnight hospital stay or ED visit in last 90 days: Yes    Readmission Within the Last 30 Days: previous discharge plan unsuccessful    Anticipated Changes Related to Illness: none    Equipment Needed After Discharge: oxygen, other (see comments) (supplies for EPO administration)    Discharge Facility/Level of Care Needs: other (see comments) (home with self care)    Readmission  Risk of Unplanned Readmission Score: UNPLANNED READMISSION SCORE: 17%  Predictive Model Details          17% (Medium)  Factor  Value    Calculated 01/26/2020 16:03 17% Number of ED visits in last six months 4    [place] Risk of Unplanned Readmission Model 16% Number of active Rx orders 30     15% Active NSAID Rx order present     7% ECG/EKG order present in last 6 months     7% Charlson Comorbidity Index 8     7% Latest calcium low (8.3 mg/dL)     6% Diagnosis of electrolyte disorder present     5% Imaging order present in last 6 months     5% Latest hemoglobin low (7.7 g/dL)     4% Number of hospitalizations in last year 1     4% Diagnosis of deficiency anemia present     3% Active anticoagulant Rx order present     3% Age 49     2% Future appointment scheduled     1% Current length of stay 0.68 days      Readmitted Within the Last 30 Days? (No if blank) Yes  Patient at risk for readmission?: Yes    Discharge Plan  Screen findings are: Discharge planning needs identified or anticipated (Comment).    Expected Discharge Date: 01/27/2020    Expected Transfer from Critical Care:      Quality data for continuing care services shared with patient and/or representative?: N/A  Patient and/or family were provided with choice of facilities / services that are available and appropriate to meet post hospital care needs?: N/A       Initial Assessment complete?: Yes    CM will continue to follow for care progression and avoidable delays.    Darl Pikes) Trixie Dredge, RN, BSN  Care Manager [phone])  01/26/2020 4:25 PM

## 2020-01-26 NOTE — Unmapped (Signed)
Medicine Daily Progress Note    Assessment/Plan:    Principal Problem:    Chest pain  Active Problems:    Sickle cell anemia (CMS-HCC)    Seizure disorder (CMS-HCC)    Pulmonary HTN (CMS-HCC)  Resolved Problems:    * No resolved hospital problems. *      [name] [name] is a 49 y.o. male that presented to [facility] with Chest pain.    Chest pain: Patient presents with left anterior chest pain following ~10-day hospitalization. Patient's pain is reproducible with chest wall palpation, however D-dimer is markedly elevated with associated troponin elevation 0.180 & pleuritic component. In this setting will pursue CTA to rule-out PE. Other considerations with CXR findings include acute chest, though patient lacks fever, tachypnea, hypoxia, with LDH improved from prior. No ischemic ECG changes to suggest acute coronary syndrome.   - s/p ASA 325mg  PO in ED  - Monitor telemetry   - CTA ordered to r/o PE with markedly elevated D-dimer              - ED unable to obtain sufficient PIV access for contrasted study              - Started on  empiric heparin gtt for presumptive PE              - Vascular access team were able to get IV access. Reorded CTA  - Empirically cover with ceftriaxone & azithromycin for acute chest coverage with CXR findings & sickle cell history   ??  Pulmonary hypertension: Group 5 secondary to sickle cell disease +/- Group 2 from left-sided heart failure in setting of high-output heart failure from severe anemia . Patient endorses intermittent compliance with nightly home O2.  - Counseled on strict O2 compliance   - Orders placed for 2L nightly O2   - Holding on diuresis   ??  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 Autoimmune work-up unremarkable (ANA, anti-SM, liver/kidney microsome negative.)   - Continue home lactulose 30g TID  ??  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 Evaluating for acute chest as above. Patient denies prior history of acute chest syndrome. Aside from left anterior chest pain, he denies other pain usually experienced during prior crises.   - Home voxelotor 500 mg daily  ??  Seizure disorder: No recent seizures.   -??Continue??carbamazepine 800 BID and phenytoin 100 qAM + 200 qHS  ??  Macrocytic anemia - thrombocytopenia:??Chronic. As previously postulated, macrocytic anemia was thought to be likely secondary to SS anemia and chronic hemolysis. Thrombocytopenia likely due to liver disease, hemolysis, or medication side effect.  - CTM   - Continue home EPO 40,000 units twice weekly   ??  Daily Checklist:  Diet: Regular  DVT ppx: AC as above  Bowel regimen: Miralax PRN  Dispo: Admit to  Med H, floor status  ??  Code Status:  Full Code     Floor time 75 minutes minutes, > 50% spent in counseling and coordination of care about the following issues:  review of prior medical records including labs & imaging, review of current labs/imaging, coordination of care with ED providers, discussions with patient regarding plan of care.  ??    ___________________________________________________________________    Subjective:  No acute events  overnight. Feels that pain has not improved significantly.     Labs/Studies:  Labs and Studies from the last 24hrs per EMR and Reviewed    @LDAPRESSUREULCER @    Objective:  Temp:  [36.8 ??C (98.2 ??F)-36.9 ??C (98.4 ??F)] 36.8 ??C (98.2 ??F)  Heart Rate:  [61-70] 68  SpO2 Pulse:  [63-69] 67  Resp:  [14-18] 18  BP: (114-130)/(62-74) 130/63  SpO2:  [96 %-100 %] 96 %    General: well-appearing, no apparent distress  HEENT: icteric sclera, moist mucous membranes, PERRL, EOMI  Neck: JVD to mandible  Cardiac: Regular rate and rhythm, 2/6 systolic murmur LSB   Pulm: Normal work of breathing, diminished breath sounds left lung base, otherwise CTAB  Abd: mildly distended, normoactive bowel sounds, nontender, no guarding or rebound  Extrem: Extremities warm peripherally, 2+ BLE edema to shins  Derm: No acute rashes or lesions over exposed skin  Neuro: Moves all extremities spontaneously, grossly nonfocal, no asterixis   Psych: Alert and oriented, participates fully in interview

## 2020-01-26 NOTE — Unmapped (Signed)
Pt to ED for reports of abd pain, onset 1 month ago.  Rates as 8/10

## 2020-01-27 DIAGNOSIS — D5700 Hb-SS disease with crisis, unspecified: Principal | ICD-10-CM

## 2020-01-27 LAB — SMEAR REVIEW

## 2020-01-27 LAB — CBC W/ AUTO DIFF
BASOPHILS ABSOLUTE COUNT: 0 10*9/L (ref 0.0–0.1)
BASOPHILS RELATIVE PERCENT: 0.6 %
HEMATOCRIT: 20.1 % — ABNORMAL LOW (ref 41.0–53.0)
HEMOGLOBIN: 6.6 g/dL — ABNORMAL LOW (ref 13.5–17.5)
LARGE UNSTAINED CELLS: 2 % (ref 0–4)
LYMPHOCYTES ABSOLUTE COUNT: 2.4 10*9/L (ref 1.5–5.0)
LYMPHOCYTES RELATIVE PERCENT: 33.7 %
MEAN CORPUSCULAR HEMOGLOBIN CONC: 32.6 g/dL (ref 31.0–37.0)
MEAN CORPUSCULAR HEMOGLOBIN: 35.8 pg — ABNORMAL HIGH (ref 26.0–34.0)
MEAN CORPUSCULAR VOLUME: 110.1 fL — ABNORMAL HIGH (ref 80.0–100.0)
MEAN PLATELET VOLUME: 10.1 fL — ABNORMAL HIGH (ref 7.0–10.0)
MONOCYTES RELATIVE PERCENT: 17.1 %
NEUTROPHILS ABSOLUTE COUNT: 3.2 10*9/L (ref 2.0–7.5)
NUCLEATED RED BLOOD CELLS: 1 /100{WBCs} (ref <=4)
PLATELET COUNT: 114 10*9/L — ABNORMAL LOW (ref 150–440)
RED BLOOD CELL COUNT: 1.83 10*12/L — ABNORMAL LOW (ref 4.50–5.90)
RED CELL DISTRIBUTION WIDTH: 20.9 % — ABNORMAL HIGH (ref 12.0–15.0)
WBC ADJUSTED: 7.1 10*9/L (ref 4.5–11.0)

## 2020-01-27 LAB — BILIRUBIN TOTAL: Bilirubin:MCnc:Pt:Ser/Plas:Qn:: 8.4 — ABNORMAL HIGH

## 2020-01-27 LAB — COMPREHENSIVE METABOLIC PANEL
ALBUMIN: 2.9 g/dL — ABNORMAL LOW (ref 3.5–5.0)
ALKALINE PHOSPHATASE: 270 U/L — ABNORMAL HIGH (ref 38–126)
ALT (SGPT): 21 U/L (ref <50)
ANION GAP: 4 mmol/L — ABNORMAL LOW (ref 7–15)
AST (SGOT): 65 U/L — ABNORMAL HIGH (ref 19–55)
BILIRUBIN TOTAL: 8.4 mg/dL — ABNORMAL HIGH (ref 0.0–1.2)
BLOOD UREA NITROGEN: 12 mg/dL (ref 7–21)
BUN / CREAT RATIO: 17
CHLORIDE: 110 mmol/L — ABNORMAL HIGH (ref 98–107)
CREATININE: 0.71 mg/dL (ref 0.70–1.30)
EGFR CKD-EPI AA MALE: >90 mL/min/{1.73_m2} (ref >=60)
EGFR CKD-EPI NON-AA MALE: >90 mL/min/{1.73_m2} (ref >=60)
GLUCOSE RANDOM: 87 mg/dL (ref 70–179)
POTASSIUM: 4.4 mmol/L (ref 3.5–5.0)
PROTEIN TOTAL: 7.2 g/dL (ref 6.5–8.3)
SODIUM: 137 mmol/L (ref 135–145)

## 2020-01-27 LAB — LACTATE DEHYDROGENASE: Lactate dehydrogenase:CCnc:Pt:Ser/Plas:Qn:Reaction: pyruvate to lactate: 1722 — ABNORMAL HIGH

## 2020-01-27 LAB — RETICULOCYTE ABSOLUTE COUNT, MANUAL: Lab: 236.1 — ABNORMAL HIGH

## 2020-01-27 LAB — SLIDE REVIEW

## 2020-01-27 LAB — NEUTROPHILS ABSOLUTE COUNT: Neutrophils:NCnc:Pt:Bld:Qn:Automated count: 3.2

## 2020-01-27 MED ADMIN — phenytoin (DILANTIN) ER capsule 200 mg: 200 mg | ORAL

## 2020-01-27 MED ADMIN — oxyCODONE (ROXICODONE) immediate release tablet 5 mg: 5 mg | ORAL | @ 02:00:00 | Stop: 2020-01-26

## 2020-01-27 MED ADMIN — zinc sulfate (ZINCATE) capsule 220 mg: 220 mg | ORAL | @ 13:00:00

## 2020-01-27 MED ADMIN — zinc sulfate (ZINCATE) capsule 220 mg: 220 mg | ORAL

## 2020-01-27 MED ADMIN — carBAMazepine (TEGretol  XR) 12 hr tablet 800 mg: 800 mg | ORAL

## 2020-01-27 MED ADMIN — cefTRIAXone (ROCEPHIN) 1 g in sodium chloride 0.9 % (NS) 100 mL IVPB-connector bag: 1 g | INTRAVENOUS | @ 01:00:00 | Stop: 2020-02-01

## 2020-01-27 MED ADMIN — carBAMazepine (TEGretol  XR) 12 hr tablet 800 mg: 800 mg | ORAL | @ 13:00:00

## 2020-01-27 NOTE — Unmapped (Signed)
POC discussed with patient, verbalized understanding. Receiving scheduled IV antibiotics. Oxy 5 given once. Lidocaine patch due in AM, patient aware. Steady gait, patient ambulate around unit. No signs of distress. Will continue to monitor.     Problem: Adult Inpatient Plan of Care  Goal: Plan of Care Review  Outcome: Progressing     Problem: Pain Acute  Goal: Optimal Pain Control  Outcome: Progressing     Problem: Seizure Disorder Comorbidity  Goal: Maintenance of Seizure Control  Outcome: Progressing

## 2020-01-27 NOTE — Unmapped (Addendum)
Medicine Daily Progress Note    Assessment/Plan:    Principal Problem:    Chest pain  Active Problems:    Sickle cell anemia (CMS-HCC)    Seizure disorder (CMS-HCC)    Pulmonary HTN (CMS-HCC)  Resolved Problems:    * No resolved hospital problems. *      [name] [name] is a 49 y.o. male that presented to [facility] with Chest pain.    Chest pain 2/2 PNA vs acute chest syndrome:   Patient presented with left anterior chest pain following ~10-day hospitalization. Patient's pain is reproducible with chest wall palpation, however D-dimer wass markedly elevated with associated troponin elevation 0.180 & pleuritic component. In this setting we pursued CTA to rule-out PE- no definitive PE. Does have small pleural effusions and LLL atelectasis vs consolidative infection which raised concern for PNA vs acute chest, though patient lacks fever, tachypnea, hypoxia, with LDH improved from prior. No ischemic ECG changes to suggest acute coronary syndrome.   Patient feels much improved today which leads me to believe that maybe he does in fact have PNA  - Empirically cover with ceftriaxone & azithromycin for acute chest coverage with CT findings & sickle cell history   ??  Pulmonary hypertension: Group 5 secondary to sickle cell disease +/- Group 2 from left-sided heart failure in setting of high-output heart failure from severe anemia . Patient endorses intermittent compliance with nightly home O2.  - Counseled on strict O2 compliance   - Orders placed for 2L nightly O2   - Restart lasix 20 mg daily given pleural effusions as above (stopped previously due to dizziness)  ??  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 Autoimmune work-up unremarkable (ANA, anti-SM, liver/kidney microsome negative.)   - Continue home lactulose 30g TID  ??  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 Evaluating for acute chest as above. Patient denies prior history of acute chest syndrome. Aside from left anterior chest pain, he denies other pain usually experienced during prior crises.   - holding home voxelotor 500 mg daily  ??  Seizure disorder: No recent seizures.   -??Continue??carbamazepine 800 BID and phenytoin 100 qAM + 200 qHS  ??  Macrocytic anemia - thrombocytopenia:??Chronic. As previously postulated, macrocytic anemia was thought to be likely secondary to SS anemia and chronic hemolysis. Thrombocytopenia likely due to liver disease, hemolysis, or medication side effect.  - CTM   - Continue home EPO 40,000 units twice weekly   ??  Daily Checklist:  Diet: Regular  DVT ppx: AC as above  Bowel regimen: Miralax PRN  Dispo: Admit to  Med H, floor status  ??  Code Status:  Full Code     Floor time 75 minutes minutes, > 50% spent in counseling and coordination of care about the following issues:  review of prior medical records including labs & imaging, review of current labs/imaging, coordination of care with ED providers, discussions with patient regarding plan of care.  ??    ___________________________________________________________________    Subjective:  No acute events overnight. Feels that pain has not improved significantly.     Labs/Studies:  Labs and Studies from the last 24hrs per EMR and Reviewed    @LDAPRESSUREULCER @    Objective:  Temp:  [36.6 ??C (97.9 ??F)-37 ??C (98.6 ??F)]  37 ??C (98.6 ??F)  Heart Rate:  [64-79] 64  Resp:  [18-20] 20  BP: (106-122)/(41-65) 114/63  SpO2:  [93 %-99 %] 99 %    General: well-appearing, no apparent distress  HEENT: icteric sclera, moist mucous membranes, PERRL, EOMI  Neck: JVD to mandible  Cardiac: Regular rate and rhythm, 2/6 systolic murmur LSB   Pulm: Normal work of breathing, diminished breath sounds left lung base, otherwise CTAB  Abd: mildly distended, normoactive bowel sounds, nontender, no guarding or rebound  Extrem: Extremities warm peripherally, 2+ BLE edema to shins  Derm: No acute rashes or lesions over exposed skin  Neuro: Moves all extremities spontaneously, grossly nonfocal, no asterixis   Psych: Alert and oriented, participates fully in interview

## 2020-01-28 LAB — COMPREHENSIVE METABOLIC PANEL
ALBUMIN: 2.9 g/dL — ABNORMAL LOW (ref 3.5–5.0)
ALKALINE PHOSPHATASE: 264 U/L — ABNORMAL HIGH (ref 38–126)
ALT (SGPT): 21 U/L (ref <50)
ANION GAP: 5 mmol/L — ABNORMAL LOW (ref 7–15)
AST (SGOT): 68 U/L — ABNORMAL HIGH (ref 19–55)
BILIRUBIN TOTAL: 7.9 mg/dL — ABNORMAL HIGH (ref 0.0–1.2)
BLOOD UREA NITROGEN: 11 mg/dL (ref 7–21)
BUN / CREAT RATIO: 17
CALCIUM: 8.2 mg/dL — ABNORMAL LOW (ref 8.5–10.2)
CHLORIDE: 112 mmol/L — ABNORMAL HIGH (ref 98–107)
CO2: 23 mmol/L (ref 22.0–30.0)
CREATININE: 0.66 mg/dL — ABNORMAL LOW (ref 0.70–1.30)
EGFR CKD-EPI AA MALE: >90 mL/min/{1.73_m2} (ref >=60)
EGFR CKD-EPI NON-AA MALE: >90 mL/min/{1.73_m2} (ref >=60)
GLUCOSE RANDOM: 88 mg/dL (ref 70–179)
POTASSIUM: 4.3 mmol/L (ref 3.5–5.0)
PROTEIN TOTAL: 7.3 g/dL (ref 6.5–8.3)
SODIUM: 140 mmol/L (ref 135–145)

## 2020-01-28 LAB — HEMATOCRIT: Hematocrit:VFr:Pt:Bld:Qn:: 21 — ABNORMAL LOW

## 2020-01-28 LAB — CREATININE: Creatinine:MCnc:Pt:Ser/Plas:Qn:: 0.66 — ABNORMAL LOW

## 2020-01-28 LAB — LACTATE DEHYDROGENASE: Lactate dehydrogenase:CCnc:Pt:Ser/Plas:Qn:Reaction: pyruvate to lactate: 1697 — ABNORMAL HIGH

## 2020-01-28 LAB — MANUAL DIFFERENTIAL
BASOPHILS - ABS (DIFF): 0 10*9/L (ref 0.0–0.1)
BASOPHILS - REL (DIFF): 0 %
LYMPHOCYTES - ABS (DIFF): 2.9 10*9/L (ref 1.5–5.0)
LYMPHOCYTES - REL (DIFF): 47 %
MONOCYTES - ABS (DIFF): 0.7 10*9/L (ref 0.2–0.8)
MONOCYTES - REL (DIFF): 12 %
NEUTROPHILS - ABS (DIFF): 2.3 10*9/L (ref 2.0–7.5)
NEUTROPHILS - REL (DIFF): 37 %

## 2020-01-28 LAB — CBC W/ AUTO DIFF
HEMATOCRIT: 21 % — ABNORMAL LOW (ref 41.0–53.0)
MEAN CORPUSCULAR VOLUME: 109.6 fL — ABNORMAL HIGH (ref 80.0–100.0)
MEAN PLATELET VOLUME: 10.2 fL — ABNORMAL HIGH (ref 7.0–10.0)
NUCLEATED RED BLOOD CELLS: 13 /100{WBCs} — ABNORMAL HIGH (ref <=4)
PLATELET COUNT: 111 10*9/L — ABNORMAL LOW (ref 150–440)
RED BLOOD CELL COUNT: 1.92 10*12/L — ABNORMAL LOW (ref 4.50–5.90)
RED CELL DISTRIBUTION WIDTH: 20.4 % — ABNORMAL HIGH (ref 12.0–15.0)

## 2020-01-28 LAB — RETIC COUNT, MANUAL: Lab: 9.7 — ABNORMAL HIGH

## 2020-01-28 LAB — MONOCYTES - REL (DIFF): Monocytes/100 leukocytes:NFr:Pt:Bld:Qn:Manual count: 12

## 2020-01-28 LAB — RETICULOCYTES: RETIC COUNT, MANUAL: 9.7 % — ABNORMAL HIGH (ref 0.8–2.0)

## 2020-01-28 MED ORDER — CEFDINIR 300 MG CAPSULE
ORAL_CAPSULE | Freq: Two times a day (BID) | ORAL | 0 refills | 2.00000 days | Status: CP
Start: 2020-01-28 — End: ?
  Filled 2020-01-28: qty 4, 2d supply, fill #0

## 2020-01-28 MED ORDER — AZITHROMYCIN 250 MG TABLET
ORAL_TABLET | ORAL | 0 refills | 2 days | Status: CP
Start: 2020-01-28 — End: 2020-01-30
  Filled 2020-01-28: qty 2, 2d supply, fill #0

## 2020-01-28 MED ADMIN — cholecalciferol (vitamin D3-10 mcg (400 unit)) tablet 10 mcg: 10 ug | ORAL | @ 13:00:00 | Stop: 2020-01-28

## 2020-01-28 MED ADMIN — furosemide (LASIX) tablet 20 mg: 20 mg | ORAL | @ 13:00:00 | Stop: 2020-01-28

## 2020-01-28 MED ADMIN — cefTRIAXone (ROCEPHIN) 1 g in sodium chloride 0.9 % (NS) 100 mL IVPB-connector bag: 1 g | INTRAVENOUS | @ 01:00:00 | Stop: 2020-02-01

## 2020-01-28 MED ADMIN — epoetin alfa (EPOGEN,PROCRIT) injection 40,000 Units: 40000 [IU] | SUBCUTANEOUS | @ 14:00:00 | Stop: 2020-01-28

## 2020-01-28 MED ADMIN — oxyCODONE (ROXICODONE) immediate release tablet 5 mg: 5 mg | ORAL | @ 01:00:00 | Stop: 2020-01-27

## 2020-01-28 MED ADMIN — enoxaparin (LOVENOX) syringe 40 mg: 40 mg | SUBCUTANEOUS | @ 13:00:00 | Stop: 2020-01-28

## 2020-01-28 MED ADMIN — lidocaine (LIDODERM) 5 % patch 1 patch: 1 | TRANSDERMAL | @ 13:00:00 | Stop: 2020-01-28

## 2020-01-28 MED ADMIN — carBAMazepine (TEGretol  XR) 12 hr tablet 800 mg: 800 mg | ORAL | @ 01:00:00

## 2020-01-28 MED ADMIN — lactulose (CHRONULAC) oral solution (30 mL cup): 30 g | ORAL | @ 13:00:00 | Stop: 2020-01-28

## 2020-01-28 MED ADMIN — phenytoin (DILANTIN) ER capsule 100 mg: 100 mg | ORAL | @ 13:00:00 | Stop: 2020-01-28

## 2020-01-28 MED ADMIN — lactated ringers bolus 500 mL: 500 mL | INTRAVENOUS | @ 15:00:00 | Stop: 2020-01-28

## 2020-01-28 MED ADMIN — lactulose (CHRONULAC) oral solution (30 mL cup): 30 g | ORAL | @ 17:00:00 | Stop: 2020-01-28

## 2020-01-28 MED ADMIN — azithromycin (ZITHROMAX) tablet 250 mg: 250 mg | ORAL | @ 02:00:00 | Stop: 2020-01-30

## 2020-01-28 MED FILL — CEFDINIR 300 MG CAPSULE: 2 days supply | Qty: 4 | Fill #0 | Status: AC

## 2020-01-28 MED FILL — FUROSEMIDE 20 MG TABLET: 30 days supply | Qty: 30 | Fill #0 | Status: AC

## 2020-01-28 MED FILL — AZITHROMYCIN 250 MG TABLET: 2 days supply | Qty: 2 | Fill #0 | Status: AC

## 2020-01-28 NOTE — Unmapped (Signed)
Inpatient Tobacco Cessation Counseling Note    This medical encounter was conducted virtually using [email or web address]  TeleHealth protocols.    I have identified myself to the patient and conveyed my credentials to [name]  I have explained the capabilities and limitations of telemedicine and the patient/proxy and myself both agree that it is appropriate for their current circumstances/symptoms.     Contact Information  Person Contacted: Arville Lime         Contact Phone number: [phone]      Phone Outcome: Spoke with pt  Is there someone else in the room? No.     Patient's location at the time of the telephone visit: Hospitalized at [facility]   Provider's location at the time of the telephone visit: At home, in [place]      Purpose of contact:     Pt participated in a telephone visit for tobacco cessation counseling.  Patient was admitted to hospital for Generalized abdominal pain [R10.84]  Elevated troponin [R77.8]  Chest pain, unspecified type [R07.9]. Patient consented to telephone visit given due to social isolation measures in place due to the COVID-19 pandemic.     Tobacco Use History and Assessment  Time Since Last Tobacco Use: 8 days to 1 month ago  Tobacco Withdrawal (Past 24 Hours): None noted  Type of Tobacco Products Used: Little cigars  Quantity Used: 1  Quantity Per: day  Relight Cigarettes: Yes  Other Household Members Use Tobacco: Lives alone  Smoking Allowed in Home: Yes  Previously seen by NDP?: Hospital Inpatient    Behavioral Assessment  Why Uses: habit during certain times of the day  Reasons to Become Tobacco Free: health  Barriers/Challenges: 1. long-standing habit 2. boredom  Strategies: 1. pt can use drinking straws and cinnamon toothpicks as hand to mouth replacement throughout the day 2. pt can make his home smoke-free         NOTE: Pt states he has not smoked in 1-2 weeks due to his consecutive hospitalizations, noting it just happened' in regards to him quitting. Pt intends to remain tobacco-free due to his health. He does not report cravings and does not anticipate any challenges with quitting as he was down to 1 cigar/day. He declines use of NRT at this time. SW provided education on smoking and healing. SW provided pt with contact information, physical improvements related to tobacco cessation, and available resource (including outpt Tobacco Treatment Program at [facility] and [facility]).         Treatment Plan  Please see below for medication recommendations in bold.   Cessation Meds Currently Using: None  Medications Recommended During Hospitalization: Patient declines  Outpatient/Discharge Medications Recommended: Patient declines  Patient's Plan Post Discharge/Visit: Stay quit             As part of this Telephone Visit, no in-person exam was conducted.     I personally spent 9 minutes counseling the patient via telephone about tobacco cessation.  I spent an additional 5 minutes on pre- and post-visit activities.      The patient was physically located in [place] or a state in which I am permitted to provide care. The patient and/or parent/guardian understood that s/he may incur co-pays and cost sharing, and agreed to the telemedicine visit. The visit was reasonable and appropriate under the circumstances given the patient's presentation at the time.     The patient and/or parent/guardian has been advised of the potential risks and  limitations of this mode of treatment (including, but not limited to, the absence of in-person examination) and has agreed to be treated using telemedicine. The patient's/patient's family's questions regarding telemedicine have been answered.      If the visit was completed in an ambulatory setting, the patient and/or parent/guardian has also been advised to contact their provider???s office for worsening conditions, and seek emergency medical treatment and/or call 911 if the patient deems either necessary. Visit Format/Coding: Telephone     Coding: 16109 (11-20 minutes)  Service rendered over the phone most consistent with: Tobacco cessation counseling, greater than 10 minutes ([id number])     Sara Chu, LCSW, LCAS, NCTTP  Clinical Social Worker / Tobacco Treatment Specialist  Tobacco Treatment Program  [facility]  phone: [phone]  pager: [phone]

## 2020-01-28 NOTE — Unmapped (Signed)
Physician Discharge Summary    Identifying Information:   [name] [name]  17-Jan-1971  [id number]    Admit date: 01/25/2020    Discharge date: 01/28/2020     Discharge Service: Med [facility])    Discharge Attending Physician: Santa Genera, MD    Discharge to: Home    Discharge Diagnoses:  Principal Problem:    Chest pain  Active Problems:    Sickle cell anemia (CMS-HCC)    Seizure disorder (CMS-HCC)    Pulmonary HTN (CMS-HCC)    Tobacco use disorder  Resolved Problems:    * No resolved hospital problems. *    Post Discharge Follow Up Issues:   ?? Monitor for full resolution of chest pain  ?? Volume status after restarting lasix    Hospital Course:   [name] [name] is a 49 y.o. male that presented to [facility] with Chest pain.  ??  Chest pain 2/2 PNA vs acute chest syndrome:??  Patient presented with left anterior chest pain following ~10-day hospitalization. Patient's pain was reproducible with chest wall palpation, however, D-dimer was markedly elevated with associated troponin elevation 0.180??& pleuritic component. Ordered CTA to rule-out PE- no definitive PE. Did have small pleural effusions and LLL atelectasis vs consolidative infection which raised concern for PNA vs acute chest. No fever, tachypnea, hypoxia, with LDH improved from prior.??No ischemic ECG changes to suggest acute coronary syndrome.??Empirically covered with ceftriaxone & azithromycin for acute chest coverage with CT findings??& sickle cell history??and patient improved dramatically within 48 hours. Discharged on cefdinir and azithro to complete 5 day course.   ??  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 Patient endorsed only intermittent compliance with nightly home O2.  - Counseled on strict O2 compliance??  - Received 2 days of PO lasix, pleural effusions above  - Discharge on lasix 20 mg daily (previously discontinued due to dizziness, will need careful monitoring)  ??  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 Autoimmune work-up unremarkable (ANA, anti-SM, liver/kidney microsome negative.)??  - Continue home lactulose 30g TID  ??  Hgb SS disease:??Follows with [facility]. Evaluating for acute chest as above. Patient denies prior history of acute chest syndrome. Aside from left anterior chest pain, he denies other pain usually experienced during prior crises.   - Discharged on home voxelotor 500 mg daily (did not receive while inpt)  ??  Seizure disorder:??No recent seizures.??  -??Continue??carbamazepine 800 BID and phenytoin 100 qAM + 200 qHS  ??  Macrocytic anemia - thrombocytopenia:??Chronic. As previously postulated, macrocytic anemia was thought to be likely secondary to SS anemia and chronic hemolysis. Thrombocytopenia likely due to liver disease, hemolysis, or medication side effect.  - Continue home EPO 40,000 units twice weekly??  ??      Procedures:  None  No admission procedures for hospital encounter.  _____________________________________________________________________________  Discharge Day Services:  BP 108/67  - Pulse 67  - Temp 36.9 ??C (98.4 ??F) (Oral)  - Resp 18  - Ht 172.7 cm (5' 8)  - Wt 68.6 kg (151 lb 3.8 oz)  - SpO2 100%  - BMI 23.00 kg/m??   Pt seen on the day of discharge and determined appropriate for discharge.  Condition at Discharge: good    Length of Discharge: I spent greater than 30 mins in the discharge of this patient.  _____________________________________________________________________________  Discharge Medications:     Your Medication List      STOP taking these medications    oxyCODONE 5 MG immediate release tablet  Commonly known as: ROXICODONE        START taking these medications    azithromycin 250 MG tablet  Commonly known as: ZITHROMAX  Take 1 tablet (250 mg total) by mouth daily for 2 days.     cefdinir 300 MG capsule  Commonly known as: OMNICEF  Take 1 capsule (300 mg total) by mouth Two (2) times a day.     furosemide 20 MG tablet  Commonly known as: LASIX  Take 1 tablet (20 mg total) by mouth daily.  Start taking on: January 29, 2020        CONTINUE taking these medications    acetaminophen 650 MG CR tablet  Commonly known as: TYLENOL 8 HOUR  Take 650 mg by mouth every eight (8) hours as needed for pain.     BD TUBERCULIN SYRINGE 1 mL 27 x 1/2 Syrg  Generic drug: syringe with needle  Use as directed twice weekly     carBAMazepine 400 MG 12 hr tablet  Commonly known as: TEGretol  XR  TAKE 2 TABLETS (800 MG TOTAL) BY MOUTH TWO (2) TIMES A DAY.     diclofenac sodium 1 % gel  Commonly known as: VOLTAREN  Apply 2 g topically daily as needed for arthritis.     empty container Misc  Commonly known as: sharps container  use as directed     folic acid 1 MG tablet  Commonly known as: FOLVITE  TAKE 1 TABLET BY MOUTH EVERY DAY     lactulose 10 gram/15 mL solution  Commonly known as: CONSTULOSE  Take 45 mL (30 g total) by mouth Three (3) times a day.     lidocaine 5 % patch  Commonly known as: LIDODERM  Place 1 patch on the skin.     naproxen 375 MG tablet  Commonly known as: NAPROSYN  TAKE 1 TABLET (375 MG TOTAL) BY MOUTH 2 (TWO) TIMES DAILY WITH A MEAL.     OXBRYTA 500 mg tablet  Generic drug: voxelotor  Take 1 tablet (500 mg total) by mouth daily.     OXYGEN-AIR DELIVERY SYSTEMS MISC  2 L/min by Each Nare route nightly. [employer]     phenytoin 100 MG ER capsule  Commonly known as: DILANTIN  Take 1 capsule in the morning and 2 capsules in the evening     PROCRIT 40,000 unit/mL injection  Generic drug: epoetin alfa  Inject 1 mL (40,000 Units total) under the skin Two (2) times a week.     promethazine 12.5 MG tablet  Commonly known as: PHENERGAN  Take 12.5 mg by mouth every six (6) hours as needed for nausea.     VITAMIN D3-10 mcg (400 unit) 10 mcg (400 unit) Cap  Generic drug: cholecalciferol (vitamin D3-10 mcg (400 unit))  Take 1 capsule (400 Units total) by mouth daily.     zinc sulfate 50 mg zinc (220 mg) capsule  Commonly known as: ZINCATE  Take 1 capsule (220 mg total) by mouth Two (2) times a day for 14 days.          _____________________________________________________________________________  Pending Test Results (if blank, then none):      Most Recent Labs:  Microbiology Results (last day)     ** No results found for the last 24 hours. **          Lab Results   Component Value Date    WBC 6.1 01/28/2020    HGB 6.8 (L) 01/28/2020    HCT 21.0 (L) 01/28/2020    PLT 111 (L) 01/28/2020       Lab Results   Component Value Date    NA 140 01/28/2020    K 4.3 01/28/2020    CL 112 (H) 01/28/2020    CO2 23.0 01/28/2020    BUN 11 01/28/2020    CREATININE 0.66 (L) 01/28/2020    CALCIUM 8.2 (L) 01/28/2020    MG 1.9 06/28/2019    PHOS 4.3 06/28/2019       Lab Results   Component Value Date    ALKPHOS 264 (H) 01/28/2020    BILITOT 7.9 (H) 01/28/2020    BILIDIR 7.80 (H) 01/25/2020    PROT 7.3 01/28/2020    ALBUMIN 2.9 (L) 01/28/2020    ALT 21 01/28/2020    AST 68 (H) 01/28/2020    GGT 357 (H) 01/10/2020       Lab Results   Component Value Date    PT 18.6 (H) 01/25/2020    INR 1.60 01/25/2020    APTT 92.1 (H) 01/26/2020     Hospital Radiology:  ECG 12 Lead    Result Date: 01/26/2020  NORMAL SINUS RHYTHM NORMAL ECG WHEN COMPARED WITH ECG OF 11-Jan-2020 05:31, QRS VOLTAGE IN THE CHEST LEADS HAS DECREASED, NON-SPECIFIC Confirmed by Lorretta Harp [id number]) on 01/26/2020 12:38:49 PM    XR Chest 2 views    Result Date: 01/25/2020  EXAM: XR CHEST 2 VIEWS DATE: 01/25/2020 6:17 PM ACCESSION: [id number] UN DICTATED: 01/25/2020 6:23 PM INTERPRETATION LOCATION: [facility] CLINICAL INDICATION: 49 year old Male with SHORTNESS OF BREATH  COMPARISON: 01/14/2020 TECHNIQUE: PA and Lateral Chest Radiographs. FINDINGS: Hazy retrocardiac/left basilar opacity, likely combination of small pleural effusion and atelectasis. No pneumothorax or right effusion. Stable cardiomegaly.     Small left pleural effusion and likely adjacent atelectasis. Cannot exclude superimposed infection.    CTA Chest W Contrast    Result Date: 01/26/2020  EXAM: CTA CHEST W CONTRAST DATE: 01/26/2020 2:21 PM ACCESSION: [id number] UN DICTATED: 01/26/2020 2:28 PM INTERPRETATION LOCATION: [facility] CLINICAL INDICATION: 49 years old Male with chest pain, elevated D - dimer  COMPARISON: Chest radiograph dated 01/25/2020, CTA of the chest dated 02/02/2015 TECHNIQUE: A helical CTA scan was obtained with IV contrast from the lung apices to the lung bases. Images were reconstructed in the axial plane. Multiplanar reformatted and MIP images were provided. FINDINGS: PULMONARY ARTERIES: Enlarged main pulmonary artery measuring up to 3.4 cm in diameter; there is no right heart strain. Subsegmental pulmonary embolism in the posterior aspect of the left upper lobe (4:79). AIRWAYS, LUNGS, PLEURA: Clear central tracheobronchial tree. Atelectasis of the majority of the left lower lobe. Subsegmental right basilar atelectasis. No lung consolidation.  Moderate left and small right pleural effusions. MEDIASTINUM: Cardiomegaly, similar to prior. No pericardial effusion.  Normal caliber thoracic aorta.  No mediastinal lymphadenopathy. IMAGED ABDOMEN: Diffusely calcified spleen, similar to prior. Moderate volume ascites. Cholelithiasis. SOFT TISSUES: Unremarkable. BONES: Unremarkable.     Left lower lobe subsegmental pulmonary embolism. Enlarged main pulmonary artery, which can be seen in the setting of pulmonary hypertension and/or chronic thromboembolic disease. Moderate left and small right pleural effusions with adjacent passive atelectasis. There is atelectasis of much of the  left lower lobe, however superimposed infection cannot be excluded. 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 Moderate volume ascites. The findings of this study were discussed via Epic message with confirmed receipt with DR. Jovian Lembcke [facility] by Dr. Robynn Pane Maggioncalda on 01/26/2020 3:01 PM. =========================================================================== Addendum by Dr. Burnard Leigh at 3:50 PM on 01/26/2020: Please note that small pulmonary arteries not well opacified, but no definite pulmonary emboli appreciated. 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 Nodular liver contour, along with enlargement of the left lobe of the liver, concerning for chronic liver disease. Findings in the addendum were discussed via telephone with DR. Ena Dawley by Dr. Robynn Pane Maggioncalda on 01/26/2020 4:12 PM.      _____________________________________________________________________________  Discharge Instructions:   Activity Instructions     Activity as tolerated                Other Instructions     Call MD for:  difficulty breathing, headache or visual disturbances      Call MD for:  extreme fatigue      Call MD for:  persistent dizziness or light-headedness      Call MD for:  persistent nausea or vomiting      Call MD for:  severe uncontrolled pain      Call MD for: Temperature > 38.5 Celsius ( > 101.3 Fahrenheit)      Discharge instructions      You were admitted to the hospital with pain in the left side of your chest.    We did a broad work-up to try and figure out what was going on.  We were initially concerned that you might have a blood clot in your lung.  We did a CT scan that confirmed that you did not have any clots.    The CT scan did show findings that were concerning for an pneumonia in the lower lobe of your left lung.    We treated you with antibiotics.  I think this is why the pain in your chest gradually improved.    On discharge we are prescribing an additional 2 days of antibiotics.    We are also starting you on Lasix again which you have taken in the past for fluid building up around your lungs.    It is really important that you take your medications as prescribed    You will also follow-up with Dr. Clarene Duke    Finally it is also very important that you wear your oxygen at night as prescribed.         You were admitted to the hospital with pain in the left side of your chest.    We did a broad work-up to try and figure out what was going on.  We were initially concerned that you might have a blood clot in your lung.  We did a CT scan that confirmed that you did not have any clots.    The CT scan did show findings that were concerning for an pneumonia in the lower lobe of your left lung.    We treated you with antibiotics.  I think this is why the pain in your chest gradually improved.    On discharge we are prescribing an additional 2 days of antibiotics.    We are also starting you on Lasix again which you have taken in  the past for fluid building up around your lungs.    It is really important that you take your medications as prescribed    You will also follow-up with Dr. Clarene Duke    Finally it is also very important that you wear your oxygen at night as prescribed.          Follow Up instructions and Outpatient Referrals     Call MD for:  difficulty breathing, headache or visual disturbances      Call MD for:  extreme fatigue      Call MD for:  persistent dizziness or light-headedness      Call MD for:  persistent nausea or vomiting      Call MD for:  severe uncontrolled pain      Call MD for: Temperature > 38.5 Celsius ( > 101.3 Fahrenheit)      Discharge instructions      You were admitted to the hospital with pain in the left side of your chest.    We did a broad work-up to try and figure out what was going on.  We were initially concerned that you might have a blood clot in your lung.  We did a CT scan that confirmed that you did not have any clots.    The CT scan did show findings that were concerning for an pneumonia in the lower lobe of your left lung.    We treated you with antibiotics.  I think this is why the pain in your chest gradually improved.    On discharge we are prescribing an additional 2 days of antibiotics.    We are also starting you on Lasix again which you have taken in the past for fluid building up around your lungs.    It is really important that you take your medications as prescribed    You will also follow-up with Dr. Clarene Duke    Finally it is also very important that you wear your oxygen at night as prescribed.          Appointments which have been scheduled for you    Feb 20, 2020 12:00 PM  (Arrive by 11:45 AM)  RETURN  SICKLE CELL with Julious Oka, MD  [facility]ATOLOGY [facility] [place] [facility]) [address]  [place] [address]  [phone]      Mar 06, 2020  8:20 AM  (Arrive by 8:05 AM)  RETURN  HEPATOLOGY with Annie Paras, MD  [facility] GI MEDICINE [place] [facility]) [address]  [place] [address]  [phone]      Mar 21, 2020 12:30 PM  (Arrive by 12:15 PM)  RETURN  PULM HYPERTENSION with Zannie Cove, MD  [facility] PULMONARY SPECIALTY CL [place] [facility]) [address]  [place] [address]  [phone]

## 2020-01-28 NOTE — Unmapped (Signed)
Problem: Adult Inpatient Plan of Care  Goal: Plan of Care Review  Outcome: Progressing  Goal: Patient-Specific Goal (Individualization)  Outcome: Progressing  Goal: Absence of Hospital-Acquired Illness or Injury  Outcome: Progressing  Goal: Optimal Comfort and Wellbeing  Outcome: Progressing  Goal: Readiness for Transition of Care  Outcome: Progressing  Goal: Rounds/Family Conference  Outcome: Progressing     Problem: Pain Acute  Goal: Optimal Pain Control  Outcome: Progressing     Problem: Seizure Disorder Comorbidity  Goal: Maintenance of Seizure Control  Outcome: Progressing

## 2020-01-28 NOTE — Unmapped (Signed)
Pt admitted with the complaint of left sided chest pain. Telemetry monitoring in place. No calls from central telemetry related to EKG changes. Vital signs stable. Afebrile. Stable on room air. 2 liters [place] at bedtime.   Pt with complaint of some mild right sided chest pain. Covering MED H Provider made aware and ordered Oxycodone times one with relief. Pt remains on IV and oral ABX. Continue to monitor and notify the Provider of any acute changes in patient condition.       Problem: Adult Inpatient Plan of Care  Goal: Plan of Care Review  01/28/2020 0447 by Eugenie Filler, RN  Outcome: Progressing  Flowsheets (Taken 01/28/2020 0447)  Progress: improving  Plan of Care Reviewed With: patient  01/28/2020 0442 by Eugenie Filler, RN  Outcome: Progressing  Flowsheets (Taken 01/28/2020 0427)  Progress: improving  Plan of Care Reviewed With: patient  Goal: Patient-Specific Goal (Individualization)  01/28/2020 0447 by Eugenie Filler, RN  Outcome: Progressing  Flowsheets (Taken 01/28/2020 0447)  Patient-Specific Goals (Include Timeframe):   Adequate improvement in BLE swelling   Stable vital signs, Free of left sided chest pain  Individualized Care Needs:   Lasix daily, fluid restriction   Falls precautions, Vital signs stable, [place] at 2 liters at bedtime, Telemetry, pain management  Anxieties, Fears or Concerns: Pt denied any concerns this shift.  01/28/2020 0442 by Eugenie Filler, RN  Outcome: Progressing  Goal: Absence of Hospital-Acquired Illness or Injury  01/28/2020 0447 by Eugenie Filler, RN  Outcome: Progressing  01/28/2020 0442 by Eugenie Filler, RN  Outcome: Progressing  Goal: Optimal Comfort and Wellbeing  01/28/2020 0447 by Eugenie Filler, RN  Outcome: Progressing  01/28/2020 0442 by Eugenie Filler, RN  Outcome: Progressing  Goal: Readiness for Transition of Care  01/28/2020 0447 by Eugenie Filler, RN  Outcome: Progressing  01/28/2020 0442 by Eugenie Filler, RN  Outcome: Progressing  Goal: Rounds/Family Conference  01/28/2020 0447 by Eugenie Filler, RN  Outcome: Progressing  01/28/2020 0442 by Eugenie Filler, RN  Outcome: Progressing     Problem: Pain Acute  Goal: Optimal Pain Control  01/28/2020 0447 by Eugenie Filler, RN  Outcome: Progressing  01/28/2020 0442 by Eugenie Filler, RN  Outcome: Progressing     Problem: Seizure Disorder Comorbidity  Goal: Maintenance of Seizure Control  01/28/2020 0447 by Eugenie Filler, RN  Outcome: Progressing  01/28/2020 0442 by Eugenie Filler, RN  Outcome: Progressing     Problem: Fall Injury Risk  Goal: Absence of Fall and Fall-Related Injury  01/28/2020 0447 by Eugenie Filler, RN  Outcome: Progressing  01/28/2020 0442 by Eugenie Filler, RN  Outcome: Progressing

## 2020-01-29 MED ORDER — FUROSEMIDE 20 MG TABLET
ORAL_TABLET | Freq: Every day | ORAL | 0 refills | 30 days | Status: CP
Start: 2020-01-29 — End: 2020-02-28
  Filled 2020-01-28: qty 30, 30d supply, fill #0

## 2020-01-29 NOTE — Unmapped (Signed)
-----   Message from Arlester Marker, MD sent at 01/28/2020  6:22 PM EDT -----  Regarding: RE: transfusable?  We do not recommend a chronic RBC transfusion program for this patient.  Please refer to Dr. Claudine Mouton consult note dated 12/07/2018:    Recommendations from 12/07/2018: Transfusion should be avoided if at all possible. If transfusion is required, the least incompatible units which are phenotypically matched to the patient's Red Blood Cell antigens will be provided.  A emergency release card must be signed by the treating physician to release the Red Blood Cell units from Transfusion Medicine Service for transfusion. During and after the transfusion, the patient should be monitored for the potential of hemolysis.    Please let us know if you have further questions.      Thanks!  Matt  ----- Message -----  From: Zella Ball  Sent: 01/28/2020   1:08 PM EDT  To: Julious Oka, MD, #  Subject: transfusable?                                    HI,  We are planning to try and treat [name] sickle cell disease with 2 units PRBC every month. How feasible is this plan with his antibody status?   Thanks,  [employer]

## 2020-02-05 NOTE — Unmapped (Signed)
Transitions Social Worker at the [facility]   (outpatient):     1:23pm   SWer attempted to call pt today for Transitions of Care follow up @ [phone] (home) . Patient did not answer. SWer left a message explaining reason for call and provided SWer's contact information and requested a call back.     Rockwell Alexandria, MSW, LCSW  Clinical Social Worker  [facility] -Cardiology & Pulmonary Services  Office: [phone]  [email or web address]

## 2020-02-08 NOTE — Unmapped (Signed)
Transitions Social Worker at the [facility]   (outpatient):     SWer made second attempt to call patient at [phone] (home) for Transitions of Care follow up. Patient did not answer. SWer left a voicemail explaining reason for call and requested a call back.  SWer will close services but will remain available should needs arise.    Rockwell Alexandria, MSW, LCSW  Transitions Social Worker  [facility]  Office: [phone]  [email or web address] [email or web address]

## 2020-02-14 NOTE — Unmapped (Signed)
[facility] Clinical Assessment & Refill Coordination Note    [name] reports swelling in legs/ankles starting 2 weeks ago.  He denies any sudden weight gain, pain, tenderness, redness.  He has tried to sit and elevate legs on pillow but does not help decrease swelling.  Swelling goes down overnight and progressively worsens throughout day especially after walking around.  He denies any significant changes in his diet or fluid intake recently.  Routing message to CPP and provider for follow up if needed.    [name] [name], DOB: [birth year]  Phone: [phone] (home)     All above HIPAA information was verified with patient.     Was a Nurse, learning disability used for this call? No    Specialty Medication(s):   Hematology/Oncology: [name] [name] and Procrit     Current Outpatient Medications   Medication Sig Dispense Refill   ??? acetaminophen (TYLENOL 8 HOUR) 650 MG CR tablet Take 650 mg by mouth every eight (8) hours as needed for pain.     ??? carBAMazepine (TEGRETOL  XR) 400 MG 12 hr tablet TAKE 2 TABLETS (800 MG TOTAL) BY MOUTH TWO (2) TIMES A DAY. 120 tablet 11   ??? cefdinir (OMNICEF) 300 MG capsule Take 1 capsule (300 mg total) by mouth Two (2) times a day. 4 capsule 0   ??? cholecalciferol, vitamin D3, (VITAMIN D3) 10 mcg (400 unit) cap Take 1 capsule (400 Units total) by mouth daily. 30 each 0   ??? diclofenac sodium (VOLTAREN) 1 % gel Apply 2 g topically daily as needed for arthritis. 100 g prn   ??? empty container (SHARPS CONTAINER) Misc use as directed 1 each 2   ??? epoetin alfa (PROCRIT) 40,000 unit/mL injection Inject 1 mL (40,000 Units total) under the skin Two (2) times a week. 8 mL 3   ??? folic acid (FOLVITE) 1 MG tablet TAKE 1 TABLET BY MOUTH EVERY DAY 30 tablet 11   ??? furosemide (LASIX) 20 MG tablet Take 1 tablet (20 mg total) by mouth daily. 30 tablet 0   ??? lactulose (CONSTULOSE) 10 gram/15 mL solution Take 45 mL (30 g total) by mouth Three (3) times a day. 4050 mL 3   ??? lidocaine (LIDODERM) 5 % patch Place 1 patch on the skin.     ??? naproxen (NAPROSYN) 375 MG tablet TAKE 1 TABLET (375 MG TOTAL) BY MOUTH 2 (TWO) TIMES DAILY WITH A MEAL.     ??? OXYGEN-AIR DELIVERY SYSTEMS MISC 2 L/min by Each Nare route nightly. [employer]     ??? phenytoin (DILANTIN) 100 MG ER capsule Take 1 capsule in the morning and 2 capsules in the evening 90 capsule 11   ??? promethazine (PHENERGAN) 12.5 MG tablet Take 12.5 mg by mouth every six (6) hours as needed for nausea.     ??? syringe with needle (BD TUBERCULIN SYRINGE) 1 mL 27 x 1/2 Syrg Use as directed twice weekly 24 each 3   ??? voxelotor (OXBRYTA) 500 mg tablet Take 1 tablet (500 mg total) by mouth daily. (Patient not taking: Reported on 10/10/2019) 30 tablet 11     No current facility-administered medications for this visit.        Changes to medications: [name] [name] reports no changes at this time.    No Known Allergies    Changes to allergies: No    SPECIALTY MEDICATION ADHERENCE     Oxbryta 500 mg: 8 days of medicine on hand   Procrit 40,000 units/mL: 14 days  of medicine on hand     Medication Adherence    Patient reported X missed doses in the last month: 0  Specialty Medication: Oxbryta 500 mg - daily  Patient is on additional specialty medications: Yes  Additional Specialty Medications: Procrit 40,000 unit/mL  Patient Reported Additional Medication X Missed Doses in the Last Month: 3-4  Informant: patient  Confirmed plan for next specialty medication refill: delivery by pharmacy          Specialty medication(s) dose(s) confirmed: Regimen is correct and unchanged.     Are there any concerns with adherence? No    Adherence counseling provided? Not needed    CLINICAL MANAGEMENT AND INTERVENTION      Clinical Benefit Assessment:    Do you feel the medicine is effective or helping your condition? Yes    Clinical Benefit counseling provided? Not needed    Adverse Effects Assessment:    Are you experiencing any side effects? Yes, patient reports experiencing swelling in feet/ankles.  . Side effect counseling provided: Elevate legs when seated.  Pt reports swelling decreases overnight but worsens throughout the day especially when walking.  Does not help much when elevates legs during day.    Are you experiencing difficulty administering your medicine? No    Quality of Life Assessment:    How many days over the past month did your Sickle cell disease  keep you from your normal activities? For example, brushing your teeth or getting up in the morning. 0    Have you discussed this with your provider? No - pharmacist will consult provider    Therapy Appropriateness:    Is therapy appropriate? Yes, therapy is appropriate and should be continued    DISEASE/MEDICATION-SPECIFIC INFORMATION      For patients on injectable medications: Patient currently has 5 doses left.  Next injection is scheduled for injects twice weekly.    PATIENT SPECIFIC NEEDS     - Does the patient have any physical, cognitive, or cultural barriers? No    - Is the patient high risk? No     - Does the patient require a Care Management Plan? No     - Does the patient require physician intervention or other additional services (i.e. nutrition, smoking cessation, social work)? No      SHIPPING     Specialty Medication(s) to be Shipped:   Hematology/Oncology: [name] [name] and Procrit    Other medication(s) to be shipped: none - declined refill for syringes     Changes to insurance: No    Delivery Scheduled: Yes, Expected medication delivery date: Oxbryta on 02/19/20 and Procrit on 02/28/20.     Medication will be delivered via Same Day Courier to the confirmed prescription address in [facility].    The patient will receive a drug information handout for each medication shipped and additional FDA Medication Guides as required.  Verified that patient has previously received a Conservation officer, historic buildings.    All of the patient's questions and concerns have been addressed.    [name] [facility] Specialty Pharmacist

## 2020-02-19 MED FILL — OXBRYTA 500 MG TABLET: 30 days supply | Qty: 30 | Fill #3 | Status: AC

## 2020-02-19 MED FILL — OXBRYTA 500 MG TABLET: ORAL | 30 days supply | Qty: 30 | Fill #3

## 2020-02-20 ENCOUNTER — Ambulatory Visit: Admit: 2020-02-20 | Discharge: 2020-02-20 | Payer: MEDICARE

## 2020-02-20 ENCOUNTER — Ambulatory Visit: Admit: 2020-02-20 | Discharge: 2020-02-20 | Payer: MEDICARE | Attending: Hematology | Primary: Hematology

## 2020-02-20 DIAGNOSIS — R569 Unspecified convulsions: Principal | ICD-10-CM

## 2020-02-20 DIAGNOSIS — D5700 Hb-SS disease with crisis, unspecified: Principal | ICD-10-CM

## 2020-02-20 DIAGNOSIS — D571 Sickle-cell disease without crisis: Principal | ICD-10-CM

## 2020-02-20 DIAGNOSIS — I5083 High output heart failure: Principal | ICD-10-CM

## 2020-02-20 LAB — COMPREHENSIVE METABOLIC PANEL
ALBUMIN: 2.9 g/dL — ABNORMAL LOW (ref 3.4–5.0)
ALKALINE PHOSPHATASE: 307 U/L — ABNORMAL HIGH (ref 46–116)
ANION GAP: 6 mmol/L (ref 3–11)
AST (SGOT): 144 U/L — ABNORMAL HIGH (ref <34)
BILIRUBIN TOTAL: 13.3 mg/dL — ABNORMAL HIGH (ref 0.3–1.2)
BLOOD UREA NITROGEN: 12 mg/dL (ref 9–23)
CHLORIDE: 108 mmol/L — ABNORMAL HIGH (ref 98–107)
CO2: 20.3 mmol/L (ref 20.0–31.0)
CREATININE: 0.87 mg/dL (ref 0.60–1.10)
EGFR CKD-EPI AA MALE: >90 mL/min/{1.73_m2}
EGFR CKD-EPI NON-AA MALE: >90 mL/min/{1.73_m2}
GLUCOSE RANDOM: 88 mg/dL (ref 70–179)
PROTEIN TOTAL: 8.2 g/dL (ref 5.7–8.2)
SODIUM: 134 mmol/L — ABNORMAL LOW (ref 135–145)

## 2020-02-20 LAB — BILIRUBIN DIRECT: Bilirubin.glucuronidated+Bilirubin.albumin bound:MCnc:Pt:Ser/Plas:Qn:: 7.7 — ABNORMAL HIGH

## 2020-02-20 LAB — SODIUM: Sodium:SCnc:Pt:Ser/Plas:Qn:: 134 — ABNORMAL LOW

## 2020-02-20 LAB — PHENYTOIN LEVEL: Phenytoin:MCnc:Pt:Ser/Plas:Qn:: 7.5 — ABNORMAL LOW

## 2020-02-20 LAB — LACTATE DEHYDROGENASE: Lactate dehydrogenase:CCnc:Pt:Ser/Plas:Qn:: >750 — ABNORMAL HIGH

## 2020-02-20 MED ORDER — FUROSEMIDE 20 MG TABLET
ORAL_TABLET | Freq: Every day | ORAL | 0 refills | 30.00000 days | Status: CP
Start: 2020-02-20 — End: 2020-03-21

## 2020-02-20 NOTE — Unmapped (Signed)
[facility] Follow-up Note      Primary Care Physician:   Fredirick Maudlin, MD    Reason for Visit:  Follow up sickle cell anemia, Hgb SS    Assessment/Recommendations:   [name] [name] is a 49 y.o. year old African-American male who returns for follow-up of sickle cell anemia.      1.  Sickle cell anemia, Hgb SS: Severe anemia with macrocytosis. Hgb nadir of 4.5 12/08/18 with typical baseline range of 5.5-6.5 g/dL. Has been compliant with Folic acid, EPO 40k units twice weekly (missed a few doses during hospitalizations) as well as voxeletor 500 mg daily, but has not noted any subjective improvement in his symptoms. To the contrary his energy level and overall performance status has declined in recent months in the setting of worsening congestive hepatopathy and hospitalizations in May and early June 2021. He was transfused 2 units of prbc's during his May hospitalization prior to a transjugular liver bx. Hb responded with an increase to 9.0 but has since trickled back down to known BL. Bx results as follows:  overall appearance of the liver with congestion, sinusoidal fibrosis, and hepatocyte atrophy are suggestive of outflow obstruction and are likely related to/exacerbated by the patient's history of sickle cell anemia. Full report below. On exam he has new onset ascites and bilateral LE pitting edema to the knees. June hospitalization for ACS with improvement in symptoms after 48 hrs of supportive care and abx which were completed in the outpatient setting.  Pain continues to be well controlled without the need for medications.      - Continue with folic acid daily  - Continue voxelotor 500mg  daily  - Continue epo 40K units twice weekly- has missed a few doses ([facility])  - Continue to hold hydroxyurea for now  - CBC, CMP, Direct Bili, LDH, Haptoglobin, Retics, Tegretol and Phenytoin levels, Smear today    2. Pulmonary HTN / Hypoxia: Wearing his oxygen more consistently at night now that he has received a new machine from his oxygen company.  Reiterated  the importance of treating his nocturnal hypoxia to avoid end organ toxicity in light of his severe anemia. Most recent ECHO from 01/10/20: LVEF 60-65%, normal RV function, moderate PHTN with PASP 46 mm Hg, mildly dilated RA, mildly elevated RA pressure 5-10 mm Hg  - Continue oxygen as prescribed. Currently prescribed 2-4L at night; adherence improving.   - Can use vaseline around the nares for dryness while on oxygen  -F/u with pulmonology in July as scheduled    3. Congestive hepatopathy: Pt with chronic hyperbilirubinemia with recent exacerbation, appearance c/w congestion on biopsy, evidence of portal hypertension on imaging and new onset ascites. He has been taking lasix 20 mg daily with minimal change in urine output. Aldactone not initiated, presumably 2/2 history of hyperkalemia for which he takes Kayexalate EOD.   -Will increase Lasix to 40 mg daily today  -F/u with hepatology in July as scheduled    4. Proteinuria with intermittent hyperkalemia and low blood pressure in the past: Patient not a candidate for ACE inhibitor or ARB at present.   Follows outpatient with Nephrology (last seen 09/04/19) with no additional interventions recommended at this time. He is using his Kayexelate EOD and will need to monitor his potassium levels closely given issues in the past (notes that he is using prn when he has foods higher in potassium).   - f/u K levels on pending labs  5. Unsteadiness, falls: Resolved. Work-up had been notable for supratherapeutic phenytoin levels, that have now normalized with dose reduction of his phenytoin. No further symptoms since that time. He also continues on his carbamazepine. Will check repeat phenytoin and tegretol levels today to ensure patient remains therapeutic. Will discuss with Neurology team whether transitioning to a single drug regimen (ie Keppra) might be reasonable to limit pill burden and also reduce the potential for medication induced hepatotoxicity. He has however been seizure free over the last few years on this regimen.   - f/u total and free phenytoin levels  - consider transitioning off carbamazepine and phenytoin    - Will reach out to neurology team as pt has not been seen in several months and has transportation issues.    6. Health maintenance:   Immunization History   Administered Date(s) Administered   ??? COVID-19 VACC,MRNA,(PFIZER)(PF)(IM) 11/17/2019, 12/14/2019   ??? Hepatitis B, Adult 03/25/2009, 04/24/2009   ??? INFLUENZA TIV (TRI) PF (IM) 07/21/2009, 09/12/2012   ??? Influenza Vaccine Quad (IIV4 PF) 64mo+ injectable 07/04/2015, 05/07/2016, 04/20/2018, 06/27/2019   ??? Influenza Virus Vaccine, unspecified formulation 10/11/2018   ??? Meningococcal C Conjugate 03/27/2013   ??? PNEUMOCOCCAL POLYSACCHARIDE 23 03/25/2009, 11/11/2017   ??? Pneumococcal Conjugate 13-Valent 07/04/2015   Hep B sAb neg in 2017 (although previously immunized?). Untransfusable, so may not be a priority at present.  Will also need to think about starting Twinrix series in near future.     7. Seizures, last time 3-4 years ago, still on Tegretol and Dilantin. Last levels therapeutic though phenytoin at the ULN. Discussed as above. Pts requires f/u with neurology to discuss.    7. Erectile dysfunction: Previously endorsed issues with erectile dysfunction, but no issues currently (or with priapism).     8. Right sided hearing loss: Reports symptoms over the course of the past month, though recent audiology testing reassuring with only evidence of stable, mild sensorineural hearing loss on right side (comparable to 2018 testing). He does have some cerumen buildup that may be contributing. Patient to discuss with local providers possibility of ear irrigation. If unable to do so will plan on referral to ENT.     FOLLOW UP: 2 weeks, video    Patient seen and discussed with Dr. Clarene Duke.     [name] Rankin, MD  Hematology/Oncology Fellow    History of Present Illness:   [name] [name] is a 49 y.o. year old African-American male with Hgb SS who is here for follow up. His disease is characterized by severe anemia and difficult to transfuse due to alloantibodies. Has not tolerated disease modifying therapy in the past, though re-started volexotor since his last visit (08/15/19) which patient has tolerated thus far without issue.     He reports since he was seen last he has been feeling much better, especially now that his phenytoin dose has been modified. He no longer feels as drowsy and his ambulation has improved with no recent falls. He continues on his Procrit twice weekly but does not like the injections and is hoping he can come off at some point. He has not missed any recent doses. He has also been taking his volexotor as prescribed and has not had any GI symptoms as of yet. He stopped his hydrea when he started the volexotor in the middle of January. He has not had any issues with pain since last seen. Reports that he has been using his nighttime oxygen regularly as he was able to get  a new bubbler which isn't as noisy. He does not feel as though it has improved his sleep significantly. He denies any fever, chills, or other infectious symptoms.      Interim Hx: Since or last encounter pt has been admitted to [facility]  From 5/19 to 01/18/2020 with abdominal pain, nausea and vomiting, and hyperbilirubinemia. On admission, TBili 10.4, Dbili 6.6, AST 103, ALT 24, AlkPhos 285, GGT 357, mixed cholestatic/hepatic pattern. ProBNP 795, carbamazepine level WNL, phenytoin level low 6.6. Abd U/S +gallstones, but indeterminate for cholecystitis and with echogenic, nodular pattern suggestive of chronic liver disease. Echo with severely dilated LA, mod-severely dilated RA, mod pulm HTN, PASP 46. MRCP with cholelithiasis and GB wall thickening, mild dilation of CBD with distal narrowing but no definite e/o obstruction, no intrahepatic biliary duct dilation, hepatomegaly with nodule liver contour. Biliary was consulted and recommended EUS +/- ERCP which did not reveal any obstructive process.  ??  Hepatology consulted and recommended hepatitis serologies, auto-immune workup and transjugular biopsy, performed by [facility] 01/14/20. Received 2 units PRBC's on 01/13/2020 prior to procedure. Viral panel (HIV, HAV, HBV ab and quant negative, HCV negative). Autoimmune work-up unremarkable (ANA, anti-SM, liver/kidney microsome negative.)  Results from biopsy showed prominent congestion with mild-moderate cholestasis as well as robust perisinusoidal fibrosis without evidence of steatosis. Overall read was that these findings are consistent with exacerbation from his sickle cell disease. During the TJ biopsy, simultaneous measurement of hepatic wedge pressure demonstrated elevation consistent with portal hypertension.    Hospitalized again from 6/4/-01/28/2020 for ACS. No ischemic ECG changes to suggest acute coronary syndrome.??Empirically covered with ceftriaxone & azithromycin for acute chest coverage with??CT??findings??& sickle cell history??and patient improved dramatically within 48 hours. Discharged on cefdinir and azithro to complete 5 day course.    At today's visit pt states that he is persistently weak, dizzy and has increasing abdominal distension. 6 mo ago he could walk around the yard, perform household chores, but now spends to vast majority of his day resting/sitting on couch. Appetite has decreased. Some mild nausea and occasional emesis with PO intake. He is using kayexalate, but this results in dyspepsia, stomach cramping followed by small volume loose stools. UOP has decreased as well, no straining or hematuria.     Sickle Cell Disease History:  Access: peripheral IV  1.  Genotype: SS (hemolysis: hemoglobin 6 , ARC 100-200 , LDH 3000-5000 , Bilirubin 3-5 , urobilinogen 0.2-2 )  Concomitant alpha thal: ?  Other genetic studies: no  BMT?:  no  Folic acid: yes  Hydroxyurea: was on 500mg /day but with mild neutropenia (1200) and minimal F elevation; patient with h/o telling different stories to different providers - in setting of neutropenia and renal dysfunction and minimal improvement in %F, we have not resumed Hydrea.  Started on Voxelator on 7/8 for hyperhemolysis, but discontinued per pt request secondary to nausea/vomiting. Had re-started Hydrea (500mg /day) 08/15/19 but discontinued 09/11/19 upon start of Volexotor (500mg  daily) which patient continues on at present.   Voxelator 500mg  daily  Studies/protocols: no   2. Pain (typical location): rare - legs, feet  Hospitalization frequency:  rare  Outpatient regimen: tylenol  24h Morphine Equivalent Dose (Goal <100): 0  Bowel regimen: none  Inpatient regimen: ?   2. Transfusions: yes per previous notes but on 11/11/17 patient said 'never'  Life time blood transfusions: 6+  Last transfusion: 02/2018   History of DHTR?: unlikely  Alloantibodies: Yes  Iron status: ferritin 173 01/10/20  Medications: EPO 40,000 units twice weekly  3.  CV/Pulm: chronic hypoxia ( improved on HU per previous notes), supposed to be on o2 at night (2LPM); on 11/11/17 states he can 'walk 1/2 mile without getting short of breath'. Unable to complete PFTs on 03/21/19 due to in office fall. More consistent use of nightly oxygen of late.  Acute chest syndrome: Yes, June 2021  Need for mechanical ventilation?: no  Pulmonary hypertension: yes, h/o hypoxia - NYHA 2 and ? WHO group 5; RHC 06/2017 - mild PxHTN with PAmean = 26, PCW mean of 16 and increased CO, normal PVR 1.2 wood units      4. Neuro: Hx of seizure DO  Stroke/TIA: no  Imaging:     Other: epilepsy diagnosed at 49yo, on carbamazepine and phenytoin; follows regularly with neurology   5. Psych: cognitive delay  Concerns for depression/anxiety?: no  Concerns for substance dependence?: no  Medications:    6. ID/Immune:  Splenectomy?: no  Antibiotics: no  Immunization history:   Immunization History   Administered Date(s) Administered   ??? COVID-19 VACC,MRNA,(PFIZER)(PF)(IM) 11/17/2019, 12/14/2019   ??? Hepatitis B, Adult 03/25/2009, 04/24/2009   ??? INFLUENZA TIV (TRI) PF (IM) 07/21/2009, 09/12/2012   ??? Influenza Vaccine Quad (IIV4 PF) 71mo+ injectable 07/04/2015, 05/07/2016, 04/20/2018, 06/27/2019   ??? Influenza Virus Vaccine, unspecified formulation 10/11/2018   ??? Meningococcal C Conjugate 03/27/2013   ??? PNEUMOCOCCAL POLYSACCHARIDE 23 03/25/2009, 11/11/2017   ??? Pneumococcal Conjugate 13-Valent 07/04/2015     ANA: positive 1:160 on 06/2017  HIV neg 06/2017  Hep BsAb neg 04/2016   7. GI/Hep:  Cholecystectomy?: no  Hepatopathy?: Congestive hepatopathy with portal HTN, ascites   Other: no   8. 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 GU:  Priapism?: yes, remote - last at 49yo per notes   10. M/sk:  AVN?: no  Ulcers?: no  Other: left iliac crest fracture (? 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 Endocrine:  Bone/vitamin D: on Vitamin D supplementation   Other:    13. Screening:   Ophtho/Retinopathy?: neg 09/2017  Audiology?: yes  Dental?: yes   14. Social: watches TV all day, lives alone, drives  Education: high school   Work/Disability:   Smoking: cigars 2x/day  Alcohol:   Other:      Surgical History:  Past Surgical History:   Procedure Laterality Date   ??? PR ENDOSCOPIC US EXAM, ESOPH N/A 01/11/2020    Procedure: UGI ENDOSCOPY; WITH ENDOSCOPIC ULTRASOUND EXAMINATION LIMITED TO THE ESOPHAGUS;  Surgeon: Jules Husbands, MD;  [facility];  Service: Gastroenterology   ??? PR RIGHT HEART CATH O2 SATURATION & CARDIAC OUTPUT N/A 07/18/2017    Procedure: Right Heart Catheterization;  Surgeon: Zannie Cove, MD;  Location[facility] C[facility];  Service: Cardiology     Medications:   Current Outpatient Medications   Medication Sig Dispense Refill   ??? acetaminophen (TYLENOL 8 HOUR) 650 MG CR tablet Take 650 mg by mouth every eight (8) hours as needed for pain.     ??? carBAMazepine (TEGRETOL  XR) 400 MG 12 hr tablet TAKE 2 TABLETS (800 MG TOTAL) BY MOUTH TWO (2) TIMES A DAY. 120 tablet 11   ??? cefdinir (OMNICEF) 300 MG capsule Take 1 capsule (  300 mg total) by mouth Two (2) times a day. 4 capsule 0   ??? cholecalciferol, vitamin D3, (VITAMIN D3) 10 mcg (400 unit) cap Take 1 capsule (400 Units total) by mouth daily. 30 each 0   ??? diclofenac sodium (VOLTAREN) 1 % gel Apply 2 g topically daily as needed for arthritis. 100 g prn   ??? empty container (SHARPS CONTAINER) Misc use as directed 1 each 2   ??? epoetin alfa (PROCRIT) 40,000 unit/mL injection Inject 1 mL (40,000 Units total) under the skin Two (2) times a week. 8 mL 3   ??? folic acid (FOLVITE) 1 MG tablet TAKE 1 TABLET BY MOUTH EVERY DAY 30 tablet 11   ??? furosemide (LASIX) 20 MG tablet Take 1 tablet (20 mg total) by mouth daily. 30 tablet 0   ??? lactulose (CONSTULOSE) 10 gram/15 mL solution Take 45 mL (30 g total) by mouth Three (3) times a day. 4050 mL 3   ??? lidocaine (LIDODERM) 5 % patch Place 1 patch on the skin.     ??? naproxen (NAPROSYN) 375 MG tablet TAKE 1 TABLET (375 MG TOTAL) BY MOUTH 2 (TWO) TIMES DAILY WITH A MEAL.     ??? OXYGEN-AIR DELIVERY SYSTEMS MISC 2 L/min by Each Nare route nightly. [employer]     ??? phenytoin (DILANTIN) 100 MG ER capsule Take 1 capsule in the morning and 2 capsules in the evening 90 capsule 11   ??? promethazine (PHENERGAN) 12.5 MG tablet Take 12.5 mg by mouth every six (6) hours as needed for nausea.     ??? syringe with needle (BD TUBERCULIN SYRINGE) 1 mL 27 x 1/2 Syrg Use as directed twice weekly 24 each 3   ??? voxelotor (OXBRYTA) 500 mg tablet Take 1 tablet (500 mg total) by mouth daily. (Patient not taking: Reported on 10/10/2019) 30 tablet 11     No current facility-administered medications for this visit.     Allergies:  Patient has no known allergies.    Social History:  Social History     Socioeconomic History   ??? Marital status: Single     Spouse name: Not on file   ??? Number of children: 0   ??? Years of education: Not on file   ??? Highest education level: Not on file   Occupational History   ??? Occupation: disability   Tobacco Use   ??? Smoking status: Current Every Day Smoker     Types: Cigars   ??? Smokeless tobacco: Never Used   ??? Tobacco comment: 1 cigar daily   Vaping Use   ??? Vaping Use: Never used   Substance and Sexual Activity   ??? Alcohol use: Not Currently     Alcohol/week: 0.0 standard drinks     Comment: occasional   ??? Drug use: No   ??? Sexual activity: Not on file   Other Topics Concern   ??? Not on file   Social History Narrative   ??? Not on file     Social Determinants of Health     Financial Resource Strain: Low Risk    ??? Difficulty of Paying Living Expenses: Not hard at all   Food Insecurity: No Food Insecurity   ??? Worried About Programme researcher, broadcasting/film/video in the Last Year: Never true   ??? Ran Out of Food in the Last Year: Never true   Transportation Needs: No Transportation Needs   ??? Lack of Transportation (Medical): No   ??? Lack of Transportation (Non-Medical): No  Physical Activity:    ??? Days of Exercise per Week:    ??? Minutes of Exercise per Session:    Stress:    ??? Feeling of Stress :    Social Connections:    ??? Frequency of Communication with Friends and Family:    ??? Frequency of Social Gatherings with Friends and Family:    ??? Attends Religious Services:    ??? Database administrator or Organizations:    ??? Attends Engineer, structural:    ??? Marital Status:      Family History:  family history includes Clotting disorder in his paternal grandmother; Hypertension in his father and mother; No Known Problems in his brother, brother, brother, brother, maternal aunt, maternal grandfather, maternal grandmother, maternal uncle, paternal aunt, paternal grandfather, paternal uncle, sister, sister, and another family member; Sickle cell anemia in his sister.   He indicated that his mother is alive. He indicated that his father is alive. He indicated that two of his three sisters are alive. He indicated that all of his four brothers are alive. He indicated that the status of his maternal grandmother is unknown. He indicated that the status of his maternal grandfather is unknown. He indicated that the status of his paternal grandmother is unknown. He indicated that the status of his paternal grandfather is unknown. He indicated that the status of his maternal aunt is unknown. He indicated that the status of his maternal uncle is unknown. He indicated that the status of his paternal aunt is unknown. He indicated that the status of his paternal uncle is unknown. He indicated that the status of his neg hx is unknown. He indicated that the status of his other is unknown.    Review of Systems:  As per HPI, otherwise negative x 10 systems.    Objective :   Vitals:    02/20/20 1226   BP: 106/51   BP Site: L Arm   BP Position: Sitting   BP Cuff Size: Medium   Pulse: 56   Resp: 14   Temp: 36.7 ??C (98 ??F)   TempSrc: Temporal   SpO2: 97%   Weight: 70.5 kg (155 lb 6 oz)   Height: 167.6 cm (5' 6)     Physical Exam:  Gen: A/o x 3, fatigued, chronically ill appearing but NAD  HEENT: Scleral and sublingual icterus. MMM, no oral ulcers  CV: Normal rate, regular rhythm, systolic murmur appreciated.  Symmetric bilateral 1+ LE pitting edema to knee  Pulm: CTAB.   GI - +BS,  Soft, Mildly TTP in bilateral upper quadrants w/o guarding or rebound, distended  Skin - No rashes or lesions.   Ext: Clubbing in fingers.  No joint effusions or tenderness on palpation. FROM of joints.   Neuro - Alert, oriented. No focal deficits on exam. Unsteady gait, braces with hands/upper extremities when standing, changing direction  Psych - Depressed mood/frustration, responds appropriately to questions, makes good eye contact, linear thought.    Baseline MOCA self assessment was 37/40.     Test Results:  Results for orders placed or performed in visit on 02/20/20   Lactate dehydrogenase   Result Value Ref Range    LDH >750 (H) 120 - 246 U/L   Bilirubin, Direct   Result Value Ref Range    Bilirubin, Direct 7.70 (H) 0.00 - 0.30 mg/dL   Comprehensive Metabolic Panel   Result Value Ref Range    Sodium 134 (L) 135 - 145 mmol/L  Potassium      Chloride 108 (H) 98 - 107 mmol/L    Anion Gap 6 3 - 11 mmol/L    CO2 20.3 20.0 - 31.0 mmol/L    BUN 12 9 - 23 mg/dL    Creatinine 1.61 0.96 - 1.10 mg/dL    BUN/Creatinine Ratio 14     EGFR CKD-EPI Non-African American, Male >90 mL/min/1.23m2    EGFR CKD-EPI African American, Male >90 mL/min/1.84m2    Glucose 88 70 - 179 mg/dL    Calcium 8.8 8.7 - 04.5 mg/dL    Albumin 2.9 (L) 3.4 - 5.0 g/dL    Total Protein 8.2 5.7 - 8.2 g/dL    Total Bilirubin 40.9 (H) 0.3 - 1.2 mg/dL    AST 811 (H) <91 U/L    ALT 27 10 - 49 U/L    Alkaline Phosphatase 307 (H) 46 - 116 U/L         Jill Alexanders, MD    02/20/20 12:05 PM

## 2020-02-20 NOTE — Unmapped (Addendum)
It was a pleasure to see you today in clinic!    Plan from today's visit  ??? Follow up in early August with me by video  ??? We have ordered some blood tests today. Nothing has changed very much there.Marland Kitchen  ??? Please take 40 mgms of lasix a day, instead of 20 mgms, and we will ask that they get blood work when you come back to clinic  ??? I discussed your case with Dr. Craig Staggers and he thought it was safe for you to take 1 tegretol in the morning and 2 tegretol at night. Your dilantin will not change.  ??? Please continue to take your Folic acid, Voxeletor advance to 2 pills if you are able, and Epogen injections as prescribed. I am so impressed with your ability to manage these complicated medications.    How to get in touch with your Sickle Cell Team:     1. If you are sick, and need an ambulance, CALL 911.    ??? Otherwise, call [facility] at [phone], THEN OPTION 3, 8AM - 5PM, and ask for the NURSE TRIAGE LINE and leave a message   ??? On nights and weekends, for urgent problems you can call the [phone] and ask for the HEMATOLOGIST ON CALL.     2.  For an appointment, please call [facility] at [phone], 8AM - 5PM.    3.  For pain medication refills, leave a message on nurse triage line THREE days before refill needed. All other medication refills ask your pharmacy to fax request.       4.  For general questions, call the sickle cell office, [phone]. You can leave non-urgent messages here as well.    5.  For social work questions or needs; Please call Dickie La RN, MSW, MPH at [phone]

## 2020-02-21 DIAGNOSIS — G40909 Epilepsy, unspecified, not intractable, without status epilepticus: Principal | ICD-10-CM

## 2020-02-21 DIAGNOSIS — G40209 Localization-related (focal) (partial) symptomatic epilepsy and epileptic syndromes with complex partial seizures, not intractable, without status epilepticus: Principal | ICD-10-CM

## 2020-02-21 LAB — PHENYTOIN FREE: Phenytoin.free:MCnc:Pt:Ser/Plas:Qn:: 1.58

## 2020-02-21 LAB — CARBAMAZEPINE LEVEL: Carbamazepine:MCnc:Pt:Ser/Plas:Qn:: 5.1

## 2020-02-22 LAB — PRO-BNP: Serology studies:Cmplx:-:^Patient:Set:: 608 — ABNORMAL HIGH

## 2020-02-22 MED ORDER — CARBAMAZEPINE ER 400 MG TABLET,EXTENDED RELEASE,12 HR
ORAL_TABLET | 11 refills | 0 days | Status: CP
Start: 2020-02-22 — End: ?

## 2020-02-22 NOTE — Unmapped (Signed)
Tried to reach mr. Phagan. He was not available. He had said it was OK for me to call his mother, and when I called her he was in the background.  He knew that he takes dilantin 1 in the morning, 2 at night; Dr, Craig Staggers said that he could do the same thing with his Tegretol, 1 in the morning and 2 at night.    I will ask Dr. Imogene Burn to check in on him next week.

## 2020-02-28 MED FILL — PROCRIT 40,000 UNIT/ML INJECTION SOLUTION: SUBCUTANEOUS | 28 days supply | Qty: 8 | Fill #3

## 2020-02-28 MED FILL — PROCRIT 40,000 UNIT/ML INJECTION SOLUTION: 28 days supply | Qty: 8 | Fill #3 | Status: AC

## 2020-03-06 ENCOUNTER — Ambulatory Visit: Admit: 2020-03-06 | Discharge: 2020-03-07 | Payer: MEDICARE | Attending: Gastroenterology | Primary: Gastroenterology

## 2020-03-06 MED ORDER — SPIRONOLACTONE 25 MG TABLET
ORAL_TABLET | Freq: Every day | ORAL | 3 refills | 90.00000 days | Status: CP
Start: 2020-03-06 — End: 2021-03-06

## 2020-03-06 NOTE — Unmapped (Addendum)
[name] [place] Ph [phone]      Primary Care Provider:  Fredirick Maudlin, [name]    Other Specialist(s):   [facility]      PATIENT PROFILE:        Son [name] [name] is a 49 y.o. 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      HISTORY OF PRESENT ILLNESS: This is a 49 y.o. 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 I met him at that time on the Hepatology Consult service. He had an extensive inpatient work-up for abnormal liver enzymes and worsening hepatic function including MRCP, EUS to look at CBD, and TJ liver biopsy. He had a new cardiac Echo that showed moderate pulmonary HTN. He is scheduled to see pulmonary next week.    He presents today with his Mom for follow-up. Overall, he looks unwell. He is sitting in a wheelchair. His mom does most of the talking. Overall, not eating well. Taking 40mg  of lasix in AM. Not taking spironolactone. Denies worsening ascites or LE edema. Denies overt confusion but sleeping a lot. Patient denies worsening pruritis, SOB, CP, increased abdominal girth, LE edema, melena, BRBPR, confusion, depression, nausea, vomiting, or diarrhea.      REVIEW OF SYSTEMS:     The balance of 12 systems reviewed is negative except as noted in the HPI.     PAST MEDICAL HISTORY:    Past Medical History:   Diagnosis Date   ??? Anemia    ??? Foot pain    ??? 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 Hypertension    ??? Pulmonary hypertension (CMS-HCC)    ??? Seizures (CMS-HCC)    ??? Sickle cell anemia (CMS-HCC)    ??? Vitamin D deficiency        PAST SURGICAL HISTORY:    Past Surgical History:   Procedure Laterality Date   ??? PR ENDOSCOPIC US EXAM, ESOPH N/A 01/11/2020    Procedure: UGI ENDOSCOPY; WITH ENDOSCOPIC ULTRASOUND EXAMINATION LIMITED TO THE ESOPHAGUS;  Surgeon: Jules Husbands, [name];  [facility];  Service: Gastroenterology   ??? PR RIGHT HEART CATH O2 SATURATION & CARDIAC OUTPUT N/A 07/18/2017    Procedure: Right Heart Catheterization;  Surgeon: Zannie Cove, [name];  Location: [facility] CATH;  Service: Cardiology       MEDICATIONS:      Current Outpatient Medications:   ???  carBAMazepine (TEGRETOL  XR) 400 MG 12 hr tablet, Take 1 tablet in the morning and 2 tablets in the evening, Disp: 90 tablet, Rfl: 11  ???  cholecalciferol, vitamin D3, (VITAMIN D3) 10 mcg (400 unit) cap, Take 1 capsule (400 Units total) by mouth daily., Disp: 30 each, Rfl: 0  ???  diclofenac sodium (VOLTAREN) 1 % gel, Apply 2 g topically daily as needed for arthritis., Disp: 100 g, Rfl: prn  ???  empty container (SHARPS CONTAINER) Misc, use as directed, Disp: 1 each, Rfl: 2  ???  epoetin alfa (PROCRIT) 40,000 unit/mL injection, Inject 1 mL (40,000 Units total) under the skin Two (2) times a week., Disp: 8 mL, Rfl: 3  ???  folic acid (FOLVITE) 1 MG tablet, TAKE 1 TABLET BY MOUTH EVERY DAY, Disp: 30 tablet, Rfl: 11  ???  furosemide (  LASIX) 20 MG tablet, Take 2 tablets (40 mg total) by mouth daily., Disp: 60 tablet, Rfl: 0  ???  lidocaine (LIDODERM) 5 % patch, Place 1 patch on the skin., Disp: , Rfl:   ???  OXYGEN-AIR DELIVERY SYSTEMS MISC, 2 L/min by Each Nare route nightly. [employer], Disp: , Rfl:   ???  phenytoin (DILANTIN) 100 MG ER capsule, Take 1 capsule in the morning and 2 capsules in the evening (Patient taking differently: 100 mg Two (2) times a day. Take 1 capsule in the morning and 1 capsules in the evening), Disp: 90 capsule, Rfl: 11  ???  sodium polystyrene sulfonate (SODIUM POLYSTYRENE) 15 gram/60 mL Susp, Take 15 g by mouth daily., Disp: , Rfl:   ???  syringe with needle (BD TUBERCULIN SYRINGE) 1 mL 27 x 1/2 Syrg, Use as directed twice weekly, Disp: 24 each, Rfl: 3  ???  voxelotor (OXBRYTA) 500 mg tablet, Take 1 tablet (500 mg total) by mouth daily., Disp: 30 tablet, Rfl: 11  ???  lactulose (CONSTULOSE) 10 gram/15 mL solution, Take 45 mL (30 g total) by mouth Three (3) times a day. (Patient not taking: Reported on 02/20/2020), Disp: 4050 mL, Rfl: 3    ALLERGIES:    Patient has no known allergies.    SOCIAL HISTORY:    Social History     Socioeconomic History   ??? Marital status: Single     Spouse name: None   ??? Number of children: 0   ??? Years of education: None   ??? Highest education level: None   Occupational History   ??? Occupation: disability   Tobacco Use   ??? Smoking status: Former Smoker     Types: Cigars   ??? Smokeless tobacco: Never Used   ??? Tobacco comment: 1 cigar daily   Vaping Use   ??? Vaping Use: Never used   Substance and Sexual Activity   ??? Alcohol use: Not Currently     Alcohol/week: 0.0 standard drinks     Comment: occasional   ??? Drug use: No   ??? Sexual activity: None   Other Topics Concern   ??? None   Social History Narrative   ??? None     Social Determinants of Health     Financial Resource Strain: Low Risk    ??? Difficulty of Paying Living Expenses: Not hard at all   Food Insecurity: No Food Insecurity   ??? Worried About Programme researcher, broadcasting/film/video in the Last Year: Never true   ??? Ran Out of Food in the Last Year: Never true   Transportation Needs: No Transportation Needs   ??? Lack of Transportation (Medical): No   ??? Lack of Transportation (Non-Medical): No   Physical Activity:    ??? Days of Exercise per Week:    ??? Minutes of Exercise per Session:    Stress:    ??? Feeling of Stress :    Social Connections:    ??? Frequency of Communication with Friends and Family:    ??? Frequency of Social Gatherings with Friends and Family:    ??? Attends Religious Services:    ??? Database administrator or Organizations:    ??? Attends Engineer, structural:    ??? Marital Status:        FAMILY HISTORY:    family history includes Clotting disorder in his paternal grandmother; Hypertension in his father and mother; No Known Problems in his brother, brother, brother, brother, maternal aunt, maternal grandfather, maternal grandmother,  maternal uncle, paternal aunt, paternal grandfather, paternal uncle, sister, sister, and another family member; Sickle cell anemia in his sister.      VITAL SIGNS:    BP 117/61  - Pulse 56  - Temp 35.6 ??C (96.1 ??F)  - Resp 18  - Wt 68 kg (150 lb)  - SpO2 98%  - BMI 24.21 kg/m??   Body mass index is 24.21 kg/m??.    PHYSICAL EXAM:    Constitutional:   Alert, oriented x 3, sitting in wheelchair.   Mental Status:   Minimally interactive   HEENT:   PEERL, conjunctiva clear, anicteric, oropharynx clear, neck supple, no LAD.   Respiratory: Clear to auscultation, and percussion to the bases, unlabored breathing.     Cardiac: Regular rate and rhythm normal S1 and S2, no murmur.      Abdomen: Soft, mildly distended, palpable liver.     Perianal/Rectal Exam Not performed.     Extremities:   1+ edema, well perfused.   Musculoskeletal: No joint swelling or tenderness noted, no deformities.     Skin: No rashes, jaundice or skin lesions noted.     Neuro: No focal deficits. No asterixis.         DIAGNOSTIC STUDIES:  I have reviewed all pertinent diagnostic studies, including:    GI Procedures:  EUS: 01/11/20   - There was no sign of significant pathology in the  entire main bile duct.   - There was diffuse abnormal echotexture in the visualized portion of the liver. This was characterized by a hyperechoic appearance and a  lobulated appearance.   - There was no sign of significant pathology in the gallbladder body on today's examination- could be due to difference in modality given he has had sludge and stones on RUQ U/S and MRCP.   - There was no sign of significant pathology in the entire pancreas.   - Ascites.   - A few benign lymph nodes were visualized in the peripancreatic region and porta hepatis region.    TJ Liver Biopsy 01/14/20: Transhepatic gradient 13 mmHg  Liver, biopsy  - Prominent panlobular sinusoidal congestion with hepatic plate atrophy and [name]???moderate cholestasis with focal feathery degeneration of hepatocytes  - Portal tracts with reactive bile ductular proliferation and [name] lymphocytic inflammation  - Trichrome stain shows robust perisinusoidal fibrosis and increased portal fibrosis with periportal septae (stage 2)  - No significant steatosis identified  - See comment     Microscopic examination demonstrates 3 cores of hepatic parenchyma with greater tjam 10 portal tracts distributed within them.  While the overall hepatic architecture is intact, there is extensive panlobular expansion of the sinusoids secondary to congestion.  Red blood cells are frequently clumped, but sickle cell morphology appears [name].  There is accompanying [name]???moderate lobular cholestasis with patchy feathery degeneration of hepatocytes..  No significant steatosis, viral cytopathic, or granuloma are seen.  Portal tracts show expansion by reactive bile ductular proliferation with accompanying [name] lymphocytic infiltrate with scant interface activity.  No significant plasma cell population is seen.    ??  Stains are performed on block A1.  Trichrome stain shows extensive perisinusoidal fibrosis and portal expansion by fibrosis with periportal septae; there are focal suggestions of early bridging fibrosis.  Reticulin stain shows hepatic plates 1-2 cells thick with areas of condensation suggestive of hepatocyte atrophy.  A PASD stain demonstrates PAS positive material predominantly in Kupffer cells.  Focally moderately increased hepatocyte iron (grade 3) is seen on iron stain.  Immunohistochemical  stain for CK19 outlines the reactive bile ductular proliferation and shows bile ducts associated with the majority of the portal tracts.  A copper stain shows focal copper moderate deposition in the hepatocytes.     The overall appearance of the liver with congestion, sinusoidal fibrosis, and hepatocyte atrophy are suggestive of outflow obstruction and are likely related to/exacerbated by the patient's history of sickle cell anemia.    Radiographic studies:  Echocardiogram 01/10/20    1. The left ventricular systolic function is normal, LVEF is visually  estimated at 60-65%.    2. Mitral annular calcification is present ([name]).    3. There is moderate mitral valve regurgitation.    4. The left atrium is severely dilated in size.    5. The aortic valve is trileaflet with mildly thickened leaflets with normal  excursion.    6. The right ventricle is upper normal in size, with normal systolic  function.    7. There is moderate pulmonary hypertension, estimated pulmonary artery  systolic pressure is 46 mmHg.    8. The right atrium is moderately to severely dilated  in size.    9. IVC size and inspiratory change suggest mildly elevated right atrial  pressure. (5-10 mmHg).    MRI/MRCP w/o contrast 01/11/20: Limited study secondary to use of noncooperative protocol.  -- Cholelithiasis and gallbladder distention with [name] wall thickening, which is again non-specific given underlying chronic liver disease and small volume ascites.   -- [name] dilation of the common bile duct with distal narrowing, however no definite evidence of choledocholithiasis or obstructing lesion. No intrahepatic biliary ductal dilation.  -- Hepatomegaly with nodular liver contour, suggestive of chronic liver disease.    Laboratory results:    No visits with results within 1 Week(s) from this visit.   Latest known visit with results is:   Office Visit on 02/20/2020   Component Date Value Ref Range Status   ??? Phenytoin Lvl 02/20/2020 7.5* 10.0 - 20.0 ug/mL Final   ??? LDH 02/20/2020 >750* 120 - 246 U/L Final   ??? Bilirubin, Direct 02/20/2020 7.70* 0.00 - 0.30 mg/dL Final   ??? Sodium 16/05/9603 134* 135 - 145 mmol/L Final   ??? Potassium 02/20/2020    Final   ??? Chloride 02/20/2020 108* 98 - 107 mmol/L Final   ??? Anion Gap 02/20/2020 6  3 - 11 mmol/L Final   ??? CO2 02/20/2020 20.3  20.0 - 31.0 mmol/L Final   ??? BUN 02/20/2020 12  9 - 23 mg/dL Final   ??? Creatinine 02/20/2020 0.87  0.60 - 1.10 mg/dL Final   ??? BUN/Creatinine Ratio 02/20/2020 14   Final   ??? EGFR CKD-EPI Non-African American,* 02/20/2020 >90  mL/min/1.79m2 Final   ??? EGFR CKD-EPI African American, Male 02/20/2020 >90  mL/min/1.82m2 Final   ??? Glucose 02/20/2020 88  70 - 179 mg/dL Final   ??? Calcium 54/04/8118 8.8  8.7 - 10.4 mg/dL Final   ??? Albumin 14/78/2956 2.9* 3.4 - 5.0 g/dL Final   ??? Total Protein 02/20/2020 8.2  5.7 - 8.2 g/dL Final   ??? Total Bilirubin 02/20/2020 13.3* 0.3 - 1.2 mg/dL Final   ??? AST 21/30/8657 144* <34 U/L Final   ??? ALT 02/20/2020 27  10 - 49 U/L Final   ??? Alkaline Phosphatase 02/20/2020 307* 46 - 116 U/L Final   ??? Carbamazepine Level 02/20/2020 5.1  4.0 - 12.0 ug/mL Final   ??? Phenytoin, Free 02/20/2020 1.58  1 - 2 ug/mL Final   ???  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  Patient had longstanding hyperbilirubinemia that was mostly indirect (since 2014???2020) not atypical and most [name] patients. Since April 2020 patient has had a shift to mostly direct hyperbilirubinemia.  He was evaluated for biliary obstruction which was negative.  Liver biopsy was pursued due to nodular liver contour, progressive worsening of patient's synthetic function (platelet 136, INR: 1.57, albumin 3.4) with concern for cirrhosis. He has also had longstanding persistent alkaline phosphatase elevations but most recently has had elevations in GGT which suggest liver rather than bone derived.  He had a TJ liver biopsy which revealed a transhepatic gradient of 13 mmHg (clinically significant portal HTN).      The liver biopsy itself is probably most significant for the things that it did not show.  We were looking for anything other than evidence of sickle cell hepatopathy.  He had a history of Dilantin use but had no clear features of Dilantin toxicity.  He had a positive ANA at 1:160 but no evidence of autoimmune hepatitis.  He had grade 3 iron staining but his ferritin is only 173 so no iron overload in need of chelation.  He had a very congested appearing liver which is seen with sickle cell hepatopathy.  He had perhaps more congestion in zone 3.  He had a repeat echocardiogram that showed perhaps moderate pulmonary hypertension.  He is seeing pulmonary next week.  He has little to nothing reversible on this biopsy so we are hopeful that perhaps there is a component of congestion from right heart dysfunction.  ??  I can't tell how compliant he is with voxelotor and Epogen. Voxelotor is liver friendly. His Mom is convinced that things became worse with increasing Epogen doses. I suspect he was worsening and requiring higher Epogen doses.    PLAN:       -- labs and imaging and notes reviewed  -- take both pills of furosemide (40mg ) in the AM  -- add spironolactone 25mg  (new prescription)  -- 1 protein supplement drink daily  -- low salt diet discussed  -- vitamin D3 2000u daily  -- keep follow-up with pulmonary next week  -- start zinc 50mg  BID for possible HE (better in SS patients)  -- will discuss need for upper endoscopy in the future (needs varices screen with clinically significant portal HTN)  -- discuss issues with Procrit shot with hematology  -- follow-up in 6 months    Please call with questions.    Zaivion Kundrat M. Piedad Climes, [name]  [facility]

## 2020-03-06 NOTE — Unmapped (Signed)
--   labs and imaging and notes reviewed  -- take both pills of furosemide (40mg ) in the AM  -- add spironolactone 25mg  (new prescription)  -- 1 protein supplement drink daily  -- low salt diet  -- keep follow-up with pulmonary (Lung doctors) next week  -- will discuss need for upper endoscopy in the future  -- discuss issues with Procrit shot with hematology  -- follow-up in 6 months

## 2020-03-07 NOTE — Unmapped (Signed)
Paperwork orders/assessments from [facility] completed as requested and successfully faxed back to office.

## 2020-03-12 DIAGNOSIS — D571 Sickle-cell disease without crisis: Principal | ICD-10-CM

## 2020-03-13 MED ORDER — PROCRIT 40,000 UNIT/ML INJECTION SOLUTION
SUBCUTANEOUS | 3 refills | 28 days
Start: 2020-03-13 — End: 2021-03-13

## 2020-03-14 ENCOUNTER — Ambulatory Visit: Admit: 2020-03-14 | Discharge: 2020-03-14 | Payer: MEDICARE

## 2020-03-14 DIAGNOSIS — D571 Sickle-cell disease without crisis: Principal | ICD-10-CM

## 2020-03-14 DIAGNOSIS — M25519 Pain in unspecified shoulder: Principal | ICD-10-CM

## 2020-03-14 DIAGNOSIS — G40909 Epilepsy, unspecified, not intractable, without status epilepticus: Principal | ICD-10-CM

## 2020-03-14 DIAGNOSIS — I5083 High output heart failure: Principal | ICD-10-CM

## 2020-03-14 DIAGNOSIS — I272 Pulmonary hypertension, unspecified: Principal | ICD-10-CM

## 2020-03-14 LAB — MANUAL DIFFERENTIAL
EOSINOPHILS - ABS (DIFF): 0.1 10*9/L
EOSINOPHILS - REL (DIFF): 1 %
LYMPHOCYTES - ABS (DIFF): 2 10*9/L
MONOCYTES - ABS (DIFF): 1 10*9/L
MONOCYTES - REL (DIFF): 19 %
NEUTROPHILS - ABS (DIFF): 2.3 10*9/L
NEUTROPHILS - REL (DIFF): 43 %

## 2020-03-14 LAB — COMPREHENSIVE METABOLIC PANEL
ALKALINE PHOSPHATASE: 298 U/L — ABNORMAL HIGH (ref 46–116)
ALT (SGPT): 30 U/L (ref 10–49)
ANION GAP: 8 mmol/L (ref 5–14)
AST (SGOT): 130 U/L — ABNORMAL HIGH (ref <=34)
BILIRUBIN TOTAL: 14.7 mg/dL — ABNORMAL HIGH (ref 0.3–1.2)
BLOOD UREA NITROGEN: 22 mg/dL (ref 9–23)
BUN / CREAT RATIO: 23
CALCIUM: 9.8 mg/dL (ref 8.7–10.4)
CHLORIDE: 107 mmol/L (ref 98–107)
CO2: 21.8 mmol/L (ref 20.0–31.0)
CREATININE: 0.95 mg/dL
EGFR CKD-EPI AA MALE: >90 mL/min/{1.73_m2} (ref >=60)
EGFR CKD-EPI NON-AA MALE: >90 mL/min/{1.73_m2} (ref >=60)
GLUCOSE RANDOM: 120 mg/dL (ref 70–179)
POTASSIUM: 4.4 mmol/L (ref 3.4–4.5)
PROTEIN TOTAL: 9 g/dL — ABNORMAL HIGH (ref 5.7–8.2)
SODIUM: 137 mmol/L (ref 135–145)

## 2020-03-14 LAB — CBC W/ AUTO DIFF
HEMATOCRIT: 18.7 % — ABNORMAL LOW (ref 38.0–50.0)
HEMOGLOBIN: 6.6 g/dL — ABNORMAL LOW (ref 13.5–17.5)
MEAN CORPUSCULAR HEMOGLOBIN: 39.7 pg — ABNORMAL HIGH (ref 26.0–34.0)
MEAN CORPUSCULAR VOLUME: 113.1 fL — ABNORMAL HIGH (ref 81.0–95.0)
NUCLEATED RED BLOOD CELLS: 87 /100{WBCs}
RED BLOOD CELL COUNT: 1.65 10*12/L — ABNORMAL LOW (ref 4.32–5.72)
WBC ADJUSTED: 5.4 10*9/L (ref 3.5–10.5)

## 2020-03-14 LAB — WBC ADJUSTED: Leukocytes:NCnc:Pt:Bld:Qn:: 5.4

## 2020-03-14 LAB — RETICULOCYTES: RETICULOCYTE COUNT PCT: 19.8 % — ABNORMAL HIGH (ref 0.5–1.7)

## 2020-03-14 LAB — PRO-BNP: Serology studies:Cmplx:-:^Patient:Set:: 573 — ABNORMAL HIGH

## 2020-03-14 LAB — ANION GAP: Anion gap 3:SCnc:Pt:Ser/Plas:Qn:: 8

## 2020-03-14 LAB — LACTATE DEHYDROGENASE: Lactate dehydrogenase:CCnc:Pt:Ser/Plas:Qn:: 1082 — ABNORMAL HIGH

## 2020-03-14 LAB — RETICULOCYTE COUNT PCT: Lab: 19.8 — ABNORMAL HIGH

## 2020-03-14 LAB — LYMPHOCYTES - ABS (DIFF): Lymphocytes:NCnc:Pt:Bld:Qn:: 2

## 2020-03-14 MED ORDER — OXYCODONE 5 MG TABLET
ORAL_TABLET | Freq: Four times a day (QID) | ORAL | 0 refills | 5.00000 days | Status: CP | PRN
Start: 2020-03-14 — End: ?

## 2020-03-14 NOTE — Unmapped (Addendum)
Thank you for choosing [facility]!  We appreciate the opportunity to participate in your care.    You will likely be following up with one of the specialty teams if you were seen in [facility].  If any questions or concerns arise after your visit, please do not hesitant to contact me by [facility] or by calling:  Main Orthopaedic Appointment Center: [phone]     Patient Education        Broken Collarbone: Care Instructions  Your Care Instructions     You have broken or cracked your collarbone, or clavicle. The collarbone is the long, slightly curved bone that connects the shoulder to the chest. It supports the shoulder.  A broken collarbone may take 6 weeks or longer to heal. You will need to wear an arm sling to keep the broken bone from moving while it heals. At first, it may hurt to move your arm. This will get better with time.  You heal best when you take good care of yourself. Eat a variety of healthy foods, and don't smoke.  Follow-up care is a key part of your treatment and safety. Be sure to make and go to all appointments, and call your doctor if you are having problems. It's also a good idea to know your test results and keep a list of the medicines you take.  How can you care for yourself at home?  ?? Wear the sling day and night for as long as your doctor tells you to. You may take off the sling when you bathe. When the sling is off, avoid arm positions or motions that cause or increase pain.  ?? Put ice or a cold pack on your collarbone for 10 to 20 minutes at a time. Try to do this every 1 to 2 hours for the next 3 days (when you are awake) or until the swelling goes down. Put a thin cloth between the ice and your skin.  ?? Be safe with medicines. Take pain medicines exactly as directed.  ? If the doctor gave you a prescription medicine for pain, take it as prescribed.  ? If you are not taking a prescription pain medicine, ask your doctor if you can take an over-the-counter medicine.  ? Do not take two or more pain medicines at the same time unless the doctor told you to. Many pain medicines have acetaminophen, which is Tylenol. Too much acetaminophen (Tylenol) can be harmful.  ?? Try sleeping with pillows propped under your arm for comfort.  ?? After a few days, put your fingers, wrist, and elbow through their full range of motion several times a day. This will keep them from getting stiff. You may get instructions on rehabilitation exercises you can do when your shoulder starts to heal.  ?? You may use warm packs after the first 3 days for 15 to 20 minutes at a time to ease pain.  ?? You may notice a bump where the collarbone is broken. Over time, the bump will get smaller. A small bump may remain, but it should not affect your arm's strength or movement.  When should you call for help?   Call your doctor now or seek immediate medical care if:  ?? ?? Your fingers become numb, tingly, cool, or pale.   ?? ?? You cannot move your arm.   Watch closely for changes in your health, and be sure to contact your doctor if:  ?? ?? You have new or increased pain.   ?? ??  You have new or increased swelling.   ?? ?? You do not get better as expected.   Where can you learn more?  Go to [facility] at https://myuncchart.org  Select Patient Education under [employer]. Enter P186 in the search box to learn more about Broken Collarbone: Care Instructions.  Current as of: July 09, 2019??????????????????????????????Content Version: 12.9  ?? 2006-2021 [employer].   Care instructions adapted under license by [facility]. If you have questions about a medical condition or this instruction, always ask your healthcare professional. [employer], Inc[employer] disclaims any warranty or liability for your use of this information.

## 2020-03-14 NOTE — Unmapped (Signed)
Thank you for visiting the [facility]pert[facility].    If you have any questions or concerns, please call     Haze Justin, MD  Marva Panda, RN, Pulmonary Hypertension Nurse Coordinator  Claretha Cooper, RN, Pulmonary Hypertension Nurse Coordinator    [phone].          1. Labs today.    2. Shoulder x-ray in this building.    3. I will set up the catheterization procedure for either next week or the following.    4. I will message the sickle cell team to check in with you on shoulder pain management.

## 2020-03-14 NOTE — Unmapped (Signed)
[facility]  Pulmonary Hypertension Program                        Lattie Haw, MD???Director (Pulmonary)  Denice Bors. Tresa Res, MD--Associate Director (Pulmonary)  Freeman Caldron, MD (Cardiology)  Esmond Plants, DO (Pulmonary)    Marva Panda, RN Nurse Coordinator  Claretha Cooper, RN Nurse Coordinator      [phone] office  [phone] fax            Assessment:      Patient:[name] [name] (12/21/1970)    [name] is a 49 y.o. male who is seen for followup of hypoxemia and pulmonary hypertension. 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 The patient's current NYHA functional class is Class 3 - comfortable at rest but have symptoms with less than ordinary physical activity.      From a PH standpoint, [name] has had mild pulmonary hypertension by last RHC (2.5 years ago), with elevated wedge pressure and very normal pulmonary vascular resistance, no indication for PAH-specific therapy based on this. We have previously discussed hydroxyurea, ultimately thought to be too high risk. In the last few months he has experienced significant clinical decline, especially as it relates to liver disease and fluid retention. Based on my prior cath, and also based on free/wedged hepatic pressures with his liver biopsy, I tend to doubt ascites/liver disease is due to chronic RV failure/PH, but I cannot be certain of this without repeating his full hemodynamics, particularly with biopsy findings of congestion (can this be from SCD and outflow occlusions?).       Plan:       - Will set up for RHC to reevaluate PA pressures and RV failure picture, he is in agreement.  - Labs today, including CBC/retic/proBNP.  - Discussed shoulder pain and abnormal exam; I will send for shoulder X-rays today. I do not think this is representing pain crisis but also on the differential. As he is very uncomfortable and asking about narcotics, messaged SCD team to help with decision making re: opiates.  - Continue oxygen at night, and PRN during the day.  - As previously noted; PH high output and volume driven, no indication for PAH-specific therapy at this time.  - Agree with continued dosing of furosemide and spironolactone, still some edema on exam.    Followup next for RHC, then in 3 months, or sooner if needed.        Cecelia Byars, MD  03/14/20        Addendum: R distal clavicular fracture on X-ray. Able to reach mom by cell, will go over to [facility] for further care.    I personally spent 58 minutes face-to-face and non-face-to-face in the care of this patient, which includes all pre, intra, and post visit time on the date of service.      Subjective:          HPI: [name] is a 49 y.o. male who is seen for followup pulmonary hypertension and hypoxemia.    Relevant pulmonary issues including:  - Multifactorial pulmonary hypertension (mPAP 26, PCWP 16, CO/CI 10.3/5.7, PVR 1.2) by RHC 06/2017  - Sickle cell disease, denies history of acute chest syndrome  -  Hypoxemia, prescribed intermittent O2 since at least 2016  - RTA, with hyperkalemia  - Smoking history: current of small cigars, 2/day, reports only smoking since 2017    PAH treatment history:  None    Oxygen use: 2LPM at night and PRN exertion (not recently using)    Initial visit 11/2016: Presents for further evaluation of possible pulmonary hypertension based on prior echos. He has history of sickle cell disease, estimates having pain crises 2-3 years with involvement of legs/feet in particular. Denies prior history of acute chest syndrome. He received transfusion last month but denies otherwise needing transfusions recently (last here in 2016). He is not on hydroxyurea (patient declines this). He reports feeling generally well from a cardiopulmonary standpoint. He does not exercise and is not all that active with strenuous activity. Does not get out of breath with walking around at home or with self care, and thinks he would feel okay with one flight of stairs (only has a couple stairs at home). Denies chest pain or lightheadedness. No orthopnea. Notes edema only when having a pain crisis. He is currently on oxygen on PRN basis, with sleep and with exertion. He does not regularly wear it at night, only once in awhile and does not use it much during the day. He does not have a POC and is not interested in having one (does not think he would use this). DME company is [employer]. With initial visit I scheduled VQ scan, and we discussed increasing oxygen use. I also recommended diagnostic RHC and hydroxyurea, but he was very reluctant to pursue either.    Subsequent history: At followup 02/2017 he was feeling okay. Oxygenation on actually significantly improved. He had recently started on hydroxyurea after a renal visit, but also expressed reluctance to continue this. I again recommended RHC and suggested talking about this more during his hematology appointment over the summer. Also stressed increased use of oxygen particularly at night. At followup 06/2017 he was feeling about the same, did note some new dizziness. Had stopped hydroxyurea. I recommended RHC, scheduled later that month, showing very mild PH with elevated PCWP and high cardiac output. At followup 09/2017 he was feeling well, discussed hydroxyurea and he cited cost as the reason he did not want to take it, which we clarified. Discussed increase use of oxygen. At followup 03/2018 he was doing okay, HU ultimately decided to be too high risk due to renal dysfunction and neutropenia. Oxygen use still rare. At telehealth followup 01/2019 doing okay, reviewed interim hospitalization with hypoxemia and low Hgb, for which Epogen had been started, discussed importance of oxygen adherence.    Interval history 02/2020: Since last visit  Hospitalized in May and then June with worsening dyspnea, edema, liver function much worse.  Seeing Dr. Piedad Climes now for liver disease, biopsy also reviewed.  Feels leg edema is getting better, though still present.  Furosemide and now spironolactone just added.  Breathing feels okay, but very fatigued, doing little activity.  Using oxygen at night.  Taking Epogen again, feels very poorly after injections, hoping he does not need this.  Had a fall one week ago, very painful R shoulder.      Past medical, past surgical, family, and social histories reviewed and updated in Epic.    Review of systems is significant for:   A 12 point review of systems was negative except for pertinent items noted in the HPI.    No Known Allergies   Current Outpatient Medications  Medication Sig Dispense Refill   ??? carBAMazepine (TEGRETOL  XR) 400 MG 12 hr tablet Take 1 tablet in the morning and 2 tablets in the evening 90 tablet 11   ??? cholecalciferol, vitamin D3, (VITAMIN D3) 10 mcg (400 unit) cap Take 1 capsule (400 Units total) by mouth daily. 30 each 0   ??? diclofenac sodium (VOLTAREN) 1 % gel Apply 2 g topically daily as needed for arthritis. 100 g prn   ??? empty container (SHARPS CONTAINER) Misc use as directed 1 each 2   ??? epoetin alfa (PROCRIT) 40,000 unit/mL injection Inject 1 mL (40,000 Units total) under the skin Two (2) times a week. 8 mL 3   ??? folic acid (FOLVITE) 1 MG tablet TAKE 1 TABLET BY MOUTH EVERY DAY 30 tablet 11   ??? furosemide (LASIX) 20 MG tablet Take 2 tablets (40 mg total) by mouth daily. 60 tablet 0   ??? lactulose (CONSTULOSE) 10 gram/15 mL solution Take 45 mL (30 g total) by mouth Three (3) times a day. 4050 mL 3   ??? lidocaine (LIDODERM) 5 % patch Place 1 patch on the skin.     ??? OXYGEN-AIR DELIVERY SYSTEMS MISC 2 L/min by Each Nare route nightly. [employer]     ??? phenytoin (DILANTIN) 100 MG ER capsule Take 1 capsule in the morning and 2 capsules in the evening (Patient taking differently: 100 mg Two (2) times a day. Take 1 capsule in the morning and 1 capsules in the evening) 90 capsule 11   ??? sodium polystyrene sulfonate (SODIUM POLYSTYRENE) 15 gram/60 mL Susp Take 15 g by mouth daily.     ??? spironolactone (ALDACTONE) 25 MG tablet Take 1 tablet (25 mg total) by mouth daily. 90 tablet 3   ??? syringe with needle (BD TUBERCULIN SYRINGE) 1 mL 27 x 1/2 Syrg Use as directed twice weekly 24 each 3   ??? voxelotor (OXBRYTA) 500 mg tablet Take 1 tablet (500 mg total) by mouth daily. 30 tablet 11     No current facility-administered medications for this visit.           Objective:   Objective     Vitals:    03/14/20 1229   BP: 121/64   Pulse: 63   Resp: 16   Temp: 36.3 ??C (97.3 ??F)   SpO2: 95%       Wt Readings from Last 6 Encounters:   03/14/20 64.2 kg (141 lb 8 oz)   03/06/20 68 kg (150 lb)   02/20/20 70.5 kg (155 lb 6 oz)   01/26/20 68.6 kg (151 lb 3.8 oz)   01/12/20 68.8 kg (151 lb 10.8 oz)   10/10/19 65.1 kg (143 lb 8 oz)       General Appearance:    Alert, cooperative, no distress but very fatigued appearing    HEENT:    Normocephalic, without obvious abnormality, atraumatic. PERRL, conjunctiva clear. Sclera mildly incteric. Nares normal, no drainage, no sinus tenderness. Oropharynx with no lesions. No facial telangectasias.   Neck:    Supple, trachea midline, no adenopathy; JVD with venous pulsations 10 cm ASA.    Lungs:    Good breath sounds b/l, no crackles or wheezes, respirations unlabored.   Chest wall:   Expands symmetrically, no tenderness or deformity.   Heart::   Regular rate and rhythm, S1 and S2 normal, P2 mildly prominent, 2/6 SM best at LLSB and RUSB, no rubs or gallop.    Abdomen:   Soft, non-tender, normal bowel  sounds, no masses, no organomegaly   Extremities:     Right shoulder held down inferiorly, with pain with passive movement in all directions, and tenderness to palpation over distal clavicle, ?deformity. Extremities otherwise normal, no cyanosis, clubbing. 1-2+ edema bilaterally. No sclerodactyly.   Skin:   No rashes or lesions.   Neuro/psych:      No focal neurologic deficits; mood/affect normal..         Diagnostic Review:    Cardiac testing summary:  TTE 01/10/20 (reviewed): LA sev dil, EF 60-65%, mod MR       RV ULN size, nl fxn, mild TR, est PASP 46       No effusion. Est RAP by IVC 5-10  TTE 11/2018: EF >55%, LV mild dil,        RV mild dil, nl fxn, can't est PASP. IVC normal       Bubble study neg  TTE 09/2018: LA 39, EF 60-65%, mild-mod MR, nl septal       RV dil hyperdynamic fxn, TAPSE 30, tr TR, est PASP 38+CVP       No effusion. IVC dil/poor insp collapse  RHC 06/2017: RA 10, PA 39/18 (26), PCW 16       CO/CI 10.3/5.7 (td) and 8.3/4.6 (f), PA sat 50%, PVR 1.0 (td), PAC 8.8       Hgb 4.7 during procedure  TTE 09/2016: LA 56, EF >55%, mild-mod MR       RV dil, normal fxn, mild Tr, can't est PASP        Normal septal position       No effusion. IVC dil/poor insp collapse  TTE 10/2015: LA 56, EF 60-65%, mild MR       RV normal, can't est PASP       No effusion. IVC dil/poor insp collapse  TTE 01/2015: EF 60-65%       RV mild dil/HK, mild TR, TRV suboptimal but est 36+CVP       No effusion. IVC dil/poor insp collapse    CTA chest 01/2015: no PE       Mild emphysema esp RUL. Stable 5mm RML nodule       Soft tissue mass, extrapleural, 38x17, increased in size, likely EMH       Bibasilar atelectasis/reticulations ?atelectasis     V/Q scan 12/2016: normal ventilation       Perfusion within normal limits. No e/o CTEPH.    03/2019: 430 meters. HR 63->87, O2 sat 100->98% RA. Max Borg 2.  02/2017: 457 meters. HR 66->88, O2 sat 100%->97% RA. Max Borg 3  11/2016: 367 meters. HR 78->98, O2 sat 94% RA->87% RA; 95% on 2L    Pulmonary Function Testing:  Date FEV1 FVC TLC FRC RV DLCO             11/2016  3.19 (96%)  3.88 (95%)     14.8 (47%)                Overnight oximetry 06/2018: 611 minutes <89%. Sat avg 80%, nadir 63%.     Relevant Serologies: none  Labs 06/2017: ANA pos 1:160 speckled and cytoplasmic, ENA neg, HIV neg    Routine Labs:   Lab Results   Component Value Date    NA 134 (L) 02/20/2020    K  02/20/2020      Comment:      Hemolyzed specimen.      CL 108 (H) 02/20/2020    CO2  20.3 02/20/2020    BUN 12 02/20/2020    CREATININE 0.87 02/20/2020    GFR >= 60 11/21/2012    GLU 88 02/20/2020    CALCIUM 8.8 02/20/2020    ALBUMIN 2.9 (L) 02/20/2020    PHOS 4.3 06/28/2019     Lab Results   Component Value Date    WBC 6.1 01/28/2020    RBC 1.92 (L) 01/28/2020    HGB 6.8 (L) 01/28/2020    HCT 21.0 (L) 01/28/2020    MCV 109.6 (H) 01/28/2020    MCH 35.6 (H) 01/28/2020    MCHC 32.5 01/28/2020    RDW 20.4 (H) 01/28/2020    PLT 111 (L) 01/28/2020     Lab Results   Component Value Date    ALKPHOS 307 (H) 02/20/2020    BILITOT 13.3 (H) 02/20/2020    BILIDIR 7.70 (H) 02/20/2020    PROT 8.2 02/20/2020    ALBUMIN 2.9 (L) 02/20/2020    ALT 27 02/20/2020    AST 144 (H) 02/20/2020         Date NTproBNP        02/2020     01/2020  271->608   06/2019  762    11/2018  870    08/2018 1040   06/2017  639   03/2017  328   04/2016  423   01/2016  614     Labs 11/2018: ABG 7.40/39/78, sat 88%  Labs 01/2015: ABG 7.36/35/69 on RA, sat 87%

## 2020-03-14 NOTE — Unmapped (Signed)
Reached out to [name] [name] per pulmonology request. No answer on cell phone or mother's phone.    Today he was assessed in pulmonology.     7/23/21shoulder xray Inferiorly displaced and angulated extra-articular right distal clavicle fracture.Prominence of the left upper mediastinum compatible with the paravertebral soft tissue mass, better evaluated on prior chest CT      [name] [name] needs to be assess by ortho either in [place] or in emergency room pending [name] [name] pain needs. This was not communicated to patient or family.

## 2020-03-14 NOTE — Unmapped (Signed)
[name] [name] called in stating that he is no longer taking procrit and oxbryta.

## 2020-03-15 DIAGNOSIS — I5083 High output heart failure: Principal | ICD-10-CM

## 2020-03-15 MED ORDER — FUROSEMIDE 20 MG TABLET
ORAL_TABLET | 0 refills | 0.00000 days
Start: 2020-03-15 — End: ?

## 2020-03-15 NOTE — Unmapped (Signed)
[facility] Visit                                 Assessment:    Diagnosis ICD-10-CM Associated Orders   1. Closed displaced fracture of left clavicle, initial encounter  S42.002A      Plan: Discussed conservative treatment options (RICE, Tylenol, NSAIDs, heat as appropriate). NWB  RUE.  Arm sling given.  May take off to work on gentle range of motion, advised that this should be discontinued within a week.  Prescriptions electronically for oxycodone 5 mg 1 tab p.o. every 6 hours as needed pain #20 refill 0.  Since he is already a week out from injury, advised him that I will not refill this, and he should transition to Tylenol after this prescription had run out.  Follow up: Return for F/U 7-10 days, R clav fx, XR R clavicle 2 views.  Will discontinue arm sling if he has not already done so    DME for Upper Extremity:  He was prescribed a arm sling or Closed displaced fracture of left clavicle, initial encounter [S42.002A]. He  has weakness and/or instability of his right upper extremity which requires stabilization from the semi-rigid/rigid orthosis to potentially improve their function.      History of present illness:  49 y.o. RHD male with sickle cell, complaining of right clavicle pain.  DOI: 03/07/2020, fell on it.  Elicits pain with range of motion.  Evaluated earlier today at benign hematology, shoulder x-rays obtained, and showed distal clavicle fracture.  Referred to Orthopaedics.  Localizes pain to the distal clavicle, radiates to the right cervical paraspinous muscles, and to the deltoid.  Has tried OTC pain meds for pain relief which have not been helping his symptoms.  No paresthesias.    Work/school:       Physical examination: Dorsal prominence distal clavicle.  No scars, rashes or lesions.  Point TTP right distal clavicle.  Shoulder ROM: Forward elevation 80 degrees and painful, ER with arm at side 30 degrees, IR to L2. pain reproduced with shoulder shrug sensation intact to light touch right upper extremity.  Strength 4/5 due to pain.  Mild localized edema distal right clavicle.    Imaging: Reviewed 3 views of the right shoulder from earlier today, mild to moderately displaced extra-articular fracture of the distal clavicle.  Radiology studies were ordered and personally reviewed and interpreted by myself today

## 2020-03-17 MED ORDER — FUROSEMIDE 20 MG TABLET
ORAL_TABLET | ORAL | 0 refills | 0.00000 days | Status: CP
Start: 2020-03-17 — End: ?

## 2020-03-25 NOTE — Unmapped (Signed)
Attempted to reach patient for scheduled appointment. Patient unreachable.

## 2020-03-27 ENCOUNTER — Ambulatory Visit: Admit: 2020-03-27 | Payer: MEDICARE | Attending: Hematology | Primary: Hematology

## 2020-03-27 DIAGNOSIS — I272 Pulmonary hypertension, unspecified: Principal | ICD-10-CM

## 2020-04-07 ENCOUNTER — Ambulatory Visit: Admit: 2020-04-07 | Discharge: 2020-04-07 | Payer: MEDICARE

## 2020-04-07 LAB — CBC
HEMATOCRIT: 21.4 % — ABNORMAL LOW (ref 41.0–53.0)
HEMOGLOBIN: 7.2 g/dL — ABNORMAL LOW (ref 13.5–17.5)
MEAN CORPUSCULAR HEMOGLOBIN CONC: 33.8 g/dL (ref 31.0–37.0)
MEAN CORPUSCULAR HEMOGLOBIN: 40.2 pg — ABNORMAL HIGH (ref 26.0–34.0)
MEAN CORPUSCULAR VOLUME: 118.8 fL — ABNORMAL HIGH (ref 80.0–100.0)
MEAN PLATELET VOLUME: 10.2 fL — ABNORMAL HIGH (ref 7.0–10.0)
PLATELET COUNT: 177 10*9/L (ref 150–440)
RED BLOOD CELL COUNT: 1.8 10*12/L — ABNORMAL LOW (ref 4.50–5.90)
RED CELL DISTRIBUTION WIDTH: 23 % — ABNORMAL HIGH (ref 12.0–15.0)
WBC ADJUSTED: 5.6 10*9/L (ref 4.5–11.0)

## 2020-04-07 LAB — BASIC METABOLIC PANEL
ANION GAP: 7 mmol/L (ref 5–14)
BLOOD UREA NITROGEN: 28 mg/dL — ABNORMAL HIGH (ref 9–23)
BUN / CREAT RATIO: 27
CALCIUM: 9.3 mg/dL (ref 8.7–10.4)
CHLORIDE: 104 mmol/L (ref 98–107)
CO2: 24 mmol/L (ref 20.0–31.0)
CREATININE: 1.03 mg/dL
EGFR CKD-EPI AA MALE: >90 mL/min/{1.73_m2} (ref >=60)
EGFR CKD-EPI NON-AA MALE: 86 mL/min/{1.73_m2} (ref >=60)
GLUCOSE RANDOM: 84 mg/dL (ref 70–179)

## 2020-04-07 LAB — BLOOD UREA NITROGEN: Urea nitrogen:MCnc:Pt:Ser/Plas:Qn:: 28 — ABNORMAL HIGH

## 2020-04-07 LAB — WBC ADJUSTED: Leukocytes:NCnc:Pt:Bld:Qn:: 5.6

## 2020-04-07 MED ADMIN — lidocaine (PF) (XYLOCAINE-MPF) 20 mg/mL (2 %) injection: @ 17:00:00 | Stop: 2020-04-07

## 2020-04-07 MED ADMIN — heparin (porcine) in NS 10,000 unit/1,000 mL Manifold Flush: @ 18:00:00 | Stop: 2020-04-07

## 2020-04-07 NOTE — Unmapped (Signed)
Brief Operative Note  (CSN: [id number])      Date of Surgery: 04/07/2020    Pre-op Diagnosis: Sickle Cell Anemia, liver dysfunction    Post-op Diagnosis: Pulmonary HTN (CMS-HCC) [I27.20]    Procedure(s):  Right Heart Catheterization: 93451 (CPT??)  Note: Revisions to procedures should be made in chart - see Procedures activity.    Performing Service: Cardiology  Surgeon(s) and Role:     * Zannie Cove, MD - Primary    Assistant: None    Findings: mild pulmonary hypertension     Anesthesia: Conscious Sedation (Nurse Admin)    Estimated Blood Loss: <10cc    Complications: None    Specimens: None collected    Implants: * No implants in log *    Surgeon Notes: I was present and scrubbed for the entire procedure    Cecelia Byars   Date: 04/07/2020  Time: 1:33 PM

## 2020-04-07 NOTE — Unmapped (Signed)
Patient returned from cath lab procedure A/O x 4. Report received from [facility]. Right jugular vein intact with no bleeding or hematoma noted. Vs stable.

## 2020-04-07 NOTE — Unmapped (Signed)
Post Right Heart Catheterization with an IJ approach    * Leave neck band-aid on for 6-12 hours.  * Limit vigorously moving neck from left to right and from front to back for 12 hours.  * Do not bend over at the hip for 12 hours. Squat to pick up items from the floor.  * If bleeding occurs, apply light pressure on top of neck dressing with your hand for about 15 minutes.  * Apply light pressure to neck dressing if your cough or sneeze.  * Use Tylenol, also called Acetaminophen, for minor pain at the neck dressing site.  * Take a tub bath or sponge bath only. Do not shower for 24 hours.    Call the above numbers if you any of the following signs:  - Swelling, redness, drainage or bleeding that will not stop at the neck dressing site.  - A fever greater than 100.5 F.  - Shortness of breath, dizziness, difficulty swallowing or talking.    If you have any questions or concerns call the Cardiac Catheterization Lab @ [phone] form Monday - Friday, 7 am- 5:30pm. You may also contact [phone] and ask for the Pulmonary Fellow on call.

## 2020-04-07 NOTE — Unmapped (Addendum)
RIGHT HEART CARDIAC CATHETERIZATION REPORT      Conclusions:  ?? Mild pulmonary hypertension, secondary to high output state. PVR is within normal limits (1.7 WU).  ?? Mildly elevated RA pressure (8 mmHg, no respiratory variation), not high enough to be responsible for ascites/portal hypertension.  ?? Elevated cardiac output/index (CI 5.3 L/min/m2), in keeping with known anemia from SCD.  ?? Severe anemia (Hgb 4.9, repeated) noted on blood gas for Fick calculations; peripherally drawn Hgb 7.2 prior to procedure, unclear etiology of this discrepancy.  ?? In comparison to prior catheterization from 06/2017, there is no significant change in hemodynamics.    Plan:  ?? No indication for [facility] specific therapy. Continued care for liver disease, which at this point appears to be SCD related and not secondary to right heart failure. Okay for home today.          Access Site:  Right internal jugular vein  Attending:  Tresa Res    Fellow: Lupita Leash    Referring: Zeeshan Korte  Procedures Performed:     Venous access   Right heart catheterization    Indication: pulmonary hypertension    Hemodynamics:  Heart rate: 53  Systemic BP: 112/72   Mean arterial pressure: 85    RA a wave:  12  RA v wave:  12  RA mean:  8    RV (s/d/ed): 48/2/11    PA: 48/18   PA mean:  29    PCW a wave:  15  PCW v wave:  19  PCW mean:  13    PA sat: 53%    Arterial sat: 92%    Hemoglobin: 4.9 g/dL (note peripheral draw CBC/Hgb 7.2, unclear etiology of this difference)  Body surface area: 1.73 m2    Cardiac Output 8.64 L/min (Fick)  Cardiac Index 4.99 L/min/m2 (Fick)    Cardiac Output 9.18 L/min (Thermodilution)  Cardiac Index 5.30 L/min/m2 (Thermodilution)  Stroke volume index: 100.1 mL/beat/m2    PVR: 1.85 Wood Units/ 148 dyn-sec/cm5 (Fick)  SVR: 710 dyn-sec/cm5 (Fick)     PVR: 1.74 Wood Units/ 139 dyn-sec/cm5 (Thermodilution)  SVR: 670 dyn-sec/cm5 (Thermodilution)  PAC: 5.8 mL/mmHg    Technique:   Local anesthesia was administered using 2% lidocaine without epinephrine. Venous access was obtained by percutaneous entry of the right internal jugular vein using ultrasound imaging guidance and a MicroPuncture needle and sheath, and subsequently using a 7 French 10 cm introducing sheath. Right heart catheterization was performed using a 7 [place] pulmonary artery catheter advanced to the pulmonary capillary wedge position under fluoroscopic guidance. Cardiac output was measured using Thermodilution and the Fick principle. At the conclusion of the case, the right heart catheter and introducing sheath were removed with immediate application of manual compression with good hemostasis. Estimated blood loss was <20 mL.    Radiation summary:  Fluoro time (min): 1.1  Effective equivalent dose index (mGy): 8.4    Medications:  Lidocaine 2%: 5 mL    I have reviewed the recent history physical and documentation.  I Cecelia Byars, MD) was present for the entire procedure.        RA (0-25):      RV (0-50):      PA (0-50):      PCW (0-25):

## 2020-04-17 ENCOUNTER — Telehealth: Admit: 2020-04-17 | Discharge: 2020-04-18 | Payer: MEDICARE | Attending: Hematology | Primary: Hematology

## 2020-04-17 NOTE — Unmapped (Signed)
[facility] Follow-up Video Visit      Primary Care Physician:   Fredirick Maudlin, MD    Reason for Visit:  Follow up sickle cell anemia, Hgb SS    Assessment/Recommendations:   [name] [name] is a 49 y.o. year old African-American male who returns for follow-up of sickle cell anemia.      1.  Sickle cell anemia, Hgb SS: Severe anemia with macrocytosis. Hgb nadir of 4.5 12/08/18 with typical baseline range of 5.5-6.5 g/dL. Has been compliant with Folic acid, EPO 40k units twice weekly (missed a few doses during hospitalizations). Voxeletor discontinued earlier this summer given lack of improvement in his symptoms. He does seem to have better functional status since his hemoglobin is improved and diuretics have been initiated.  Pain is a challenge right now, but our video call was disconnected prior to determining a final pain regimen for him.   - Continue with folic acid daily  - Continue epo 40K units twice weekly- has missed a few doses ([facility])  - Continue to hold hydroxyurea, Voxeletor for now  - consider endari for pain  - close follow up in clinic on 9/9 for repeat labs including BMP, CBC    2. Pulmonary HTN / Hypoxia: Stable on RHC. Likely not contributing to worsening liver dysfunction.  - Continue oxygen as prescribed. Currently prescribed 2-4L at night; adherence improving.   - Can use vaseline around the nares for dryness while on oxygen    3. Congestive hepatopathy: Pt with chronic hyperbilirubinemia with recent exacerbation, appearance c/w congestion on biopsy, evidence of portal hypertension on imaging and new onset ascites. He has been taking lasix 20 mg daily with minimal change in urine output. Aldactone not initiated, presumably 2/2 history of hyperkalemia for which he takes Kayexalate EOD. Hopeful that this improves with improved hemoglobin level.   -Continue 40mg  lasix + 25mg  spironolactone, but needs BMP checked as above  - F/u with hepatology as scheduled    4. Proteinuria with intermittent hyperkalemia and low blood pressure in the past: Patient not a candidate for ACE inhibitor or ARB at present.   Follows outpatient with Nephrology (last seen 09/04/19) with no additional interventions recommended at this time. He is using his Kayexelate EOD and will need to monitor his potassium levels closely given issues in the past (notes that he is using prn when he has foods higher in potassium).   - f/u K levels on pending labs     5. Health maintenance:   Immunization History   Administered Date(s) Administered   ??? COVID-19 VACC,MRNA,(PFIZER)(PF)(IM) 11/17/2019, 12/14/2019   ??? Hepatitis B, Adult 03/25/2009, 04/24/2009   ??? INFLUENZA TIV (TRI) PF (IM) 07/21/2009, 09/12/2012   ??? Influenza Vaccine Quad (IIV4 PF) 23mo+ injectable 07/04/2015, 05/07/2016, 04/20/2018, 06/27/2019   ??? Influenza Virus Vaccine, unspecified formulation 10/11/2018   ??? Meningococcal C Conjugate 03/27/2013   ??? PNEUMOCOCCAL POLYSACCHARIDE 23 03/25/2009, 11/11/2017   ??? Pneumococcal Conjugate 13-Valent 07/04/2015   Hep B sAb neg in 2017 (although previously immunized?). Untransfusable, so may not be a priority at present.  Will also need to think about starting Twinrix series in near future.     6. Seizures, last time 3-4 years ago, still on Tegretol and Dilantin. Last levels therapeutic though phenytoin at the [facility]. Discussed as above. Pts requires f/u with neurology to discuss.    7. Erectile dysfunction: Previously endorsed issues with erectile dysfunction, but no issues currently (or with priapism).  8. Right sided hearing loss: Re-evaluate during in person visit. If unable to do so will plan on referral to ENT.     FOLLOW UP: 2 weeks, in-person    Patient discussed with Dr. Clarene Duke.     Wendee Beavers. Cristopher Peru, MD [facility]  Internal Medicine & Pediatrics, PGY5  [facility]  Pager [phone]    History of Present Illness:   [name] [name] is a 49 y.o. year old African-American male with Hgb SS who is here for follow up. His disease is characterized by severe anemia and difficult to transfuse due to alloantibodies. Has not tolerated disease modifying therapy in the past, though re-started volexotor since his last visit (08/15/19) which patient has tolerated thus far without issue.     Interim Hx: He generally feels better and is better able to ambulate around his house and yard since starting on his diuretics, lasix . He states he takes three diuretic pills per day and manages his own medications. He has been having pain in bilateral lower legs and right shoulder. Has been going on for 3-4 months. Reports pain is only at night, 3x/week, and not with ambulation. Also notes edema in bilateral legs. Pain will awaken from sleep. 5/10 pain. Will have to use a cane sometimes. Has been applying rubbing alcohol and taking oxycodone. Saw [facility] after fracturing his collarbone in mid July.  He does not otherwise have OTC pain medication that he can take with his liver dysfunction and kidneys.  He is no longer taking the volexotor and remains off hydrea. Unfortunately also has significant antibodies for blood products. Sleep has been ok, although his bubbler continues to disturb his sleep.    Prior HPI:  Since or last encounter pt has been admitted to [facility]  From 5/19 to 01/18/2020 with abdominal pain, nausea and vomiting, and hyperbilirubinemia. On admission, TBili 10.4, Dbili 6.6, AST 103, ALT 24, AlkPhos 285, GGT 357, mixed cholestatic/hepatic pattern. ProBNP 795, carbamazepine level WNL, phenytoin level low 6.6. Abd U/S +gallstones, but indeterminate for cholecystitis and with echogenic, nodular pattern suggestive of chronic liver disease. Echo with severely dilated LA, mod-severely dilated RA, mod pulm HTN, PASP 46. MRCP with cholelithiasis and GB wall thickening, mild dilation of CBD with distal narrowing but no definite e/o obstruction, no intrahepatic biliary duct dilation, hepatomegaly with nodule liver contour. Biliary was consulted and recommended EUS +/- ERCP which did not reveal any obstructive process.  ??  Hepatology consulted and recommended hepatitis serologies, auto-immune workup and transjugular biopsy, performed by [facility] 01/14/20. Received 2 units PRBC's on 01/13/2020 prior to procedure. Viral panel (HIV, HAV, HBV ab and quant negative, HCV negative). Autoimmune work-up unremarkable (ANA, anti-SM, liver/kidney microsome negative.)  Results from biopsy showed prominent congestion with mild-moderate cholestasis as well as robust perisinusoidal fibrosis without evidence of steatosis. Overall read was that these findings are consistent with exacerbation from his sickle cell disease. During the TJ biopsy, simultaneous measurement of hepatic wedge pressure demonstrated elevation consistent with portal hypertension.    Hospitalized again from 6/4/-01/28/2020 for ACS. No ischemic ECG changes to suggest acute coronary syndrome.??Empirically covered with ceftriaxone & azithromycin for acute chest coverage with??CT??findings??& sickle cell history??and patient improved dramatically within 48 hours. Discharged on cefdinir and azithro to complete 5 day course.    Sickle Cell Disease History:  Access: peripheral IV  1.  Genotype: SS (hemolysis: hemoglobin 6 , ARC 100-200 , LDH 3000-5000 , Bilirubin 3-5 , urobilinogen 0.2-2 )  Concomitant alpha thal: ?  Other genetic studies: no  BMT?:  no  Folic acid: yes  Hydroxyurea: was on 500mg /day but with mild neutropenia (1200) and minimal F elevation; patient with h/o telling different stories to different providers - in setting of neutropenia and renal dysfunction and minimal improvement in %F, we have not resumed Hydrea.  Started on Voxelator on 7/8 for hyperhemolysis, but discontinued per pt request secondary to nausea/vomiting. Had re-started Hydrea (500mg /day) 08/15/19 but discontinued 09/11/19 upon start of Volexotor (500mg  daily) which patient stopped earlier summer 2021. Studies/protocols: no   2. Pain (typical location): rare - legs, feet  Hospitalization frequency:  rare  Outpatient regimen: none  24h Morphine Equivalent Dose (Goal <100): 0  Bowel regimen: none  Inpatient regimen: ?   2. Transfusions: yes per previous notes but on 11/11/17 patient said 'never'  Life time blood transfusions: 6+  Last transfusion: 02/2018   History of DHTR?: unlikely  Alloantibodies: Yes  Iron status: ferritin 173 01/10/20  Medications: EPO 40,000 units twice weekly   3.  CV/Pulm: chronic hypoxia ( improved on HU per previous notes), supposed to be on o2 at night (2LPM); on 11/11/17 states he can 'walk 1/2 mile without getting short of breath'. Unable to complete PFTs on 03/21/19 due to in office fall. More consistent use of nightly oxygen of late.  Acute chest syndrome: Yes, June 2021  Need for mechanical ventilation?: no  Pulmonary hypertension: yes, h/o hypoxia - NYHA 2 and ? WHO group 5; RHC 06/2017 - mild PxHTN with PAmean = 26, PCW mean of 16 and increased CO, normal PVR 1.2 wood units; repeat RHC 8/16 with again mild pHTN w PA mean=8, PCW mean =13, normal PVR 1.74      4. Neuro: Hx of seizure D/O  Stroke/TIA: no  Imaging:     Other: epilepsy diagnosed at 49yo, on carbamazepine and phenytoin; follows regularly with neurology   5. Psych: cognitive delay  Concerns for depression/anxiety?: no  Concerns for substance dependence?: no  Medications: n/a   6. ID/Immune:  Splenectomy?: no  Antibiotics: no  Immunization history:   Immunization History   Administered Date(s) Administered   ??? COVID-19 VACC,MRNA,(PFIZER)(PF)(IM) 11/17/2019, 12/14/2019   ??? Hepatitis B, Adult 03/25/2009, 04/24/2009   ??? INFLUENZA TIV (TRI) PF (IM) 07/21/2009, 09/12/2012   ??? Influenza Vaccine Quad (IIV4 PF) 83mo+ injectable 07/04/2015, 05/07/2016, 04/20/2018, 06/27/2019   ??? Influenza Virus Vaccine, unspecified formulation 10/11/2018   ??? Meningococcal C Conjugate 03/27/2013   ??? PNEUMOCOCCAL POLYSACCHARIDE 23 03/25/2009, 11/11/2017   ??? Pneumococcal Conjugate 13-Valent 07/04/2015     ANA: positive 1:160 on 06/2017  HIV neg 06/2017  Hep BsAb neg 04/2016   7. GI/Hep:  Cholecystectomy?: no  Hepatopathy?: Congestive hepatopathy with portal HTN, ascites   Other: no   8. 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 GU:  Priapism?: yes, remote - last at 49yo per notes   10. M/sk:  AVN?: no  Ulcers?: no  Other: left iliac crest fracture (? 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 Endocrine:  Bone/vitamin D: on Vitamin D supplementation   Other:    13. Screening:   Ophtho/Retinopathy?: neg 09/2017  Audiology?: yes  Dental?: yes   14. Social: watches TV all day,  lives alone, drives  Education: high school   Work/Disability:   Smoking: cigars 2x/day  Alcohol:   Other:      Surgical History:  Past Surgical History:   Procedure Laterality Date   ??? PR ENDOSCOPIC US EXAM, ESOPH N/A 01/11/2020    Procedure: UGI ENDOSCOPY; WITH ENDOSCOPIC ULTRASOUND EXAMINATION LIMITED TO THE ESOPHAGUS;  Surgeon: Jules Husbands, MD;  [facility];  Service: Gastroenterology   ??? PR RIGHT HEART CATH O2 SATURATION & CARDIAC OUTPUT N/A 07/18/2017    Procedure: Right Heart Catheterization;  Surgeon: Zannie Cove, MD;  Location: [facility] CATH;  Service: Cardiology   ??? PR RIGHT HEART CATH O2 SATURATION & CARDIAC OUTPUT N/A 04/07/2020    Procedure: Right Heart Catheterization;  Surgeon: Zannie Cove, MD;  Location: [facility] CATH;  Service: Cardiology     Medications:   Current Outpatient Medications   Medication Sig Dispense Refill   ??? carBAMazepine (TEGRETOL  XR) 400 MG 12 hr tablet Take 1 tablet in the morning and 2 tablets in the evening 90 tablet 11   ??? cholecalciferol, vitamin D3, (VITAMIN D3) 10 mcg (400 unit) cap Take 1 capsule (400 Units total) by mouth daily. 30 each 0   ??? diclofenac sodium (VOLTAREN) 1 % gel Apply 2 g topically daily as needed for arthritis. 100 g prn   ??? epoetin alfa (PROCRIT) 40,000 unit/mL injection Inject 1 mL (40,000 Units total) under the skin Two (2) times a week. 8 mL 3   ??? folic acid (FOLVITE) 1 MG tablet TAKE 1 TABLET BY MOUTH EVERY DAY 30 tablet 11   ??? furosemide (LASIX) 20 MG tablet TAKE 2 TABLETS BY MOUTH EVERY DAY 60 tablet 0   ??? lactulose (CONSTULOSE) 10 gram/15 mL solution Take 45 mL (30 g total) by mouth Three (3) times a day. 4050 mL 3   ??? oxyCODONE (ROXICODONE) 5 MG immediate release tablet Take 1 tablet (5 mg total) by mouth every six (6) hours as needed for pain. 20 tablet 0   ??? phenytoin (DILANTIN) 100 MG ER capsule Take 1 capsule in the morning and 2 capsules in the evening (Patient taking differently: 100 mg Two (2) times a day. Take 1 capsule in the morning and 1 capsules in the evening) 90 capsule 11   ??? empty container (SHARPS CONTAINER) Misc use as directed 1 each 2   ??? lidocaine (LIDODERM) 5 % patch Place 1 patch on the skin. (Patient not taking: Reported on 04/07/2020)     ??? OXYGEN-AIR DELIVERY SYSTEMS MISC 2 L/min by Each Nare route nightly. [employer]     ??? sodium polystyrene sulfonate (SODIUM POLYSTYRENE) 15 gram/60 mL Susp Take 15 g by mouth daily.     ??? spironolactone (ALDACTONE) 25 MG tablet Take 1 tablet (25 mg total) by mouth daily. (Patient not taking: Reported on 04/17/2020) 90 tablet 3   ??? syringe with needle (BD TUBERCULIN SYRINGE) 1 mL 27 x 1/2 Syrg Use as directed twice weekly 24 each 3   ??? voxelotor (OXBRYTA) 500 mg tablet Take 1 tablet (500 mg total) by mouth daily. (Patient not taking: Reported on 04/17/2020) 30 tablet 11     No current facility-administered medications for this visit.     Allergies:  Patient has no known allergies.    Social History:  Social History     Socioeconomic History   ??? Marital status: Single     Spouse name: None   ??? Number of children: 0   ???  Years of education: None   ??? Highest education level: None   Occupational History   ??? Occupation: disability   Tobacco Use   ??? Smoking status: Current Every Day Smoker     Types: Cigars   ??? Smokeless tobacco: Never Used   ??? Tobacco comment: 1 cigar daily   Vaping Use   ??? Vaping Use: Never used   Substance and Sexual Activity   ??? Alcohol use: Not Currently     Alcohol/week: 0.0 standard drinks     Comment: occasional   ??? Drug use: No   ??? Sexual activity: None   Other Topics Concern   ??? None   Social History Narrative   ??? None     Social Determinants of Health     Financial Resource Strain: Low Risk    ??? Difficulty of Paying Living Expenses: Not hard at all   Food Insecurity: No Food Insecurity   ??? Worried About Programme researcher, broadcasting/film/video in the Last Year: Never true   ??? Ran Out of Food in the Last Year: Never true   Transportation Needs: No Transportation Needs   ??? Lack of Transportation (Medical): No   ??? Lack of Transportation (Non-Medical): No   Physical Activity:    ??? Days of Exercise per Week:    ??? Minutes of Exercise per Session:    Stress:    ??? Feeling of Stress :    Social Connections:    ??? Frequency of Communication with Friends and Family:    ??? Frequency of Social Gatherings with Friends and Family:    ??? Attends Religious Services:    ??? Database administrator or Organizations:    ??? Attends Engineer, structural:    ??? Marital Status:      Family History:  family history includes Clotting disorder in his paternal grandmother; Hypertension in his father and mother; No Known Problems in his brother, brother, brother, brother, maternal aunt, maternal grandfather, maternal grandmother, maternal uncle, paternal aunt, paternal grandfather, paternal uncle, sister, sister, and another family member; Sickle cell anemia in his sister.   He indicated that his mother is alive. He indicated that his father is alive. He indicated that two of his three sisters are alive. He indicated that all of his four brothers are alive. He indicated that the status of his maternal grandmother is unknown. He indicated that the status of his maternal grandfather is unknown. He indicated that the status of his paternal grandmother is unknown. He indicated that the status of his paternal grandfather is unknown. He indicated that the status of his maternal aunt is unknown. He indicated that the status of his maternal uncle is unknown. He indicated that the status of his paternal aunt is unknown. He indicated that the status of his paternal uncle is unknown. He indicated that the status of his neg hx is unknown. He indicated that the status of his other is unknown.    Review of Systems:  As per HPI, otherwise negative x 10 systems.    Objective :   There were no vitals filed for this visit.  Physical Exam via video:  Gen: A/o, slow speech, but good attention and speech content  HEENT: Scleral and sublingual icterus  Pulm: normal WOB  GI - protuberant abdomen, did not appear tense, no asterixis  Skin - xerosis on face  Neuro - Alert, oriented. CN grossly intact  Psych - responds appropriately to questions, makes good eye contact, linear  thought.    Test Results:  Results for orders placed or performed during the hospital encounter of 04/07/20   Basic Metabolic Panel   Result Value Ref Range    Sodium 135 135 - 145 mmol/L    Potassium 5.2 (H) 3.4 - 4.5 mmol/L    Chloride 104 98 - 107 mmol/L    CO2 24.0 20.0 - 31.0 mmol/L    Anion Gap 7 5 - 14 mmol/L    BUN 28 (H) 9 - 23 mg/dL    Creatinine 1.61 0.96 - 1.10 mg/dL    BUN/Creatinine Ratio 27     EGFR CKD-EPI Non-African American, Male 23 >=60 mL/min/1.58m2    EGFR CKD-EPI African American, Male >90 >=60 mL/min/1.26m2    Glucose 84 70 - 179 mg/dL    Calcium 9.3 8.7 - 04.5 mg/dL   CBC   Result Value Ref Range    WBC 5.6 4.5 - 11.0 10*9/L    RBC 1.80 (L) 4.50 - 5.90 10*12/L    HGB 7.2 (L) 13.5 - 17.5 g/dL    HCT 40.9 (L) 81.1 - 53.0 % MCV 118.8 (H) 80.0 - 100.0 fL    MCH 40.2 (H) 26.0 - 34.0 pg    MCHC 33.8 31.0 - 37.0 g/dL    RDW 91.4 (H) 78.2 - 15.0 %    MPV 10.2 (H) 7.0 - 10.0 fL    Platelet 177 150 - 440 10*9/L    nRBC 14 (H) <=4 /100 WBCs    Results Verified by Slide Scan Slide Reviewed    ECG 12 lead   Result Value Ref Range    EKG Systolic BP  mmHg    EKG Diastolic BP  mmHg    EKG Ventricular Rate 53 BPM    EKG Atrial Rate 53 BPM    EKG P-R Interval 254 ms    EKG QRS Duration 114 ms    EKG Q-T Interval 508 ms    EKG QTC Calculation 476 ms    EKG Calculated P Axis 65 degrees    EKG Calculated R Axis 28 degrees    EKG Calculated T Axis 47 degrees    QTC Fredericia 487 ms   POCT Oximetry   Result Value Ref Range    Hemoglobin, POC 4.9 (LL) 13.5 - 17.5 g/dL    Oxyhemoglobin, POC 95.6 Interpret %   POCT Oximetry   Result Value Ref Range    Hemoglobin, POC 4.7 (LL) 13.5 - 17.5 g/dL    Oxyhemoglobin, POC 21.3 Interpret %         Avelino Leeds, MD    04/17/20 2:54 PM

## 2020-04-17 NOTE — Unmapped (Signed)
Triage completed. Pt reports will be located in the [place] [facility] [place].

## 2020-04-18 NOTE — Unmapped (Signed)
It was a pleasure to see you today in clinic!    Plan from today's visit  ??? Follow up in 2 weeks  ??? Continue medications as prescribed  ??? We will check labs and examine to make additional changes    How to get in touch with your Sickle Cell Team:     1. If you are sick, and need an ambulance, CALL 911.    ??? Otherwise, call [facility] at [phone], THEN OPTION 3, 8AM - 5PM, and ask for the NURSE TRIAGE LINE and leave a message   ??? On nights and weekends, for urgent problems you can call the [phone] and ask for the HEMATOLOGIST ON CALL.     2.  For an appointment, please call [facility] at [phone], 8AM - 5PM.    3.  For pain medication refills, leave a message on nurse triage line THREE days before refill needed. All other medication refills ask your pharmacy to fax request.       4.  For general questions, call the sickle cell office, [phone]. You can leave non-urgent messages here as well.    5.  For social work questions or needs; Please call Dickie La RN, MSW, MPH at [phone]

## 2020-04-18 NOTE — Unmapped (Signed)
Working to complete visit yesterday, which was cut off by technical problems.    Got a message to call [name] at 75, [phone], but this number did not work. I tried all other listed numbers, including [phone], [phone], and [phone] without success. I left a message and will try again tomorrow.

## 2020-04-23 DIAGNOSIS — G40909 Epilepsy, unspecified, not intractable, without status epilepticus: Principal | ICD-10-CM

## 2020-04-23 DIAGNOSIS — D571 Sickle-cell disease without crisis: Principal | ICD-10-CM

## 2020-04-25 NOTE — Unmapped (Signed)
Thank you for your Home Health referral.  Currently [facility] and [facility] are over capacity and unable to accommodate the ordered Home Health services for your patient  We apologize for this temporary status and any inconvenience it may cause.      K[place] Foil- Sherilyn Cooter, LPN   Central Intake Nurse for [facility] of [place], [place] and [place]

## 2020-05-01 ENCOUNTER — Ambulatory Visit: Admit: 2020-05-01 | Discharge: 2020-05-23 | Disposition: E | Payer: MEDICARE

## 2020-05-01 ENCOUNTER — Ambulatory Visit: Admit: 2020-05-01 | Payer: MEDICARE | Attending: Hematology | Primary: Hematology

## 2020-05-01 LAB — COMPREHENSIVE METABOLIC PANEL
ALBUMIN: 2.9 g/dL — ABNORMAL LOW (ref 3.4–5.0)
ALKALINE PHOSPHATASE: 264 U/L — ABNORMAL HIGH (ref 46–116)
AST (SGOT): 177 U/L — ABNORMAL HIGH (ref <=34)
CALCIUM: 9.4 mg/dL (ref 8.7–10.4)
CHLORIDE: 110 mmol/L — ABNORMAL HIGH (ref 98–107)
CO2: 18.6 mmol/L — ABNORMAL LOW (ref 20.0–31.0)
CREATININE: 1.3 mg/dL — ABNORMAL HIGH
EGFR CKD-EPI AA MALE: 75 mL/min/{1.73_m2} (ref >=60)
EGFR CKD-EPI NON-AA MALE: 65 mL/min/{1.73_m2} (ref >=60)
GLUCOSE RANDOM: 108 mg/dL (ref 70–179)
POTASSIUM: 5.2 mmol/L — ABNORMAL HIGH (ref 3.4–4.5)
SODIUM: 134 mmol/L — ABNORMAL LOW (ref 135–145)

## 2020-05-01 LAB — CBC W/ AUTO DIFF
BASOPHILS ABSOLUTE COUNT: 0.2 10*9/L — ABNORMAL HIGH (ref 0.0–0.1)
BASOPHILS RELATIVE PERCENT: 2.9 %
EOSINOPHILS ABSOLUTE COUNT: 0 10*9/L (ref 0.0–0.7)
EOSINOPHILS RELATIVE PERCENT: 0.2 %
HEMATOCRIT: 14.2 % — CL (ref 38.0–50.0)
HEMOGLOBIN: 5.3 g/dL — CL (ref 13.5–17.5)
LYMPHOCYTES ABSOLUTE COUNT: 3.4 10*9/L (ref 0.7–4.0)
LYMPHOCYTES RELATIVE PERCENT: 42.3 %
MEAN CORPUSCULAR VOLUME: 114.8 fL — ABNORMAL HIGH (ref 81.0–95.0)
MEAN PLATELET VOLUME: 9 fL (ref 7.0–10.0)
MONOCYTES ABSOLUTE COUNT: 0.7 10*9/L (ref 0.1–1.0)
NEUTROPHILS ABSOLUTE COUNT: 3.7 10*9/L (ref 1.7–7.7)
NEUTROPHILS RELATIVE PERCENT: 45.6 %
NUCLEATED RED BLOOD CELLS: 25 /100{WBCs} — ABNORMAL HIGH (ref <=4)
PLATELET COUNT: 71 10*9/L — ABNORMAL LOW (ref 150–450)
RED BLOOD CELL COUNT: 1.24 10*12/L — ABNORMAL LOW (ref 4.32–5.72)
RED CELL DISTRIBUTION WIDTH: 26.9 % — ABNORMAL HIGH (ref 12.0–15.0)
WBC ADJUSTED: 6.5 10*9/L (ref 3.5–10.5)

## 2020-05-01 LAB — SMEAR REVIEW

## 2020-05-01 LAB — URINALYSIS
BACTERIA: NONE SEEN /HPF
GLUCOSE UA: NEGATIVE
GRANULAR CASTS: 1 /LPF — ABNORMAL HIGH
HYALINE CASTS: 3 /LPF — ABNORMAL HIGH (ref 0–1)
LEUKOCYTE ESTERASE UA: NEGATIVE
NITRITE UA: NEGATIVE
PH UA: 5 (ref 5.0–9.0)
PROTEIN UA: NEGATIVE
RBC UA: 1 /HPF (ref <=3)
SPECIFIC GRAVITY UA: 1.013 (ref 1.003–1.030)
SQUAMOUS EPITHELIAL: <1 /HPF (ref 0–5)
TRANSITIONAL EPITHELIAL: <1 /HPF (ref 0–2)
UROBILINOGEN UA: 4 — AB
WBC UA: 1 /HPF (ref <=2)

## 2020-05-01 LAB — MEAN PLATELET VOLUME: Platelet mean volume:EntVol:Pt:Bld:Qn:Automated count: 9

## 2020-05-01 LAB — BLOOD GAS, VENOUS
HCO3 VENOUS: 20 mmol/L — ABNORMAL LOW (ref 22–27)
O2 SATURATION VENOUS: 58.5 % (ref 40.0–85.0)
PCO2 VENOUS: 40 mmHg (ref 40–60)
PH VENOUS: 7.31 — ABNORMAL LOW (ref 7.32–7.43)
PO2 VENOUS: 41 mmHg (ref 30–55)

## 2020-05-01 LAB — BILIRUBIN DIRECT: Bilirubin.glucuronidated+Bilirubin.albumin bound:MCnc:Pt:Ser/Plas:Qn:: 17 — ABNORMAL HIGH

## 2020-05-01 LAB — RETICULOCYTE ABSOLUTE COUNT, MANUAL: Lab: 176.9 — ABNORMAL HIGH

## 2020-05-01 LAB — HIGH SENSITIVITY TROPONIN I: Lab: 25

## 2020-05-01 LAB — LEUKOCYTE ESTERASE UA: Leukocyte esterase:PrThr:Pt:Urine:Ord:Test strip: NEGATIVE

## 2020-05-01 LAB — CREATININE: Creatinine:MCnc:Pt:Ser/Plas:Qn:: 1.3 — ABNORMAL HIGH

## 2020-05-01 LAB — CREATINE KINASE TOTAL: Creatine kinase:CCnc:Pt:Ser/Plas:Qn:: 47

## 2020-05-01 LAB — BASE EXCESS VENOUS: Base excess:SCnc:Pt:BldV:Qn:Calculated: -6 — ABNORMAL LOW

## 2020-05-01 NOTE — Unmapped (Signed)
Pt to triage via wc and reports increasing generalized edema x2 months. Pt reports a hx of kidney dx. Lethargic appearing in triage- states I can't get enough sleep.

## 2020-05-01 NOTE — Unmapped (Signed)
[facility] Emergency Department Provider Note    ED Clinical Impression     Final diagnoses:   Anemia, unspecified type (Primary)   Hb-SS disease with crisis (CMS-HCC)   Pulmonary hypertension (CMS-HCC)       ED Course, Assessment and Plan     Initial Clinical Impression:    May 01, 2020 12:27 PM   [name] [name] is a 49 y.o. 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  On exam, patient has diffuse abdominal distention but otherwise soft and non tender. 2+ pitting edema in the bilateral lower extremities up to the level of the knee. 1+ systolic murmur appreciated, greatest at left upper sternal boarder.    BP 133/69  - Pulse 63  - Temp 36.4 ??C (97.5 ??F)  - Resp 15  - SpO2 95%     On initial evaluation here patient otherwise hemodynamically stable with notable abdominal and lower extremity swelling and slight hypoxia to 88% on room air which improved to 100% with 2 L nasal cannula which is patient's baseline when he is sleeping.  With patient's significant pulmonary hypertension and progressive liver failure secondary to sickle cell disease suspect that his worsening shortness of breath is likely either secondary to edema versus worsening pulmonary hypertension however chest x-ray here without significant degree of edema.  Consider potential of acute chest in this patient however he has not experienced any significant chest pain at time of examination here and chest x-ray otherwise clear for any concerning findings suggesting acute chest.  While patient's anemia is at the low end of his baseline here at 5.3 there is concern given the significant increase in his bilirubin to 29.1 in conjunction with his progressive worsening shortness of breath as well as progressive worsening of his liver enzymes.  Have consulted with patient's hematologist oncologist with regards to his abnormal labs here who recommends admission to main campus for further evaluation by both hematology and hepatology.  _____________________________________________________________________    The case was discussed with the attending physician who is in agreement with the above assessment and plan    Additional Medical Decision Making     I have reviewed the vital signs and the nursing notes. Labs and radiology results that were available during my care of the patient were independently reviewed by me and considered in my medical decision making.   I independently visualized the EKG tracing if performed  I independently visualized the radiology images if performed  I reviewed the patient's prior medical records if available.  Additional history obtained from family if available    History     CHIEF COMPLAINT:   Chief Complaint   Patient presents with   ??? Edema       HPI: [name] [name] is a 49 y.o. 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 Patient reports 2 months of progressive swelling in his abdomen and bilateral lower extremities and generalized fatigue, worsening over the past few days. He reports dyspnea with exertion and states he is only able to walk 3-4 feet before stopping to catch his breath which is changes from his baseline as patient states just last week he was  able to walk around his entire yard without difficultly. Denies any abdominal pain or chest pain. No recent changes in appetite, nausea, or vomiting. Denies fevers.     PAST MEDICAL HISTORY/PAST SURGICAL HISTORY:   Past Medical History:   Diagnosis Date   ??? Anemia    ??? Foot pain    ??? 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 Hypertension    ??? Pulmonary hypertension (CMS-HCC)    ??? Seizures (CMS-HCC)    ??? Sickle cell anemia (CMS-HCC)    ??? Vitamin D deficiency        Past Surgical History:   Procedure Laterality Date   ??? PR ENDOSCOPIC US EXAM, ESOPH N/A 01/11/2020    Procedure: UGI ENDOSCOPY; WITH ENDOSCOPIC ULTRASOUND EXAMINATION LIMITED TO THE ESOPHAGUS;  Surgeon: Jules Husbands, MD;  [facility];  Service: Gastroenterology   ??? PR RIGHT HEART CATH O2 SATURATION & CARDIAC OUTPUT N/A 07/18/2017    Procedure: Right Heart Catheterization;  Surgeon: Zannie Cove, MD;  Location: [facility] CATH;  Service: Cardiology   ??? PR RIGHT HEART CATH O2 SATURATION & CARDIAC OUTPUT N/A 04/07/2020    Procedure: Right Heart Catheterization;  Surgeon: Zannie Cove, MD;  Location: [facility] CATH;  Service: Cardiology       MEDICATIONS:   No current facility-administered medications for this encounter.    Current Outpatient Medications:   ???  carBAMazepine (TEGRETOL  XR) 400 MG 12 hr tablet, Take 1 tablet in the morning and 2 tablets in the evening, Disp: 90 tablet, Rfl: 11  ???  cholecalciferol, vitamin D3, (VITAMIN D3) 10 mcg (400 unit) cap, Take 1 capsule (400 Units total) by mouth daily., Disp: 30 each, Rfl: 0  ???  diclofenac sodium (VOLTAREN) 1 % gel, Apply 2 g topically daily as needed for arthritis., Disp: 100 g, Rfl: prn  ???  empty container (SHARPS CONTAINER) Misc, use as directed, Disp: 1 each, Rfl: 2  ???  epoetin alfa (PROCRIT) 40,000 unit/mL injection, Inject 1 mL (40,000 Units total) under the skin Two (2) times a week., Disp: 8 mL, Rfl: 3  ???  folic acid (FOLVITE) 1 MG tablet, TAKE 1 TABLET BY MOUTH EVERY DAY, Disp: 30 tablet, Rfl: 11  ???  furosemide (LASIX) 20 MG tablet, TAKE 2 TABLETS BY MOUTH EVERY DAY, Disp: 60 tablet, Rfl: 0  ???  lactulose (CONSTULOSE) 10 gram/15 mL solution, Take 45 mL (30 g total) by mouth Three (3) times a day., Disp: 4050 mL, Rfl: 3  ???  lidocaine (LIDODERM) 5 % patch, Place 1 patch on the skin. (Patient not taking: Reported on 04/07/2020), Disp: , Rfl:   ???  oxyCODONE (ROXICODONE) 5 MG immediate release tablet, Take 1 tablet (5 mg total) by mouth every six (6) hours as needed for pain., Disp: 20 tablet, Rfl: 0  ???  OXYGEN-AIR DELIVERY SYSTEMS MISC, 2 L/min by Each Nare route nightly. [employer], Disp: , Rfl:   ???  phenytoin (DILANTIN) 100 MG ER capsule, Take 1 capsule in the morning and 2 capsules in the evening (Patient taking differently: 100 mg Two (2) times a day. Take 1 capsule in the morning and 1 capsules in the evening), Disp: 90 capsule, Rfl: 11  ???  sodium polystyrene sulfonate (SODIUM POLYSTYRENE) 15 gram/60 mL Susp, Take 15 g by mouth daily., Disp: , Rfl:   ???  spironolactone (ALDACTONE) 25 MG tablet, Take 1 tablet (25 mg total) by mouth daily. (Patient not taking: Reported on 04/17/2020), Disp:  90 tablet, Rfl: 3  ???  syringe with needle (BD TUBERCULIN SYRINGE) 1 mL 27 x 1/2 Syrg, Use as directed twice weekly, Disp: 24 each, Rfl: 3    ALLERGIES:   Patient has no known allergies.    SOCIAL HISTORY:   Social History     Tobacco Use   ??? Smoking status: Current Every Day Smoker     Types: Cigars   ??? Smokeless tobacco: Never Used   ??? Tobacco comment: 1 cigar daily   Substance Use Topics   ??? Alcohol use: Not Currently     Alcohol/week: 0.0 standard drinks     Comment: occasional       FAMILY HISTORY:  Family History   Problem Relation Age of Onset   ??? Hypertension Mother    ??? Hypertension Father    ??? No Known Problems Sister    ??? No Known Problems Brother    ??? No Known Problems Brother    ??? No Known Problems Brother    ??? No Known Problems Brother    ??? Sickle cell anemia Sister    ??? No Known Problems Sister    ??? Clotting disorder Paternal Grandmother    ??? No Known Problems Maternal Aunt    ??? No Known Problems Maternal Uncle    ??? No Known Problems Paternal Aunt    ??? No Known Problems Paternal Uncle    ??? No Known Problems Maternal Grandmother    ??? No Known Problems Maternal Grandfather    ??? No Known Problems Paternal Grandfather    ??? No Known Problems Other    ??? Seizures Neg Hx    ??? Pulmonary Hypertension Neg Hx    ??? Autoimmune disease Neg Hx    ??? Venous thrombosis Neg Hx    ??? Amblyopia Neg Hx    ??? Blindness Neg Hx    ??? Cancer Neg Hx    ??? Cataracts Neg Hx    ??? Diabetes Neg Hx    ??? Glaucoma Neg Hx    ??? Macular degeneration Neg Hx    ??? Retinal detachment Neg Hx    ??? Strabismus Neg Hx    ??? Stroke Neg Hx    ??? Thyroid disease Neg Hx    ??? Anesthesia problems Neg Hx    ??? Broken bones Neg Hx    ??? Collagen disease Neg Hx    ??? Dislocations Neg Hx    ??? Fibromyalgia Neg Hx    ??? Gout Neg Hx    ??? Hemophilia Neg Hx    ??? Osteoporosis Neg Hx    ??? Rheumatologic disease Neg Hx    ??? Scoliosis Neg Hx    ??? Severe sprains Neg Hx    ??? Spinal Compression Fracture Neg Hx       Review of Systems    A 10 point review of systems was performed and is negative other than positive elements noted in HPI   Constitutional: Negative for fever/chills. Positive for generalized fatigue.  Eyes: Negative for visual changes.  ENT: Negative for sore throat.  Cardiovascular: Negative for chest pain.  Respiratory: Negative for shortness of breath.  Gastrointestinal: Positive for abdominal distention. Negative for vomiting, Negative or diarrhea.  Genitourinary: Negative for dysuria or frequency changes.   Musculoskeletal: Positive for BLE swelling.   Skin: Negative for rash.  Neurological: Negative for headaches, negative for focal weakness or numbness.    Physical Exam     VITAL SIGNS:  BP 133/69  - Pulse 63  - Temp 36.4 ??C (97.5 ??F)  - Resp 15  - SpO2 95%       This provider entered the patient's room: Yes    ??? If this provider did not enter the room, a comprehensive physical exam was not able to be performed due to increased infection risk to themselves, other providers, staff and other patients), as well as to conserve personal protective equipment (PPE) utilization during the COVID-19 pandemic.    ??? If this provider did enter the patient room, the following appropriate PPE was worn including but not limited to: Surgical mask, eye protection and gloves,  Surgical mask, eye protection, gown and gloves,  N95, eye protection and gloves,  N95, eye protection, gown and gloves,  N95, eye protection, cap, gown and gloves,  CAPR with face shield, gown and gloves      Constitutional:   ??? Alert and oriented.   Head:   ??? Normocephalic and atraumatic  Eyes:   ??? Conjunctivae are normal, EOMI, PERRL  ENT:   ??? No notable congestion, Mucous membranes moist, External ears normal, no notable stridor  Cardiovascular:   ??? Rate as vitals above, with 1+ systolic murmur appreciated, greatest at left upper sternal boarder. Normal and symmetric distal pulses are present in all extremities.   Respiratory:   ??? Normal respiratory effort. Breath sounds are normal.  Gastrointestinal:   ??? Diffuse abdominal distention but otherwise soft and non tender.  Genitourinary:   ??? Deferred  Musculoskeletal:    ??? Normal range of motion in all extremities. 2+ pitting edema in the bilateral lower extremities up to the level of the knee.  Neurologic:   ??? Normal speech and language. No gross focal neurologic deficits are appreciated.  Skin:   ??? Skin is warm, dry and intact. No rashes noted.  Psychiatric:   ??? Mood and affect are normal. Speech and behavior are normal.      Radiology     XR Chest 2 views    (Results Pending)       Labs     Labs Reviewed   COMPREHENSIVE METABOLIC PANEL - Abnormal; Notable for the following components:       Result Value    Sodium 134 (*)     Potassium 5.2 (*)     Chloride 110 (*)     CO2 18.6 (*)     Creatinine 1.30 (*)     Albumin 2.9 (*)     Total Bilirubin 29.1 (*)     AST 177 (*)     Alkaline Phosphatase 264 (*)     All other components within normal limits   CBC W/ AUTO DIFF - Abnormal; Notable for the following components:    RBC 1.24 (*)     HGB 5.3 (*)     HCT 14.2 (*)     MCV 114.8 (*)     MCH 42.9 (*)     MCHC 37.4 (*)     RDW 26.9 (*)     Platelet 71 (*)     nRBC 25 (*)     Absolute Basophils 0.2 (*) Macrocytosis Moderate (*)     Anisocytosis Marked (*)     All other components within normal limits   SLIDE REVIEW - Abnormal; Notable for the following components:    Smear Review Comments See Comment (*)     All other components within normal limits   CBC W/ DIFFERENTIAL  Narrative:     The following orders were created for panel order CBC w/ Differential.  Procedure                               Abnormality         Status                     ---------                               -----------         ------                     CBC w/ Differential[[id number]]         Abnormal            Final result               Morphology Review[[id number]]           Abnormal            Final result                 Please view results for these tests on the individual orders.   EXTRA TUBES    Narrative:     The following orders were created for panel order ED Extra Tubes.  Procedure                               Abnormality         Status                     ---------                               -----------         ------                     LIGHT BLUE CITRATE EXTR.Marland KitchenMarland Kitchen[[id number]]                                                   Please view results for these tests on the individual orders.   RETICULOCYTES   LIGHT BLUE CITRATE EXTRA TUBE       Pertinent labs & imaging results that were available during my care of the patient were reviewed by me and considered in my medical decision making (see chart for details).    Please note- This chart has been created using [employer]. Chart creation errors have been sought, but may not always be located and such creation errors, especially pronoun confusion, do NOT reflect on the standard of medical care.    Documentation assistance was provided by Lenn Sink, Scribe, on May 01, 2020 at 12:27 PM for Cindy Hazy, MD.    Documentation assistance was provided by the scribe in my presence.  The documentation recorded by the scribe has been reviewed by me and accurately reflects the services I personally performed.    Cindy Hazy, MD  PGY2 Emergency Medicine         Eliberto Ivory  Julius Bowels  Resident  08-May-2020 2053

## 2020-05-02 LAB — SODIUM URINE: Lab: <10

## 2020-05-02 LAB — UREA NITROGEN URINE: Lab: 531

## 2020-05-02 LAB — CBC
HEMATOCRIT: 15.1 % — ABNORMAL LOW (ref 41.0–53.0)
HEMOGLOBIN: 5.2 g/dL — ABNORMAL LOW (ref 13.5–17.5)
MEAN CORPUSCULAR HEMOGLOBIN CONC: 34.2 g/dL (ref 31.0–37.0)
MEAN CORPUSCULAR HEMOGLOBIN: 41.7 pg — ABNORMAL HIGH (ref 26.0–34.0)
MEAN CORPUSCULAR VOLUME: 121.9 fL — ABNORMAL HIGH (ref 80.0–100.0)
PLATELET COUNT: 81 10*9/L — ABNORMAL LOW (ref 150–440)
RED BLOOD CELL COUNT: 1.24 10*12/L — ABNORMAL LOW (ref 4.50–5.90)
RED CELL DISTRIBUTION WIDTH: 22.2 % — ABNORMAL HIGH (ref 12.0–15.0)

## 2020-05-02 LAB — INR: Coagulation tissue factor induced.INR:RelTime:Pt:PPP:Qn:Coag: 2.13

## 2020-05-02 LAB — NUCLEATED RED BLOOD CELLS: Lab: 31 — ABNORMAL HIGH

## 2020-05-02 LAB — CREATININE, URINE
CREATININE, URINE: 83 mg/dL
Lab: 83

## 2020-05-02 LAB — BASIC METABOLIC PANEL
ANION GAP: 10 mmol/L (ref 5–14)
CALCIUM: 8.9 mg/dL (ref 8.7–10.4)
CHLORIDE: 105 mmol/L (ref 98–107)
CO2: 18 mmol/L — ABNORMAL LOW (ref 20.0–31.0)
EGFR CKD-EPI AA MALE: 84 mL/min/{1.73_m2} (ref >=60)
EGFR CKD-EPI NON-AA MALE: 72 mL/min/{1.73_m2} (ref >=60)
POTASSIUM: 5 mmol/L — ABNORMAL HIGH (ref 3.4–4.5)
SODIUM: 133 mmol/L — ABNORMAL LOW (ref 135–145)

## 2020-05-02 LAB — MAGNESIUM: Magnesium:MCnc:Pt:Ser/Plas:Qn:: 1.9

## 2020-05-02 LAB — APTT: Coagulation surface induced:Time:Pt:PPP:Qn:Coag: 42.4 — ABNORMAL HIGH

## 2020-05-02 LAB — CALCIUM: Calcium:MCnc:Pt:Ser/Plas:Qn:: 8.9

## 2020-05-02 LAB — FIBRINOGEN LEVEL: Fibrinogen:MCnc:Pt:PPP:Qn:Coag: 122 — ABNORMAL LOW

## 2020-05-02 LAB — PROTIME-INR: INR: 2.13

## 2020-05-02 LAB — LACTATE DEHYDROGENASE: Lactate dehydrogenase:CCnc:Pt:Ser/Plas:Qn:Reaction: pyruvate to lactate: 1260 — ABNORMAL HIGH

## 2020-05-02 MED ADMIN — spironolactone (ALDACTONE) tablet 25 mg: 25 mg | ORAL | @ 23:00:00

## 2020-05-02 MED ADMIN — cholecalciferol (vitamin D3-10 mcg (400 unit)) tablet 10 mcg: 10 ug | ORAL | @ 14:00:00

## 2020-05-02 MED ADMIN — phytonadione (vitamin K1) (MEPHYTON) tablet 10 mg: 10 mg | ORAL | @ 23:00:00 | Stop: 2020-05-05

## 2020-05-02 MED ADMIN — phenytoin (DILANTIN) ER capsule 100 mg: 100 mg | ORAL | @ 14:00:00

## 2020-05-02 MED ADMIN — epoetin alfa-EPBX (RETACRIT) injection 40,000 Units: 40000 [IU] | SUBCUTANEOUS | @ 05:00:00 | Stop: 2020-05-02

## 2020-05-02 MED ADMIN — lactulose (CHRONULAC) oral solution: 30 g | ORAL | @ 02:00:00

## 2020-05-02 MED ADMIN — polyethylene glycol (MIRALAX) packet 17 g: 17 g | ORAL | @ 14:00:00

## 2020-05-02 MED ADMIN — carBAMazepine (TEGretol  XR) 12 hr tablet 400 mg: 400 mg | ORAL | @ 14:00:00

## 2020-05-02 MED ADMIN — enoxaparin (LOVENOX) syringe 40 mg: 40 mg | SUBCUTANEOUS | @ 16:00:00

## 2020-05-02 MED ADMIN — melatonin tablet 3 mg: 3 mg | ORAL | @ 03:00:00

## 2020-05-02 NOTE — Unmapped (Cosign Needed)
HEPATOLOGY INPATIENT CONSULTATION H&P      Requesting Attending Physician:  Vonda Antigua, MD  Requesting Consult Service: MED U    Reason for Consult:    [name] [name] is a 49 y.o. male seen in consultation at the request of Dr. Vonda Antigua, MD for liver disease.    ASSESSMENT / PLAN     49 y.o. male with pmh sickle cell anemia, pHTN, congestive hepatopathy with fibrosis and portal HTN, epilepsy who presented with volume overload, DOE, weakness. Also rising bilirubin.     Suspect his chronic liver disease is a combination of congestive hepatopathy due to pHTN (biopsy confirms this) and sickle cell hepatopathy. Etiology of acute worsening in liver function with rising bilirubin is unclear. However suspect sickle cell hepatopathy playing a large role. He recently had R heart cath that showed only mild pulm HTN and RA pressure of 8 so worsening congestion from pulmonary HTN unlikely to be contributing. Also consider other more acute insults such as DILI, GI bleeding, infection but no obvious culprits seen. His worsening ascites and LE edema is likely due to worsening liver disease.     Recommend hematology consult for optimization of his sickle cell disease given this is likely driving some of the worsening liver function we are seeing. For his volume overload and ascites we should perform diagnostic para. Can also perform LVP. Continue home diuretics for now but may need to increase these in the coming days.    RECOMMENDATIONS:  - hematology consult for management of SCD  - agree with liver US with doppler  - diagnostic para, please send total protein, albumin, cell count/diff, culture  - can also perform therapeutic para for comfort, if more than 4L removed give 8g 25% albumin for every 1L removed  - 2gm sodium diet  - consult nutrition  - continue lasix 40mg  daily and spironolactone 25mg  daily  - IV Vit K 10mg  IV x 3 days  - trend daily CMP and INR  - continue home lactulose, titrate to 2-5 BM daily  - outpatient EGD for EV screening    Thank you for this consult. The patient was Discussed with and seen by Dr. Piedad Climes. We will continue to follow along. Please page hepatology fellow on call with questions.     [name] Bible, MD  Gastroenterology & Hepatology Fellow, PGY-5  [facility]        SUBJECTIVE:     Chief Complaint/Reason for Consult: liver disease    History of Present Illness:   This is a 49 y.o. male with pmh sickle cell anemia, pHTN, congestive hepatopathy with fibrosis and portal HTN, epilepsy who presented with volume overload, DOE, weakness.     Presented with two months worsening LE edema, DOE, abdominal distension. Has been on lasix 40mg  daily, spiro 25mg  daily. Denies abd pain, blood in stool, nausea, vomiting.     Review of Systems:  The balance of 12 systems reviewed is negative except as noted in the HPI.     MEDICAL HISTORY:     Past Medical History:  Past Medical History:   Diagnosis Date   ??? Anemia    ??? Foot pain    ??? 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 Hypertension    ??? Pulmonary hypertension (CMS-HCC)    ??? Seizures (CMS-HCC)    ??? Sickle cell anemia (CMS-HCC)    ??? Sickle cell trait (CMS-HCC)    ??? Vitamin D deficiency  Surgical History:  Past Surgical History:   Procedure Laterality Date   ??? PR ENDOSCOPIC US EXAM, ESOPH N/A 01/11/2020    Procedure: UGI ENDOSCOPY; WITH ENDOSCOPIC ULTRASOUND EXAMINATION LIMITED TO THE ESOPHAGUS;  Surgeon: Jules Husbands, MD;  [facility];  Service: Gastroenterology   ??? PR RIGHT HEART CATH O2 SATURATION & CARDIAC OUTPUT N/A 07/18/2017    Procedure: Right Heart Catheterization;  Surgeon: Zannie Cove, MD;  Location: [facility] C[facility];  Service: Cardiology   ??? PR RIGHT HEART CATH O2 SATURATION & CARDIAC OUTPUT N/A 04/07/2020    Procedure: Right Heart Catheterization;  Surgeon: Zannie Cove, MD;  Location: [facility] CATH;  Service: Cardiology       Family History:    Family History Problem Relation Age of Onset   ??? Hypertension Mother    ??? Hypertension Father    ??? No Known Problems Sister    ??? No Known Problems Brother    ??? No Known Problems Brother    ??? No Known Problems Brother    ??? No Known Problems Brother    ??? Sickle cell anemia Sister    ??? No Known Problems Sister    ??? Clotting disorder Paternal Grandmother    ??? No Known Problems Maternal Aunt    ??? No Known Problems Maternal Uncle    ??? No Known Problems Paternal Aunt    ??? No Known Problems Paternal Uncle    ??? No Known Problems Maternal Grandmother    ??? No Known Problems Maternal Grandfather    ??? No Known Problems Paternal Grandfather    ??? No Known Problems Other    ??? Seizures Neg Hx    ??? Pulmonary Hypertension Neg Hx    ??? Autoimmune disease Neg Hx    ??? Venous thrombosis Neg Hx    ??? Amblyopia Neg Hx    ??? Blindness Neg Hx    ??? Cancer Neg Hx    ??? Cataracts Neg Hx    ??? Diabetes Neg Hx    ??? Glaucoma Neg Hx    ??? Macular degeneration Neg Hx    ??? Retinal detachment Neg Hx    ??? Strabismus Neg Hx    ??? Stroke Neg Hx    ??? Thyroid disease Neg Hx    ??? Anesthesia problems Neg Hx    ??? Broken bones Neg Hx    ??? Collagen disease Neg Hx    ??? Dislocations Neg Hx    ??? Fibromyalgia Neg Hx    ??? Gout Neg Hx    ??? Hemophilia Neg Hx    ??? Osteoporosis Neg Hx    ??? Rheumatologic disease Neg Hx    ??? Scoliosis Neg Hx    ??? Severe sprains Neg Hx    ??? Spinal Compression Fracture Neg Hx        Medications:   Current Facility-Administered Medications   Medication Dose Route Frequency Provider Last Rate Last Admin   ??? acetaminophen (TYLENOL) tablet 500 mg  500 mg Oral Q4H PRN Judd Lien, MD       ??? aluminum-magnesium hydroxide-simethicone (MAALOX MAX) 80-80-8 mg/mL oral suspension  30 mL Oral Q4H PRN Judd Lien, MD       ??? carBAMazepine (TEGretol  XR) 12 hr tablet 400 mg  400 mg Oral Daily Judd Lien, MD       ??? carBAMazepine (TEGretol  XR) 12 hr tablet 800 mg  800 mg Oral Nightly Darryl B Kalil,  MD   800 mg at 05/09/2020 2223   ??? cholecalciferol (vitamin D3-10 mcg (400 unit)) tablet 10 mcg  10 mcg Oral Daily Judd Lien, MD       ??? diclofenac sodium (VOLTAREN) 1 % gel 2 g  2 g Topical Daily PRN Judd Lien, MD       ??? enoxaparin (LOVENOX) syringe 40 mg  40 mg Subcutaneous Q24H SCH Darryl B Kalil, MD       ??? epoetin alfa-EPBX (RETACRIT) injection 40,000 Units  40,000 Units Subcutaneous Once per day on Mon Thu Judd Lien, MD   40,000 Units at 05/02/20 0126   ??? folic acid (FOLVITE) tablet 1,000 mcg  1,000 mcg Oral Daily Judd Lien, MD       ??? guaiFENesin (ROBITUSSIN) oral syrup  200 mg Oral Q4H PRN Judd Lien, MD       ??? HYDROmorphone (PF) (DILAUDID) injection 0.25 mg  0.25 mg Intravenous Q4H PRN Judd Lien, MD       ??? lactulose (CHRONULAC) oral solution (30 mL cup)  30 g Oral TID Judd Lien, MD   30 g at 05/09/2020 2229   ??? melatonin tablet 3 mg  3 mg Oral Nightly PRN Judd Lien, MD   3 mg at 05-09-20 2304   ??? ondansetron (ZOFRAN-ODT) disintegrating tablet 4 mg  4 mg Oral Q8H PRN Judd Lien, MD        Or   ??? ondansetron (ZOFRAN) injection 4 mg  4 mg Intravenous Q8H PRN Judd Lien, MD       ??? phenytoin (DILANTIN) ER capsule 100 mg  100 mg Oral BID Judd Lien, MD   100 mg at 05/09/2020 2223   ??? polyethylene glycol (MIRALAX) packet 17 g  17 g Oral Daily Judd Lien, MD       ??? senna (SENOKOT) tablet 2 tablet  2 tablet Oral Nightly PRN Judd Lien, MD       ??? traMADoL (ULTRAM) tablet 50 mg  50 mg Oral Q4H PRN Judd Lien, MD           Allergies:   Patient has no known allergies.    Social History:  - denies alcohol, toboacco, drug use    Objective:      Vital Signs/Weight:  Temp:  [35.1 ??C-36.4 ??C] 35.1 ??C  Heart Rate:  [52-63] 56  SpO2 Pulse:  [51-64] 53  Resp:  [12-22] 18  BP: (110-133)/(57-80) 125/58  MAP (mmHg):  [80-95] 80  SpO2:  [88 %-100 %] 100 %  Wt Readings from Last 3 Encounters:   04/07/20 64.7 kg (142 lb 9.6 oz)   03/14/20 64.2 kg (141 lb 8 oz)   03/06/20 68 kg (150 lb)       Physical Exam:  Constitutional: No acute distress  HENT: MMM  Eyes: + icteric sclerae  CV: regular  Lung: normal WOB  Abdomen: soft, normal bowel sounds, distended, non tender  Extremities: Warm, 2+ LE edema  Skin:+ jaundice  Neuro: Alert, Oriented x 3, No focal deficits. + mild asterixis.   Psych: Thought organized, appropriate affect, pleasantly interactive, not anxious appearing      DIAGNOSTIC STUDIES     I reviewed all pertinent diagnostic studies, including:      Labs:    Recent Labs     05-09-20  1150   WBC 6.5   HGB 5.3*   HCT 14.2*   PLT 71*  Recent Labs       1150   NA 134*   K 5.2*   CL 110*   CREATININE 1.30*   GLU 108     Recent Labs     05/07/2020  1150   ALBUMIN 2.9*   AST 177*   ALT 43   ALKPHOS 264*   BILITOT 29.1*     No results for input(s): INR, APTT, FIBRINOGEN in the last 72 hours.  No results for input(s): CRP in the last 72 hours.  No results for input(s): IRON, TIBC, FERRITIN in the last 72 hours.    Final Diagnosis   A: Liver, biopsy  - Prominent panlobular sinusoidal congestion with hepatic plate atrophy and mild???moderate cholestasis with focal feathery degeneration of hepatocytes  - Portal tracts with reactive bile ductular proliferation and mild lymphocytic inflammation  - Trichrome stain shows robust perisinusoidal fibrosis and increased portal fibrosis with periportal septae (stage 2)  - No significant steatosis identified  - See comment   ??  ??  This electronic signature is attestation that the pathologist personally reviewed the submitted material(s) and the final diagnosis reflects that evaluation.   Electronically signed by Lyla Glassing, MD on 01/17/2020 at 1532   Comment    Microscopic examination demonstrates 3 cores of hepatic parenchyma with greatjer tjam 10 portal tracts distributed within them.  While the overall hepatic architecture is intact, there is extensive panlobular expansion of the sinusoids secondary to congestion.  Red blood cells are frequently clumped, but sickle cell morphology appears mild.  There is accompanying mild???moderate lobular cholestasis with patchy feathery degeneration of hepatocytes..  No significant steatosis, viral cytopathic, or granuloma are seen.  Portal tracts show expansion by reactive bile ductular proliferation with accompanying mild lymphocytic infiltrate with scant interface activity.  No significant plasma cell population is seen.    ??  Stains are performed on block A1.  Trichrome stain shows extensive perisinusoidal fibrosis and portal expansion by fibrosis with periportal septae; there are focal suggestions of early bridging fibrosis.  Reticulin stain shows hepatic plates 1-2 cells thick with areas of condensation suggestive of hepatocyte atrophy.  A PASD stain demonstrates PAS positive material predominantly in Kupffer cells.  Focally moderately increased hepatocyte iron (grade 3) is seen on iron stain.  Immunohistochemical stain for CK19 outlines the reactive bile ductular proliferation and shows bile ducts associated with the majority of the portal tracts.  A copper stain shows focal copper moderate deposition in the hepatocytes.     The overall appearance of the liver with congestion, sinusoidal fibrosis, and hepatocyte atrophy are suggestive of outflow obstruction and are likely related to/exacerbated by the patient's history of sickle cell anemia.         Imaging:     MRI/MRCP 01/11/20:  ??  HEPATOBILIARY: The liver is enlarged with mildly nodular contour. Gallbladder is distended with dependent gallstones and pericholecystic fluid. There is mild gallbladder wall thickening. On MRCP images, no intrahepatic biliary dilatation is present. There is mild periportal edema. There is dilation of the common bile duct up to 0.9 cm with abrupt narrowing (4:24), however there is no definite evidence of obstructing lesion.   PANCREAS: Unremarkable. No dilatation of the main pancreatic duct.  SPLEEN: Diffuse low signal intensity and susceptibility artifact is noted, consistent with infarcted and calcified spleen.  ADRENAL GLANDS: Unremarkable.  KIDNEYS/URETERS: Horseshoe kidney. Bilateral T2 hyperintense subcentimeter lesions, consistent with cysts.  BOWEL/PERITONEUM/RETROPERITONEUM: No bowel obstruction. No acute inflammatory process. Small volume abdominopelvic ascites.  VASCULATURE: Abdominal aorta within normal limits for patient's age. Unremarkable inferior vena cava.  LYMPH NODES: No adenopathy.  ??  BONES/SOFT TISSUES: Diffusely decreased T1 marrow signal, which may be secondary to marrow replacement. H-shaped vertebral bodies.  ??  IMPRESSION:  Limited study secondary to use of noncooperative protocol.  ??  -- Cholelithiasis and gallbladder distention with mild wall thickening, which is again non-specific given underlying chronic liver disease and small volume ascites.   -- Mild dilation of the common bile duct with distal narrowing, however no definite evidence of choledocholithiasis or obstructing lesion. No intrahepatic biliary ductal dilation.  -- Hepatomegaly with nodular liver contour, suggestive of chronic liver disease.    RHC 04/07/20:  Conclusions:  ?? Mild pulmonary hypertension, secondary to high output state. PVR is within normal limits (1.7 WU).  ?? Mildly elevated RA pressure (8 mmHg, no respiratory variation), not high enough to be responsible for ascites/portal hypertension.  ?? Elevated cardiac output/index (CI 5.3 L/min/m2), in keeping with known anemia from SCD.  ?? Severe anemia (Hgb 4.9, repeated) noted on blood gas for Fick calculations; peripherally drawn Hgb 7.2 prior to procedure, unclear etiology of this discrepancy.  ?? In comparison to prior catheterization from 06/2017, there is no significant change in hemodynamics.  ??  Plan:  ?? No indication for [facility] specific therapy. Continued care for liver disease, which at this point appears to be SCD related and not secondary to right heart failure. Okay for home today.  ??   Microbiology:  covid negative May 26, 2020    GI Procedures:      EUS 12/2019:  Impression:            - There was no sign of significant pathology in the                          entire main bile duct.                         - There was diffuse abnormal echotexture in the                          visualized portion of the liver. This was                          characterized by a hyperechoic appearance and a                          lobulated appearance.                         - There was no sign of significant pathology in the                          gallbladder body on today's examination- could be due                          to difference in modality given he has had sludge and                          stones on RUQ U/S and MRCP.                         -  There was no sign of significant pathology in the                          entire pancreas.                         - Ascites.                         - A few benign lymph nodes were visualized in the                          peripancreatic region and porta hepatis region.                         - A small amount of food (residue) in the stomach.                         - No specimens collected.  Recommendation:        - Return patient to hospital ward for ongoing care.                         - Continue present medications.                         - Observe patient's clinical course.                         - Refer to hepatology team inpatient or outpatient at                          the next available appointment.

## 2020-05-02 NOTE — Unmapped (Addendum)
Volume Overload I Cholestatic Hepatopathy  The overall appearance of the liver with congestion, sinusoidal fibrosis, and hepatocyte atrophy are suggestive of outflow obstruction and are likely related to/exacerbated by the patient's history of sickle cell anemia    Overall read was that these findings are consistent with exacerbation from his sickle cell disease. During the TJ biopsy previously performed, simultaneous measurement of hepatic wedge pressure demonstrated elevation consistent with portal hypertension.    Following hepatology rec's, awaiting MRCP. Confusion and asterixis worsens when not meeting goal BMs. Patient is alert and oriented x3 today and reports having 5 BMs overnight.  Patient tends to become encephalopathic when he is not adherent to his lactulose regimen.  Need to adjust regimen to produce 2-5 BMs daily, avoid dosing in evening to promote sleep.  - 2gm sodium diet  - nutrition consulted  - continue lasix 40mg  daily   - trend INR (per heme rec's below, will do every other day)  - continue home lactulose, titrate to 2-5 BM daily  - MRCP for elevated bili  - outpatient EGD for EV screening given protal hypertension  ??  Sickle Cell Anemia, Hgb SS   Following hematology rec's. Patient can no longer be transfused, as he has multiple allo-antibodies. HGB 4.5 yesterday and downtrending.  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 Potassium stable.   - strict I/O   - daily weights  - no spironolactone   - CMP every other day  ??  Generalized Weakness I Failure to Thrive  PT recommendation 5x/low. Will discuss possibility of SNF with mother and case manager tomorrow.  - PT/OT

## 2020-05-02 NOTE — Unmapped (Signed)
Internal Medicine (MEDU) History & Physical  This note was written with the assistance of medical student Aneesh Rahangdale. I attest that I have reviewed the student note and that the components of the history of the present illness, the physical exam, and the assessment and plan documented were performed by me or were performed in my presence by the student where I verified the documentation and performed (or re-performed) the exam and medical decision making.    Assessment & Plan:   [name] [name] is a 49 y.o. 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     Active Problems:    Volume Overload/Congestive Hepatopathy    Sickle Cell Anemia    Acute Kidney Injury    Generalized Weakness/Failure to Thrive    Concern for Upper GI Bleed    Resolved Problems:    * No resolved hospital problems. *      Volume Overload/Cholestatic Hepatopathy  Has been followed in outpatient setting for worsening liver function in recent months, May 2021 liver biopsy with sinusoidal congestion, moderate cholestasis overall felt to be related to/exacerbated by sickle cell disease.  Admitted now because has had worsening abdominal distention the past few days and two months of worsening b/l lower extremity edema, now 3+ up to the knees. Moreover, he has signs of hepatic encephalopathy (asterixis, cognitive slowing in verbal responsiveness, memory slowing) and has not taken his lactulose recently because it makes him nauseated.  In the context of his Sickle Cell Disease and worsening liver function (icteric sclera, TBili 29, elevated AST 177/AlkPhos 264, albumin 2.9), there is concern for hepatocellular damage and hepatic sequestration. My suspicion for hepatic sequestration is lower given the lack of hepatomegaly or RUQ tenderness, although this should still be considered given the decreased platelets (71) and hemoglobin (5.3). Recent RHC demonstrating only mild pHTN (mean PA 29) , RA pressure 8 , not likely to be contributing to volume overload.   -liver doppler   - BNP  -Med M consult for diagnostic paracentesis   -hepatology consult  -continue to monitor LFTs (albumin, PT/INR, AST/ALT)   -hold diuretics pending clarification of renal function as below   -restart lactulose     Sickle Cell Anemia, Hgb SS    Patient has a history of Sickle Cell Anemia , typical baseline 5.5-6.5 .  Does not tolerate hydroxyurea , recently on voxeletor but discontinued due to lack of improvement in sxs. Currently only treating with EPO shots twice a week and folic acid. He has not gotten his shot in over a week, so we will give him the EPO shot here. Given his symptomatic anemia (fatigue, SOB, DOE) and Hb of 5.3, we will likely need to transfuse him, but his history of allo antibodies will require further discussion with blood bank and hematology prior to transfusions   -EPO 40k units twice weekly, will dose tonight   -continue folate   -consult hematology, blood bank    Oliguric AKI   Patient has an elevated Cr (1.3 from baseline 0.8) in addition to his liver dysfunction, volume overload.  Has documented proteinuria previously but typically normal GFR. His history is not clear if he has been taking his diuretics or not as he is mildly confused, regardless will hold initially on admission.   -U/A, bladder scan, renal  U/S  - strict I/O   - hx consistent with oliguria , will check FeNa and FeUrea   - CK     Pulmonary HTN / Hypoxia: Stable on RHC. Likely not contributing to worsening liver dysfunction.  -  Currently prescribed 2-4L at night    Generalized Weakness/Failure to Thrive. This is likely a manifestation of his anemia but could also be a result of worsening liver disease, undertreatment of hepatic encephalopathy, or electrolyte abnormalities. We will continue to monitor for signs of infection and active bleeding, although this is less likely given the normal WBC (6.5).  -continue regular diet   -CTM CBC, CMP  - PT/OT   - check dilantin , carbamezapine level     Concern for Upper GI Bleed. Seems less likely but given his history of an episode of hematemesis 2 days ago (9/7) we will continue to monitor for signs or symptoms of an upper GI bleed.  -CBC, H/H     Hx of seizures and cognitive slowing 2/2 SCD   - continue home phenytoin and carbamazepine     Daily Checklist:  Diet:  Low Na diet   DVT PPx: Contradindicated - Thrombocytopenic  Electrolytes: Replete Potassium to >/= 3.6 and Magnesium to >/= 1.8  Code Status: Full Code    Chief Concern:   Volume Overload/Congestive Hepatopathy c/b SCD     Subjective:   HPI:  [name] [name] is a 49 y.o. male with PMHx of SCD, liver disease, CNS disease with cognitive problems and seizures, PulmHTN that presented to [facility] with 3 days of worsening abdominal distention in addition to worsening BLE edema and DOE. For the past two months he has noticed increasing leg swelling in both lower extremities and struggles to walk without getting short of breath, and in the last 3 days he has noticed increased fluid buildup in his abdomen. He does not have abdominal pain. He maintains an appetite and denies diarrhea. His last bowel movement was 2 days ago (9/7) but did not notice any blood in the stool. He reports an episode of bloody vomit 2 days ago (9/7). He denies dysuria but says his urine looks dark. He says he gets winded after walking a few steps, which is a drastic decline since June when he could walk around without SOB. He also has had generalized weakness and myalgias. At baseline his records indicate that he has some level of cognitive slowing and seizures related to his SCD.  He currently gets EPO injections twice a week but has not had his in over a week. He denies headaches or palpitations or chest pain. He also has not taken his lactulose or diuretics.    On arrival to [facility] ED, VSS aside from 88% SpO2, improved to 100% SpO2 on 2L [place]. Hb of 5.3 and Tbili 29.1.    Designated Environmental health practitioner:  [name] currently has decisional capacity for healthcare decision-making and is able to designate a surrogate healthcare decision maker. Mr. Stacy Gardner designated healthcare decision maker(s) is/are Edmonia Lynch (the patient's parent) as denoted by stated patient preference.    Allergies:  Patient has no known allergies.    Medications:   Prior to Admission medications    Medication Dose, Route, Frequency   carBAMazepine (TEGRETOL  XR) 400 MG 12 hr tablet Take 1 tablet in the morning and 2 tablets in the evening   cholecalciferol, vitamin D3, (VITAMIN D3) 10 mcg (400 unit) cap 400 Units, Oral, Daily   diclofenac sodium (VOLTAREN)  1 % gel 2 g, Topical, Daily PRN   empty container (SHARPS CONTAINER) Misc use as directed   epoetin alfa (PROCRIT) 40,000 unit/mL injection 40,000 Units, Subcutaneous, 2 times a week   folic acid (FOLVITE) 1 MG tablet TAKE 1 TABLET BY MOUTH EVERY DAY   furosemide (LASIX) 20 MG tablet TAKE 2 TABLETS BY MOUTH EVERY DAY   lactulose (CONSTULOSE) 10 gram/15 mL solution 30 g, Oral, 3 times a day (standard)   lidocaine (LIDODERM) 5 % patch 1 patch  Patient not taking: Reported on 04/07/2020   oxyCODONE (ROXICODONE) 5 MG immediate release tablet 5 mg, Oral, Every 6 hours PRN   OXYGEN-AIR DELIVERY SYSTEMS MISC 2 L/min, Each Nare, Nightly, [employer]   phenytoin (DILANTIN) 100 MG ER capsule Take 1 capsule in the morning and 2 capsules in the evening  Patient taking differently: 100 mg Two (2) times a day. Take 1 capsule in the morning and 1 capsules in the evening   sodium polystyrene sulfonate (SODIUM POLYSTYRENE) 15 gram/60 mL Susp 15 g, Oral, Daily (standard)   spironolactone (ALDACTONE) 25 MG tablet 25 mg, Oral, Daily (standard)  Patient not taking: Reported on 04/17/2020 syringe with needle (BD TUBERCULIN SYRINGE) 1 mL 27 x 1/2 Syrg Use as directed twice weekly       Medical History:  Past Medical History:   Diagnosis Date   ??? Anemia    ??? Foot pain    ??? 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 Hypertension    ??? Pulmonary hypertension (CMS-HCC)    ??? Seizures (CMS-HCC)    ??? Sickle cell anemia (CMS-HCC)    ??? Sickle cell trait (CMS-HCC)    ??? Vitamin D deficiency        Surgical History:  Past Surgical History:   Procedure Laterality Date   ??? PR ENDOSCOPIC US EXAM, ESOPH N/A 01/11/2020    Procedure: UGI ENDOSCOPY; WITH ENDOSCOPIC ULTRASOUND EXAMINATION LIMITED TO THE ESOPHAGUS;  Surgeon: Jules Husbands, MD;  [facility];  Service: Gastroenterology   ??? PR RIGHT HEART CATH O2 SATURATION & CARDIAC OUTPUT N/A 07/18/2017    Procedure: Right Heart Catheterization;  Surgeon: Zannie Cove, MD;  Location: [facility] CATH;  Service: Cardiology   ??? PR RIGHT HEART CATH O2 SATURATION & CARDIAC OUTPUT N/A 04/07/2020    Procedure: Right Heart Catheterization;  Surgeon: Zannie Cove, MD;  Location: [facility] CATH;  Service: Cardiology       Family History:   Family History   Problem Relation Age of Onset   ??? Hypertension Mother    ??? Hypertension Father    ??? No Known Problems Sister    ??? No Known Problems Brother    ??? No Known Problems Brother    ??? No Known Problems Brother    ??? No Known Problems Brother    ??? Sickle cell anemia Sister    ??? No Known Problems Sister    ??? Clotting disorder Paternal Grandmother    ??? No Known Problems Maternal Aunt    ??? No Known Problems Maternal Uncle    ??? No Known Problems Paternal Aunt    ??? No Known Problems Paternal Uncle    ??? No Known Problems Maternal Grandmother    ??? No Known Problems Maternal Grandfather    ??? No Known Problems Paternal Grandfather    ??? No Known Problems Other    ??? Seizures Neg Hx    ??? Pulmonary Hypertension Neg Hx    ???  Autoimmune disease Neg Hx    ??? Venous thrombosis Neg Hx    ??? Amblyopia Neg Hx    ??? Blindness Neg Hx    ??? Cancer Neg Hx    ??? Cataracts Neg Hx    ??? Diabetes Neg Hx    ??? Glaucoma Neg Hx    ??? Macular degeneration Neg Hx    ??? Retinal detachment Neg Hx    ??? Strabismus Neg Hx    ??? Stroke Neg Hx    ??? Thyroid disease Neg Hx    ??? Anesthesia problems Neg Hx    ??? Broken bones Neg Hx    ??? Collagen disease Neg Hx    ??? Dislocations Neg Hx    ??? Fibromyalgia Neg Hx    ??? Gout Neg Hx    ??? Hemophilia Neg Hx    ??? Osteoporosis Neg Hx    ??? Rheumatologic disease Neg Hx    ??? Scoliosis Neg Hx    ??? Severe sprains Neg Hx    ??? Spinal Compression Fracture Neg Hx        Social History:  The patient lives alone but in the past few months has been living with his parents due to worsening health in the last few months.     Social History     Tobacco Use   ??? Smoking status: Current Every Day Smoker     Years: 17.00     Types: Cigars   ??? Smokeless tobacco: Never Used   ??? Tobacco comment: 2 cigar daily   Vaping Use   ??? Vaping Use: Never used   Substance Use Topics   ??? Alcohol use: Not Currently     Alcohol/week: 0.0 standard drinks     Comment: occasional   ??? Drug use: No        Review of Systems:  10 systems were reviewed and are negative unless otherwise mentioned in the HPI    Objective:   Physical Exam:  Temp:  [35.1 ??C-36.4 ??C] 35.1 ??C  Heart Rate:  [52-63] 56  SpO2 Pulse:  [51-64] 53  Resp:  [12-22] 18  BP: (110-133)/(57-80) 125/58  SpO2:  [88 %-100 %] 100 %    Gen: ill appearing male AA male in NAD, answers questions slowly   Eyes: Sclera quite icteric, EOMI, PERRLA,  HENT: atraumatic, normocephalic, MMM. Dry lips. OP w/o erythema or exudate   Neck: no cervical lymphadenopathy or thyromegaly, no JVD  Heart: RRR, S1, S2, no M/R/G, no chest wall tenderness  Lungs: CTAB, no crackles or wheezes, no use of accessory muscles  Abdomen: Normoactive bowel sounds, soft, distended, NT, no rebound/guarding, no hepatosplenomegaly  Extremities: b/l 3+ LE edema to the knee, 2+ to the hip. No clubbing, cyanosis, or edema: pulses are +2 in bilateral upper and lower extremities. Asterixis b/l.   Neuro: CN II-XI grossly intact, normal cerebellar function, No focal deficits.  Skin:  No rashes, lesions on clothed exam   Psych: Groggy , oriented x4, slowed responses     Labs/Studies/Imaging:  Labs, Studies, Imaging from the last 24hrs per EMR and personally reviewed    -Abigail Miyamoto, MS3  Service: Med U

## 2020-05-02 NOTE — Unmapped (Signed)
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 y/o male that presents to [facility] with Volume overload.    Patient seen per request of the primary team for the following POCUS exam: Focused abdominal ultrasound     The working diagnosis at time of POCUS consult was Ascites.    POCUS exam performed: Same    Findings were communicated directly to the primary team and the patient and include:     There was evidence of ascites on POCUS exam however largest pocket was in close proximity to loops of bowel. Largest fluid pocket that was not in vicinity of bowel loops was 2-3cm. Of note, there was no notable spleen which correlates with patients history of sickle cell disease.                               As a result, the primary team reported that the primary working diagnosis changed: No.      As a result, the primary team reported that the next diagnostic or management step changed: Yes.  If so, how? Due to poorly accessible fluid pockets, primary team believed risk of paracentesis outweighed the benefits and opted not to move forward to procedure.    Thank you for this consultation.      **This service is a part of a Medical laboratory scientific officer and project to explore the impact of POCUS. Formal/consultative imaging should be obtained to confirm POCUS findings or clinical suspicion in the absence of POCUS findings.Cathlyn Parsons, MD PGY-2  Pager [phone]  [facility] Internal Medicine

## 2020-05-02 NOTE — Unmapped (Signed)
Hematology Consult Note    Requesting Attending Physician :  Vonda Antigua, MD  Service Requesting Consult : [facility] ([facility])  Reason for Consult: co-management of HgB SS disease   Primary Hematologist: Dr Nigel Berthold    Assessment: [name] [name] is 49 y.o. with HgB SS disease, decompensated congestive hepatopathy, pulmonary HTN, ascites who was admitted for worsening ascites and lower extremity edema. Hematology was consulted to help determine the if his sickle cell is contributing to his presentation.     1. Macrocytic anemia (MCV 121.9, HgB 5.2): noted since 2016, MCV (156-327) and HgB 5.2-7.7. Risk factors for macrocytosis are A) nutritional deficiencies- folate in 01/12/20 was >20 but there is no Vit B12 on file B) drug exposure, he is taking phenytoin which is known to cause macrocytosis. C) EToH but he denies consuming EToH D) and hypothyroidism however his thyroid levels have been normal as recently as 03/21/2019 and he has not reported symptoms of hypothyroidism. Will recommend checking Vit B12, supplement if needed, but recommend monitoring.     2. Thrombocytopenia: Plt downtrending since 09/26/2019 from normal to 81 today and was noted intermittently in 2020. Platelet clumping has been reported on 7/23. The MPV is only mildly elevated at 10.6. 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 B) Decreased TPO production given his liver compromise. C) infection- this could be possible given his abdominal pain that he has so he could have SBP- although the pain could also be related to his congested hepatopathy. He has no other evidence of infection and is not on antibiotics. D) DIC given the prolonged PT, we recommend checking aPTT and fibrinogen to also give insight into liver function. Recommend monitoring his platelet count for now. It may decline as his liver function worsens, but provided it remains >50,000 this is sufficient for hemostasis at this time.       3. Prolonged PT- persistently elevated PT since 2016 but it is the highest it has ever been at 24.5. A) Nutritional deficiency- he reports an 'ok diet' which could be deficient of Vitamin K. B) Worsening synthetic liver function which has impaired the ability to make Vit K dependent factors such as factor VII leading to prolonged PT. Recommend obtaining a PTT and starting supplemental Vit K.     4. HgB SS disease- baseline HgB 5.5-6.5 g/dl (HgB at baseline today 5.2 g/dl) and get EPO 16X units twice weekly and was on Voxeletor 500mg  as recently as June 2021. He has multiorgan disease involvement and most notable for congestive hepatopathy which has been worsening and required multiple hospitalizations in May and June of this year and on biopsy had overall appearance of the liver with congestion, sinusoidal fibrosis, and hepatocyte atrophy are suggestive of outflow obstruction and are likely related to/exacerbated by the patient's history of sickle cell anemia. As a consequence of his disease he is also difficult to transfuse as he has multiple allo-antibodies. This has acutely worsening tbili 14.7 -> 29.1. His HgB is at baseline, the LDH is mildly elevated to 1082 (on 7/23) but none today which is unlikely that this is related to his elevated tbili which has been increasing, however, this presentation is not consistent with an acute pain episode causing hyperhemolysis and driving this current decompensation. It is important however to increase resume his voxelotor as this can help the rbcs retain their shape  and increase his EPO to increase RBC production to optimize his SCD.      Recommendations:   1. Continue folic acid and resume voxelotor 500mg  daily  - please obtain home supply as it is not on formulary at our pharmacy  2. Please obtain PTT and fibrinogen and start supplementation with 10mg  po Vit K for 3 days  3. Recommend cbc, reticulocyte, LDH, CMP, and dbili every other day  4. Please obtain Vit B12   5. Collect all blood samples in pediatric tubes to minimize blood loss.   6. Start EPO 15000 units daily until discharge, then resume to 40, 000 twice weekly  7. Please do not transfuse blood without discussing this with the Hematology team as he has multiple allo-antibodies and is at risk for hyper-hemolysis.   8. Continue nocturnal oxygen   9. Discussed with Transfusion Medicine to obtain phenotypically matched blood for a simple transfusion in the event of an emergency  - Discussed with Transfusion Medicine and it is thought that this would be too risky   10. Appreciate the assistance of GI in the management of his liver disease   11. Continue enoxaparin 40mg  prophylaxis daily   12. Encourage ambulation and ICS q51mins while awake.    This patient has been seen and discussed with Dr. Janie Morning. These recommendations were discussed with the primary team.     Please contact the hematology fellow at [phone] with any further questions.    [name], MD  Hematology-Oncology Fellow    -------------------------------------------------------------    HPI: [name] [name] is a 49 y.o. man with HgB SS disease, liver disease, epilepsy, and pulmonary HTN who was admitted in 9/9 with worsening abdominal distention, bilateral LE edema, and DOE. He is being seen at the request of Vonda Antigua, MD for co-management of his HgB SS disease.     [name] reports that started 3-4 weeks ago he then started developing leg swelling but without pain. Gradually his face also started to swell and it became harder for him to breathe prompting im to come to the ED for evaluation. He reports that sometimes his abdomen hurts in the right upper quadrant and has noticed that his urine looks more tea colored.     Review of Systems: Review of Systems - a 12 point ROS is negative unless stated in the HPI    Past Medical History:   Diagnosis Date   ??? Anemia    ??? Foot pain    ??? 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 Hypertension    ??? Pulmonary hypertension (CMS-HCC)    ??? Seizures (CMS-HCC)    ??? Sickle cell anemia (CMS-HCC)    ??? Sickle cell trait (CMS-HCC)    ??? Vitamin D deficiency        Past Surgical History:   Procedure Laterality Date   ??? PR ENDOSCOPIC US EXAM, ESOPH N/A 01/11/2020    Procedure: UGI ENDOSCOPY; WITH ENDOSCOPIC ULTRASOUND EXAMINATION LIMITED TO THE ESOPHAGUS;  Surgeon: Jules Husbands, MD;  [facility];  Service: Gastroenterology   ??? PR RIGHT HEART CATH O2 SATURATION & CARDIAC OUTPUT N/A 07/18/2017    Procedure: Right Heart Catheterization;  Surgeon: Zannie Cove, MD;  Location: [facility] CATH;  Service: Cardiology   ??? PR RIGHT HEART CATH O2 SATURATION & CARDIAC OUTPUT N/A 04/07/2020    Procedure: Right Heart Catheterization;  Surgeon: Zannie Cove, MD;  Location: [facility] CATH;  Service: Cardiology  Family History   Problem Relation Age of Onset   ??? Hypertension Mother    ??? Hypertension Father    ??? No Known Problems Sister    ??? No Known Problems Brother    ??? No Known Problems Brother    ??? No Known Problems Brother    ??? No Known Problems Brother    ??? Sickle cell anemia Sister    ??? No Known Problems Sister    ??? Clotting disorder Paternal Grandmother    ??? No Known Problems Maternal Aunt    ??? No Known Problems Maternal Uncle    ??? No Known Problems Paternal Aunt    ??? No Known Problems Paternal Uncle    ??? No Known Problems Maternal Grandmother    ??? No Known Problems Maternal Grandfather    ??? No Known Problems Paternal Grandfather    ??? No Known Problems Other    ??? Seizures Neg Hx    ??? Pulmonary Hypertension Neg Hx    ??? Autoimmune disease Neg Hx    ??? Venous thrombosis Neg Hx    ??? Amblyopia Neg Hx    ??? Blindness Neg Hx    ??? Cancer Neg Hx    ??? Cataracts Neg Hx    ??? Diabetes Neg Hx    ??? Glaucoma Neg Hx    ??? Macular degeneration Neg Hx    ??? Retinal detachment Neg Hx    ??? Strabismus Neg Hx    ??? Stroke Neg Hx    ??? Thyroid disease Neg Hx    ??? Anesthesia problems Neg Hx    ??? Broken bones Neg Hx    ??? Collagen disease Neg Hx    ??? Dislocations Neg Hx    ??? Fibromyalgia Neg Hx    ??? Gout Neg Hx    ??? Hemophilia Neg Hx    ??? Osteoporosis Neg Hx    ??? Rheumatologic disease Neg Hx    ??? Scoliosis Neg Hx    ??? Severe sprains Neg Hx    ??? Spinal Compression Fracture Neg Hx        Social History     Socioeconomic History   ??? Marital status: Single     Spouse name: Not on file   ??? Number of children: 0   ??? Years of education: Not on file   ??? Highest education level: Not on file   Occupational History   ??? Occupation: disability   Tobacco Use   ??? Smoking status: Current Every Day Smoker     Years: 17.00     Types: Cigars   ??? Smokeless tobacco: Never Used   ??? Tobacco comment: 2 cigar daily   Vaping Use   ??? Vaping Use: Never used   Substance and Sexual Activity   ??? Alcohol use: Not Currently     Alcohol/week: 0.0 standard drinks     Comment: occasional   ??? Drug use: No   ??? Sexual activity: Not on file   Other Topics Concern   ??? Not on file   Social History Narrative   ??? Not on file     Social Determinants of Health     Financial Resource Strain: Low Risk    ??? Difficulty of Paying Living Expenses: Not hard at all   Food Insecurity: No Food Insecurity   ??? Worried About Running Out of Food in the Last Year: Never true   ??? Ran Out of Food in the Last Year: Never true  Transportation Needs: No Transportation Needs   ??? Lack of Transportation (Medical): No   ??? Lack of Transportation (Non-Medical): No   Physical Activity:    ??? Days of Exercise per Week:    ??? Minutes of Exercise per Session:    Stress:    ??? Feeling of Stress :    Social Connections:    ??? Frequency of Communication with Friends and Family:    ??? Frequency of Social Gatherings with Friends and Family:    ??? Attends Religious Services:    ??? Database administrator or Organizations:    ??? Attends Banker Meetings:    ??? Marital Status:        Allergies: has No Known Allergies.    Medications:   Meds:  ??? carBAMazepine  400 mg Oral Daily   ??? carBAMazepine  800 mg Oral Nightly   ??? cholecalciferol (vitamin D3-10 mcg (400 unit))  10 mcg Oral Daily   ??? enoxaparin (LOVENOX) injection  40 mg Subcutaneous Q24H [facility]   ??? [START ON 05/05/2020] epoetin alfa-EPBX  15,000 Units Subcutaneous Once per day on Mon Thu   ??? folic acid  1,000 mcg Oral Daily   ??? furosemide  40 mg Oral Daily   ??? lactulose  30 g Oral TID   ??? phenytoin  100 mg Oral BID   ??? phytonadione (vitamin K1)  10 mg Oral Daily   ??? polyethylene glycol  17 g Oral Daily   ??? spironolactone  25 mg Oral Daily     Continuous Infusions:  PRN Meds:.acetaminophen, aluminum-magnesium hydroxide-simethicone, diclofenac sodium, guaiFENesin, HYDROmorphone, melatonin, ondansetron **OR** ondansetron, senna, traMADoL    Objective:   Vitals: Temp:  [35.1 ??C (95.1 ??F)-35.3 ??C (95.5 ??F)] 35.3 ??C (95.5 ??F)  Heart Rate:  [54-72] 72  Resp:  [18-19] 19  BP: (124-131)/(57-67) 131/67  MAP (mmHg):  [92] 92  SpO2:  [95 %-100 %] 95 %    Physical Exam:  BP 131/67  - Pulse 72  - Temp 35.3 ??C (95.5 ??F) (Axillary)  - Resp 19  - SpO2 95%    General appearance - flat affect, eyes closed most of the time, but polite, slow to answer questions    Mental status - alert, oriented to person, place, and time   Eyes - pupils equal and reactive, extraocular eye movements intact, sclera icteric    Nose - normal and patent, no erythema, discharge or polyps   Mouth - mucous membranes moist, pharynx normal without lesions   Neck - supple   Lymphatics - no palpable lymphadenopathy   Pulmonary - CTAB, shallow respirations    Cardiovascular- RRR, no MRG   Gastrointestinal - distended, taunt abdomen, mildly tender to palpation over the R side   Neurological - alert, oriented, normal speech, no focal findings or movement disorder noted   Musculoskeletal - no joint tenderness, deformity or swelling   Extremities - pitting edema to the lower knees Skin - icteric, areas of hyperpigmentation noted on the lower legs      Test Results  Recent Labs     05-24-20  1150 05/02/20  0916   WBC 6.5 8.0   NEUTROABS 3.7  --    HGB 5.3* 5.2*   PLT 71* 81*       Imaging    US Liver Doppler 05/02/20  1. Patent hepatic vasculature with normal flow direction.  2. Chronic liver disease with findings of cirrhosis and small volume ascites.  3. Horseshoe kidney.  4. Poorly evaluated gallbladder with suggestion of wall thickening, likely secondary to underlying hepatic dysfunction.

## 2020-05-02 NOTE — Unmapped (Signed)
Alert and oriented x4. Weak. Sleepy. Breathing normal  and regular on supplemental oxygen. Declined to be medicated with pain reliever for 5 out of 10 pain in abdomen. Verbalizes needs. Call bell, phone kept in reach. Bed kept at lowest position. Breaks kept locked. Bed alarm utilized for safety. Medication, procedure, new treatment discussed with patient. Slept fairly well. Expressed that he does not want to be woken up. Refused to have blood drawn before midnight and MD updated via textpage. Vital signs monitored per protocol. Will continue to monitor.    Problem: Adult Inpatient Plan of Care  Goal: Plan of Care Review  05/02/2020 0540 by Lennox Grumbles, RN  Outcome: Progressing  05/02/2020 0539 by Lennox Grumbles, RN  Outcome: Progressing  Goal: Patient-Specific Goal (Individualized)  05/02/2020 0540 by Lennox Grumbles, RN  Outcome: Progressing  Flowsheets (Taken 05/02/2020 0540)  Patient-Specific Goals (Include Timeframe): Breathing pattern monitored thru end of shift.  05/02/2020 0539 by Lennox Grumbles, RN  Outcome: Progressing  Flowsheets (Taken 05/02/2020 [phone])  Individualized Care Needs: No falls thru end of shift.  Anxieties, Fears or Concerns: Pain will be tolerable thru end of shift.  Goal: Absence of Hospital-Acquired Illness or Injury  05/02/2020 0540 by Lennox Grumbles, RN  Outcome: Progressing  05/02/2020 0539 by Lennox Grumbles, RN  Outcome: Progressing  Intervention: Identify and Manage Fall Risk  Recent Flowsheet Documentation  Taken 05/02/2020 0200 by Lennox Grumbles, RN  Safety Interventions: bed alarm  Taken 05/02/2020 0000 by Lennox Grumbles, RN  Safety Interventions:   fall reduction program maintained   environmental modification   bed alarm  Taken 2020-05-02 2200 by Lennox Grumbles, RN  Safety Interventions: bed alarm  Goal: Optimal Comfort and Wellbeing  05/02/2020 0540 by Lennox Grumbles, RN  Outcome: Progressing  05/02/2020 0539 by Lennox Grumbles, RN  Outcome: Progressing  Goal: Readiness for Transition of Care  05/02/2020 0540 by Lennox Grumbles, RN  Outcome: Progressing  05/02/2020 0539 by Lennox Grumbles, RN  Outcome: Progressing  Goal: Rounds/Family Conference  05/02/2020 0540 by Lennox Grumbles, RN  Outcome: Progressing  05/02/2020 0539 by Lennox Grumbles, RN  Outcome: Progressing

## 2020-05-02 NOTE — Unmapped (Signed)
Pt is A&Ox4, consult with liver team today, agreed to lab draw this morning, expressed feelings of annoyance with care throughout day, continuing with plan of care as it develops.    Problem: Adult Inpatient Plan of Care  Goal: Plan of Care Review  Outcome: Ongoing - Unchanged  Flowsheets (Taken 05/02/2020 1612)  Progress: improving  Plan of Care Reviewed With: patient  Goal: Patient-Specific Goal (Individualized)  Outcome: Ongoing - Unchanged  Flowsheets (Taken 05/02/2020 1612)  Patient-Specific Goals (Include Timeframe): Pt will be free from fall or injury throughout shift 0700-1900  Individualized Care Needs: Lactulose, fall precautions  Anxieties, Fears or Concerns: Wishes to sleep and not be bothered  Goal: Absence of Hospital-Acquired Illness or Injury  Outcome: Ongoing - Unchanged  Goal: Optimal Comfort and Wellbeing  Outcome: Ongoing - Unchanged  Goal: Readiness for Transition of Care  Outcome: Ongoing - Unchanged  Goal: Rounds/Family Conference  Outcome: Ongoing - Unchanged     Problem: Self-Care Deficit  Goal: Improved Ability to Complete Activities of Daily Living  Outcome: Ongoing - Unchanged  Intervention: Promote Activity and Functional Independence  Recent Flowsheet Documentation  Taken 05/02/2020 1612 by Camila Li, RN  Self-Care Promotion:   BADL personal objects within reach   BADL personal routines maintained     Problem: Fall Injury Risk  Goal: Absence of Fall and Fall-Related Injury  Outcome: Ongoing - Unchanged  Intervention: Identify and Manage Contributors  Flowsheets (Taken 05/02/2020 1612)  Self-Care Promotion:   BADL personal objects within reach   BADL personal routines maintained     Problem: Impaired Wound Healing  Goal: Optimal Wound Healing  Outcome: Ongoing - Unchanged

## 2020-05-02 NOTE — Unmapped (Signed)
Internal Medicine (MED[facility]) Progress Note    Assessment & Plan:   [name] [name] is a 49 y.o. male with sickle cell anemia c/b congestive hepatopathy, CNS disease (cognitive slowing and prior seizures), pHTN that presented to [facility] with acute on chronic worsening volume overload (belly distended and LE edema) as well as worsening DOE and weakness.      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 *        Volume Overload I Cholestatic Hepatopathy  Hepatology is following and they suspect his chronic liver disease is a combination of congestive hepatopathy due to pHTN (biopsy confirms this) and sickle cell hepatopathy. His worsening ascites and LE edema is likely due to worsening liver disease. Per their rec's:  - agree with liver US with doppler  - diagnostic para, please send total protein, albumin, cell count/diff, culture  - can also perform therapeutic para for comfort, if more than 4L removed give 8g 25% albumin for every 1L removed  - 2gm sodium diet  - consult nutrition  - continue lasix 40mg  daily and spironolactone 25mg  daily  - IV Vit K 10mg  IV x 3 days  - trend INR (per heme rec's below, will do every other day)  - continue home lactulose, titrate to 2-5 BM daily  - outpatient EGD for EV screening  ??  Sickle Cell Anemia, Hgb SS   Being followed by hematology. Do not transfuse without speaking to hematology, as he has multiple allo-antibodies. Per their rec's:  - continue folic acid and home voxelotor 500mg  daily (patient will need home supply - not carried here)  - check PTT, fibrinogen  - supplement 10mg  PO Vitamin K x3 days  - every other day CBC, reticulocytes, LDH, CMP, D Bili, Haptoglobin - collect in pediatric tubes to minimize blood loss  - EPO 15,000 units daily until discharge then resume 40,000 daily  - do not transfuse blood without discussing with hematology team  - heme is reaching out to blood bank for phenotypically matched blood for simple transfusion  - appreciate GI assistance with management of liver disease  - enoxaparin ppx 40mg  daily ok    Oliguric AKI  Patient has an elevated Cr (1.3 from baseline 0.8) in addition to his liver dysfunction, volume overload. Has documented proteinuria previously but typically normal GFR. Diuretics held on admission but will restart per GI rec's as above.  - U/A, bladder scan, renal U/S  - strict I/O   ??  Generalized Weakness I Failure to Thrive  - PT/OT   ??    Chronic Problems:  Hx of seizures and cognitive slowing 2/2 SCD   - continue home phenytoin and carbamazepine   - check dilantin, carbamezapine level       Daily Checklist:  Diet: Regular Diet  DVT PPx: Lovenox 40mg  q24h  Electrolytes: Replete Potassium to >/=4 and Magnesium to >/=2  Code Status: Full Code  Dispo: continue floor care    Team Contact Information:   Primary Team: Internal Medicine (MEDU)  Primary Resident: Ivonne Andrew, MD  Resident's Pager: [phone] (Gen Med[facility] Intern - Cliffton Asters)    Interval History:  No acute events overnight. Patient states he has had blurry vision for about 2 months and he thinks this is because he isn't sleeping well due to his abdominal discomfort. The abdominal discomfort has worsened over the last month and is making him feel like he can't eat and he is nauseated and vomiting. He isn't having abdominal or chest pain, just discomfort. He feels like he is getting short of breath when he just tries to walk down the hall. He used to be able to do much more activity without getting short of breath. He thinks he would feel better and sleep better if the fluid in his belly were gone.    No headaches    Objective:   Temp:  [35.1 ??C-35.3 ??C] 35.3 ??C  Heart Rate: [54-72] 72  SpO2 Pulse:  [53] 53  Resp:  [12-19] 19  BP: (124-131)/(57-67) 131/67  SpO2:  [95 %-100 %] 95 %    Gen: chronically ill appearing M in NAD, answers questions appropriately  Eyes: scleral icterus present, reports examiner holding up 3 fingers correctly, identifies 1 finger as 2 fingers. Multiple beats of R & L horizontal nystagmus present. No vertical nystagmus. Pupils are equal and round.    HENT: atraumatic, dry cracked lips  Heart: RRR, S1, S2, blowing systolic murmur heard at R&L sternal borders. High pitched systolic murmur present at apex and radiates to axilla   Lungs: CTAB on anterior exam, no crackles or wheezes, no use of accessory muscles  Abdomen: Normoactive bowel sounds, moderately firm, not tender to mild palpation, no rebound/guarding  Extremities: 2+ pitting edema in the BLEs  Psych: Alert, oriented x4 (stated year was 2001 but then stated month was September), appropriate mood and affect    Labs/Studies: Labs and Studies from the last 24hrs per EMR and Reviewed      Alwyn Pea, MD   Neurology, PGY-1

## 2020-05-03 LAB — COMPREHENSIVE METABOLIC PANEL
ALBUMIN: 1.1 g/dL — ABNORMAL LOW (ref 3.4–5.0)
ALKALINE PHOSPHATASE: 243 U/L — ABNORMAL HIGH (ref 46–116)
ALT (SGPT): 20 U/L (ref 10–49)
AST (SGOT): 78 U/L — ABNORMAL HIGH (ref <=34)
BILIRUBIN TOTAL: 31.3 mg/dL (ref 0.3–1.2)
CALCIUM: 9.1 mg/dL (ref 8.7–10.4)
CHLORIDE: 105 mmol/L (ref 98–107)
EGFR CKD-EPI NON-AA MALE: >90 mL/min/{1.73_m2} (ref >=60)
POTASSIUM: 4.9 mmol/L — ABNORMAL HIGH (ref 3.4–4.5)
PROTEIN TOTAL: 4.1 g/dL — ABNORMAL LOW (ref 5.7–8.2)
SODIUM: 133 mmol/L — ABNORMAL LOW (ref 135–145)

## 2020-05-03 LAB — ALBUMIN: Albumin:MCnc:Pt:Ser/Plas:Qn:: 1.1 — ABNORMAL LOW

## 2020-05-03 LAB — PHENYTOIN FREE: Phenytoin.free:MCnc:Pt:Ser/Plas:Qn:: 2.67

## 2020-05-03 MED ADMIN — enoxaparin (LOVENOX) syringe 40 mg: 40 mg | SUBCUTANEOUS | @ 14:00:00

## 2020-05-03 MED ADMIN — furosemide (LASIX) tablet 40 mg: 40 mg | ORAL | @ 14:00:00

## 2020-05-03 MED ADMIN — phenytoin (DILANTIN) ER capsule 100 mg: 100 mg | ORAL | @ 14:00:00 | Stop: 2020-05-03

## 2020-05-03 MED ADMIN — spironolactone (ALDACTONE) tablet 25 mg: 25 mg | ORAL | @ 14:00:00 | Stop: 2020-05-03

## 2020-05-03 MED ADMIN — carBAMazepine (TEGretol  XR) 12 hr tablet 800 mg: 800 mg | ORAL | @ 01:00:00

## 2020-05-03 MED ADMIN — lactulose (CHRONULAC) oral solution (30 mL cup): 30 g | ORAL | @ 14:00:00

## 2020-05-03 NOTE — Unmapped (Signed)
Pt has NO IV meds ordered,Primary team has documented pt refused PIV placement.  Spoke to care RN,Care RN will place another PIV order when Pt agrees and has IV meds ordered.      Workup / Procedure Time:  15 minutes    RN was notified.       Thank you,     Lorelle Formosa RN Venous Access Team

## 2020-05-03 NOTE — Unmapped (Signed)
OCCUPATIONAL THERAPY  Evaluation (05/03/20 0959)    Patient Name:  [name] [name]       Medical Record Number: [id number]   Date of Birth: 01/22/1971  Sex: Male          OT Treatment Diagnosis:  ADL, functional mobility, and functional cognition deficits    [name] [name] is a 49 y.o. 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     Assessment  Problem List: Decreased strength, Impaired balance, Decreased mobility, Decreased coordination, Decreased cognition, Decreased safety awareness, Orthopedic restrictions, Fall Risk, Impaired ADLs, Imparied fine motor skills  Clinical Decision Making: Moderate  Assessment: Pt presents to acute OT demonstrating impaired ADL performance and functional mobility 2/2 BLE weakness, impaired balance, and cognitive deficits. No family present to confirm PLOF and home setup/support. Currently recommend post-acute OT 5x/week (low intensity).   Today's Interventions: OT role, POC, sup<>sit, sit<>stand, functional mobility in room, donning B socks (TOTAL A at EOB), meal setup (extra to open syrup, therapist opened orange juice, pt able to self-feed with fork in L hand), bed mobility, positioning, bed in chair position.    Activity Tolerance During Today's Session  Tolerated treatment well, Limited by cognition    Plan  Planned Frequency of Treatment:  1-2x per day for: 3-4x week       Planned Interventions:  Adaptive equipment, ADL retraining, Balance activities, Bed mobility, Compensatory tech. training, Conservation, Education - Patient, Education - Family / caregiver, Endurance activities, Functional cognition, Functional mobility, Home exercise program, Modalities, Neuromuscular re-education, Passive range of motion, Postular / Proximal stability, Positioning, Range of motion, Safety education, Therapeutic exercise, Transfer training, UE Strength / coordination exercise, Visual / perceptual tasks, Wheelchair training    Post-Discharge Occupational Therapy Recommendations:  5x weekly, Low intensity   OT DME Recommendations: Defer to post acute    GOALS:   Patient and Family Goals: To be able to go back to his home and care for himself    Long Term Goal #1: Pt will maximize his functional independence within 8 weeks       Short Term:  Pt will complete toilet t/f and toileting with SBA + LRAD   Time Frame : 2 weeks  Pt will complete LBD with SBA + LRAD   Time Frame : 2 weeks  Pt will complete 5 mins standing ADL with SBA + LRAD and no LOB   Time Frame : 2 weeks  Pt will complete 15 mins EOB ADL with supervision and no LOB   Time Frame : 2 weeks           Prognosis:  Fair  Positive Indicators:  Social support  Barriers to Discharge: Decreased caregiver support, Endurance deficits, Impaired Balance, Gait instability, Decreased safety awareness, Functional strength deficits, Impulsivity    Subjective  Current Status Pt rec'd up in bed, left with bed in chair position, call bell in reach, bed alarm set to moving toward EOB, RN aware  Prior Functional Status Difficult to get through PLOF from pt 2/2 cognition. He reports he has been staying with his parents 2/2 being less independent with ADL recently. He has been mainly doing washups at the sink. Unclear whether he has been able to dress his lower body. Uses a RW most of the time when I'm not  doing well.    Medical Tests / Procedures: 03/14/2020: Inferiorly displaced and angulated extra-articular right distal clavicle fracture.  Services patient receives:  (per Epic pt was referred to [facility] PT on 9/7 but agency declined)    Patient / Caregiver reports: I can't do it right now. (re: donning socks himself 2/2 edema)    Past Medical History:   Diagnosis Date   ??? Anemia    ??? Foot pain    ??? 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 Hypertension    ??? Pulmonary hypertension (CMS-HCC)    ??? Seizures (CMS-HCC)    ??? Sickle cell anemia (CMS-HCC)    ??? Sickle cell trait (CMS-HCC)    ??? Vitamin D deficiency     Social History     Tobacco Use   ??? Smoking status: Current Every Day Smoker     Years: 17.00     Types: Cigars   ??? Smokeless tobacco: Never Used   ??? Tobacco comment: 2 cigar daily   Substance Use Topics   ??? Alcohol use: Not Currently     Alcohol/week: 0.0 standard drinks     Comment: occasional      Past Surgical History:   Procedure Laterality Date   ??? PR ENDOSCOPIC US EXAM, ESOPH N/A 01/11/2020    Procedure: UGI ENDOSCOPY; WITH ENDOSCOPIC ULTRASOUND EXAMINATION LIMITED TO THE ESOPHAGUS;  Surgeon: Jules Husbands, MD;  [facility];  Service: Gastroenterology   ??? PR RIGHT HEART CATH O2 SATURATION & CARDIAC OUTPUT N/A 07/18/2017    Procedure: Right Heart Catheterization;  Surgeon: Zannie Cove, MD;  Location: [facility] CATH;  Service: Cardiology   ??? PR RIGHT HEART CATH O2 SATURATION & CARDIAC OUTPUT N/A 04/07/2020    Procedure: Right Heart Catheterization;  Surgeon: Zannie Cove, MD;  Location: [facility] CATH;  Service: Cardiology    Family History   Problem Relation Age of Onset   ??? Hypertension Mother    ??? Hypertension Father    ??? No Known Problems Sister    ??? No Known Problems Brother    ??? No Known Problems Brother    ??? No Known Problems Brother    ??? No Known Problems Brother    ??? Sickle cell anemia Sister    ??? No Known Problems Sister    ??? Clotting disorder Paternal Grandmother    ??? No Known Problems Maternal Aunt    ??? No Known Problems Maternal Uncle    ??? No Known Problems Paternal Aunt    ??? No Known Problems Paternal Uncle    ??? No Known Problems Maternal Grandmother    ??? No Known Problems Maternal Grandfather    ??? No Known Problems Paternal Grandfather    ??? No Known Problems Other    ??? Seizures Neg Hx    ??? Pulmonary Hypertension Neg Hx    ??? Autoimmune disease Neg Hx    ??? Venous thrombosis Neg Hx    ??? Amblyopia Neg Hx    ??? Blindness Neg Hx    ??? Cancer Neg Hx    ??? Cataracts Neg Hx    ??? Diabetes Neg Hx    ??? Glaucoma Neg Hx    ??? Macular degeneration Neg Hx    ??? Retinal detachment Neg Hx    ??? Strabismus Neg Hx    ??? Stroke Neg Hx    ??? Thyroid disease Neg Hx    ??? Anesthesia problems Neg Hx    ???  Broken bones Neg Hx    ??? Collagen disease Neg Hx    ??? Dislocations Neg Hx    ??? Fibromyalgia Neg Hx    ??? Gout Neg Hx    ??? Hemophilia Neg Hx    ??? Osteoporosis Neg Hx    ??? Rheumatologic disease Neg Hx    ??? Scoliosis Neg Hx    ??? Severe sprains Neg Hx    ??? Spinal Compression Fracture Neg Hx         Ace inhibitors     Objective Findings  Precautions / Restrictions  Falls precautions    Weight Bearing  RUE NWB - Non weight bearing (Per ortho note 7/23. Pt reports has been putting some weight through his RUE lately. Messaged primary team to clarify this date.)    Required Braces or Orthoses  Non-applicable    Communication Preference  Verbal, Visual    Pain  Pt denied pain    Equipment / Environment  Vascular access (PIV, TLC, Port-a-cath, PICC), Patient not wearing mask for full session    Living Situation  Living Environment: House (plans to d/c to parent's house where he has been staying)  Lives With: Father, Mother  Home Living: One level home, Ramped entrance, Tub/shower unit, Standard height toilet  Equipment available at home: [employer]     Cognition   Orientation Level:   (not formally assessed)   Arousal/Alertness:  Appropriate responses to stimuli, Delayed responses to stimuli (slow responses)   Attention Span:  Attends with cues to redirect   Memory:  Unable to assess   Following Commands:  Follows one step commands with increased time   Safety Judgment:  Decreased awareness of need for assistance, Decreased awareness of need for safety   Awareness of Errors:  Decreased awareness of need for assistance, Decreased awareness of need for safety, Assistance required to identify errors made, Assistance required to correct errors made, Decreased awareness of errors   Problem Solving:  Assistance required to identify errors made, Assistance required to generate solutions, Assistance required to implement solutions   Comments: Per Epic, pt has CNS disease with cognitive slowing and prior seizures    Vision / Perception    Hearing: Appears intact   Vision: Does not wear glasses  Perception: Postural sway and LOBs sitting EOB.  Comments: Reports he wakes up with blurry vision when he doesn't get good sleep. Denied visual changes during session however occasionally squinted or closed one eye when speaking to therapist.    Hand Function  Hand Dominance: R  5/5 B grip, decreased coordination for opening small packets however was successful today with increased time. Self-fed with fork in L hand.    Skin Inspection  Jaundiced. Moderate BLE edema and ascites.    ROM / Strength/Coordination  UE ROM/ Strength/ Coordination: AROM WFL bilaterally, LUE strength WFL. Pt reports putting small amounts of weight through RUE recently (RUE was NWB 7/23 for distal clavicle fx and pt has not returned per f/u instructions).  LE ROM/ Strength/ Coordination: BLE weak and limited by edema. Able to cross legs for LBD but inable to access feet.     Sensation:  Denied n/t    Balance:  Varying CGA-MIN A sitting EOB 2/2 significant postural sway and MOD A for 1 heavy lateral LOB when attempting dual attention. CGA + RW for standing.    Functional Mobility  Transfer Assistance Needed: Yes  Transfers - Needs Assistance:  (MIN A sup<>sit, CGA + RW sit<>stand)  Bed Mobility Assistance Needed: Yes  Bed Mobility - Needs Assistance: Standby assist, Verbal assist/cues required (sliding up in bed, poor safety w/ RUE & impulsive movements)  Ambulation: CGA + RW for mobility in room, fast pace, inattentive to cues for safety      ADLs  ADLs: Needs assistance with ADLs  ADLs - Needs Assistance: Feeding, Grooming, Bathing, Toileting, UB dressing, LB dressing  Feeding - Needs Assistance: Set Up Assist, Performed at bed level  Grooming - Needs Assistance: Set Up Assist, Performed at Bed level  Bathing - Needs Assistance: Mod assist, Performed seated  Toileting - Needs Assistance: Mod assist (+ RW for standing portion)  UB Dressing - Needs Assistance: Verbal assist/cues required, Performed seated (SBA at EOB)  LB Dressing - Needs Assistance: Max assist (+ RW for standing portion)  IADLs: Total assist      Vitals / Orthostatics  At Rest: SpO2 93% on RA, HR 75  With Activity: SpO2 88-89% on RA, HR 80 in standing. SpO2 92% immediately upon return to sitting after mobility in room.  Orthostatics: Denied dizziness in sitting and standing      Medical Staff Made Aware: RNs Selena Batten and Visual merchandiser      Occupational Therapy Session Duration  OT Individual [mins]: 30 (c PT Passenger transport manager)  Reason for Co-treatment: Poor activity tolerance, To safely progress mobility         I attest that I have reviewed the above information.  Signed: Bernarda Caffey, OT  Filed 05/03/2020

## 2020-05-03 NOTE — Unmapped (Addendum)
Patient is alert and oriented x3-4. VSS. Afebrile. Assist with ADLS. No falls this shift. Bed alarm on.  Bed in lowest position and locked. Call bell within reach. Will continue with plan of care.    I notified provider about lost PIV and order okay to not have one until morning. No response received. Will put in for VAT to come replace PIV.    Problem: Adult Inpatient Plan of Care  Goal: Plan of Care Review  Outcome: Progressing  Flowsheets (Taken 05/03/2020 0447)  Progress: improving  Plan of Care Reviewed With: patient  Goal: Patient-Specific Goal (Individualized)  Outcome: Progressing  Flowsheets (Taken 05/03/2020 0447)  Patient-Specific Goals (Include Timeframe): Patient will be free from falls during this current hospitalizations  Individualized Care Needs: lactulose, falls precautions,  Anxieties, Fears or Concerns: none expressed  Goal: Absence of Hospital-Acquired Illness or Injury  Outcome: Progressing  Intervention: Identify and Manage Fall Risk  Recent Flowsheet Documentation  Taken 05/03/2020 0000 by Governor Rooks, RN  Safety Interventions:  ??? fall reduction program maintained  ??? environmental modification  Goal: Optimal Comfort and Wellbeing  Outcome: Progressing  Goal: Readiness for Transition of Care  Outcome: Progressing  Goal: Rounds/Family Conference  Outcome: Progressing     Problem: Self-Care Deficit  Goal: Improved Ability to Complete Activities of Daily Living  Outcome: Progressing     Problem: Fall Injury Risk  Goal: Absence of Fall and Fall-Related Injury  Outcome: Progressing  Intervention: Promote Injury-Free Environment  Recent Flowsheet Documentation  Taken 05/03/2020 0000 by Governor Rooks, RN  Safety Interventions:  ??? fall reduction program maintained  ??? environmental modification     Problem: Impaired Wound Healing  Goal: Optimal Wound Healing  Outcome: Progressing     Problem: Inadequate Tissue Perfusion (Sickle Cell Disease)  Goal: Effective Tissue Perfusion  Outcome: Progressing     Problem: Infection (Sickle Cell Disease)  Goal: Absence of Infection Signs and Symptoms  Outcome: Progressing     Problem: Pain (Sickle Cell Disease)  Goal: Acceptable Pain Control  Outcome: Progressing

## 2020-05-03 NOTE — Unmapped (Signed)
Aox4, VSS, afebrile, & free of falls/injuries during this RN shift. Some c/o abd pain, but declined interventions. Lost both IVs- this RN unable to get new access & oncoming RN made aware. Report given to oncoming RN. No further concerns at this time.

## 2020-05-03 NOTE — Unmapped (Signed)
PHYSICAL THERAPY  Evaluation (Seen with Henrietta Hoover, OT) (05/03/20 1000)     Patient Name:  [name] [name]       Medical Record Number: [id number]   Date of Birth: Sep 30, 1970  Sex: Male            Treatment Diagnosis: Generalized weakness, decreased endurance    Activity Tolerance: Tolerated treatment well    ASSESSMENT  Problem List: Decreased strength, Decreased mobility, Decreased safety awareness, Gait deviation, Decreased endurance, Impaired balance, Increased Edema, Fall Risk, Impaired ADLs     Assessment : [name] [name] is a 49 y.o. 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 Today this pt is demonstrating the above listed deficits impacting functional mobility. He demonstrates impaired sitting balance, with LOB requiring Mod A to correct at bedside today. Pt also demonstrates decreased safety awareness, and poor adherence to RUE NWB throughout. Based on his current functional status, recommend 5x(low) PT post-discharge. After a review of the personal factors, comorbidities, clinical presentation, and examination of the number of affected body systems, the patient presents as a moderate complexity case.     Today's Interventions: Evaluation, bed mobility(scooting up in bed with VC for use of LUE/BLEs), static sitting tolerance, static/dynamic standing tolerance, transfer training, ambulation. Educated pt regarding PT role and POC, nursing assist for all OOB mobility, NWB RUE esp limiting use during bed mobility, AP/HS throughout the day for edema management.         PLAN  Planned Frequency of Treatment:  1-2x per day for: 3-4x week      Planned Interventions: Balance activities, Endurance activities, Education - Patient, Functional cognition, Functional mobility, Gait training, Self-care / Home training, Transfer training, Home exercise program, Positioning, Therapeutic activity, Therapeutic exercise, Diaphragmatic / Pursed-lip breathing, Education - Family / caregiver    Post-Discharge Physical Therapy Recommendations:  5x weekly, Low intensity (vs 3x with 24/7 assistance from able-bodied individual)    PT DME Recommendations: Defer to post acute (Pt owns rollator)           Goals:   Patient and Family Goals: Not stated    Long Term Goal #1: Pt will score 20/20 on AMPAC in 4-6 weeks       SHORT GOAL #1: Supine<>sit with supervision, no LOB              Time Frame : 2 weeks  SHORT GOAL #2: Sit<>stand with supervision and LRAD              Time Frame : 2 weeks  SHORT GOAL #3: Pt will ambulate 100 ft with supervision and LRAD              Time Frame : 2 weeks                                        Prognosis:  Good  Positive Indicators: PLOF, participation  Barriers to Discharge: Decreased caregiver support, Endurance deficits, Impaired Balance, Gait instability, Decreased safety awareness, Functional strength deficits, Impulsivity    SUBJECTIVE     Patient reports: Pt agreeable for PT  Current Functional Status: Arrived/left with pt semi-fowler in bed, call bell and all needs within reach  Prior Functional Status: Pt reports he needs occasional assistance with mobility, esp with transfers. Uses his rollator with ambulation. Pt has been having increased difficulty performing ADLs recently. At least one fall in the past 6 months.  Equipment available at home: Rollator     Past Medical History:   Diagnosis Date   ??? Anemia    ??? Foot pain    ??? 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 Hypertension    ??? Pulmonary hypertension (CMS-HCC)    ??? Seizures (CMS-HCC)    ??? Sickle cell anemia (CMS-HCC)    ??? Sickle cell trait (CMS-HCC)    ??? Vitamin D deficiency     Social History     Tobacco Use   ??? Smoking status: Current Every Day Smoker     Years: 17.00     Types: Cigars   ??? Smokeless tobacco: Never Used   ??? Tobacco comment: 2 cigar daily   Substance Use Topics   ??? Alcohol use: Not Currently     Alcohol/week: 0.0 standard drinks     Comment: occasional      Past Surgical History:   Procedure Laterality Date   ??? PR ENDOSCOPIC US EXAM, ESOPH N/A 01/11/2020    Procedure: UGI ENDOSCOPY; WITH ENDOSCOPIC ULTRASOUND EXAMINATION LIMITED TO THE ESOPHAGUS;  Surgeon: Jules Husbands, MD;  [facility]: [facility];  Service: Gastroenterology   ??? PR RIGHT HEART CATH O2 SATURATION & CARDIAC OUTPUT N/A 07/18/2017    Procedure: Right Heart Catheterization;  Surgeon: Zannie Cove, MD;  Location: [facility] CATH;  Service: Cardiology   ??? PR RIGHT HEART CATH O2 SATURATION & CARDIAC OUTPUT N/A 04/07/2020    Procedure: Right Heart Catheterization;  Surgeon: Zannie Cove, MD;  Location: [facility] CATH;  Service: Cardiology    Family History   Problem Relation Age of Onset   ??? Hypertension Mother    ??? Hypertension Father    ??? No Known Problems Sister    ??? No Known Problems Brother    ??? No Known Problems Brother    ??? No Known Problems Brother    ??? No Known Problems Brother    ??? Sickle cell anemia Sister    ??? No Known Problems Sister    ??? Clotting disorder Paternal Grandmother    ??? No Known Problems Maternal Aunt    ??? No Known Problems Maternal Uncle    ??? No Known Problems Paternal Aunt    ??? No Known Problems Paternal Uncle    ??? No Known Problems Maternal Grandmother    ??? No Known Problems Maternal Grandfather    ??? No Known Problems Paternal Grandfather    ??? No Known Problems Other    ??? Seizures Neg Hx    ??? Pulmonary Hypertension Neg Hx    ??? Autoimmune disease Neg Hx    ??? Venous thrombosis Neg Hx    ??? Amblyopia Neg Hx    ??? Blindness Neg Hx    ??? Cancer Neg Hx    ??? Cataracts Neg Hx    ??? Diabetes Neg Hx    ??? Glaucoma Neg Hx    ??? Macular degeneration Neg Hx    ??? Retinal detachment Neg Hx    ??? Strabismus Neg Hx    ??? Stroke Neg Hx    ??? Thyroid disease Neg Hx    ??? Anesthesia problems Neg Hx    ??? Broken bones Neg Hx    ??? Collagen disease Neg  Hx    ??? Dislocations Neg Hx    ??? Fibromyalgia Neg Hx    ??? Gout Neg Hx    ??? Hemophilia Neg Hx    ??? Osteoporosis Neg Hx    ??? Rheumatologic disease Neg Hx    ??? Scoliosis Neg Hx    ??? Severe sprains Neg Hx    ??? Spinal Compression Fracture Neg Hx         Allergies: Ace inhibitors                Objective Findings  Precautions / Restrictions  Precautions: Falls precautions  Weight Bearing Status: RUE NWB - Non weight bearing (Per ortho note 7/23. Pt reports has been putting some weight through his RUE lately. Messaged primary team to clarify this date.)  Required Braces or Orthoses: Non-applicable    Communication Preference: Verbal   Pain Comments: Pt denied pain  Medical Tests / Procedures: Reviewed labs, vitals, imaging, orders, and POC. Pt with Hgb of 5.2 yesterday am with no transfusion since then; MD Crooks cleared for therapy, states pt's baseline is 6.  Equipment / Environment: Vascular access (PIV, TLC, Port-a-cath, PICC), Patient not wearing mask for full session    At Rest: HR: 77, SpO2: 92% on RA  With Activity: HR: 80, SpO2: 88% on RA in standing (however, poor pleth), 90-94% on RA post-ambulation  Orthostatics: Asymptomatic       Living Situation  Living Environment: House  Lives With: Father, Mother  Home Living: One level home, Tub/shower unit, Standard height toilet, Ramped entrance     Cognition  Cognition: Impaired/Limited  Cognition comment: Decreased safety awareness, impulsive, follows simple commands  Visual / Perception status  Visual/Perception: Impaired/Limited  Visual/Perception comment: Pt endorses blurry vision when fatigued. However, states vision is normal upon evaluation.  Skin Inspection: Visible skin appears intact    UE ROM / Strength  UE comment: Moves BUE antigravity, 5/5 grip strength bilaterally  LE ROM / Strength  LE ROM/Strength: Left Impaired/Limited, Right Impaired/Limited  RLE Impairment: Limited AROM, Reduced strength  LLE Impairment: Limited AROM, Reduced strength  LE comment: Decreased ankle DF bilaterally (pt with LE edema). Grossly 4/5 strength bilaterally.           Sensation: Denies numbness/tingling  Balance: Min A to CGA for static sitting with LUE support, one LOB to R requiring Mod A to correct, significant postural sway in sitting. CGA for static/dynamic standing with RW.         Bed Mobility: Supine<>sit with Min A, HOB slightly elevated.  Transfers: Sit<>stand with CGA and RW.   Gait  Level of Assistance: Contact guard assist, steadying assist  Assistive Device: Front wheel walker  Distance Ambulated (ft): 20 ft  Gait: Initially with rapid cadence, VC for safe speed/safe RW negotiation. Mild gait instability  Stairs: NT (pt with ramped entrance)      Endurance: Decreased endurance for basic mobility    Physical Therapy Session Duration  PT Individual [mins]: 25  Reason for Co-treatment: To safely progress mobility, Poor activity tolerance    Medical Staff Made Aware: RN Tarri Abernethy    I attest that I have reviewed the above information.  Signed: Skipper Cliche Nakiah Osgood, PT  [facility] 05/03/2020

## 2020-05-03 NOTE — Unmapped (Signed)
Internal Medicine (MEDU) Progress Note    Assessment & Plan:   Arville Lime is a 49 y.o. 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 *        Volume Overload I Cholestatic Hepatopathy  Following hepatology rec's, although not giving Spironolactone due to RTA with hyperkalemia. Unable to collect fluid for diagnostic para - can continue to assess for adequate/safe fluid pocket. Liver US with patent vasculature, cirrhosis and small volume ascites.  - if able, diagnostic para with total protein, albumin, cell count/diff, culture  - 2gm sodium diet  - nutrition consulted  - continue lasix 40mg  daily   - trend INR (per heme rec's below, will do every other day)  - continue home lactulose, titrate to 2-5 BM daily  - MRCP for elevated bili  - outpatient EGD for EV screening  ??  Sickle Cell Anemia, Hgb SS   Following hematology rec's. Do not transfuse without speaking to hematology, as he has multiple allo-antibodies.  - continue folic acid and home voxelotor 500mg  daily (home supply requested in VM to mother on 9/10)  - supplement 10mg  PO Vitamin K x3 days  - every other day CBC, reticulocytes, LDH, CMP, D Bili, Haptoglobin - collect in pediatric tubes to minimize blood loss  - EPO 15,000 units daily until discharge then resume 40,000 daily  - do not transfuse blood without discussing with hematology team  - heme is reaching out to blood bank for phenotypically matched blood for simple transfusion  - enoxaparin ppx 40mg  daily ok    Oliguric AKI I Sickle Cell Nephropathy I RTA w/ Hyperkalemia I Intolerance of ACEi/ARB  Patient has an elevated Cr (1.3 from baseline 0.8) in addition to his liver dysfunction, volume overload. Has documented proteinuria previously but typically normal GFR. US showed horseshoe kidney. No hydronephrosis. History of intolerance of ACE inhibitors and ARBs - has required treatment for hyperkalemia. Patient did receive 1 dose spironolactone yesterday afternoon and 1 dose this morning. Now discontinued.  - strict I/O   - daily weights  - no spironolactone   - one time CMP today for K, +/- lokelma if needed  ??  Generalized Weakness I Failure to Thrive  - PT/OT to eval and provide rec's  ??    Chronic Problems:  Hx of seizures and cognitive slowing 2/2 SCD   - continue home phenytoin and carbamazepine   - check dilantin, carbamezapine level       Daily Checklist:  Diet: Regular Diet  DVT PPx: Lovenox 40mg  q24h  Electrolytes: Replete Potassium to >/=4 and Magnesium to >/=2  Code Status: Full Code  Dispo: continue floor care  Team Contact Information:   Primary Team: Internal Medicine (MEDU)  Primary Resident: Ivonne Andrew, MD  Resident's Pager: [phone] (Gen MedU Intern - Cliffton Asters)    Interval History:   No acute events overnight. Mild diffuse abdominal pain. Not very talkative this morning - attempting to sleep      Objective:   Temp:  [35.3 ??C-36.8 ??C] 36.8 ??C  Heart Rate:  [72-77] 77  Resp:  [18-19] 18  BP: (118-135)/(57-67) 118/57  SpO2:  [91 %-97 %] 91 %    Gen: chronically ill appearing M in NAD  Eyes: scleral icterus present  HENT: atraumatic, dry cracked lips  Heart: RRR, S1, S2, blowing systolic murmur heard at R&L sternal borders. High pitched systolic murmur present at apex and radiates to axilla   Lungs: CTAB on anterior exam, no crackles or wheezes, no use of accessory muscles  Abdomen: Normoactive bowel sounds, moderately firm, not tender to mild palpation, no rebound/guarding  Psych: Alerts to voice    Labs/Studies: Labs and Studies from the last 24hrs per EMR and Reviewed      Alwyn Pea, MD   Neurology, PGY-1

## 2020-05-03 NOTE — Unmapped (Signed)
MRI on hold until screen form is completed in epic. Please answer all the questions in the form. If patient isn't able to answer the questions, please have immediate family member to help answer the questions and list their name and their number in the form. Patients needs to be in a hospital gown, IV should be med locked and medication patches should be removed prior to coming to MRI. Please call MRI at [phone] if you have any question. Thank you!

## 2020-05-04 LAB — CBC
HEMATOCRIT: 14.2 % — CL (ref 41.0–53.0)
HEMOGLOBIN: 4.5 g/dL — CL (ref 13.5–17.5)
MEAN CORPUSCULAR HEMOGLOBIN CONC: 31.9 g/dL (ref 31.0–37.0)
MEAN CORPUSCULAR HEMOGLOBIN: 38.6 pg — ABNORMAL HIGH (ref 26.0–34.0)
MEAN CORPUSCULAR VOLUME: 124 fL — ABNORMAL HIGH (ref 80.0–100.0)
MEAN PLATELET VOLUME: 11.5 fL — ABNORMAL HIGH (ref 7.0–10.0)
NUCLEATED RED BLOOD CELLS: 23 /100{WBCs} — ABNORMAL HIGH (ref <=4)
RED BLOOD CELL COUNT: 1.14 10*12/L — ABNORMAL LOW (ref 4.50–5.90)
RED CELL DISTRIBUTION WIDTH: 23 % — ABNORMAL HIGH (ref 12.0–15.0)
WBC ADJUSTED: 10.1 10*9/L (ref 4.5–11.0)

## 2020-05-04 LAB — COMPREHENSIVE METABOLIC PANEL
ALBUMIN: 1.1 g/dL — ABNORMAL LOW (ref 3.4–5.0)
ALKALINE PHOSPHATASE: 249 U/L — ABNORMAL HIGH (ref 46–116)
ALT (SGPT): 20 U/L (ref 10–49)
AST (SGOT): 80 U/L — ABNORMAL HIGH (ref <=34)
BILIRUBIN TOTAL: 34.6 mg/dL (ref 0.3–1.2)
CALCIUM: 9 mg/dL (ref 8.7–10.4)
CHLORIDE: 103 mmol/L (ref 98–107)
CREATININE: 0.73 mg/dL
EGFR CKD-EPI AA MALE: >90 mL/min/{1.73_m2} (ref >=60)
EGFR CKD-EPI NON-AA MALE: >90 mL/min/{1.73_m2} (ref >=60)
POTASSIUM: 5 mmol/L (ref 3.5–5.1)
SODIUM: 131 mmol/L — ABNORMAL LOW (ref 135–145)

## 2020-05-04 LAB — BILIRUBIN DIRECT: Bilirubin.glucuronidated+Bilirubin.albumin bound:MCnc:Pt:Ser/Plas:Qn:: 21.2 — ABNORMAL HIGH

## 2020-05-04 LAB — PHENYTOIN FREE: Phenytoin.free:MCnc:Pt:Ser/Plas:Qn:: 2.15 — ABNORMAL HIGH

## 2020-05-04 LAB — RETIC COUNT, MANUAL: Lab: 16.2 — ABNORMAL HIGH

## 2020-05-04 LAB — CHLORIDE: Chloride:SCnc:Pt:Ser/Plas:Qn:: 103

## 2020-05-04 LAB — SLIDE SCAN: Lab: 0

## 2020-05-04 MED ADMIN — lactulose (CHRONULAC) oral solution (30 mL cup): 30 g | ORAL | @ 18:00:00

## 2020-05-04 MED ADMIN — lactulose (CHRONULAC) oral solution (30 mL cup): 30 g | ORAL | @ 14:00:00

## 2020-05-04 MED ADMIN — carBAMazepine (TEGretol  XR) 12 hr tablet 800 mg: 800 mg | ORAL | @ 02:00:00

## 2020-05-04 MED ADMIN — folic acid (FOLVITE) tablet 1,000 mcg: 1000 ug | ORAL | @ 14:00:00

## 2020-05-04 NOTE — Unmapped (Signed)
Pt is alert and oriented x 3-4, more oriented this morning but did seem more confused by afternoon, and on room air.  Pt is getting lactulose.  Pt is a one person assist and very unstable on his feet.  Pt has bed in lowest position, call bell within reach, hourly rounding and bed alarm.  Pt has had no complaints of pain this shift.  Pt has lovenox subcu for VTE prevention.  No questions or concerns.  Will continue to monitor.  Problem: Adult Inpatient Plan of Care  Goal: Plan of Care Review  Outcome: Progressing  Goal: Patient-Specific Goal (Individualized)  Outcome: Progressing  Flowsheets (Taken 05/04/2020 1514)  Patient-Specific Goals (Include Timeframe): Patient will remain free of falls prior to dsicharge on 05/05/20  Individualized Care Needs: lactulose, falls/safety, monitor labs and vitals, bed alarm  Anxieties, Fears or Concerns: none expressed this shift  Goal: Absence of Hospital-Acquired Illness or Injury  Outcome: Progressing  Intervention: Identify and Manage Fall Risk  Recent Flowsheet Documentation  Taken 05/04/2020 1400 by Truddie Coco, RN  Safety Interventions: fall reduction program maintained  Taken 05/04/2020 1200 by Truddie Coco, RN  Safety Interventions: fall reduction program maintained  Taken 05/04/2020 1000 by Truddie Coco, RN  Safety Interventions: fall reduction program maintained  Taken 05/04/2020 0800 by Truddie Coco, RN  Safety Interventions: fall reduction program maintained  Goal: Optimal Comfort and Wellbeing  Outcome: Progressing  Goal: Readiness for Transition of Care  Outcome: Progressing  Goal: Rounds/Family Conference  Outcome: Progressing     Problem: Self-Care Deficit  Goal: Improved Ability to Complete Activities of Daily Living  Outcome: Progressing     Problem: Fall Injury Risk  Goal: Absence of Fall and Fall-Related Injury  Outcome: Progressing  Intervention: Promote Injury-Free Environment  Recent Flowsheet Documentation  Taken 05/04/2020 1400 by Truddie Coco, RN  Safety Interventions: fall reduction program maintained  Taken 05/04/2020 1200 by Truddie Coco, RN  Safety Interventions: fall reduction program maintained  Taken 05/04/2020 1000 by Truddie Coco, RN  Safety Interventions: fall reduction program maintained  Taken 05/04/2020 0800 by Truddie Coco, RN  Safety Interventions: fall reduction program maintained     Problem: Impaired Wound Healing  Goal: Optimal Wound Healing  Outcome: Progressing     Problem: Inadequate Tissue Perfusion (Sickle Cell Disease)  Goal: Effective Tissue Perfusion  Outcome: Progressing     Problem: Infection (Sickle Cell Disease)  Goal: Absence of Infection Signs and Symptoms  Outcome: Progressing     Problem: Pain (Sickle Cell Disease)  Goal: Acceptable Pain Control  Outcome: Progressing

## 2020-05-04 NOTE — Unmapped (Signed)
Pt A&Ox4, more alert today, fall precautions maintained, 1-2x assist with walker to chair, VSS, locked and low bed, bed alarm in use, continuing with plan of care.    Problem: Adult Inpatient Plan of Care  Goal: Readiness for Transition of Care  Outcome: Ongoing - Unchanged  Goal: Rounds/Family Conference  Outcome: Ongoing - Unchanged     Problem: Self-Care Deficit  Goal: Improved Ability to Complete Activities of Daily Living  Outcome: Ongoing - Unchanged     Problem: Fall Injury Risk  Goal: Absence of Fall and Fall-Related Injury  Outcome: Ongoing - Unchanged  Intervention: Promote Injury-Free Environment  Recent Flowsheet Documentation  Taken 05/03/2020 1720 by Camila Li, RN  Safety Interventions:   fall reduction program maintained   low bed   bed alarm     Problem: Impaired Wound Healing  Goal: Optimal Wound Healing  Outcome: Ongoing - Unchanged  Intervention: Promote Wound Healing  Recent Flowsheet Documentation  Taken 05/03/2020 1720 by Camila Li, RN  Activity Management:   activity adjusted per tolerance   up in chair  Pain Management Interventions: medication offered but refused     Problem: Inadequate Tissue Perfusion (Sickle Cell Disease)  Goal: Effective Tissue Perfusion  Outcome: Ongoing - Unchanged     Problem: Infection (Sickle Cell Disease)  Goal: Absence of Infection Signs and Symptoms  Outcome: Ongoing - Unchanged  Intervention: Prevent or Manage Infection  Recent Flowsheet Documentation  Taken 05/03/2020 1720 by Camila Li, RN  Infection Prevention:   hand hygiene promoted   equipment surfaces disinfected     Problem: Pain (Sickle Cell Disease)  Goal: Acceptable Pain Control  Outcome: Ongoing - Unchanged  Intervention: Manage Acute Pain  Flowsheets (Taken 05/03/2020 1720)  Pain Management Interventions: medication offered but refused     Problem: Adult Inpatient Plan of Care  Goal: Plan of Care Review  Outcome: Progressing  Flowsheets (Taken 05/03/2020 1720)  Progress: improving  Plan of Care Reviewed With: patient  Goal: Patient-Specific Goal (Individualized)  Outcome: Progressing  Flowsheets (Taken 05/03/2020 1720)  Patient-Specific Goals (Include Timeframe): Patient will be free from falls and injury throughout shift 0700-1900  Individualized Care Needs: Lactulose, fall precautions, activity encouraged  Anxieties, Fears or Concerns: None expressed  Goal: Absence of Hospital-Acquired Illness or Injury  Outcome: Progressing  Intervention: Identify and Manage Fall Risk  Flowsheets (Taken 05/03/2020 1720)  Safety Interventions:   fall reduction program maintained   low bed   bed alarm  Intervention: Prevent Skin Injury  Flowsheets (Taken 05/03/2020 1720)  Body Position: weight shifting  Skin Protection: adhesive use limited  Intervention: Prevent and Manage VTE (Venous Thromboembolism) Risk  Flowsheets (Taken 05/03/2020 1720)  Activity Management:   activity adjusted per tolerance   up in chair  Intervention: Prevent Infection  Flowsheets (Taken 05/03/2020 1720)  Infection Prevention:   hand hygiene promoted   equipment surfaces disinfected  Goal: Optimal Comfort and Wellbeing  Outcome: Progressing  Intervention: Monitor Pain and Promote Comfort  Flowsheets (Taken 05/03/2020 1720)  Pain Management Interventions: medication offered but refused

## 2020-05-04 NOTE — Unmapped (Signed)
Internal Medicine (MEDU) Progress Note    Assessment & Plan:   [name] [name] is a 49 y.o. 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 *        Volume Overload I Cholestatic Hepatopathy  Following hepatology rec's, awaiting MRCP. Confusion and asterixis worsens when not meeting goal BMs - only had 1 yesterday.  - if able, diagnostic para with total protein, albumin, cell count/diff, culture  - 2gm sodium diet  - nutrition consulted  - continue lasix 40mg  daily   - trend INR (per heme rec's below, will do every other day)  - continue home lactulose, titrate to 2-5 BM daily  - MRCP for elevated bili  - outpatient EGD for EV screening  ??  Sickle Cell Anemia, Hgb SS   Following hematology rec's. Do not transfuse without speaking to hematology, as he has multiple allo-antibodies.  - continue folic acid and home voxelotor 500mg  daily (mother will bring tomorrow)  - supplement 10mg  PO Vitamin K x3 days  - every other day CBC, reticulocytes, LDH, CMP, D Bili, Haptoglobin - collect in pediatric tubes to minimize blood loss  - EPO 15,000 units daily until discharge then resume 40,000 daily  - do not transfuse blood without discussing with hematology team  - enoxaparin ppx 40mg  daily ok    Oliguric AKI I Sickle Cell Nephropathy I RTA w/ Hyperkalemia I Intolerance of ACEi/ARB  Creatinine at baseline this morning. Potassium stable.   - strict I/O   - daily weights  - no spironolactone   - CMP every other day  ??  Generalized Weakness I Failure to Thrive  PT recommendation 5x/low. Will discuss possibility of SNF with mother and case manager tomorrow.  - PT/OT  ??    Chronic Problems:  Hx of seizures and cognitive slowing 2/2 SCD   Total phenytoin level elevated yesterday, however, in patients with poor hepatic function, levels should be monitored with free phenytoin level. Held this morning and will be dosed by pharmacy pending results of free phenytoin level. Unable to check Carbamazepine level while Bili is so elevated - sample will be rejected. Pharmacy aware and will adjust as they feel necessary.   - free phenytoin level pending - pharmacy to dose   - unable to check Carbamazepine level until Bili drops - pharmacy to recommend dosing changes if necessary        Daily Checklist:  Diet: Regular Diet  DVT PPx: Lovenox 40mg  q24h  Electrolytes: Replete Potassium to >/=4 and Magnesium to >/=2  Code Status: Full Code  Dispo: continue floor care  Team Contact Information:   Primary Team: Internal Medicine (MEDU)  Primary Resident: Ivonne Andrew, MD  Resident's Pager: [phone] (Gen MedU Intern - Cliffton Asters)    Interval History:   No acute events overnight. Feels a little worse this morning. More difficulty taking deep breaths. Frustrated at how many times he is interrupted for vital signs and blood draws. No chest pain. We discussed the importance of taking his lactulose.    Objective:   Temp:  [35.1 ??C-36.6 ??C] 36.5 ??C  Heart Rate:  [61-76] 76  Resp:  [17-18] 17  BP: (109-137)/(56-64) 109/56  SpO2:  [90 %-96 %] 92 %    Gen: chronically ill appearing M in NAD  Eyes: scleral icterus present  HENT: atraumatic, dry cracked lips  Heart: RRR, S1, S2, blowing systolic murmur heard at R&L sternal borders. High pitched systolic murmur present at apex  Lungs: CTAB on anterior exam, no crackles or wheezes, no use of accessory muscles  Abdomen: Normoactive bowel sounds, moderately firm, mildly tender to palpation, no rebound/guarding  Psych: Alerts to voice  Neuro: face symmetric. Low frequency flapping bilateral asterixis present.    Labs/Studies: Labs and Studies from the last 24hrs per EMR and Reviewed      [name] Pea, MD   Neurology, PGY-1

## 2020-05-04 NOTE — Unmapped (Addendum)
Care Management  Initial Transition Planning Assessment    CM met with patient in pt room.  Pt/visitors were not wearing hospital provided masks for the duration of the interaction with CM. CM was wearing hospital provided surgical mask and hospital provided eye protection.  CM was not within 6 foot of the patient/visitors during this interaction.               General  Care Manager assessed the patient by : In person interview with patient  Orientation Level: Oriented X4  Functional level prior to admission: Independent  Reason for referral: Discharge Planning    Contact/Decision Maker  Extended Emergency Contact Information  Primary Emergency Contact: [name] Sr,[name]   [place]  Home Phone: [phone]  Mobile Phone: [phone]  Relation: Mother    Legal Next of Kin / Guardian / POA / Advance Directives     HCDM (patient stated preference): [name] [name] - Mother - [phone]    Advance Directive (Medical Treatment)  Does patient have an advance directive covering medical treatment?: Patient does not have advance directive covering medical treatment.  Reason patient does not have an advance directive covering medical treatment:: Patient does not wish to complete one at this time.    Health Care Decision Maker [HCDM] (Medical & Mental Health Treatment)  Healthcare Decision Maker: HCDM documented in the HCDM/Contact Info section.  Information offered on HCDM, Medical & Mental Health advance directives:: Patient given information.    Advance Directive (Mental Health Treatment)  Does patient have an advance directive covering mental health treatment?: Patient does not have advance directive covering mental health treatment.  Reason patient does not have an advance directive covering mental health treatment:: HCDM documented in the HCDM/Contact Info section.    Per H&P: [name] [name] is a 49 y.o. 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   ??  SW introduced herself and explained this SW role to pt. SW confirmed pt demographics. Pt lives alone in a trailer home with four steps leading to entrance. Pt was able to complete ADLs independently prior to admissions. Pt has a walker at home. Pt denied having any SDOH, substance abuse, or mental health issues. Pt has his mother for family support. No further concerns at this time.  SW will continue to follow patient and family for continued support, avoidable delays, discharge planning and opportunities for progression of care.      Patient Information  Lives with: Alone    Type of Residence: Private residence        Location/Detail: Home in [place]    Support Systems/Concerns: Parent    Responsibilities/Dependents at home?: No    Home Care services in place prior to admission?: No     Currently receiving outpatient dialysis?: No    Financial Information    Need for financial assistance?: No    Social Determinants of Health  Social Determinants of Health were addressed in provider documentation.  Please refer to patient history.    Discharge Needs Assessment  Concerns to be Addressed:      Clinical Risk Factors:      Prior overnight hospital stay or ED visit in last 90 days: Yes    Anticipated Changes Related to Illness: none    Equipment Needed After Discharge: walker,  hemi    Discharge Facility/Level of Care Needs:      Readmission  Risk of Unplanned Readmission Score: UNPLANNED READMISSION SCORE: 23%  Predictive Model Details          23% (High)  Factor Value    Calculated 05/04/2020 16:03 20% Number of ED visits in last six months 4    [place] Risk of Unplanned Readmission Model 20% Number of active Rx orders 32     10% Charlson Comorbidity Index 10     9% Number of hospitalizations in last year 2     8% ECG/EKG order present in last 6 months     7% Diagnosis of electrolyte disorder present     6% Imaging order present in last 6 months     5% Latest hemoglobin low (4.5 g/dL)     4% Diagnosis of deficiency anemia present     4% Active anticoagulant Rx order present     3% Age 18     3% Current length of stay 3.032 days     2% Future appointment scheduled      Readmitted Within the Last 30 Days? (No if blank)   Patient at risk for readmission?: Yes    Discharge Plan  Screen findings are: Care Manager reviewed the plan of the patient's care with the Multidisciplinary Team. No discharge planning needs identified at this time. Care Manager will continue to manage plan and monitor patient's progress with the team.    Expected Discharge Date: 05/05/2020    Expected Transfer from Critical Care:      Quality data for continuing care services shared with patient and/or representative?: N/A  Patient and/or family were provided with choice of facilities / services that are available and appropriate to meet post hospital care needs?: N/A

## 2020-05-04 NOTE — Unmapped (Signed)
Phenytoin Therapeutic Monitoring Pharmacy Note    [name] [name] is a 49 y.o. year old male continuing phenytoin.     Indication: Treatment of seizures.    Prior Dosing Information: Home regimen dilantin 100 mg AM and 200 mg PM (per patient's mother):     Goals:  Therapeutic Drug Levels  ??? Total phenytoin level (in absence of free level): 10-20 mcg/mL  ??? Free phenytoin level: 1-2 mcg/mL.    Additional Clinical Monitoring Outcomes  Hepatic function, serum albumin, elevated BUN, seizure frequency/cessation    Results:   ??? Total phenytoin level: Not applicable  ??? Free phenytoin level: 2.67 mcg/mL, drawn 3 hours early  ??? Calculated Free Fraction: Not applicable     Pharmacokinetic Considerations and Significant Drug Interactions:   ??? Interacting medications: carbamazepine  ??? Enteral nutrition: no    Assessment/Plan:  Recommendation(s)  Free phenytoin level was supratherapeutic. Will hold PM dose and recheck level in the morning. Pharmacist will redose per AM level.    Follow-up  ?? Level to be obtained: 09/12 at 0400    ?? Continue monitoring signs/symptoms of worsening renal and/or hepatic function       Please page service pharmacist with questions/clarifications.    Hali Marry, PharmD  PGY1 Pharmacy Resident

## 2020-05-04 NOTE — Unmapped (Signed)
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 male with sickle cell anemia c/b congestive hepatopathy, CNS disease??(cognitive??slowing??and??prior??seizures),??pHTN??that presented to [facility]??with acute on chronic worsening volume overload (belly distended and LE edema) as well as worsening DOE and weakness.??    Received consult for 0.4 cm dry scab to Right shin.  Called and discussed with primary RN [name] [name].  Recommend leaving open to air unless there is a risk of scab being dislodged in which case it would be appropriate to cover with small silicone bordered foam dressing.     Lab Results   Component Value Date    WBC 8.0 05/02/2020    HGB 5.2 (L) 05/02/2020    HCT 15.1 (L) 05/02/2020    CRP 19.5 (H) 02/21/2019    GLUF 91 12/27/2012    GLU  05/03/2020      Comment:      Specimen icteric.      POCGLU 82 01/11/2020    ALBUMIN 1.1 (L) 05/03/2020    PROT 4.1 (L) 05/03/2020       Type Debridement Completed By [facility]:  N/A    Teaching:  - Wound care    [facility] Recommendations:   - Contact [facility] with questions, concerns, or wound deterioration.    Topical Therapy/Interventions:   - Silicone foam dressing if needed    Recommended Consults:  - Not Applicable    [facility] Follow Up:  - We will sign off at this time    Plan of Care Discussed With:   - RN [name] [name]    Supplies Ordered: No    Workup Time:   15 minutes   [name] Ratel MA BSN RN [facility]  [facility] Consult Service  Pager [phone]  Phone [phone]

## 2020-05-05 DIAGNOSIS — D571 Sickle-cell disease without crisis: Principal | ICD-10-CM

## 2020-05-05 MED ORDER — VOXELOTOR 500 MG TABLET
ORAL_TABLET | Freq: Every day | ORAL | 11 refills | 30.00000 days | Status: CP
Start: 2020-05-05 — End: ?
  Filled 2020-05-05: qty 30, 30d supply, fill #0

## 2020-05-05 MED ORDER — PROCRIT 40,000 UNIT/ML INJECTION SOLUTION
SUBCUTANEOUS | 3 refills | 28.00000 days
Start: 2020-05-05 — End: 2021-05-05

## 2020-05-05 MED ADMIN — enoxaparin (LOVENOX) syringe 40 mg: 40 mg | SUBCUTANEOUS | @ 13:00:00

## 2020-05-05 MED ADMIN — folic acid (FOLVITE) tablet 1,000 mcg: 1000 ug | ORAL | @ 13:00:00

## 2020-05-05 MED ADMIN — phenytoin (DILANTIN) ER capsule 100 mg: 100 mg | ORAL | @ 13:00:00

## 2020-05-05 MED ADMIN — epoetin alfa-EPBX (RETACRIT) 15,000 Units injection: 15000 [IU] | SUBCUTANEOUS | @ 12:00:00

## 2020-05-05 MED ADMIN — cholecalciferol (vitamin D3-10 mcg (400 unit)) tablet 10 mcg: 10 ug | ORAL | @ 13:00:00

## 2020-05-05 MED ADMIN — carBAMazepine (TEGretol  XR) 12 hr tablet 400 mg: 400 mg | ORAL | @ 13:00:00

## 2020-05-05 MED ADMIN — traMADoL (ULTRAM) tablet 50 mg: 50 mg | ORAL | @ 14:00:00

## 2020-05-05 MED FILL — OXBRYTA 500 MG TABLET: 30 days supply | Qty: 30 | Fill #0 | Status: AC

## 2020-05-05 NOTE — Unmapped (Signed)
Adult Nutrition Assessment Note    Visit Type: RN Consult  Reason for Visit: Have you gained or lost 10 pounds in the past 3 months?      HPI & PMH:  [name] [name] is a 49 y.o. male with sickle cell anemia c/b congestive hepatopathy, CNS disease??(cognitive??slowing??and??prior??seizures),??pHTN??that presented to [facility]??with acute on chronic worsening volume overload (belly distended and LE edema) as well as worsening DOE and weakness.??    Anthropometric Data:  Height: 167.6 cm (5' 5.98)   Admission weight: 72 kg (158 lb 11.7 oz)  Last recorded weight: 68 kg (150 lb)  IBW: 64.44 kg  Percent IBW: 105.59 %  BMI: Body mass index is 24.22 kg/m??.   Usual Body Weight: 150 lbs    Weight history prior to admission: Pt reports UBW of 150 lbs with recent 8 lb weight gain, likely from increased edema.   Wt Readings from Last 10 Encounters:   05/05/20 68 kg (150 lb)   05/03/20 64.6 kg (142 lb 6.4 oz)   04/07/20 64.7 kg (142 lb 9.6 oz)   03/14/20 64.2 kg (141 lb 8 oz)   03/06/20 68 kg (150 lb)   02/20/20 70.5 kg (155 lb 6 oz)   01/26/20 68.6 kg (151 lb 3.8 oz)   01/12/20 68.8 kg (151 lb 10.8 oz)   10/10/19 65.1 kg (143 lb 8 oz)   08/15/19 62.2 kg (137 lb 2 oz)        Weight changes this admission:   Last 5 Recorded Weights    05/04/20 0400 05/05/20 1316   Weight: 72 kg (158 lb 11.7 oz) 68 kg (150 lb)        Nutrition Focused Physical Exam:  Nutrition Focused Physical Exam:                Nutrition Evaluation  Overall Impressions: Fluid accumulation noted, however it is not malnutrition related;Unable to perform Nutrition-Focused Physical Exam at this time due to (comment) (due to patient inability to participate in exam (patient was agitated and lethargic).) (05/05/20 1316)  Nutrition Designation: Normal weight (BMI 18.50 - 24.99 kg/m2) (Simultaneous filing. User may not have seen previous data.) (05/05/20 1316)      NUTRITIONALLY RELEVANT DATA     Medications:   Nutritionally pertinent medications reviewed and evaluated for potential food and/or medication interactions and include phenytoin. Relevant if the patient ever requires enteral tube feeding while on phenytoin. Patient is currently po.     Labs:   Nutritionally pertinent labs reviewed.     Nutrition History:   Prior to admission: Patient reports UBW of 150 lbs with recent 8 lb weight gain with the increased swelling/edema. Patient reports decrease in appetite and only being able to complete 50% of his meals over the past 2 weeks. Patient reports drinking a Boost each day for the past 1-2 weeks. Patient currently on Na-restricted diet per MD team order.     Allergies, Intolerances, Sensitivities, and/or Cultural/Religious Dietary Restrictions: none identified at this time     Current Nutrition:  Oral intake        Nutrition Orders   (From admission, onward)             Start     Ordered    05/05/20 1700  Supplement Adult; Ensure Plus (Standard High Calorie); # of Products PER Serving: 1 3xd Meals  3 times a day (with meals)     Question Answer Comment   Supplement (INP): Adult    Select  Supplement Ensure Plus (Standard High Calorie)    # of Products PER Serving 1        05/05/20 1340    05/02/20 0055  Nutrition Therapy Regular/House; Sodium Restricted (2 gm Na+)  Effective now     Question Answer Comment   Nutrition Therapy: Regular/House    Electrolytes Restriction: Sodium Restricted (2 gm Na+)        05/02/20 0054                 Nutritional Needs:   Daily Estimated Nutrient Needs:  Energy: 1700-2040 kcals 25-30 kcal/kg using estimated dry weight,  ]  Protein: 82-102 gm [1.2-1.5 gm/kg using estimated dry weight, 68 kg (05/05/20 1316)]  Carbohydrate:   [no restriction]  Fluid:   mL [per MD team]      Malnutrition Assessment using AND/ASPEN Clinical Characteristics:    Patient does not meet AND/ASPEN criteria for malnutrition at this time (05/05/20 1316)       GOALS and EVALUATION     ??? Patient to meet 75% or greater of nutritional needs via combination of meals, snacks, and/or oral supplements within one week.  - New/Progressing    Motivation, Barriers, and Compliance:  Evaluation of motivation, barriers, and compliance completed. No concerns identified at this time.     NUTRITION ASSESSMENT     ??? Current nutrition therapy is appropriate and progressing toward meeting meeting nutritional needs at this time.   ??? Patient would benefit from continuing oral supplement to better meet nutritional needs.   ??? Patient would benefit from a calorie count due to communication difficulties/confusion/forgetfulness with the patient.    Discharge Planning:   Evaluation of discharge needs pending at this time due to clinical status.       NUTRITION INTERVENTIONS and RECOMMENDATION     1. Placed a calorie count to assess whether patient is consuming adequate nutritional intake po.   2. Placed Ensure order for patient to help meet nutritional needs.     Follow-Up Parameters:   1-2 times per week (and more frequent as indicated)    Orland Mustard Al-Shaer, Dietetic Intern  Pager #: [phone]    I have reviewed the intern's note and agree with the findings and plan.     Karie Chimera, MS, RD, LDN, CNSC  Pager # [phone]

## 2020-05-05 NOTE — Unmapped (Signed)
Hematology Consult Follow-Up Note    Requesting Attending Physician :  Earna Coder, MD  Service Requesting Consult : Med General Doristine Counter (MD[facility])  Reason for Consult: co-management of HgB SS disease   Primary Hematologist: Dr Nigel Berthold    Assessment: [name] [name] is 49 y.o. with HgB SS disease, decompensated congestive hepatopathy, pulmonary HTN, ascites who was admitted for worsening ascites and lower extremity edema. Hematology was consulted to optimize his sickle cell disease in the setting of worsening hepatic function.    1. Macrocytic anemia (MCV 121.9, HgB 5.2): noted since 2016, MCV (156-327) and HgB 5.2-7.7. Risk factors for macrocytosis are A) nutritional deficiencies- folate in 01/12/20 was >20 but there is no Vit B12 on file B) drug exposure, he is taking phenytoin which is known to cause macrocytosis. C) EToH but he denies consuming EToH D) and hypothyroidism however his thyroid levels have been normal as recently as 03/21/2019 and he has not reported symptoms of hypothyroidism. Will recommend checking Vit B12, supplement if needed, but recommend monitoring.     2. Thrombocytopenia: Plt downtrending since 09/26/2019 and was 81 on admission and intermittently low in 2020, with clumping eported on 7/23. MPV has been increasing from 9 to 11.5. The most likely cause of his thrombocytopenia is decreased TPO production from hepatic dysfunction, increased destruction, and DIC. On admission he has elevated PT, PTT, decreased fibrinogen (122), most likely elevated d-dimer.  Recommend monitoring his platelet count provided it remains >50,000 this is sufficient for hemostasis at this time.       3. HgB SS disease- baseline HgB 5.5-6.5 g/dl (HgB today is 2.1H/YQ) and get EPO 65H units twice weekly and was on Voxeletor 500mg  as recently as June 2021. He has multiorgan disease involvement and most notable for congestive hepatopathy which has been worsening and required multiple hospitalizations in May and June of this year and on biopsy had overall appearance of the liver with congestion, sinusoidal fibrosis, and hepatocyte atrophy are suggestive of outflow obstruction and are likely related to/exacerbated by the patient's history of sickle cell anemia. As a consequence of his disease he is also difficult to transfuse as he has multiple allo-antibodies.       Recommendations:   1. Continue folic acid and resume voxelotor 500mg  daily  - please obtain home supply as it is not on formulary at our pharmacy  2. Recommend cbc, reticulocyte, LDH, CMP, and dbili every other day  3. Collect all blood samples in pediatric tubes to minimize blood loss.   4. Continue EPO 15000 units daily until discharge, then resume to 40, 000 twice weekly  5. Please do not transfuse blood without discussing this with the Hematology team as he has multiple allo-antibodies and is at risk for hyper-hemolysis.   6. Continue nocturnal oxygen   7. Discussed with Transfusion Medicine to obtain phenotypically matched blood for a simple transfusion in the event of an emergency  - Discussed with Transfusion Medicine and it is thought that this would be too risky   8. Appreciate the assistance of GI in the management of his liver disease   9. Continue enoxaparin 40mg  prophylaxis daily   10. Encourage ambulation and ICS q37mins while awake.  11. Will try to arrange a family meeting with mom tomorrow at noon.       This patient has been seen and discussed with Dr. Janie Morning. These recommendations were discussed with the primary team.     Please contact the hematology fellow at [phone]  with any further questions.    [name], MD  Hematology-Oncology Fellow    -------------------------------------------------------------    HPI: [name] [name] is a 49 y.o. man with HgB SS disease, liver disease, epilepsy, and pulmonary HTN who was admitted in 9/9 with worsening abdominal distention, bilateral LE edema, and DOE. He is being seen at the request of Earna Coder, MD for co-management of his HgB SS disease.     [name] [name] reports that started 3-4 weeks ago he then started developing leg swelling but without pain. Gradually his face also started to swell and it became harder for him to breathe prompting im to come to the ED for evaluation. He reports that sometimes his abdomen hurts in the right upper quadrant and has noticed that his urine looks more tea colored.     Interval history-   HgB 4.5 (from 5.2), ARC 199.3 <- 176.9). Na 131, Cr 0.66->0.73, with increased tbibi 31.3->34.6 and dbili 17 ->21.2.     I spoke with [name] [name]' mom today and she is aware he is not doing well. His abdomen is swollen and distended. She is open to meeting with his team tomorrow at noon if possible.     Review of Systems: Review of Systems - a 12 point ROS is negative unless stated in the HPI    Allergies: is allergic to ace inhibitors.    Medications:   Meds:  ??? carBAMazepine  400 mg Oral Daily   ??? carBAMazepine  800 mg Oral Nightly   ??? cholecalciferol (vitamin D3-10 mcg (400 unit))  10 mcg Oral Daily   ??? enoxaparin (LOVENOX) injection  40 mg Subcutaneous Q24H [facility]   ??? epoetin alfa-EPBX  15,000 Units Subcutaneous QAM AC   ??? folic acid  1,000 mcg Oral Daily   ??? furosemide  40 mg Oral Daily   ??? lactulose  30 g Oral TID   ??? phenytoin  100 mg Oral BID   ??? polyethylen glycol  17 g Oral Daily     Continuous Infusions:  PRN Meds:.acetaminophen, aluminum-magnesium hydroxide-simethicone, diclofenac sodium, guaiFENesin, melatonin, ondansetron **OR** ondansetron, senna, traMADoL    Objective:   Vitals: Temp:  [35.7 ??C (96.2 ??F)-36.4 ??C (97.5 ??F)] 36 ??C (96.8 ??F)  Heart Rate:  [76-84] 81  Resp:  [16-18] 18  BP: (111-118)/(57-70) 118/58  SpO2:  [90 %] 90 %    Physical Exam:  BP 118/58  - Pulse 81  - Temp 36 ??C (96.8 ??F) (Oral)  - Resp 18  - Wt 72 kg (158 lb 11.7 oz)  - SpO2 90%  - BMI 25.62 kg/m??    General appearance - sleeping initially, but rouses to conversation.  Mental status - alert, oriented to person, place, and time   Eyes - pupils equal and reactive, extraocular eye movements intact, sclera icteric    Nose - normal and patent, no erythema, discharge or polyps   Mouth - mucous membranes moist, pharynx normal without lesions   Neck - supple   Lymphatics - no palpable lymphadenopathy   Pulmonary - CTAB, shallow respirations    Cardiovascular- RRR, no MRG   Gastrointestinal - distended, taunt abdomen, mildly tender to palpation over the R side   Neurological - alert, oriented, normal speech, no focal findings or movement disorder noted   Musculoskeletal - no joint tenderness, deformity or swelling   Extremities - pitting edema to the lower knees   Skin - icteric, areas of hyperpigmentation noted on the lower legs  Test Results  Recent Labs     05/04/20  0732   WBC 10.1   HGB 4.5*   PLT 87*       Imaging    US Liver Doppler 05/02/20  1. Patent hepatic vasculature with normal flow direction.  2. Chronic liver disease with findings of cirrhosis and small volume ascites.  3. Horseshoe kidney.  4. Poorly evaluated gallbladder with suggestion of wall thickening, likely secondary to underlying hepatic dysfunction.

## 2020-05-05 NOTE — Unmapped (Signed)
Quiet. Alert,oiented x 4. Pox 90% on room air. Vss. Denies pain. HS Lactulose held -6 stools during the day. One person assist to bathroom.    Problem: Adult Inpatient Plan of Care  Goal: Plan of Care Review  Outcome: Progressing  Goal: Patient-Specific Goal (Individualized)  Outcome: Progressing  Flowsheets (Taken 05/05/2020 0420)  Patient-Specific Goals (Include Timeframe): Patient will remain free of falls prior to dsicharge on 05/05/20  Individualized Care Needs: lactulose, falls/safety, monitor labs and vitals, bed alarm  Anxieties, Fears or Concerns: none expressed this shift  Goal: Absence of Hospital-Acquired Illness or Injury  Outcome: Progressing  Intervention: Identify and Manage Fall Risk  Recent Flowsheet   Safety Interventions: fall reduction program maintained    Goal: Optimal Comfort and Wellbeing  Outcome: Progressing  Goal: Readiness for Transition of Care  Outcome: Progressing  Goal: Rounds/Family Conference  Outcome: Progressing     Problem: Self-Care Deficit  Goal: Improved Ability to Complete Activities of Daily Living  Outcome: Progressing     Problem: Fall Injury Risk  Goal: Absence of Fall and Fall-Related Injury  Outcome: Progressing  Intervention: Promote Scientist, clinical (histocompatibility and immunogenetics) Documentation  Taken 05/04/2020 1400 by Truddie Coco, RN  Safety Interventions: fall reduction program maintained  Taken 05/04/2020 1200 by Truddie Coco, RN  Safety Interventions: fall reduction program maintained  Taken 05/04/2020 1000 by Truddie Coco, RN  Safety Interventions: fall reduction program maintained  Taken 05/04/2020 0800 by Truddie Coco, RN  Safety Interventions: fall reduction program maintained     Problem: Impaired Wound Healing  Goal: Optimal Wound Healing  Outcome: Progressing     Problem: Inadequate Tissue Perfusion (Sickle Cell Disease)  Goal: Effective Tissue Perfusion  Outcome: Progressing     Problem: Infection (Sickle Cell Disease)  Goal: Absence of Infection Signs and Symptoms  Outcome: Progressing     Problem: Pain (Sickle Cell Disease)  Goal: Acceptable Pain Control  Outcome: Progressing

## 2020-05-05 NOTE — Unmapped (Signed)
Quiet. Alert,oiented x 4. Pox 90% on room air. Vss. Denies pain. HS Lactulose held -6 stools during the day. One person assist to bathroom.    Problem: Adult Inpatient Plan of Care  Goal: Plan of Care Review  Outcome: Progressing  Goal: Patient-Specific Goal (Individualized)  Outcome: Progressing  Flowsheets (Taken 05/04/2020 0420)  Patient-Specific Goals (Include Timeframe): Patient will remain free of falls prior to dsicharge on 05/05/20  Individualized Care Needs: lactulose, falls/safety, monitor labs and vitals, bed alarm  Anxieties, Fears or Concerns: none expressed this shift  Goal: Absence of Hospital-Acquired Illness or Injury  Outcome: Progressing  Intervention: Identify and Manage Fall Risk  Recent Flowsheet   Safety Interventions: fall reduction program maintained    Goal: Optimal Comfort and Wellbeing  Outcome: Progressing  Goal: Readiness for Transition of Care  Outcome: Progressing  Goal: Rounds/Family Conference  Outcome: Progressing     Problem: Self-Care Deficit  Goal: Improved Ability to Complete Activities of Daily Living  Outcome: Progressing     Problem: Fall Injury Risk  Goal: Absence of Fall and Fall-Related Injury  Outcome: Progressing  Intervention: Promote Scientist, clinical (histocompatibility and immunogenetics) Documentation  Taken 05/04/2020 1400 by Truddie Coco, RN  Safety Interventions: fall reduction program maintained  Taken 05/04/2020 1200 by Truddie Coco, RN  Safety Interventions: fall reduction program maintained  Taken 05/04/2020 1000 by Truddie Coco, RN  Safety Interventions: fall reduction program maintained  Taken 05/04/2020 0800 by Truddie Coco, RN  Safety Interventions: fall reduction program maintained     Problem: Impaired Wound Healing  Goal: Optimal Wound Healing  Outcome: Progressing     Problem: Inadequate Tissue Perfusion (Sickle Cell Disease)  Goal: Effective Tissue Perfusion  Outcome: Progressing     Problem: Infection (Sickle Cell Disease)  Goal: Absence of Infection Signs and Symptoms  Outcome: Progressing     Problem: Pain (Sickle Cell Disease)  Goal: Acceptable Pain Control  Outcome: Progressing

## 2020-05-05 NOTE — Unmapped (Signed)
Phenytoin Therapeutic Monitoring Pharmacy Note    [name] [name] is a 49 y.o. year old male continuing phenytoin.     Indication: Treatment of seizures.    Prior Dosing Information: Home regimen dilantin 100 mg AM and 200 mg PM (per patient's mother): 9/11 PM and 9/12 AM doses held for supratherapeutic free phenytoin level.     Goals:  Therapeutic Drug Levels  ??? Total phenytoin level (in absence of free level): 10-20 mcg/mL  ??? Free phenytoin level: 1-2 mcg/mL.    Additional Clinical Monitoring Outcomes  Hepatic function, serum albumin, elevated BUN, seizure frequency/cessation    Results:   ??? Total phenytoin level: Not applicable  ??? Free phenytoin level: 2.15 mcg/mL, drawn ~21 hours after last dose  ??? Calculated Free Fraction: Not applicable     Pharmacokinetic Considerations and Significant Drug Interactions:   ??? Interacting medications: carbamazepine  ??? Enteral nutrition: no    Assessment/Plan:  Recommendation(s)  Free phenytoin level was slightly supratherapeutic after 2 doses held. Patient has not exhibited any signs of toxicity. Patient's most recent serum albumin was 1.1 on 9/12. After speaking with the primary team, will restart tonight on 100 mg BID.     Follow-up  ?? Level to be obtained: no level ordered given patient's most recent hemoglobin of 4.5.    ?? Continue monitoring signs/symptoms of worsening renal and/or hepatic function       Please page service pharmacist with questions/clarifications.    Hali Marry, PharmD  PGY1 Pharmacy Resident

## 2020-05-05 NOTE — Unmapped (Addendum)
[facility] Clinical Assessment & Refill Coordination Note    Per Clinic: Stopping Oxbryta at this time.  Patient will not need delivery to [facility] - delivery cancelled.  Procrit delivery scheduled to mother's address for 05/08/20.    [name] [name], DOB: 02/27/1971  Phone: [phone] (home) [phone] (work)    All above HIPAA information was verified with patient's family member, mother Everhett Bozard.     Was a Nurse, learning disability used for this call? No    Specialty Medication(s):   Hematology/Oncology: Gardenia Phlegm and Procrit     No current facility-administered medications for this visit.     No current outpatient medications on file.     Facility-Administered Medications Ordered in Other Visits   Medication Dose Route Frequency Provider Last Rate Last Admin   ??? acetaminophen (TYLENOL) tablet 500 mg  500 mg Oral Q4H PRN Judd Lien, MD       ??? aluminum-magnesium hydroxide-simethicone (MAALOX MAX) 80-80-8 mg/mL oral suspension  30 mL Oral Q4H PRN Judd Lien, MD       ??? carBAMazepine (TEGretol  XR) 12 hr tablet 400 mg  400 mg Oral Daily Judd Lien, MD   400 mg at 05/05/20 0912   ??? carBAMazepine (TEGretol  XR) 12 hr tablet 800 mg  800 mg Oral Nightly Judd Lien, MD   800 mg at 05/04/20 2211   ??? cholecalciferol (vitamin D3-10 mcg (400 unit)) tablet 10 mcg  10 mcg Oral Daily Judd Lien, MD   10 mcg at 05/05/20 0911   ??? diclofenac sodium (VOLTAREN) 1 % gel 2 g  2 g Topical Daily PRN Judd Lien, MD       ??? enoxaparin (LOVENOX) syringe 40 mg  40 mg Subcutaneous Q24H SCH Judd Lien, MD   40 mg at 05/05/20 0913   ??? epoetin alfa-EPBX (RETACRIT) 15,000 Units injection  15,000 Units Subcutaneous QAM AC Ivonne Andrew, MD   15,000 Units at 05/05/20 0800   ??? folic acid (FOLVITE) tablet 1,000 mcg  1,000 mcg Oral Daily Judd Lien, MD   1,000 mcg at 05/05/20 0911   ??? furosemide (LASIX) tablet 40 mg  40 mg Oral Daily Ivonne Andrew, MD   40 mg at 05/05/20 0911   ??? guaiFENesin (ROBITUSSIN) oral syrup  200 mg Oral Q4H PRN Judd Lien, MD       ??? HYDROmorphone (PF) (DILAUDID) injection 0.25 mg  0.25 mg Intravenous Q4H PRN Judd Lien, MD       ??? lactulose (CHRONULAC) oral solution (30 mL cup)  30 g Oral TID Mervin Kung, ANP       ??? melatonin tablet 3 mg  3 mg Oral Nightly PRN Judd Lien, MD   3 mg at 05/01/20 2304   ??? ondansetron (ZOFRAN-ODT) disintegrating tablet 4 mg  4 mg Oral Q8H PRN Judd Lien, MD        Or   ??? ondansetron (ZOFRAN) injection 4 mg  4 mg Intravenous Q8H PRN Judd Lien, MD       ??? phenytoin (DILANTIN) ER capsule 100 mg  100 mg Oral BID Benjaman Kindler, MD   100 mg at 05/05/20 0914   ??? polyethylene glycol (MIRALAX) packet 17 g  17 g Oral Daily Vonda Antigua, MD       ??? senna (SENOKOT) tablet 2 tablet  2 tablet Oral Nightly PRN Judd Lien, MD       ???  traMADoL (ULTRAM) tablet 50 mg  50 mg Oral Q4H PRN Judd Lien, MD   50 mg at 05/04/20 1610        Changes to medications: Monty reports no changes at this time.    Allergies   Allergen Reactions   ??? Ace Inhibitors      HYPERKALEMIA       Changes to allergies: No    SPECIALTY MEDICATION ADHERENCE     Oxbryta (voxelotor) 500 mg: 0 days of medicine on hand   Procrit 40,000 units: 0 days of medicine on hand     Medication Adherence    Patient reported X missed doses in the last month: >5  Specialty Medication: Voxelotor 500 mg  Patient is on additional specialty medications: Yes  Additional Specialty Medications: Procrit 40,000 units - 1mL twice weekly  Patient Reported Additional Medication X Missed Doses in the Last Month: >5  Patient is on more than two specialty medications: No  Informant: mother  Confirmed plan for next specialty medication refill: delivery by pharmacy          Specialty medication(s) dose(s) confirmed: Patient reports stopping medications; mother reports he should be continuing meds     Are there any concerns with adherence? Yes - provider and CPP have been notified previously    Adherence counseling provided? Previously counseled patient.  Now will contact mother going forward for clinical/refill calls and questions about medications    CLINICAL MANAGEMENT AND INTERVENTION      Clinical Benefit Assessment:    Do you feel the medicine is effective or helping your condition? Patient declined to answer    Clinical Benefit counseling provided? Not needed    Adverse Effects Assessment:    Are you experiencing any side effects? unknown. Currently inpatient    Are you experiencing difficulty administering your medicine? No    Quality of Life Assessment:    How many days over the past month did your Sickle cell disease  keep you from your normal activities? For example, brushing your teeth or getting up in the morning. currently inpatient    Have you discussed this with your provider? No - pharmacist will consult provider    Therapy Appropriateness:    Is therapy appropriate? Yes, therapy is appropriate and should be continued    DISEASE/MEDICATION-SPECIFIC INFORMATION      For patients on injectable medications: Patient currently has 0 doses left.  Next injection is scheduled for unsure - inpatient.    PATIENT SPECIFIC NEEDS     - Does the patient have any physical, cognitive, or cultural barriers? Yes - Per mother, patient has learning disability     - Is the patient high risk? No    - Does the patient require a Care Management Plan? No     - Does the patient require physician intervention or other additional services (i.e. nutrition, smoking cessation, social work)? No      SHIPPING     Specialty Medication(s) to be Shipped:   Hematology/Oncology: Gardenia Phlegm and Procrit    Other medication(s) to be shipped: syringes     Changes to insurance: No    Delivery Scheduled: Yes, Expected medication delivery date: 05/05/20.  However, Rx request for refills was sent to the provider as there are none remaining.     Medication will be delivered via [facility] - [facility] to the confirmed temporary address in [facility].    The patient will receive a drug information handout for each medication  shipped and additional FDA Medication Guides as required.  Verified that patient has previously received a Conservation officer, historic buildings.    All of the patient's questions and concerns have been addressed.    [name] [facility] Specialty Pharmacist

## 2020-05-05 NOTE — Unmapped (Shared)
Internal Medicine (MEDU) Progress Note    Assessment & Plan:   [name] [name] is a 49 y.o. male with sickle cell anemia c/b congestive hepatopathy, CNS disease (cognitive slowing and prior seizures), pHTN that presented to [facility] with acute on chronic worsening volume overload (belly distended and LE edema) as well as worsening DOE and weakness.      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 *        Volume Overload I Cholestatic Hepatopathy  Following hepatology rec's, awaiting MRCP. Confusion and asterixis worsens when not meeting goal BMs. Patient is alert and oriented x3 today and reports having 5 BMs overnight.  - no safe pocket for diagnostic paracentesis noted, may recheck in a few days  - 2gm sodium diet  - nutrition consulted  - continue lasix 40mg  daily   - trend INR (per heme rec's below, will do every other day)  - continue home lactulose, titrate to 2-5 BM daily  - MRCP for elevated bili if this will yield results that hepatology can act upon, given declining HGB and inability to transfuse patient  - outpatient EGD for EV screening  ??  Sickle Cell Anemia, Hgb SS   Following hematology rec's. Hematology states that patient can no longer be transfused due to the presence of multiple allo-antibodies HGB 4.5 yesterday  - continue folic acid and home voxelotor 500mg  daily (mother will bring tomorrow)  - supplement 10mg  PO Vitamin K x3 days  - every other day CBC, reticulocytes, LDH, CMP, D Bili, Haptoglobin - collect in pediatric tubes to minimize blood loss  - EPO 15,000 units daily until discharge then resume 40,000 daily  - enoxaparin ppx 40mg  daily ok  - As patient can no longer be transfused and HGB is declining, hold GOC conversation with patient and mother. Consider home hospice.    Oliguric AKI I Sickle Cell Nephropathy I RTA w/ Hyperkalemia I Intolerance of ACEi/ARB  Creatinine 0.73 (baseline) this morning. Potassium stable.   - strict I/O   - daily weights  - no spironolactone   - CMP every other day  ??  Generalized Weakness I Failure to Thrive  PT recommendation 5x/low. Will discuss possibility of SNF with mother and case manager tomorrow.  - PT/OT  ??    Chronic Problems:  Hx of seizures and cognitive slowing 2/2 SCD   Free phenytoin level elevated at 2.15 yesterday morning.  Pharmacy to dose phenytoin. Unable to check Carbamazepine level while Bili is so elevated - sample will be rejected. Pharmacy aware and will adjust as they feel necessary.   - free phenytoin level pending - pharmacy to dose   - unable to check Carbamazepine level until Bili drops - pharmacy to recommend dosing changes if necessary        Daily Checklist:  Diet: Regular Diet  DVT  PPx: Lovenox 40mg  q24h  Electrolytes: Replete Potassium to >/=4 and Magnesium to >/=2  Code Status: Full Code  Dispo: continue floor care    Team Contact Information:   Primary Team: Internal Medicine (MEDU)  Primary Resident: Marjorie Smolder, NP student  Resident's Pager: [phone] (Gen MedU Intern - [place] Asters)    Interval History:   No acute events overnight. Reports difficulty sleeping due to frequent bowel movements    Objective:   Temp:  [35.7 ??C (96.2 ??F)-36.4 ??C (97.5 ??F)] 36 ??C (96.8 ??F)  Heart Rate:  [76-84] 81  Resp:  [16-18] 18  BP: (111-118)/(57-70) 118/58  SpO2:  [90 %] 90 %    Gen: chronically ill appearing M in NAD  Eyes: scleral icterus present  HENT: atraumatic  Heart: RRR, S1, S2  Lungs: CTAB on anterior exam, no crackles or wheezes, no use of accessory muscles  Abdomen: Hyperactive bowel sounds, moderately firm, mildly tender to palpation, no rebound/guarding  Psych: Alerts and oriented x3, mood and affect appropriate  Neuro: face symmetric. Asterixis absent today    Labs/Studies: Labs and Studies from the last 24hrs per EMR and Reviewed      [name] Pea, MD   Neurology, PGY-1

## 2020-05-06 LAB — CBC
HEMATOCRIT: 12.6 % — CL (ref 41.0–53.0)
HEMOGLOBIN: 4.1 g/dL — CL (ref 13.5–17.5)
MEAN CORPUSCULAR HEMOGLOBIN CONC: 32.2 g/dL (ref 31.0–37.0)
MEAN CORPUSCULAR VOLUME: 121.7 fL — ABNORMAL HIGH (ref 80.0–100.0)
MEAN PLATELET VOLUME: 9.3 fL (ref 7.0–10.0)
NUCLEATED RED BLOOD CELLS: 34 /100{WBCs} — ABNORMAL HIGH (ref <=4)
RED BLOOD CELL COUNT: 1.04 10*12/L — ABNORMAL LOW (ref 4.50–5.90)
RED CELL DISTRIBUTION WIDTH: 23.9 % — ABNORMAL HIGH (ref 12.0–15.0)
WBC ADJUSTED: 10.1 10*9/L (ref 4.5–11.0)

## 2020-05-06 LAB — RETICULOCYTES: RETIC COUNT, MANUAL: 21.9 % — ABNORMAL HIGH (ref 0.8–2.0)

## 2020-05-06 LAB — COMPREHENSIVE METABOLIC PANEL
ALBUMIN: 1.1 g/dL — ABNORMAL LOW (ref 3.4–5.0)
ALKALINE PHOSPHATASE: 232 U/L — ABNORMAL HIGH (ref 46–116)
AST (SGOT): 96 U/L — ABNORMAL HIGH (ref <=34)
BILIRUBIN TOTAL: 42.5 mg/dL (ref 0.3–1.2)
CHLORIDE: 103 mmol/L (ref 98–107)
CREATININE: 1.23 mg/dL — ABNORMAL HIGH
EGFR CKD-EPI AA MALE: 80 mL/min/{1.73_m2} (ref >=60)
POTASSIUM: 5.8 mmol/L — ABNORMAL HIGH (ref 3.5–5.1)
SODIUM: 131 mmol/L — ABNORMAL LOW (ref 135–145)

## 2020-05-06 LAB — PROTIME-INR: PROTIME: 25.9 s — ABNORMAL HIGH (ref 10.5–13.5)

## 2020-05-06 LAB — RETICULOCYTE ABSOLUTE COUNT, MANUAL: Lab: 234.3 — ABNORMAL HIGH

## 2020-05-06 LAB — PROTIME: Coagulation tissue factor induced:Time:Pt:PPP:Qn:Coag: 25.9 — ABNORMAL HIGH

## 2020-05-06 LAB — PLATELET COUNT: Platelets:NCnc:Pt:Bld:Qn:Automated count: 90 — ABNORMAL LOW

## 2020-05-06 LAB — BLOOD UREA NITROGEN: Urea nitrogen:MCnc:Pt:Ser/Plas:Qn:: 0

## 2020-05-06 LAB — BILIRUBIN DIRECT: Bilirubin.glucuronidated+Bilirubin.albumin bound:MCnc:Pt:Ser/Plas:Qn:: >22.5 — ABNORMAL HIGH

## 2020-05-06 LAB — PHENYTOIN FREE: Phenytoin.free:MCnc:Pt:Ser/Plas:Qn:: 2.2 — ABNORMAL HIGH

## 2020-05-06 MED ADMIN — calcium chloride 100 mg/mL (10 %) syringe 1 g: 1 g | INTRAVENOUS | @ 15:00:00 | Stop: 2020-05-06

## 2020-05-06 MED ADMIN — ondansetron (ZOFRAN) injection 4 mg: 4 mg | INTRAVENOUS | @ 08:00:00 | Stop: 2020-05-06

## 2020-05-06 MED ADMIN — enoxaparin (LOVENOX) syringe 40 mg: 40 mg | SUBCUTANEOUS | @ 12:00:00 | Stop: 2020-05-06

## 2020-05-06 MED ADMIN — sodium bicarbonate injection 50 mEq: 50 meq | INTRAVENOUS | @ 15:00:00 | Stop: 2020-05-06

## 2020-05-06 MED ADMIN — cholecalciferol (vitamin D3-10 mcg (400 unit)) tablet 10 mcg: 10 ug | ORAL | @ 12:00:00 | Stop: 2020-05-06

## 2020-05-06 MED ADMIN — folic acid (FOLVITE) tablet 1,000 mcg: 1000 ug | ORAL | @ 12:00:00 | Stop: 2020-05-06

## 2020-05-06 MED ADMIN — cefepime (MAXIPIME) 2 g in dextrose 100 mL IVPB (premix): 2 g | INTRAVENOUS | @ 16:00:00 | Stop: 2020-05-06

## 2020-05-06 MED ADMIN — vancomycin (VANCOCIN) 1250 mg in sodium chloride (NS) 0.9 % 250 mL IVPB (premix): 1250 mg | INTRAVENOUS | @ 16:00:00 | Stop: 2020-05-06

## 2020-05-06 MED ADMIN — dextrose 50 % in water (D50W) 50 % solution 50 mL: 50 mL | INTRAVENOUS | @ 15:00:00 | Stop: 2020-05-06

## 2020-05-06 MED ADMIN — lactulose (CHRONULAC) oral solution (30 mL cup): 30 g | ORAL | @ 11:00:00 | Stop: 2020-05-06

## 2020-05-06 MED ADMIN — furosemide (LASIX) tablet 40 mg: 40 mg | ORAL | @ 12:00:00 | Stop: 2020-05-06

## 2020-05-06 MED ADMIN — aluminum-magnesium hydroxide-simethicone (MAALOX MAX) 80-80-8 mg/mL oral suspension: 30 mL | ORAL | @ 12:00:00 | Stop: 2020-05-06

## 2020-05-06 MED ADMIN — HYDROmorphone (injection) injection 1 mg: 1 mg | INTRAVENOUS | @ 22:00:00 | Stop: 2020-05-20

## 2020-05-06 NOTE — Unmapped (Shared)
Internal Medicine (MEDU) Progress Note    Assessment & Plan:   [name] [name] is a 49 y.o. male with sickle cell anemia c/b congestive hepatopathy, CNS disease (cognitive slowing and prior seizures), pHTN that presented to [facility] with acute on chronic worsening volume overload (belly distended and LE edema) as well as worsening DOE and weakness.      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 *    Sickle Cell Anemia, Hgb SS   Patient's condition continues to deteriorate today.  Rapid response was called after patient was noted to be hypothermic, hypoglycemic, hyperkalemic with K of 5.8 and ECG changes noted, acidotic with pH of 7.14, lactate 10.2 and with altered mental status this morning.  HGB 4.1 this AM.  Per hematology, patient cannot be transfused due to multiple alloantibodies.  Acute complications were treated with calcium chloride, D50 and sodium bicarbonate to allow time for family to arrive for GOC discussion.   GOC discussion held with patient's family and decision was made to transition patient to comfort care, as his course represents the end stage of his disease.  - discontinue labs and monitoring  - transition to comfort care  - hydromorphone for pain PRN  - lorazepam for anxiety PRN  - haloperidol for agitation PRN    Volume Overload I Cholestatic Hepatopathy  Patient's bilirubin continues to rise, 42.5 today.  Patient was noted to be hypoglycemic this morning with blood glucose of 30.  GOC conference held with family, and patient was transitioned to comfort care.   - transition to comfort care  - discontinue labs  ????    Chronic Problems:  Hx of seizures and cognitive slowing 2/2 SCD   Free phenytoin level elevated at 2.15 yesterday morning.  Pharmacy to dose phenytoin. Unable to check Carbamazepine level while Bili is so elevated - sample will be rejected. Pharmacy aware and will adjust as they feel necessary.   - free phenytoin level pending - pharmacy to dose   - unable to check Carbamazepine level until Bili drops - pharmacy to recommend dosing changes if necessary        Daily Checklist:  Diet: Regular Diet  DVT PPx: Contraindicated 2/2 comfort care only  Electrolytes: No Repletion Needed  Code Status: DNR and DNI  Dispo: continue floor care    Team Contact Information:   Primary Team: Internal Medicine (MEDU)  Primary Resident: Marjorie Smolder, NP student  Resident's Pager: [phone] (Gen MedU Intern - Cliffton Asters)    Interval History:   This morning, rapid response was called when patient was found to be hypothermic with altered mental status.  He was noted to be hypoglycemic, hyperkalemic and acidotic.  GOC discussion with family was held and patient was transitioned to comfort care.    Objective:   Temp:  [33.5 ??C (92.3 ??F)-36.2 ??  C (97.1 ??F)] 34.4 ??C (93.9 ??F)  Heart Rate:  [53-78] 73  SpO2 Pulse:  [52-73] 73  Resp:  [18-20] 20  BP: (91-158)/(10-78) 93/34  FiO2 (%):  [50 %] 50 %  SpO2:  [89 %-98 %] 93 %    Gen: chronically ill appearing, somnolent  Eyes: scleral icterus present  HENT: atraumatic   Heart: RRR, S1, S2, systolic murmur heard in mitral area, clicking murmur heard at apex  Lungs: CTAB on anterior exam but diminished, no crackles or wheezes, no use of accessory muscles  Abdomen: hypoactive bowel sounds, distended, firm  Psych: somnolent but arouses to touch, makes eye contact with examiner and speaks in short phrases  Neuro: face symmetric    Labs/Studies: Labs and Studies from the last 24hrs per EMR and Reviewed

## 2020-05-06 NOTE — Unmapped (Signed)
Internal Medicine (MEDU) Progress Note    Assessment & Plan:   [name] [name] is a 49 y.o. male with sickle cell anemia c/b congestive hepatopathy, CNS disease (cognitive slowing and prior seizures), pHTN that presented to [facility] with several weeks of decompensation and was found to have significant anemia below his baseline as well as hepatic encephalopathy and liver dysfunction likely due to his sickle cell disease.      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 *      Volume Overload I Cholestatic Hepatopathy I Hepatic Encephalopathy  Being evaluated by hepatology consult team - awaiting results of MRCP to see if there is any cause of his liver dysfunction that we can intervene upon. Most likely this is due to his sickle cell disease. Confusion waxes and wanes depending on number of bowel movements patient has. Mental status improved today following 6 BMs overnight. Low concern for SBP and unable to find suitable fluid pocket for diagnostic paracentesis.   - if able, diagnostic para with total protein, albumin, cell count/diff, culture - VIR?  - 2gm sodium diet  - continue lasix 40mg  daily   - trend INR (per heme rec's below, will do every other day)  - continue home lactulose, titrate to 2-5 BM daily  - MRCP for elevated bili  ??  Sickle Cell Anemia, Hgb SS   Unfortunately, most of patients current issues likely due to sickle cell disease. Per hematology, patient is unable to have blood transfusions given large number of allo-antibodies and unlikely to benefit from any further treatments. Began discussion of possibility of hospice care with patient and his mother today. Plans for family meeting tomorrow at bedside. Still do not have home supply of Voxelotor, but my understanding is it is unlikely to provide benefit. His mother has asked [place] to please try it - pending decision from hematology.   - continue folic acid +/- home voxelotor (do not have in hospital) 500mg  daily   - supplement 10mg  PO Vitamin K x3 days  - every other day CBC, reticulocytes, LDH, CMP, D Bili, Haptoglobin - collect in pediatric tubes to minimize blood loss  - EPO 15,000 units daily until discharge then resume 40,000 daily  - enoxaparin ppx 40mg  daily ok  - DO NOT TRANSFUSE BLOOD    Oliguric AKI, resolving I Sickle Cell Nephropathy I RTA w/ Hyperkalemia I Intolerance of ACEi/ARB  Creatinine at baseline yesterday and potassium stable. Will recheck tomorrow.  - strict I/O   - daily weights  - no spironolactone   - CMP every other day  ??  Generalized Weakness I Failure to Thrive  PT recommendation 5x/low. Will discuss possibility of SNF vs home +/- hospice at family meeting tomorrow.  - PT/OT  - Palliative vs Hospice?  ??    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 Dosing adjusted by pharmacy. Unable to get reliable Carbamazepine level due to elevated bili. Will monitor for clinical evidence of toxicity and adjust as necessary.   - Phenytoin and Carbamazepine per pharmacy rec's      Daily Checklist:  Diet: Regular Diet  DVT PPx: Lovenox 40mg  q24h  Electrolytes: Replete Potassium to >/=4 and Magnesium to >/=2  Code Status: Full Code  Dispo: continue floor care    Team Contact Information:   Primary Team: Internal Medicine (MEDU)  Primary Resident: Ivonne Andrew, MD  Resident's Pager: [phone] (Gen MedU Intern - Cliffton Asters)    Interval History:   No acute events overnight. Some abdominal pain, frustrated by multiple bowel movements and interruptions for vital signs. Discussed hematology rec's to not transfuse and the idea of focusing on comfort. Called mother at bedside per patient's request and discussed with her as well. She is planning to come in tomorrow to continue goals of care conversation.     Objective:   Temp:  [35.7 ??C-36.4 ??C] 36 ??C  Heart Rate:  [76-84] 81  Resp:  [16-18] 18  BP: (111-118)/(57-70) 118/58  SpO2:  [90 %] 90 %    Gen: chronically ill appearing M in NAD  Eyes: scleral icterus present  HENT: atraumatic, dry cracked lips  Heart: RRR, S1, S2, blowing systolic murmur heard at R&L sternal borders. High pitched systolic murmur present at apex  Lungs: CTAB on anterior exam, no crackles or wheezes, no use of accessory muscles  Abdomen: Normoactive bowel sounds, moderately firm, mildly tender to palpation, no rebound/guarding  Neuro: A&Ox4, face symmetric. Normal speech    Labs/Studies: Labs and Studies from the last 24hrs per EMR and Reviewed      Alwyn Pea, MD   Neurology, PGY-1

## 2020-05-06 NOTE — Unmapped (Signed)
HEPATOLOGY INPATIENT TREATMENT PLAN NOTE     Interval History:     - bili rising, Hgb downtrending  - hypothermic, altered. Transferred to ICU    Assessment:    49 y.o. 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 Also rising bilirubin.   ??  Suspect his chronic liver disease is a combination of congestive hepatopathy due to sickle cell hepatopathy and pulmonary HTN. Marland Kitchen He recently had R heart cath that showed only mild pulm HTN and RA pressure of 8 so worsening congestion from pulmonary HTN unlikely to be contributing. Given sickle cell driving his liver disease hematology was consulted. Per them he unfortunately does not have any further options for sickle cell therapy and has many antibodies making red blood cell transfusion not possible. As a result hospice/palliative care had been considered/ discussed by his outpatient hematologist prior to this admission.     There were plans for goals of care discussions today with primary team and hematology team given his worsening clinical status and limited options. However prior to this meeting he had further decompensation and was transferred to ICU.     MRCP showed no reversible etiologies of his worsening hyperbilirubinemia. No evidence of bile duct dilation or choledocholithiasis.     We share the concern that he is clinically worsening and does not have any options for further treatment to reverse this from a hepatology standpoint.     We agree with goals of care discussions and possible palliative care consult.     Thank you for this consult. The patient was Discussed with  Dr. Ruffin Frederick. We will sign off at this time. Please page hepatology fellow on call with questions.     Hyman Bible, MD  Gastroenterology & Hepatology Fellow, PGY-5  [facility]

## 2020-05-06 NOTE — Unmapped (Signed)
VENOUS ACCESS ULTRASOUND PROCEDURE NOTE    Indications:   Poor venous access.    The Venous Access Team has assessed this patient for the placement of a PIV. Ultrasound guidance was necessary to obtain access.     Procedure Details:  Identity of the patient was confirmed via name, medical record number and date of birth. The availability of the correct equipment was verified.    The vein was identified for ultrasound catheter insertion.  Field was prepared with necessary supplies and equipment.  Probe cover and sterile gel utilized.  Insertion site was prepped with chlorhexidine solution and allowed to dry.  The catheter extension was primed with normal saline.A(n) 20 g x 2.25 inch (this is a 29 day dwell, power injectable) catheter was placed in the right upper arm with 1 attempt(s). See LDA for additional details.    Catheter aspirated, 5 mL blood return present. The catheter was then flushed with 10 mL of normal saline. Insertion site cleansed, and dressing applied per manufacturer guidelines. The catheter was inserted without difficulty  by Thelma Barge RN.      RN was notified.     Thank you,     Thelma Barge RN Venous Access Team   [phone]     Workup / Procedure Time:  30 minutes    See vein image(s) in PACs.

## 2020-05-06 NOTE — Unmapped (Incomplete)
Physician Discharge Summary [facility]  BICU [facility]  [address]  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 *      Outpatient Provider Follow Up Issues:   ***    Hospital Course:   Arville Lime ***is a 49 y.o. male with sickle cell anemia c/b congestive hepatopathy, CNS disease??(cognitive??slowing??and??prior??seizures),??pHTN??that presented to [facility]??with several weeks of decompensation and was found to have significant anemia below his baseline as well as hepatic encephalopathy and liver dysfunction likely due to his sickle cell disease.??     Over the course of his hospitalization his hemoglobin continued to drop despite restarting EPO per hematology's recommendation. Blood transfusion options were discussed by hematology, but due to presence of antibodies he was at high risk for hyper-hemolysis.     A work-up of his liver dysfunction was initiated by hepatology and there was not found to be an intervenable cause for his liver dysfunction. Most likely cause was sequela of his sickle cell anemia.     On 9/14 patient was found to have an acute mental status change as well as hypothermia and hypoglycemia. Sepsis vs fulminant liver failure were considered. The risks of treating for sepsis with fluids was weighed against the risk of causing further dilution and worsening his already severe anemia. Moreover he was found to have a severe lactic acidosis which could only be potentially corrected with fluids as well as a new acute hyperkalemia with EKG changes which would have required serial blood draws and fluids for correction. These problems were presented to family during a goals of care discussions and a decision was made to focus on comfort and to give family to to part with the patient.             Procedures:  No admission procedures for hospital encounter.  ______________________________________________________________________  Discharge Medications:     Your Medication List      ASK your doctor about these medications    BD TUBERCULIN SYRINGE 1 mL 27 x 1/2 Syrg  Generic drug: syringe with needle  Use as directed twice weekly     carBAMazepine 400 MG 12 hr  tablet  Commonly known as: TEGretol  XR  Take 1 tablet in the morning and 2 tablets in the evening     diclofenac sodium 1 % gel  Commonly known as: VOLTAREN  Apply 2 g topically daily as needed for arthritis.     empty container Misc  Commonly known as: sharps container  use as directed     folic acid 1 MG tablet  Commonly known as: FOLVITE  TAKE 1 TABLET BY MOUTH EVERY DAY     furosemide 20 MG tablet  Commonly known as: LASIX  TAKE 2 TABLETS BY MOUTH EVERY DAY     lactulose 10 gram/15 mL solution  Commonly known as: CONSTULOSE  Take 45 mL (30 g total) by mouth Three (3) times a day.     oxyCODONE 5 MG immediate release tablet  Commonly known as: ROXICODONE  Take 1 tablet (5 mg total) by mouth every six (6) hours as needed for pain.     OXYGEN-AIR DELIVERY SYSTEMS MISC  2 L/min by Each Nare route nightly. [employer]     phenytoin 100 MG ER capsule  Commonly known as: DILANTIN  Take 1 capsule in the morning and 2 capsules in the evening     PROCRIT 40,000 unit/mL injection  Generic drug: epoetin alfa  Inject 1 mL (40,000 Units total) under the skin Two (2) times a week.     sodium polystyrene 15 gram/60 mL Susp  Generic drug: sodium polystyrene sulfonate  Take 15 g by mouth daily.     VITAMIN D3-10 mcg (400 unit) 10 mcg (400 unit) Cap  Generic drug: cholecalciferol (vitamin D3-10 mcg (400 unit))  Take 1 capsule (400 Units total) by mouth daily.     voxelotor 500 mg tablet  Commonly known as: OXBRYTA  Take 1 tablet (500 mg total) by mouth daily.            Allergies:  Ace inhibitors  ______________________________________________________________________  Pending Test Results (if blank, then none):  Pending Labs     Order Current Status    Zinc Level, Serum In process          Most Recent Labs:  All lab results last 24 hours -   Recent Results (from the past 24 hour(s))   Phenytoin Level, Free    Collection Time: 04/26/2020  4:46 AM   Result Value Ref Range    Phenytoin, Free 2.20 (H) 1 - 2 ug/mL   PT-INR    Collection Time: 04/25/2020  6:44 AM   Result Value Ref Range    PT 25.9 (H) 10.5 - 13.5 sec    INR 2.26    Bilirubin, Direct    Collection Time: 05/01/2020  6:44 AM   Result Value Ref Range    Bilirubin, Direct >22.50 (H) 0.00 - 0.30 mg/dL   CBC    Collection Time: 05/19/2020  6:44 AM   Result Value Ref Range    WBC 10.1 4.5 - 11.0 10*9/L    RBC 1.04 (L) 4.50 - 5.90 10*12/L    HGB 4.1 (LL) 13.5 - 17.5 g/dL    HCT 16.1 (LL) 09.6 - 53.0 %    MCV 121.7 (H) 80.0 - 100.0 fL    MCH 38.2 (H) 26.0 - 34.0 pg    MCHC 32.2 31.0 - 37.0 g/dL    RDW 04.5 (H) 40.9 - 15.0 %    MPV 9.3 7.0 - 10.0 fL    Platelet 90 (L) 150 - 440 10*9/L  nRBC 34 (H) <=4 /100 WBCs    Results Verified by Slide Scan Slide Reviewed    Comprehensive Metabolic Panel    Collection Time: 03-Jun-2020  6:44 AM   Result Value Ref Range    Sodium 131 (L) 135 - 145 mmol/L    Potassium 5.8 (H) 3.5 - 5.1 mmol/L    Chloride 103 98 - 107 mmol/L    Anion Gap      CO2      BUN      Creatinine 1.23 (H) 0.60 - 1.10 mg/dL    EGFR CKD-EPI Non-African American, Male 73 >=60 mL/min/1.24m2    EGFR CKD-EPI African American, Male 49 >=60 mL/min/1.34m2    Glucose      Calcium      Albumin 1.1 (L) 3.4 - 5.0 g/dL    Total Protein      Total Bilirubin 42.5 (HH) 0.3 - 1.2 mg/dL    AST 96 (H) <=16 U/L    ALT 25 10 - 49 U/L    Alkaline Phosphatase 232 (H) 46 - 116 U/L   Reticulocytes    Collection Time: 2020-06-03  6:44 AM   Result Value Ref Range    Reticulocyte Manual % 21.9 (H) 0.8 - 2.0 %    Absolute Manual Reticulocyte 234.3 (H) 27.0 - 120.0 10*9/L   POCT Glucose    Collection Time: 2020/06/03 10:44 AM   Result Value Ref Range    Glucose, POC 30 (LL) 70 - 179 mg/dL   ECG 12 Lead    Collection Time: June 03, 2020 10:51 AM   Result Value Ref Range    EKG Systolic BP  mmHg    EKG Diastolic BP  mmHg    EKG Ventricular Rate 60 BPM    EKG Atrial Rate  BPM    EKG P-R Interval  ms    EKG QRS Duration 250 ms    EKG Q-T Interval 596 ms    EKG QTC Calculation 596 ms    EKG Calculated P Axis  degrees    EKG Calculated R Axis -56 degrees    EKG Calculated T Axis 95 degrees    QTC Fredericia 596 ms   POCT Glucose    Collection Time: June 03, 2020 10:57 AM   Result Value Ref Range    Glucose, POC 150 70 - 179 mg/dL   Carboxyhemoglobin/POC    Collection Time: 03-Jun-2020 11:09 AM   Result Value Ref Range    Carboxyhemoglobin 5.5 (H) <1.2 %   GLUCOSE-BG/POC    Collection Time: 06-03-2020 11:09 AM   Result Value Ref Range    Glucose Whole Blood 150 70 - 179 mg/dL   POCT Arterial Blood Gas    Collection Time: 06-03-20 11:09 AM   Result Value Ref Range    pH, Arterial 7.14 (LL) 7.35 - 7.45    pCO2, Arterial 27 (L) 35 - 45 mm[Hg]    pO2, Arterial 144 (H) 80 - 110 mm[Hg] HCO3 (Bicarbonate), Arterial 9.3 (L) 22.0 - 27.0 mmol/L    Base Excess, Arterial -19.8 (L) -2.0 - 2.0 mmol/L    O2 Sat, Arterial 99.0 94.0 - 100.0 %    Specimen Type, Arterial Arterial     FIO2 50 %   HEMOGLOBIN BG/POC    Collection Time: 03-Jun-2020 11:09 AM   Result Value Ref Range    Hemoglobin <5.0 (LL) 13.5 - 17.5 g/dL   POCT Ionized Calcium    Collection Time: 06-03-20 11:09 AM   Result Value Ref Range  Ionized Calcium 6.0 (H) 4.4 - 5.4 mg/dL   POTASSIUM-BG/POC    Collection Time: 27-May-2020 11:09 AM   Result Value Ref Range    Potassium, Bld 5.5 (H) 3.4 - 4.6 mmol/L   POCT Lactic Acid (Lactate), Arterial    Collection Time: May 27, 2020 11:09 AM   Result Value Ref Range    Lactate, Arterial 10.5 (HH) <1.3 mmol/L   METHEMOGLOBIN/POC    Collection Time: 05-27-20 11:09 AM   Result Value Ref Range    Methemoglobin <1.0 <1.5 %   SODIUM-BG/POC    Collection Time: 27-May-2020 11:09 AM   Result Value Ref Range    Sodium Whole Blood 135 135 - 145 mmol/L   POCT Arterial Oxyhemoglobin    Collection Time: May 27, 2020 11:09 AM   Result Value Ref Range    Oxyhemoglobin 92.6 (L) 94.0 - 100.0 %   ECG 12 Lead    Collection Time: 2020-05-27 11:45 AM   Result Value Ref Range    EKG Systolic BP  mmHg    EKG Diastolic BP  mmHg    EKG Ventricular Rate 68 BPM    EKG Atrial Rate 68 BPM    EKG P-R Interval 360 ms    EKG QRS Duration 212 ms    EKG Q-T Interval 530 ms    EKG QTC Calculation 563 ms    EKG Calculated P Axis -135 degrees    EKG Calculated R Axis -29 degrees    EKG Calculated T Axis 100 degrees    QTC Fredericia 552 ms       Relevant Studies/Radiology (if blank, then none):  ECG 12 Lead    Result Date: May 27, 2020  UNUSUAL P AXIS, POSSIBLE ECTOPIC ATRIAL RHYTHM INTRAVENTRICULAR CONDUCTION DELAY MINIMAL VOLTAGE CRITERIA FOR LVH, MAY BE NORMAL VARIANT ( [employer] product ) ABNORMAL ECG WHEN COMPARED WITH ECG OF 05-27-20 10:51, ECTOPIC ATRIAL RHYTHM HAS REPLACED WIDE QRS RHYTHM    ECG 12 Lead    Result Date: 2020-05-27  WIDE QRS RHYTHM RIGHT BUNDLE BRANCH BLOCK LEFT ANTERIOR FASCICULAR BLOCK ***BIFASCICULAR BLOCK*** T WAVE ABNORMALITY, CONSIDER LATERAL ISCHEMIA ABNORMAL ECG WHEN COMPARED WITH ECG OF 01-May-2020 12:44, WIDE QRS RHYTHM HAS REPLACED SINUS RHYTHM    ECG 12 Lead    Result Date: 05/21/2020  SINUS RHYTHM LEFT VENTRICULAR HYPERTROPHY WITH QRS WIDENING ABNORMAL ECG WHEN COMPARED WITH ECG OF 07-Apr-2020 12:03, QRS DURATION HAS INCREASED Confirmed by Huel Coventry ([id number]) on 04/23/2020 11:08:47 PM    XR Chest Portable    Result Date: May 27, 2020  EXAM: XR CHEST PORTABLE DATE: 05/27/20 12:10 PM ACCESSION: [id number] UN DICTATED: 27-May-2020 1:18 PM INTERPRETATION LOCATION: [facility] CLINICAL INDICATION: 49 years old Male with ALTERED MENTAL STATUS  COMPARISON: 04/25/2020 TECHNIQUE: Portable Chest Radiograph. FINDINGS: Low lung volumes. Increased retrocardiac opacity. Increased linear bibasilar opacities. Stable left upper mediastinal opacity, compatible with soft tissue mass seen on prior CTA chest. No pleural effusion or pneumothorax. Stable cardiomediastinal silhouette. Unchanged chronic right distal clavicular fracture.     Low lung volumes with increased bibasilar and retrocardiac opacities, may represent atelectasis versus infection/aspiration in the appropriate clinical context.    XR Chest 2 views    Result Date: 04/30/2020  EXAM: XR CHEST 2 VIEWS DATE: 05/09/2020 12:30 PM ACCESSION: [id number] UN DICTATED: 05/12/2020 1:23 PM INTERPRETATION LOCATION: [facility] CLINICAL INDICATION: 49 years old Male with DYSPNEA ; Dyspnea  COMPARISON: 01/25/2020, CTA chest 01/26/2020 TECHNIQUE: PA and Lateral Chest Radiographs. FINDINGS: Stable retrocardiac opacity. Increased linear right perihilar opacity, likely atelectasis. Similar nodular opacity in the  left superior mediastinum, likely soft tissue mass seen on prior CTA chest. No pleural effusion or pneumothorax. Unremarkable cardiomediastinal silhouette. Chronic distal right clavicular fracture with callus formation. Chronic right rib fracture.     Stable retrocardiac heterogeneous opacity, may represent atelectasis versus infection. Similar left superior mediastinal soft tissue nodular opacity, likely soft tissue mass seen on prior CTA chest.    MRI Abdomen Wo Contrast MRCP    Result Date: 05/03/2020  EXAM: MRI abdomen without contrast, MRCP DATE: 05/05/2020 11:00 PM ACCESSION: [id number] UN DICTATED: 05/05/2020 11:51 PM INTERPRETATION LOCATION: [facility] CLINICAL INDICATION: 49 year old Male with elevated bilirubin  COMPARISON: Liver Doppler ultrasound 05/02/2020, CTA chest 01/26/2020 TECHNIQUE: MRI of the abdomen was obtained without IV contrast. Multisequence, multiplanar images and dedicated T2 weighted images that highlight the biliary tree were obtained. FINDINGS: Evaluation of the intra-abdominal organs and vascular structures limited in the absence of IV contrast. Study is also markedly degraded due to motion artifact and  ascites. LINES/DEVICES: None. LOWER CHEST: Small left pleural effusion with adjacent atelectasis. ABDOMEN: HEPATOBILIARY: Nodular hepatic contour. Gallbladder moderately distended with several small stones at the fundus. No significant gallbladder wall thickening. Mild extrahepatic biliary ductal dilatation, measuring up to 0.7 cm. PANCREAS: Unremarkable. SPLEEN: Susceptibility artifact from dense splenic calcifications, similar to prior. ADRENAL GLANDS: Unremarkable. KIDNEYS/URETERS: Horseshoe kidney. BOWEL/PERITONEUM/RETROPERITONEUM: No bowel obstruction. No acute inflammatory process. Small-volume abdominopelvic ascites. VASCULATURE: Abdominal aorta within normal limits for patient's age. Unremarkable inferior vena cava. LYMPH NODES: No adenopathy. BONES/SOFT TISSUES: Unremarkable.     --Degraded examination due to lack of intravenous contrast and motion. Moderate ascites also limits this examination. --Moderately distended gallbladder, concentrated bile/sludge and multiple subcentimeter gravel-type biliary stones. No definitive choledocholithiasis or bile duct dilatation, limited by factors detailed above.  --Morphologic changes of chronic liver disease. Stigmata of portal hypertension including moderate volume abdominal ascites. --Additional chronic and incidental findings as noted above.    US Liver Doppler    Result Date: 05/02/2020  EXAM: US LIVER DOPPLER DATE: 05/02/2020 5:09 PM ACCESSION: [id number] UN DICTATED: 05/02/2020 5:16 PM INTERPRETATION LOCATION: [facility] CLINICAL INDICATION: 49 years old Male with worsening LFTs, ascites  TECHNIQUE: Ultrasound views of the complete abdomen were obtained using gray scale and color and spectral Doppler imaging. COMPARISON: 01/11/2020 FINDINGS: LIVER: The liver is enlarged and mildly heterogeneous in echotexture. The hepatic contour is nodular, compatible with chronic liver disease. No discrete focal hepatic lesions. No intrahepatic biliary ductal dilatation. The common bile duct is normal in caliber. GALLBLADDER: The gallbladder is not well visualized. The suspected gallbladder demonstrates wall thickening with internal sludge, however, again this is poorly evaluated. PANCREAS: Visualized portion is unremarkable. SPLEEN: Not visualized. KIDNEYS: Horseshoe kidney identified. The right moiety demonstrates a subcentimeter cyst. No solid masses or calculi. No hydronephrosis. VESSELS - Portal vein: The main, left and right portal veins are patent with hepatopetal flow. Normal main portal vein velocity (0.20 m/s or greater) - Splenic vein: Patent, with hepatopetal flow. - Hepatic veins/IVC: The IVC, left, middle and right hepatic veins are patent with bi/triphasic waveforms. - Hepatic artery: Patent - Visualized proximal aorta:  unremarkable OTHER: Small-volume ascites.     1. Patent hepatic vasculature with normal flow direction. 2. Chronic liver disease with findings of cirrhosis and small volume ascites. 3. Horseshoe kidney. 4. Poorly evaluated gallbladder with suggestion of wall thickening, likely secondary to underlying hepatic dysfunction. Please see below for data measurements: Liver: 21.8 cm Common bile duct: 0.37 cm Gallbladder wall: 0.65 cm Sonographic Murphy's Sign: negative Pericholecystic  fluid visualized: no Main portal vein diameter: 1.2 cm Main portal vein velocity: 0.26 m/s Anterior right portal vein velocity: 0.24 m/s Posterior right portal vein velocity: 0.17 m/s Left portal vein velocity: 0.15 m/s Main portal vein flow: hepatopetal Right portal vein flow: hepatopetal Left portal vein flow: hepatopetal Common hepatic artery: Patent Left hepatic vein flow: biphasic Middle hepatic vein flow: biphasic Right hepatic vein flow: biphasic Inferior vena cava flow: triphasic Splenic vein midline: hepatopetal Splenic vein proximal: Not well-visualized Aorta: partially visualized Inferior vena cava: Partially visualized Abdominal free fluid visualized: yes     ______________________________________________________________________  Discharge Instructions:                        Appointments which have been scheduled for you    Jun 12, 2020  3:00 PM  (Arrive by 2:45 PM)  RETURN VIDEO - OTHER with Julious Oka, MD  [facility] [place] [facility] REG[facility]) [address]  [place] [address]  [phone]      Jun 13, 2020 10:30 AM  (Arrive by 10:15 AM)  RETURN  PULM HYPERTENSION with Zannie Cove, MD  [facility] PULMONARY SPECIALTY CL [place] [facility] REG[facility]) [address]  [place] [address]  [phone]           ______________________________________________________________________  Discharge Day Services:  BP 86/38  - Pulse 66  - Temp 37.2 ??C  - Resp 20  - Ht 167.6 cm (5' 5.98)  - Wt 71.4 kg (157 lb 8 oz)  - SpO2 90%  - BMI 25.43 kg/m??   {LM D/C Day Services:57784::Pt seen on the day of discharge and determined appropriate for discharge.}    Condition at Discharge: {condition:18240}    Length of Discharge: I spent {discharge 45mins:23311} than 30 mins in the discharge of this patient.

## 2020-05-06 NOTE — Unmapped (Signed)
Patient alert and oriented x4. VSS. RA. One assist to the bathroom. Prn Tramadol given as ordered for pain. Patient reported relief. Patient slept most of the shift. Low bed, and call light within reach. Patient is safe and remained free from falls. Will continue with plan of care.     Problem: Adult Inpatient Plan of Care  Goal: Plan of Care Review  Outcome: Progressing  Flowsheets (Taken 05/05/2020 1803)  Progress: improving  Plan of Care Reviewed With: patient  Goal: Patient-Specific Goal (Individualized)  Outcome: Progressing  Flowsheets (Taken 05/05/2020 1803)  Patient-Specific Goals (Include Timeframe): Pateint will remain free from falls during the shift.  Individualized Care Needs: lactulose, falls/safety, monitor VS  Anxieties, Fears or Concerns: Pain management  Goal: Absence of Hospital-Acquired Illness or Injury  Outcome: Progressing  Intervention: Identify and Manage Fall Risk  Recent Flowsheet Documentation  Taken 05/05/2020 0800 by Anna Genre, RN  Safety Interventions:   fall reduction program maintained   low bed  Intervention: Prevent and Manage VTE (Venous Thromboembolism) Risk  Recent Flowsheet Documentation  Taken 05/05/2020 1803 by Anna Genre, RN  Activity Management: activity adjusted per tolerance  Intervention: Prevent Infection  Recent Flowsheet Documentation  Taken 05/05/2020 1803 by Anna Genre, RN  Infection Prevention:   hand hygiene promoted   equipment surfaces disinfected   rest/sleep promoted  Goal: Optimal Comfort and Wellbeing  Outcome: Progressing  Intervention: Monitor Pain and Promote Comfort  Recent Flowsheet Documentation  Taken 05/05/2020 1803 by Anna Genre, RN  Pain Management Interventions: medication offered  Intervention: Provide Person-Centered Care  Flowsheets (Taken 05/05/2020 1803)  Trust Relationship/Rapport:   care explained   choices provided   emotional support provided   empathic listening provided  Goal: Readiness for Transition of Care  Outcome: Progressing  Goal: Rounds/Family Conference  Outcome: Progressing     Problem: Self-Care Deficit  Goal: Improved Ability to Complete Activities of Daily Living  Outcome: Progressing     Problem: Fall Injury Risk  Goal: Absence of Fall and Fall-Related Injury  Outcome: Progressing  Intervention: Identify and Manage Contributors  Recent Flowsheet Documentation  Taken 05/05/2020 1803 by Anna Genre, RN  Medication Review/Management: medications reviewed  Intervention: Promote Injury-Free Environment  Recent Flowsheet Documentation  Taken 05/05/2020 0800 by Anna Genre, RN  Safety Interventions:   fall reduction program maintained   low bed     Problem: Impaired Wound Healing  Goal: Optimal Wound Healing  Outcome: Progressing  Intervention: Promote Wound Healing  Flowsheets (Taken 05/05/2020 1803)  Activity Management: activity adjusted per tolerance  Pain Management Interventions: medication offered     Problem: Inadequate Tissue Perfusion (Sickle Cell Disease)  Goal: Effective Tissue Perfusion  Outcome: Progressing  Intervention: Maintain Adequate Tissue Perfusion  Flowsheets (Taken 05/05/2020 1803)  Pressure Reduction Devices: pressure-redistributing mattress utilized     Problem: Infection (Sickle Cell Disease)  Goal: Absence of Infection Signs and Symptoms  Outcome: Progressing  Intervention: Prevent or Manage Infection  Flowsheets (Taken 05/05/2020 1803)  Infection Management: aseptic technique maintained  Fever Reduction/Comfort Measures: lightweight clothing  Infection Prevention:   hand hygiene promoted   equipment surfaces disinfected   rest/sleep promoted     Problem: Pain (Sickle Cell Disease)  Goal: Acceptable Pain Control  Outcome: Progressing  Intervention: Manage Acute Pain  Flowsheets (Taken 05/05/2020 1803)  Pain Management Interventions: medication offered  Intervention: Acknowledge Underlying Chronic Pain  Flowsheets (Taken 05/05/2020 1803)  Medication Review/Management: medications reviewed

## 2020-05-06 NOTE — Unmapped (Signed)
VENOUS ACCESS ULTRASOUND PROCEDURE NOTE    Indications:   Poor venous access.    The Venous Access Team has assessed this patient for the placement of a PIV. Ultrasound guidance was necessary to obtain access.     Procedure Details:  Identity of the patient was confirmed via name, medical record number and date of birth. The availability of the correct equipment was verified.    The vein was identified for ultrasound catheter insertion.  Field was prepared with necessary supplies and equipment.  Probe cover and sterile gel utilized.  Insertion site was prepped with chlorhexidine solution and allowed to dry.  The catheter extension was primed with normal saline.A(n) 22 g x 1.75 inch catheter was placed in the right forearm with 1 attempt(s). See LDA for additional details.    Catheter aspirated, 2 mL blood return present. The catheter was then flushed with 5 mL of normal saline. Insertion site cleansed, and dressing applied per manufacturer guidelines. The catheter was inserted with difficulty due to poor vasculature  by Fredderick Erb RN.      RN was notified.     Thank you,     Fredderick Erb RN Venous Access Team   [phone]     Workup / Procedure Time:  30 minutes

## 2020-05-06 NOTE — Unmapped (Signed)
Pt transferred to BICU intermediate status following Rapid Response Team call..    Neuro: Pt drowsy calm, PRN's per MAR.   Cardio: Hypotensive and hypothermic - MD's aware and at bedside.   Resp: Pt on 4L [place].  GI/GU: No PO intake. BM x 0, No UOP.   Lines: PIV in place x2, flushes well.     Pt transitioned to comfort measures only following family meeting.  [facility] notified.     Safety precautions in place.         Problem: Adult Inpatient Plan of Care  Goal: Plan of Care Review  Outcome: Progressing  Goal: Patient-Specific Goal (Individualized)  Outcome: Progressing  Goal: Absence of Hospital-Acquired Illness or Injury  Outcome: Progressing  Intervention: Identify and Manage Fall Risk  Recent Flowsheet Documentation  Taken 04/28/2020 1200 by Alvina Filbert, RN  Safety Interventions:   aspiration precautions   low bed  Intervention: Prevent Infection  Recent Flowsheet Documentation  Taken 05/18/2020 1200 by Alvina Filbert, RN  Infection Prevention: cohorting utilized  Goal: Optimal Comfort and Wellbeing  Outcome: Progressing  Goal: Readiness for Transition of Care  Outcome: Progressing  Goal: Rounds/Family Conference  Outcome: Progressing     Problem: Self-Care Deficit  Goal: Improved Ability to Complete Activities of Daily Living  Outcome: Progressing     Problem: Fall Injury Risk  Goal: Absence of Fall and Fall-Related Injury  Outcome: Progressing  Intervention: Promote Injury-Free Environment  Recent Flowsheet Documentation  Taken 05/07/2020 1200 by Alvina Filbert, RN  Safety Interventions:   aspiration precautions   low bed     Problem: Impaired Wound Healing  Goal: Optimal Wound Healing  Outcome: Progressing     Problem: Inadequate Tissue Perfusion (Sickle Cell Disease)  Goal: Effective Tissue Perfusion  Outcome: Progressing     Problem: Infection (Sickle Cell Disease)  Goal: Absence of Infection Signs and Symptoms  Outcome: Progressing  Intervention: Prevent or Manage Infection  Recent Flowsheet Documentation  Taken 04/23/2020 1200 by Alvina Filbert, RN  Infection Management: aseptic technique maintained  Infection Prevention: cohorting utilized     Problem: Pain (Sickle Cell Disease)  Goal: Acceptable Pain Control  Outcome: Progressing

## 2020-05-06 NOTE — Unmapped (Signed)
Pt alert and oriented x4. VSS with no sign of seizures noted during the shift. Pt was able to tolerate medications without complication. Pt c/o pain which was relieved with prn mediation. Pt was taken for MRI during the shift. Pt bed remains in lowest position bed alarm activated. Will continue to monitor.  Problem: Adult Inpatient Plan of Care  Goal: Plan of Care Review  Outcome: Progressing  Goal: Patient-Specific Goal (Individualized)  Outcome: Progressing  Flowsheets (Taken 04/24/2020 0246)  Patient-Specific Goals (Include Timeframe): Pt will remain free from falls during the shift  Individualized Care Needs: falls,safety and seizure precautions.  Anxieties, Fears or Concerns: Pt concerned about pain management  Goal: Absence of Hospital-Acquired Illness or Injury  Outcome: Progressing  Goal: Optimal Comfort and Wellbeing  Outcome: Progressing  Intervention: Monitor Pain and Promote Comfort  Recent Flowsheet Documentation  Taken 04/23/2020 0246 by Sherril Croon, RN  Pain Management Interventions: pain management plan reviewed with patient/caregiver  Goal: Readiness for Transition of Care  Outcome: Progressing  Intervention: Mutually Develop Transition Plan  Flowsheets (Taken 04/23/2020 0246)  Concerns to be Addressed: adjustment to diagnosis/illness  Goal: Rounds/Family Conference  Outcome: Progressing     Problem: Self-Care Deficit  Goal: Improved Ability to Complete Activities of Daily Living  Outcome: Progressing  Intervention: Promote Activity and Functional Independence  Flowsheets (Taken 04/25/2020 0246)  Activity Assistance Provided: assistance, 1 person  Self-Care Promotion: BADL personal objects within reach     Problem: Fall Injury Risk  Goal: Absence of Fall and Fall-Related Injury  Outcome: Progressing  Intervention: Identify and Manage Contributors  Recent Flowsheet Documentation  Taken 05/16/2020 0246 by Sherril Croon, RN  Self-Care Promotion: BADL personal objects within reach     Problem: Impaired Wound Healing  Goal: Optimal Wound Healing  Outcome: Progressing  Intervention: Promote Wound Healing  Recent Flowsheet Documentation  Taken 04/23/2020 0246 by Sherril Croon, RN  Pain Management Interventions: pain management plan reviewed with patient/caregiver     Problem: Inadequate Tissue Perfusion (Sickle Cell Disease)  Goal: Effective Tissue Perfusion  Outcome: Progressing     Problem: Infection (Sickle Cell Disease)  Goal: Absence of Infection Signs and Symptoms  Outcome: Progressing  Intervention: Prevent or Manage Infection  Flowsheets (Taken 04/27/2020 0246)  Infection Management: aseptic technique maintained     Problem: Pain (Sickle Cell Disease)  Goal: Acceptable Pain Control  Outcome: Progressing  Intervention: Manage Acute Pain  Flowsheets (Taken 05/20/2020 0246)  Pain Management Interventions: pain management plan reviewed with patient/caregiver

## 2020-05-06 NOTE — Unmapped (Signed)
Internal Medicine (MEDU) Progress Note    Assessment & Plan:   [name] [name] is a 49 y.o. male with sickle cell anemia c/b congestive hepatopathy, CNS disease (cognitive slowing and prior seizures), pHTN that presented to [facility] with several weeks of decompensation and was found to have significant anemia below his baseline as well as hepatic encephalopathy and liver dysfunction likely due to his sickle cell disease.      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 *      Volume Overload I Cholestatic Hepatopathy I Hepatic Encephalopathy  Bilirubin continues to rise. MRCP without evidence of intervenable cause of hepatic failure. Liver failure almost certainly caused by hepatic sequestration secondary to sickle cell.   - 2gm sodium diet  - continue lasix 40mg  daily   - trend INR (per heme rec's below, will do every other day)  - continue home lactulose, titrate to 2-5 BM daily  ??  Sickle Cell Anemia, Hgb SS   Hgb 4.1 today. Per discussion with hematology yesterday, no further treatment is likely to benefit patient from a sickle cell standpoint. Risk of blood transfusion likely to outweigh benefit, certainly in the longer-term.   - continue folic acid +/- home voxelotor (do not have in hospital) 500mg  daily   - supplement 10mg  PO Vitamin K x3 days  - every other day CBC, reticulocytes, LDH, CMP, D Bili, Haptoglobin - collect in pediatric tubes to minimize blood loss  - EPO 15,000 units daily until discharge then resume 40,000 daily  - enoxaparin ppx 40mg  daily ok  - DO NOT TRANSFUSE BLOOD    Oliguric AKI I Sickle Cell Nephropathy I RTA w/ Hyperkalemia I Intolerance of ACEi/ARB  Today's labs pending. K has been stable and Cr down-trending.  - strict I/O   - daily weights  - no spironolactone   - CMP every other day  ??  Generalized Weakness I Failure to Thrive  PT recommendation 5x/low. Will discuss possibility of SNF vs home +/- hospice at family meeting today.  - PT/OT  - Palliative vs Hospice?  ??    Chronic Problems:  Hx of seizures and cognitive slowing 2/2 SCD   Free phenytoin level still slightly elevated. Dosing adjusted by pharmacy. Unable to get reliable Carbamazepine level due to elevated bili. Will monitor for clinical evidence of toxicity and adjust as necessary.   - Phenytoin and Carbamazepine per pharmacy rec's      Daily Checklist:  Diet: Regular Diet  DVT PPx: Lovenox 40mg  q24h  Electrolytes: No Repletion Needed  Code Status: Full Code  Dispo: continue floor care    Team Contact Information:   Primary Team: Internal Medicine (MEDU)  Primary Resident: Ivonne Andrew, MD  Resident's Pager: [phone] (Gen MedU Intern - Cliffton Asters)    Interval History:   Overnight, the patient had abdominal pain, several  BM.     Objective:   Temp:  [36 ??C-36.2 ??C] 36.2 ??C  Heart Rate:  [78-81] 78  Resp:  [18] 18  BP: (109-118)/(55-58) 109/55  SpO2:  [90 %-91 %] 91 %    Gen: chronically ill appearing M  Eyes: scleral icterus present  HENT: atraumatic, dry cracked lips  Lungs: breathing comfortably on RA, no use of accessory muscles, no cough      Labs/Studies: Labs and Studies from the last 24hrs per EMR and Reviewed      Alwyn Pea, MD   Neurology, PGY-1

## 2020-05-06 NOTE — Unmapped (Cosign Needed)
Hematology Consult Follow-Up Note    Requesting Attending Physician :  Earna Coder, MD  Service Requesting Consult : Med General Dor[facility] Counter (MDU)  Reason for Consult: co-management of HgB SS disease   Primary Hematologist: Dr Nigel Berthold    Assessment: [name] [name] is 49 y.o. with HgB SS disease, decompensated congestive hepatopathy, pulmonary HTN, ascites who was admitted for worsening ascites and lower extremity edema. Hematology was consulted to optimize his sickle cell disease in the setting of worsening hepatic function. He had an acute decompensation today and is now comfort care.     No further recommendations at this time.     This patient has been seen and discussed with Dr. Janie Morning. These recommendations were discussed with the primary team.     Please contact the hematology fellow at [phone] with any further questions.    [name], MD  Hematology-Oncology Fellow    -------------------------------------------------------------    HPI: [name] [name] is a 49 y.o. man with HgB SS disease, liver disease, epilepsy, and pulmonary HTN who was admitted in 9/9 with worsening abdominal distention, bilateral LE edema, and DOE. He is being seen at the request of Earna Coder, MD for co-management of his HgB SS disease.     [name] reports that started 3-4 weeks ago he then started developing leg swelling but without pain. Gradually his face also started to swell and it became harder for him to breathe prompting im to come to the ED for evaluation. He reports that sometimes his abdomen hurts in the right upper quadrant and has noticed that his urine looks more tea colored.     Interval events: Developed worsening mental status, hypothermia, and worsening abdominal pain. He was started on broad spectrum antibiotics and transferred to the ICU. HgB continues to be downtrending 4.5->4.1. With rising nRBC (23->34), retic (199.3-> 234.3), bilirubin (31.3->34.6->42.5), and Cr (0.73-> 1.23).     We discussed the possibility of pRBC transfusion in this emergent situation, however, he is at risk for hyper-hemolysis and also further destruction of his own RBCs. While transfusion may be a good temporizing measuring, it will not change the underlying damage caused by his disease. Unfortunately, at this time, there are no available options that would provide timely, meaning change, in regards to how quickly his liver is failing. After discussion with the primary team, [name] family has decided to make him comfortable.      We met with the family in the afternoon and answered their questions.     Review of Systems: Review of Systems - a 12 point ROS is negative unless stated in the HPI    Allergies: is allergic to ace inhibitors.    Medications:   Meds:    Continuous Infusions:  PRN Meds:.acetaminophen, glycopyrrolate, haloperidol lactate, HYDROmorphone **OR** HYDROmorphone, HYDROmorphone **OR** HYDROmorphone, HYDROmorphone **OR** HYDROmorphone, LORazepam **OR** LORazepam    Objective:   Vitals: Temp:  [33.5 ??C (92.3 ??F)-36.2 ??C (97.1 ??F)] 34.4 ??C (93.9 ??F)  Heart Rate:  [53-78] 68  SpO2 Pulse:  [52-73] 68  Resp:  [18-20] 20  BP: (87-158)/(10-78) 87/25  MAP (mmHg):  [39-79] 47  FiO2 (%):  [50 %] 50 %  SpO2:  [89 %-98 %] 93 %    Physical Exam:  BP 87/25  - Pulse 68  - Temp (!) 34.4 ??C (93.9 ??F)  - Resp 20  - Ht 167.6 cm (5' 5.98)  - Wt 71.4 kg (157 lb 8 oz)  - SpO2 93%  -  BMI 25.43 kg/m??    General appearance - sleepy, arouses, but very slow to speak.  Nose - normal and patent, no erythema, discharge or polyps   Mouth - mucous membranes moist, pharynx normal without lesions   Pulmonary - shallow respirations    Cardiovascular- RRR, no MRG   Gastrointestinal - distended, taunt, tender abdomen  Extremities - pitting edema to the thighs   Skin - icteric, areas of hyperpigmentation noted on the lower legs      Test Results  Recent Labs     05/04/20  0732 05/04/2020  0644 04/30/2020  1109   WBC 10.1 10.1  --    HGB 4.5* 4.1* <5.0*   PLT 87* 90*  --        Imaging    US Liver Doppler 05/02/20  1. Patent hepatic vasculature with normal flow direction.  2. Chronic liver disease with findings of cirrhosis and small volume ascites.  3. Horseshoe kidney.  4. Poorly evaluated gallbladder with suggestion of wall thickening, likely secondary to underlying hepatic dysfunction.

## 2020-05-06 NOTE — Unmapped (Signed)
Advance Care Planning     Participants: [name] [name] [facility] Resident), [name] [name] (patient's mom)    Discussion/Action: Rapid Response called for altered mental status. At the bedside [name] was drowsy but AAOx3. As he was undergoing work-up I contacted [name] [name] who is the patient's mother and health care decision maker. We had spoken on the phone in the room with [name] [name] the day before and discussed that hematology does not have many more options for [name] [name] and that I anticipate that hepatology will not have much more to offer either. Today I discussed that [name] [name]'s declined mental status and labs show [place] that he is not doing well and as we discussed the day previously, he does not have options in regards to improving long term options. Transfusion is also extremely risky and potentially life threatening meaning we do not have many short term solutions either. I assured [name] [name] that we are doing everything else that we can to try to stabilize [name] [name] right now, but that if his heart were to stop or if he were to stop breathing I do not think that chest compressions or a breathing tube would change his long term outcome. [name] [name] expressed that she does not want [name] [name] to be in pain and said that she feels he would not want compressions or a breathing tube either.   - code status was changed to DNR/DNI  - other pharmaceutical interventions for stabilization and work-up were continued as part of the rapid response     Health Care Decision Maker as of 04/28/2020    HCDM (patient stated preference): [name] [name] - Mother - [phone]

## 2020-05-07 LAB — ZINC: Zinc:MCnc:Pt:Ser/Plas:Qn:: 0.45 — ABNORMAL LOW

## 2020-05-07 NOTE — Unmapped (Signed)
Physician Discharge Summary [facility]  BICU [facility]  [address]  [place] [address]  Dept: [phone]  Loc: [phone]     Identifying Information:   [name] [name]  June 08, 1971  [id number]    Primary Care Physician: Fredirick Maudlin, MD   Code Status: DNR and DNI    Admit Date: 04/26/2020    Discharge Date: Jun 03, 2020     Discharge To: Deceased -  [name] [name] had a pronouncement of death 06-03-20 and the time of death is 20:15 . The pronouncement of death was made by Meyer Russel, MD.   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  The parties present at time of death was/were physicians. The patient's HCPOA was notified of the death. This case was not referred to the medical examiner. An autopsy was declined.    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 *    Hospital Course:   [name] [name]??is a 49 y.o.??male??with sickle cell anemia c/b congestive hepatopathy, CNS disease??(cognitive??slowing??and??prior??seizures),??pHTN??that presented to [facility]??with several weeks of decompensation and was found to have significant anemia below his baseline as well as hepatic encephalopathy and liver dysfunction likely due to his sickle cell disease.????  ??  Over the course of his hospitalization his hemoglobin continued to drop despite restarting EPO per hematology's recommendation. Blood transfusion options were discussed by hematology, but due to presence of antibodies he was at high risk for hyper-hemolysis.   ??  A work-up of his liver dysfunction was initiated by hepatology and there was not found to be an intervenable cause for his liver dysfunction. Most likely cause was sequela of his sickle cell anemia.   ??  On June 04, 2023 patient was found to have an acute mental status change as well as hypothermia and hypoglycemia. Sepsis vs fulminant liver failure were considered. The risks of treating for sepsis with fluids was weighed against the risk of causing further dilution and worsening his already severe anemia. Moreover he was found to have a severe lactic acidosis which could only be potentially corrected with fluids as well as a new acute hyperkalemia with EKG changes which would have required serial blood draws and fluids for correction. These problems were presented to family during a goals of care discussions and a decision was made to focus on comfort care. Patient deceased on June 03, 2020 at 20:15.    Procedures:  No admission procedures for hospital encounter.  ______________________________________________________________________  Discharge Medications:     Your Medication List      ASK your doctor about these medications    BD TUBERCULIN SYRINGE 1 mL 27 x 1/2 Syrg  Generic drug: syringe with needle  Use as directed twice weekly     carBAMazepine 400 MG 12 hr tablet  Commonly known as: TEGretol  XR  Take 1 tablet in the morning and 2 tablets in the evening     diclofenac sodium 1 % gel  Commonly known as: VOLTAREN  Apply 2 g topically daily as needed for arthritis.     empty container Misc  Commonly known as: sharps container  use as directed     folic acid 1 MG tablet  Commonly known as: FOLVITE  TAKE 1 TABLET BY MOUTH EVERY DAY     furosemide 20 MG tablet  Commonly known as: LASIX  TAKE 2 TABLETS BY MOUTH EVERY DAY     lactulose 10 gram/15 mL solution  Commonly known as: CONSTULOSE  Take 45 mL (30 g total) by mouth Three (3) times a day.     oxyCODONE 5 MG immediate release tablet  Commonly known as: ROXICODONE  Take 1 tablet (5 mg total) by mouth every six (6) hours as needed for pain.     OXYGEN-AIR DELIVERY SYSTEMS MISC  2 L/min by Each Nare route nightly. [employer]     phenytoin 100 MG ER capsule  Commonly known as: DILANTIN  Take 1 capsule in the morning and 2 capsules in the evening     PROCRIT 40,000 unit/mL injection  Generic drug: epoetin alfa  Inject 1 mL (40,000 Units total) under the skin Two (2) times a week.     sodium polystyrene 15 gram/60 mL Susp  Generic drug: sodium polystyrene sulfonate  Take 15 g by mouth daily.     VITAMIN D3-10 mcg (400 unit) 10 mcg (400 unit) Cap  Generic drug: cholecalciferol (vitamin D3-10 mcg (400 unit))  Take 1 capsule (400 Units total) by mouth daily.     voxelotor 500 mg tablet  Commonly known as: OXBRYTA  Take 1 tablet (500 mg total) by mouth daily.            Allergies:  Ace inhibitors  ______________________________________________________________________  Pending Test Results (if blank, then none):  Pending Labs     Order Current Status    Zinc Level, Serum In process          Most Recent Labs:  All lab results last 24 hours -   Recent Results (from the past 24 hour(s))   Phenytoin Level, Free    Collection Time: 05/19/2020  4:46 AM   Result Value Ref Range    Phenytoin, Free 2.20 (H) 1 - 2 ug/mL   PT-INR    Collection Time: 04/29/2020  6:44 AM   Result Value Ref Range    PT 25.9 (H) 10.5 - 13.5 sec    INR 2.26    Bilirubin, Direct    Collection Time: 04/24/2020  6:44 AM   Result Value Ref Range    Bilirubin, Direct >22.50 (H) 0.00 - 0.30 mg/dL   CBC    Collection Time: 05/17/2020  6:44 AM   Result Value Ref Range    WBC 10.1 4.5 - 11.0 10*9/L    RBC 1.04 (L) 4.50 - 5.90 10*12/L    HGB 4.1 (LL) 13.5 - 17.5 g/dL    HCT 16.1 (LL) 09.6 - 53.0 %    MCV 121.7 (H) 80.0 -  100.0 fL    MCH 38.2 (H) 26.0 - 34.0 pg    MCHC 32.2 31.0 - 37.0 g/dL    RDW 16.1 (H) 09.6 - 15.0 %    MPV 9.3 7.0 - 10.0 fL    Platelet 90 (L) 150 - 440 10*9/L    nRBC 34 (H) <=4 /100 WBCs    Results Verified by Slide Scan Slide Reviewed    Comprehensive Metabolic Panel    Collection Time: 04/26/2020  6:44 AM   Result Value Ref Range    Sodium 131 (L) 135 - 145 mmol/L    Potassium 5.8 (H) 3.5 - 5.1 mmol/L    Chloride 103 98 - 107 mmol/L    Anion Gap      CO2      BUN      Creatinine 1.23 (H) 0.60 - 1.10 mg/dL    EGFR CKD-EPI Non-African American, Male 89 >=60 mL/min/1.54m2    EGFR CKD-EPI African American, Male 13 >=60 mL/min/1.57m2    Glucose      Calcium      Albumin 1.1 (L) 3.4 - 5.0 g/dL    Total Protein      Total Bilirubin 42.5 (HH) 0.3 - 1.2 mg/dL    AST 96 (H) <=04 U/L    ALT 25 10 - 49 U/L    Alkaline Phosphatase 232 (H) 46 - 116 U/L   Reticulocytes    Collection Time: 05/21/2020  6:44 AM   Result Value Ref Range    Reticulocyte Manual % 21.9 (H) 0.8 - 2.0 %    Absolute Manual Reticulocyte 234.3 (H) 27.0 - 120.0 10*9/L   POCT Glucose    Collection Time: 05/05/2020 10:44 AM   Result Value Ref Range    Glucose, POC 30 (LL) 70 - 179 mg/dL   ECG 12 Lead    Collection Time: 05/15/2020 10:51 AM   Result Value Ref Range    EKG Systolic BP  mmHg    EKG Diastolic BP  mmHg    EKG Ventricular Rate 60 BPM    EKG Atrial Rate  BPM    EKG P-R Interval  ms    EKG QRS Duration 250 ms    EKG Q-T Interval 596 ms    EKG QTC Calculation 596 ms    EKG Calculated P Axis  degrees    EKG Calculated R Axis -56 degrees    EKG Calculated T Axis 95 degrees    QTC Fredericia 596 ms   POCT Glucose    Collection Time: 05/03/2020 10:57 AM   Result Value Ref Range    Glucose, POC 150 70 - 179 mg/dL Carboxyhemoglobin/POC    Collection Time: 04/28/2020 11:09 AM   Result Value Ref Range    Carboxyhemoglobin 5.5 (H) <1.2 %   GLUCOSE-BG/POC    Collection Time: 05/21/2020 11:09 AM   Result Value Ref Range    Glucose Whole Blood 150 70 - 179 mg/dL   POCT Arterial Blood Gas    Collection Time: 04/28/2020 11:09 AM   Result Value Ref Range    pH, Arterial 7.14 (LL) 7.35 - 7.45    pCO2, Arterial 27 (L) 35 - 45 mm[Hg]    pO2, Arterial 144 (H) 80 - 110 mm[Hg]    HCO3 (Bicarbonate), Arterial 9.3 (L) 22.0 - 27.0 mmol/L    Base Excess, Arterial -19.8 (L) -2.0 - 2.0 mmol/L    O2 Sat, Arterial 99.0 94.0 - 100.0 %    Specimen Type, Arterial Arterial  FIO2 50 %   HEMOGLOBIN BG/POC    Collection Time: 05-09-2020 11:09 AM   Result Value Ref Range    Hemoglobin <5.0 (LL) 13.5 - 17.5 g/dL   POCT Ionized Calcium    Collection Time: 09-May-2020 11:09 AM   Result Value Ref Range    Ionized Calcium 6.0 (H) 4.4 - 5.4 mg/dL   POTASSIUM-BG/POC    Collection Time: 05-09-20 11:09 AM   Result Value Ref Range    Potassium, Bld 5.5 (H) 3.4 - 4.6 mmol/L   POCT Lactic Acid (Lactate), Arterial    Collection Time: 05/09/20 11:09 AM   Result Value Ref Range    Lactate, Arterial 10.5 (HH) <1.3 mmol/L   METHEMOGLOBIN/POC    Collection Time: May 09, 2020 11:09 AM   Result Value Ref Range    Methemoglobin <1.0 <1.5 %   SODIUM-BG/POC    Collection Time: 05-09-20 11:09 AM   Result Value Ref Range    Sodium Whole Blood 135 135 - 145 mmol/L   POCT Arterial Oxyhemoglobin    Collection Time: 05/09/2020 11:09 AM   Result Value Ref Range    Oxyhemoglobin 92.6 (L) 94.0 - 100.0 %   ECG 12 Lead    Collection Time: 05/09/20 11:45 AM   Result Value Ref Range    EKG Systolic BP  mmHg    EKG Diastolic BP  mmHg    EKG Ventricular Rate 68 BPM    EKG Atrial Rate 68 BPM    EKG P-R Interval 360 ms    EKG QRS Duration 212 ms    EKG Q-T Interval 530 ms    EKG QTC Calculation 563 ms    EKG Calculated P Axis -135 degrees    EKG Calculated R Axis -29 degrees    EKG Calculated T Axis 100 degrees    QTC Fredericia 552 ms       Relevant Studies/Radiology (if blank, then none):  ECG 12 Lead    Result Date: 05-09-20  UNUSUAL P AXIS, POSSIBLE ECTOPIC ATRIAL RHYTHM INTRAVENTRICULAR CONDUCTION DELAY MINIMAL VOLTAGE CRITERIA FOR LVH, MAY BE NORMAL VARIANT ( [employer] product ) ABNORMAL ECG WHEN COMPARED WITH ECG OF May 09, 2020 10:51, ECTOPIC ATRIAL RHYTHM HAS REPLACED WIDE QRS RHYTHM    ECG 12 Lead    Result Date: 05/09/20  WIDE QRS RHYTHM RIGHT BUNDLE BRANCH BLOCK LEFT ANTERIOR FASCICULAR BLOCK **BIFASCICULAR BLOCK** T WAVE ABNORMALITY, CONSIDER LATERAL ISCHEMIA ABNORMAL ECG WHEN COMPARED WITH ECG OF 01-May-2020 12:44, WIDE QRS RHYTHM HAS REPLACED SINUS RHYTHM    XR Chest Portable    Result Date: 2020/05/09  EXAM: XR CHEST PORTABLE DATE: 05-09-20 12:10 PM ACCESSION: [id number] UN DICTATED: 05/09/20 1:18 PM INTERPRETATION LOCATION: [facility]     CLINICAL INDICATION: 49 years old Male with ALTERED MENTAL STATUS      COMPARISON: 05/12/2020     TECHNIQUE: Portable Chest Radiograph.     FINDINGS:     Low lung volumes. Increased retrocardiac opacity. Increased linear bibasilar opacities. Stable left upper mediastinal opacity, compatible with soft tissue mass seen on prior CTA chest.     No pleural effusion or pneumothorax.     Stable cardiomediastinal silhouette.     Unchanged chronic right distal clavicular fracture.                 Low lung volumes with increased bibasilar and retrocardiac opacities, may represent atelectasis versus infection/aspiration in the appropriate clinical context.    MRI Abdomen Wo Contrast MRCP    Result Date: May 09, 2020  EXAM: MRI  abdomen without contrast, MRCP DATE: 05/05/2020 11:00 PM ACCESSION: [id number] UN DICTATED: 05/05/2020 11:51 PM INTERPRETATION LOCATION: [facility]     CLINICAL INDICATION: 49 year old Male with elevated bilirubin      COMPARISON: Liver Doppler ultrasound 05/02/2020, CTA chest 01/26/2020     TECHNIQUE: MRI of the abdomen was obtained without IV contrast. Multisequence, multiplanar images and dedicated T2 weighted images that highlight the biliary tree were obtained.     FINDINGS:     Evaluation of the intra-abdominal organs and vascular structures limited in the absence of IV contrast. Study is also markedly degraded due to motion artifact and  ascites.     LINES/DEVICES: None.     LOWER CHEST: Small left pleural effusion with adjacent atelectasis.     ABDOMEN:     HEPATOBILIARY: Nodular hepatic contour. Gallbladder moderately distended with several small stones at the fundus. No significant gallbladder wall thickening. Mild extrahepatic biliary ductal dilatation, measuring up to 0.7 cm. PANCREAS: Unremarkable. SPLEEN: Susceptibility artifact from dense splenic calcifications, similar to prior. ADRENAL GLANDS: Unremarkable. KIDNEYS/URETERS: Horseshoe kidney. BOWEL/PERITONEUM/RETROPERITONEUM: No bowel obstruction. No acute inflammatory process. Small-volume abdominopelvic ascites. VASCULATURE: Abdominal aorta within normal limits for patient's age. Unremarkable inferior vena cava. LYMPH NODES: No adenopathy.     BONES/SOFT TISSUES: Unremarkable.             --Degraded examination due to lack of intravenous contrast and motion. Moderate ascites also limits this examination. --Moderately distended gallbladder, concentrated bile/sludge and multiple subcentimeter gravel-type biliary stones. No definitive choledocholithiasis or bile duct dilatation, limited by factors detailed above.  --Morphologic changes of chronic liver disease. Stigmata of portal hypertension including moderate volume abdominal ascites. --Additional chronic and incidental findings as noted above.    ______________________________________________________________________  Discharge Instructions:                        Appointments which have been scheduled for you    Jun 12, 2020  3:00 PM  (Arrive by 2:45 PM)  RETURN VIDEO - OTHER with Julious Oka, MD  [facility] [place] [facility]) [address]  [place] [address]  [phone]      Jun 13, 2020 10:30 AM  (Arrive by 10:15 AM)  RETURN  PULM HYPERTENSION with Zannie Cove, MD  [facility] SPECIAL[facility] [place] [facility] [place]) [address]  [place] [address]  [phone]           ______________________________________________________________________    Condition at Discharge: Deceased    Length of Discharge: I spent greater than 30 mins in the discharge of this patient.

## 2020-05-08 NOTE — Unmapped (Signed)
This patient has been disenrolled from the [facility] specialty pharmacy services due to patient is deceased.    [name] [name]  [facility] Specialty Pharmacist

## 2020-05-23 DEATH — deceased

## 2021-08-25 IMAGING — US US ABDOMEN LIMITED
1 series · 14 of 25 positions shown · non-contrast
Comparison: None.

CLINICAL DATA: Right upper quadrant pain beginning this morning.

EXAM:
ULTRASOUND ABDOMEN LIMITED RIGHT UPPER QUADRANT

[Series 1: us abdomen limited · 14 of 62 slices shown]
[im 1/62]
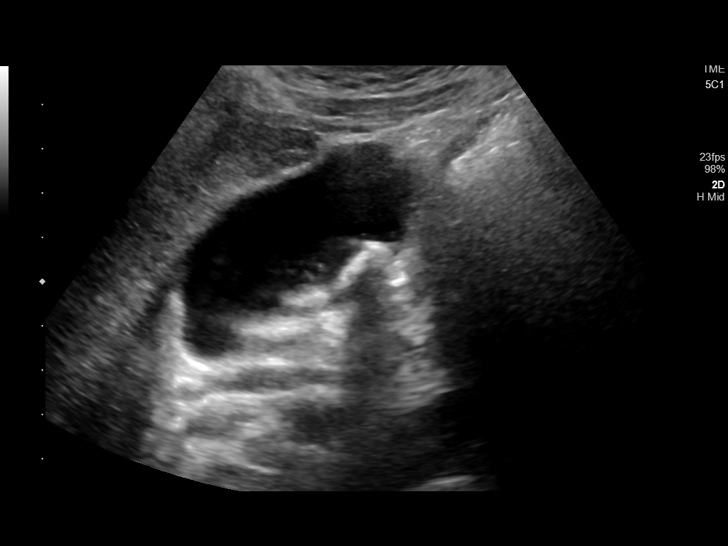
[im 6/62]
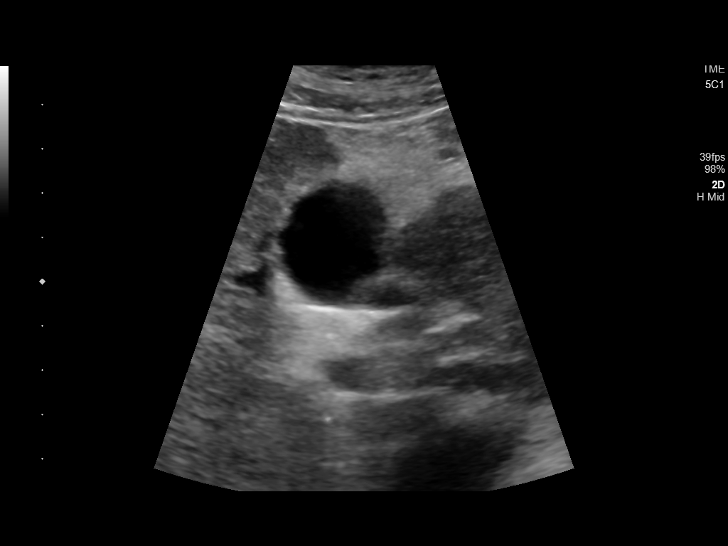
[im 11/62]
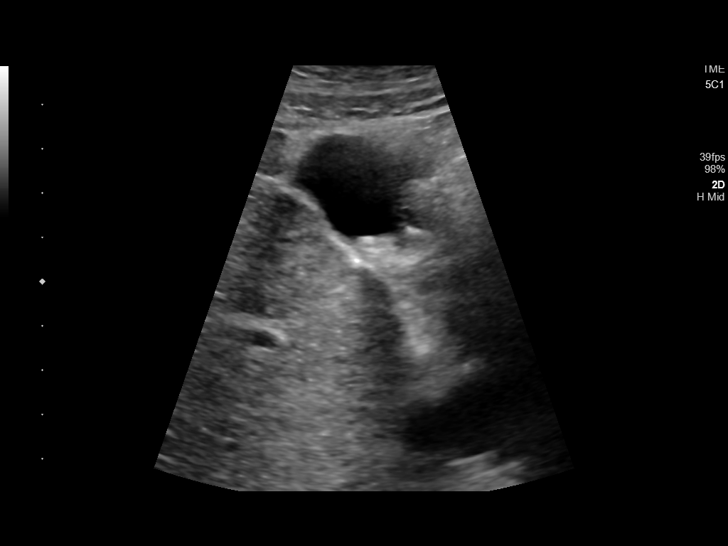
[im 16/62]
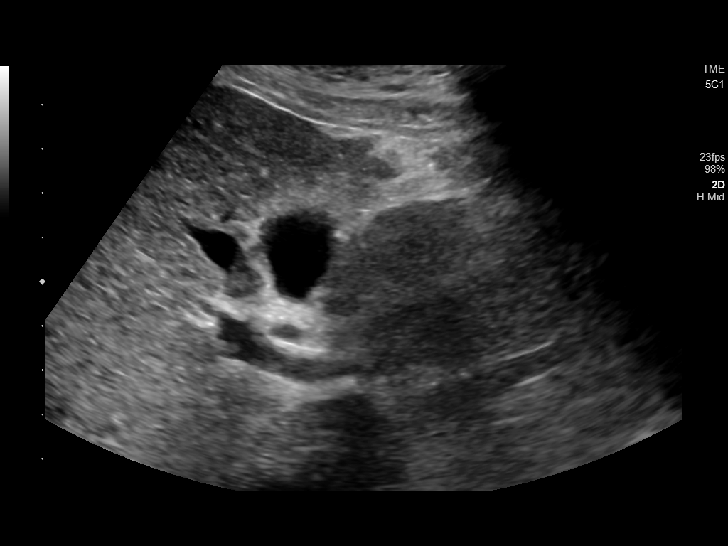
[im 21/62]
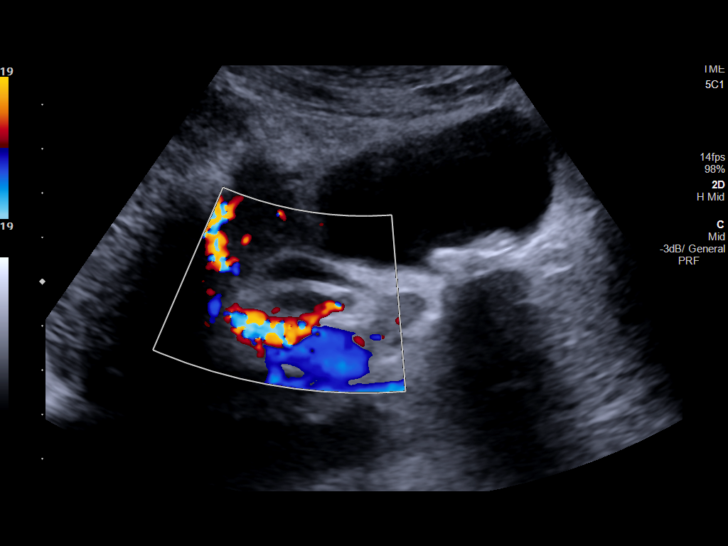
[im 23/62]
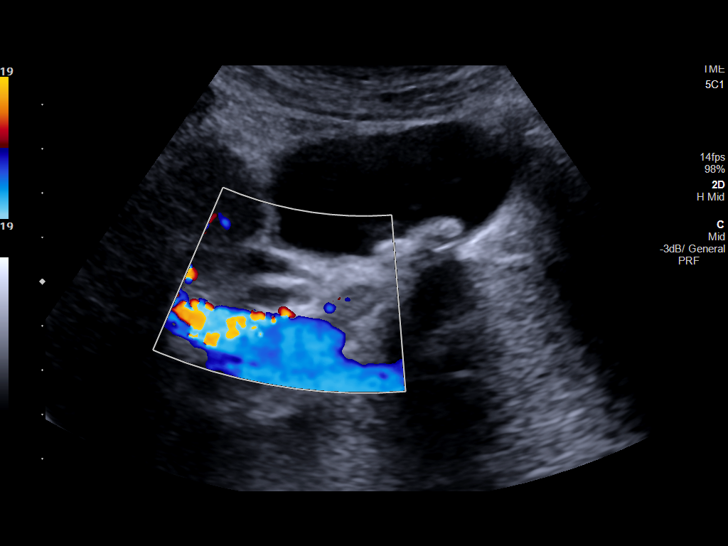
[im 28/62]
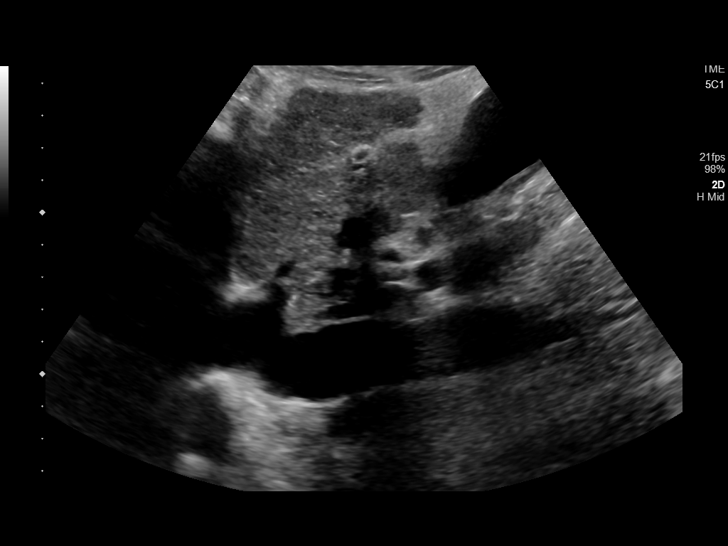
[im 34/62]
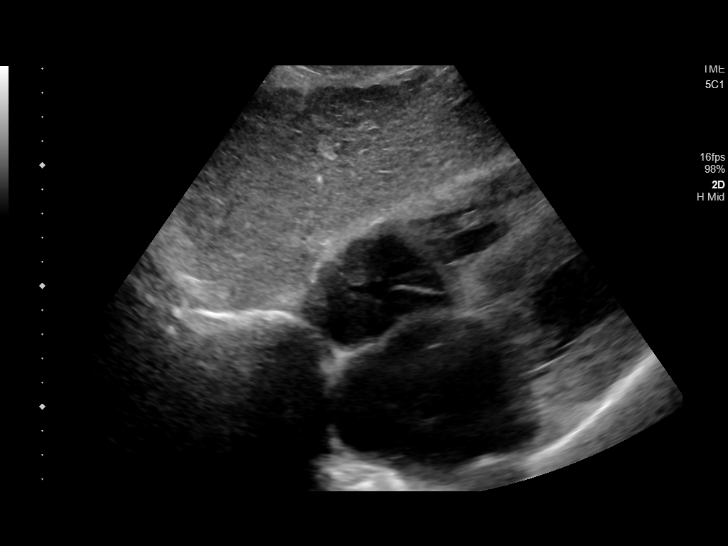
[im 39/62]
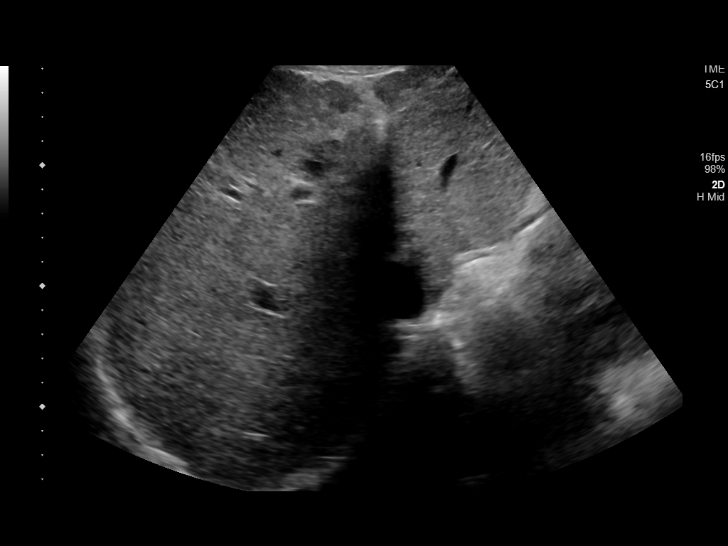
[im 41/62]
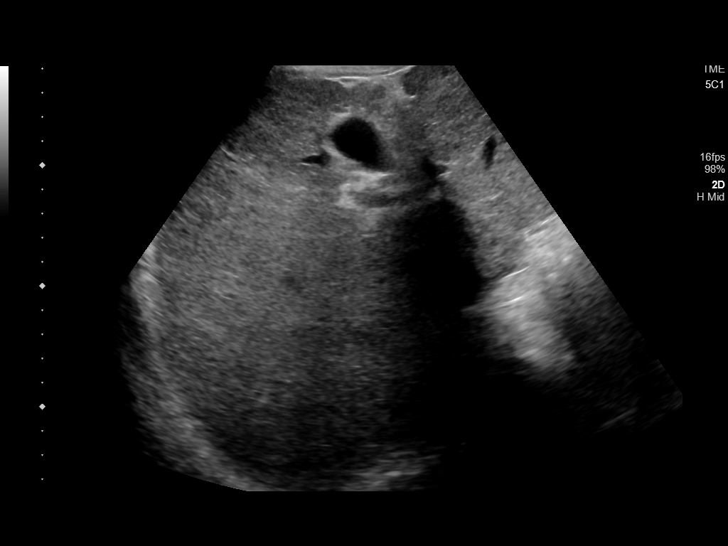
[im 46/62]
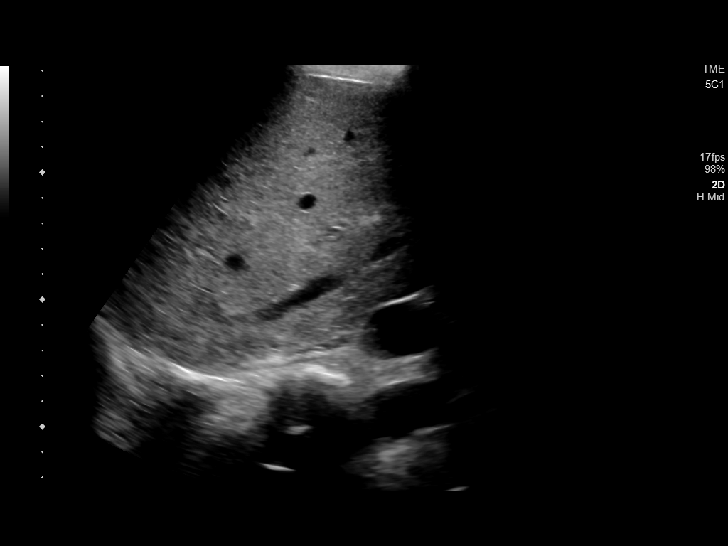
[im 51/62]
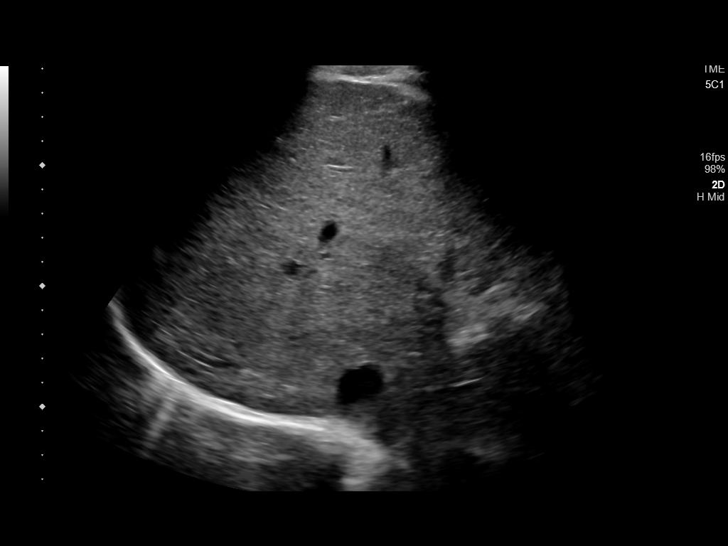
[im 56/62]
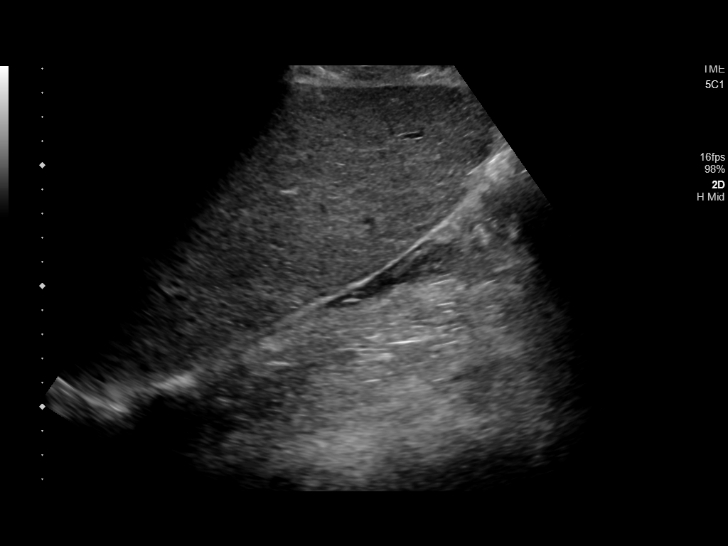
[im 62/62]
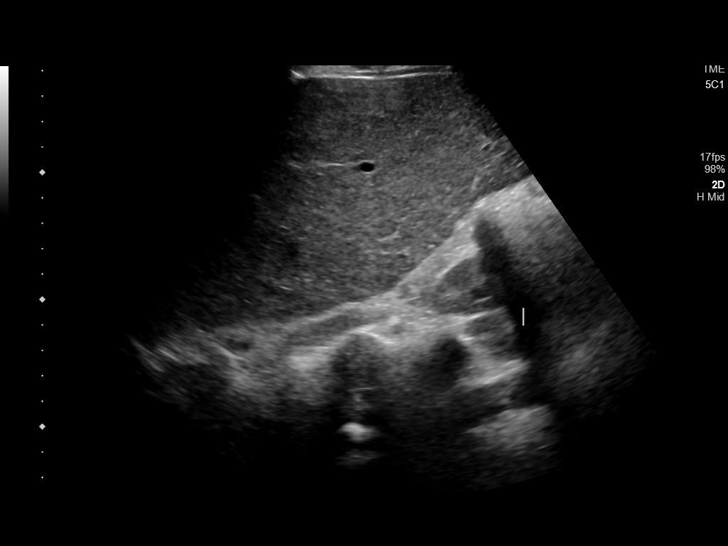

[14 of 25 positions shown; findings below may reference images not displayed]

FINDINGS: Gallbladder:

Moderate cholelithiasis with largest stone measuring 1.5 cm. No
significant gallbladder wall thickening or adjacent free fluid.
Negative sonographic Murphy sign.

Common bile duct:

Diameter: 5.0 mm.

Liver:

No focal lesion identified. Within normal limits in parenchymal
echogenicity. Portal vein is patent on color Doppler imaging with
normal direction of blood flow towards the liver.

Other: None.
IMPRESSION: Moderate cholelithiasis without additional sonographic evidence to
suggest acute cholecystitis.
